# Patient Record
Sex: Female | Born: 1950
Health system: Southern US, Community
[De-identification: ages and names within clinical notes are randomized; demographics above are authoritative.]

## PROBLEM LIST (undated history)

## (undated) DIAGNOSIS — M869 Osteomyelitis, unspecified: Secondary | ICD-10-CM

## (undated) DIAGNOSIS — I509 Heart failure, unspecified: Secondary | ICD-10-CM

## (undated) DIAGNOSIS — M79609 Pain in unspecified limb: Secondary | ICD-10-CM

## (undated) DIAGNOSIS — G2581 Restless legs syndrome: Secondary | ICD-10-CM

## (undated) DIAGNOSIS — D75839 Thrombocytosis, unspecified: Secondary | ICD-10-CM

## (undated) DIAGNOSIS — N189 Chronic kidney disease, unspecified: Secondary | ICD-10-CM

## (undated) DIAGNOSIS — I700 Atherosclerosis of aorta: Secondary | ICD-10-CM

## (undated) DIAGNOSIS — Z1589 Genetic susceptibility to other disease: Secondary | ICD-10-CM

## (undated) DIAGNOSIS — D473 Essential (hemorrhagic) thrombocythemia: Secondary | ICD-10-CM

## (undated) DIAGNOSIS — K5790 Diverticulosis of intestine, part unspecified, without perforation or abscess without bleeding: Secondary | ICD-10-CM

## (undated) DIAGNOSIS — D509 Iron deficiency anemia, unspecified: Secondary | ICD-10-CM

## (undated) DIAGNOSIS — E119 Type 2 diabetes mellitus without complications: Secondary | ICD-10-CM

## (undated) DIAGNOSIS — M1990 Unspecified osteoarthritis, unspecified site: Secondary | ICD-10-CM

## (undated) DIAGNOSIS — L409 Psoriasis, unspecified: Secondary | ICD-10-CM

## (undated) DIAGNOSIS — I8390 Asymptomatic varicose veins of unspecified lower extremity: Secondary | ICD-10-CM

## (undated) DIAGNOSIS — M797 Fibromyalgia: Secondary | ICD-10-CM

## (undated) DIAGNOSIS — I2510 Atherosclerotic heart disease of native coronary artery without angina pectoris: Secondary | ICD-10-CM

## (undated) DIAGNOSIS — I255 Ischemic cardiomyopathy: Secondary | ICD-10-CM

## (undated) DIAGNOSIS — G4700 Insomnia, unspecified: Secondary | ICD-10-CM

## (undated) DIAGNOSIS — Z87442 Personal history of urinary calculi: Secondary | ICD-10-CM

## (undated) DIAGNOSIS — I10 Essential (primary) hypertension: Secondary | ICD-10-CM

## (undated) HISTORY — DX: Insomnia, unspecified: G47.00

## (undated) HISTORY — PX: EYE SURGERY: SHX253

## (undated) HISTORY — PX: BACK SURGERY: SHX140

## (undated) HISTORY — DX: Thrombocytosis, unspecified: D75.839

## (undated) HISTORY — DX: Essential (primary) hypertension: I10

## (undated) HISTORY — DX: Diverticulosis of intestine, part unspecified, without perforation or abscess without bleeding: K57.90

## (undated) HISTORY — DX: Genetic susceptibility to other disease: Z15.89

## (undated) HISTORY — PX: COLONOSCOPY: SHX174

## (undated) HISTORY — DX: Unspecified osteoarthritis, unspecified site: M19.90

## (undated) HISTORY — DX: Atherosclerosis of aorta: I70.0

## (undated) HISTORY — DX: Asymptomatic varicose veins of unspecified lower extremity: I83.90

## (undated) HISTORY — DX: Restless legs syndrome: G25.81

## (undated) HISTORY — DX: Chronic kidney disease, unspecified: N18.9

## (undated) HISTORY — PX: FRACTURE SURGERY: SHX138

## (undated) HISTORY — DX: Iron deficiency anemia, unspecified: D50.9

## (undated) HISTORY — DX: Pain in unspecified limb: M79.609

## (undated) HISTORY — PX: TUBAL LIGATION: SHX77

## (undated) HISTORY — DX: Fibromyalgia: M79.7

## (undated) HISTORY — DX: Psoriasis, unspecified: L40.9

---

## 1984-03-20 HISTORY — PX: BACK SURGERY: SHX140

## 1997-10-15 ENCOUNTER — Emergency Department (HOSPITAL_COMMUNITY): Admission: EM | Admit: 1997-10-15 | Discharge: 1997-10-15 | Payer: Self-pay | Admitting: Emergency Medicine

## 1997-11-13 ENCOUNTER — Other Ambulatory Visit: Admission: RE | Admit: 1997-11-13 | Discharge: 1997-11-13 | Payer: Self-pay | Admitting: Obstetrics and Gynecology

## 1999-02-03 ENCOUNTER — Other Ambulatory Visit: Admission: RE | Admit: 1999-02-03 | Discharge: 1999-02-03 | Payer: Self-pay | Admitting: Obstetrics and Gynecology

## 1999-03-31 ENCOUNTER — Other Ambulatory Visit: Admission: RE | Admit: 1999-03-31 | Discharge: 1999-03-31 | Payer: Self-pay | Admitting: Gastroenterology

## 1999-03-31 ENCOUNTER — Encounter (INDEPENDENT_AMBULATORY_CARE_PROVIDER_SITE_OTHER): Payer: Self-pay | Admitting: Specialist

## 2000-03-01 ENCOUNTER — Other Ambulatory Visit: Admission: RE | Admit: 2000-03-01 | Discharge: 2000-03-01 | Payer: Self-pay | Admitting: Obstetrics and Gynecology

## 2000-09-13 ENCOUNTER — Encounter (INDEPENDENT_AMBULATORY_CARE_PROVIDER_SITE_OTHER): Payer: Self-pay | Admitting: *Deleted

## 2000-09-13 ENCOUNTER — Ambulatory Visit (HOSPITAL_BASED_OUTPATIENT_CLINIC_OR_DEPARTMENT_OTHER): Admission: RE | Admit: 2000-09-13 | Discharge: 2000-09-13 | Payer: Self-pay | Admitting: Orthopedic Surgery

## 2002-05-01 ENCOUNTER — Ambulatory Visit (HOSPITAL_COMMUNITY): Admission: RE | Admit: 2002-05-01 | Discharge: 2002-05-01 | Payer: Self-pay | Admitting: Urology

## 2002-05-01 ENCOUNTER — Encounter: Payer: Self-pay | Admitting: Urology

## 2003-01-26 ENCOUNTER — Other Ambulatory Visit: Admission: RE | Admit: 2003-01-26 | Discharge: 2003-01-26 | Payer: Self-pay | Admitting: Internal Medicine

## 2004-02-26 ENCOUNTER — Encounter: Admission: RE | Admit: 2004-02-26 | Discharge: 2004-02-26 | Payer: Self-pay

## 2004-10-11 ENCOUNTER — Ambulatory Visit: Payer: Self-pay | Admitting: Gastroenterology

## 2004-10-26 ENCOUNTER — Encounter (INDEPENDENT_AMBULATORY_CARE_PROVIDER_SITE_OTHER): Payer: Self-pay | Admitting: Specialist

## 2004-10-26 ENCOUNTER — Ambulatory Visit: Payer: Self-pay | Admitting: Gastroenterology

## 2005-08-08 ENCOUNTER — Emergency Department (HOSPITAL_COMMUNITY): Admission: EM | Admit: 2005-08-08 | Discharge: 2005-08-08 | Payer: Self-pay | Admitting: *Deleted

## 2009-08-10 ENCOUNTER — Encounter: Payer: Self-pay | Admitting: Internal Medicine

## 2009-11-17 ENCOUNTER — Encounter (INDEPENDENT_AMBULATORY_CARE_PROVIDER_SITE_OTHER): Payer: Self-pay | Admitting: *Deleted

## 2010-01-31 ENCOUNTER — Encounter (INDEPENDENT_AMBULATORY_CARE_PROVIDER_SITE_OTHER): Payer: Self-pay | Admitting: *Deleted

## 2010-03-09 ENCOUNTER — Encounter (INDEPENDENT_AMBULATORY_CARE_PROVIDER_SITE_OTHER): Payer: Self-pay | Admitting: *Deleted

## 2010-03-15 ENCOUNTER — Ambulatory Visit: Payer: Self-pay | Admitting: Gastroenterology

## 2010-03-20 DIAGNOSIS — K635 Polyp of colon: Secondary | ICD-10-CM

## 2010-03-20 HISTORY — DX: Polyp of colon: K63.5

## 2010-03-28 ENCOUNTER — Encounter: Payer: Self-pay | Admitting: Gastroenterology

## 2010-03-28 ENCOUNTER — Ambulatory Visit
Admission: RE | Admit: 2010-03-28 | Discharge: 2010-03-28 | Payer: Self-pay | Source: Home / Self Care | Attending: Gastroenterology | Admitting: Gastroenterology

## 2010-04-01 ENCOUNTER — Encounter: Payer: Self-pay | Admitting: Gastroenterology

## 2010-04-19 NOTE — Letter (Signed)
Summary: Pre Visit Letter Revised  [facility]  [address] [place], [place] [address]   Phone: [phone]  Fax: [phone]        01/31/2010 MRN: [id number]    [facility] [address] [place], [place]  [address]             Procedure Date:  March 28, 2010 AT 8:30 A.M. (ARRIVAL TIME: 7:30 A.M.)  Welcome to the Gastroenterology Division at [facility].    You are scheduled to see a nurse for your pre-procedure visit on TUESDAY, 03/15/2010 at 10:00 A.M. on the 3rd floor at [facility], [address]  We ask that you try to arrive at our office 15 minutes prior to your appointment time to allow for check-in.  Please take a minute to review the attached form.  If you answer "Yes" to one or more of the questions on the first page, we ask that you call the person listed at your earliest opportunity.  If you answer "No" to all of the questions, please complete the rest of the form and bring it to your appointment.    Your nurse visit will consist of discussing your medical and surgical history, your immediate family medical history, and your medications.   If you are unable to list all of your medications on the form, please bring the medication bottles to your appointment and we will list them.  We will need to be aware of both prescribed and over the counter drugs.  We will need to know exact dosage information as well.    Please be prepared to read and sign documents such as consent forms, a financial agreement, and acknowledgement forms.  If necessary, and with your consent, a friend or relative is welcome to sit-in on the nurse visit with you.  Please bring your insurance card so that we may make a copy of it.  If your insurance requires a referral to see a specialist, please bring your referral form from your primary care physician.  No co-pay is required for this nurse visit.     If you cannot keep your appointment, please call [phone] to  cancel or reschedule prior to your appointment date.  This allows [place] the opportunity to schedule an appointment for another patient in need of care.    Thank you for choosing [facility] for your medical needs.  We appreciate the opportunity to care for you.  Please visit [place] at our website  to learn more about our practice.  Sincerely, The Gastroenterology Division

## 2010-04-19 NOTE — Letter (Signed)
Summary: Colonoscopy Letter   Gastroenterology  [address] [place], [place] [address]   Phone: [phone]  Fax: [phone]      November 17, 2009 MRN: [id number]   PANDA CROSSIN [address] [place], [place]  [address]   Dear Ms. Orona,   According to your medical record, it is time for you to schedule a Colonoscopy. The American Cancer Society recommends this procedure as a method to detect early colon cancer. Patients with a family history of colon cancer, or a personal history of colon polyps or inflammatory bowel disease are at increased risk.  This letter has beeen generated based on the recommendations made at the time of your procedure. If you feel that in your particular situation this may no longer apply, please contact our office.  Please call our office at [phone] to schedule this appointment or to update your records at your earliest convenience.  Thank you for cooperating with [place] to provide you with the very best care possible.   Sincerely,    Vania Rea. Jarold Motto, M.D.  [facility] Gastroenterology Division [phone]

## 2010-04-21 NOTE — Letter (Signed)
Summary: [facility] Instructions  [facility]  [address] [place], [place] [address]   Phone: [phone]  Fax: [phone]       Anna [name]    62/02/1951    MRN: [id number]        Procedure Day Dorna Bloom:  Duanne Limerick  03/28/10     Arrival Time:  7:30AM     Procedure Time:  8:30AM     Location of Procedure:                    _ X_  [facility] (4th Floor)                      PREPARATION FOR COLONOSCOPY WITH MOVIPREP   Starting 5 days prior to your procedure 03/23/10 do not eat nuts, seeds, popcorn, corn, beans, peas,  salads, or any raw vegetables.  Do not take any fiber supplements (e.g. Metamucil, Citrucel, and Benefiber).  THE DAY BEFORE YOUR PROCEDURE         DATE: 03/27/10   DAY: SUNDAY  1.  Drink clear liquids the entire day-NO SOLID FOOD  2.  Do not drink anything colored red or purple.  Avoid juices with pulp.  No orange juice.  3.  Drink at least 64 oz. (8 glasses) of fluid/clear liquids during the day to prevent dehydration and help the prep work efficiently.  CLEAR LIQUIDS INCLUDE: Water Jello Ice Popsicles Tea (sugar ok, no milk/cream) Powdered fruit flavored drinks Coffee (sugar ok, no milk/cream) Gatorade Juice: apple, white grape, white cranberry  Lemonade Clear bullion, consomm, broth Carbonated beverages (any kind) Strained chicken noodle soup Hard Candy                             4.  In the morning, mix first dose of MoviPrep solution:    Empty 1 Pouch A and 1 Pouch B into the disposable container    Add lukewarm drinking water to the top line of the container. Mix to dissolve    Refrigerate (mixed solution should be used within 24 hrs)  5.  Begin drinking the prep at 5:00 p.m. The MoviPrep container is divided by 4 marks.   Every 15 minutes drink the solution down to the next mark (approximately 8 oz) until the full liter is complete.   6.  Follow completed prep with 16 oz of clear liquid of your choice (Nothing red or  purple).  Continue to drink clear liquids until bedtime.  7.  Before going to bed, mix second dose of MoviPrep solution:    Empty 1 Pouch A and 1 Pouch B into the disposable container    Add lukewarm drinking water to the top line of the container. Mix to dissolve    Refrigerate  THE DAY OF YOUR PROCEDURE      DATE: 03/28/10   DAY: MONDAY  Beginning at 3:30AM (5 hours before procedure):         1. Every 15 minutes, drink the solution down to the next mark (approx 8 oz) until the full liter is complete.  2. Follow completed prep with 16 oz. of clear liquid of your choice.    3. You may drink clear liquids until 6:30AM (2 HOURS BEFORE PROCEDURE).   MEDICATION INSTRUCTIONS  Unless otherwise instructed, you should take regular prescription medications with a small sip of water   as early as possible the morning  of your procedure.         OTHER INSTRUCTIONS  You will need a responsible adult at least 62 years of age to accompany you and drive you home.   This person must remain in the waiting room during your procedure.  Wear loose fitting clothing that is easily removed.  Leave jewelry and other valuables at home.  However, you may wish to bring a book to read or  an iPod/MP3 player to listen to music as you wait for your procedure to start.  Remove all body piercing jewelry and leave at home.  Total time from sign-in until discharge is approximately 2-3 hours.  You should go home directly after your procedure and rest.  You can resume normal activities the  day after your procedure.  The day of your procedure you should not:   Drive   Make legal decisions   Operate machinery   Drink alcohol   Return to work  You will receive specific instructions about eating, activities and medications before you leave.    The above instructions have been reviewed and explained to me by   Wyona Almas RN  March 15, 2010 10:31 AM     I fully understand and can  verbalize these instructions _____________________________ Date _________

## 2010-04-21 NOTE — Letter (Signed)
Summary: Patient Notice- Polyp Results  [facility]  [address] [place], [place] [address]   Phone: [phone]  Fax: [phone]        April 01, 2010 MRN: [id number]    [facility] [address] [place], [place]  [address]    Dear Ms. Kroner,  I am pleased to inform you that the colon polyp(s) removed during your recent colonoscopy was (were) found to be benign (no cancer detected) upon pathologic examination.  I recommend you have a repeat colonoscopy examination in 5_ years to look for recurrent polyps, as having colon polyps increases your risk for having recurrent polyps or even colon cancer in the future.  Should you develop new or worsening symptoms of abdominal pain, bowel habit changes or bleeding from the rectum or bowels, please schedule an evaluation with either your primary care physician or with me.  Additional information/recommendations:  _X_ No further action with gastroenterology is needed at this time. Please      follow-up with your primary care physician for your other healthcare      needs.  __ Please call [phone] to schedule a return visit to review your      situation.  __ Please keep your follow-up visit as already scheduled.  __ Continue treatment plan as outlined the day of your exam.  Please call us if you are having persistent problems or have questions about your condition that have not been fully answered at this time.  Sincerely,  Mardella Layman MD [facility]  This letter has been electronically signed by your physician.  Appended Document: Patient Notice- Polyp Results Letter mailed

## 2010-04-21 NOTE — Procedures (Signed)
Summary: Colonoscopy  Patient: Anna [name] Note: All result statuses are Final unless otherwise noted.  Tests: (1) Colonoscopy (COL)   COL Colonoscopy           DONE     [facility]     [address]     [place], [place]  [address]           COLONOSCOPY PROCEDURE REPORT           PATIENT:  [name], [name]  MR#:  [id number]     BIRTHDATE:  [birth year], 59 yrs. old  GENDER:  female     ENDOSCOPIST:  Vania Rea. Jarold Motto, MD, [facility]     REF. BY:  Oliver Barre, M.D.     PROCEDURE DATE:  03/28/2010     PROCEDURE:  Colonoscopy with biopsy     ASA CLASS:  Class I     INDICATIONS:  Colorectal cancer screening, average risk     MEDICATIONS:   Fentanyl 50 mcg IV, Versed 8 mg IV           DESCRIPTION OF PROCEDURE:   After the risks benefits and     alternatives of the procedure were thoroughly explained, informed     consent was obtained.  Digital rectal exam was performed and     revealed no abnormalities.   The LB 180AL [id number] endoscope was     introduced through the anus and advanced to the cecum, which was     identified by both the appendix and ileocecal valve, without     limitations.  The quality of the prep was excellent, using     MoviPrep.  The instrument was then slowly withdrawn as the colon     was fully examined.     <<PROCEDUREIMAGES>>           FINDINGS:  Mild diverticulosis was found in the sigmoid to     descending colon segments.  A sessile polyp was found. 4mm flat     sigmoid polyp cold BX. removed.see pictures.   Retroflexed views     in the rectum revealed no abnormalities.    The scope was then     withdrawn from the patient and the procedure completed.           COMPLICATIONS:  None     ENDOSCOPIC IMPRESSION:     1) Mild diverticulosis in the sigmoid to descending colon     segments     2) Sessile polyp     R/O ADENOMA.     RECOMMENDATIONS:     1) Repeat Colonoscopy in 5 years.     2) high fiber diet     REPEAT EXAM:  No        ______________________________     Vania Rea. Jarold Motto, MD, [facility]           CC:           n.     eSIGNED:   Vania Rea. Bobbiejo Ishikawa at 03/28/2010 09:07 AM           Ludwig Clarks, [id number]  Note: An exclamation mark (!) indicates a result that was not dispersed into the flowsheet. Document Creation Date: 03/28/2010 9:08 AM _______________________________________________________________________  (1) Order result status: Final Collection or observation date-time: 03/28/2010 08:59 Requested date-time:  Receipt date-time:  Reported date-time:  Referring Physician:   Ordering Physician: Sheryn Bison [phone]) Specimen Source:  Source: Launa Grill Order Number: [id number] Lab site:   Appended Document: Colonoscopy  Procedures Next Due Date:    Colonoscopy: 03/2015

## 2010-04-21 NOTE — Miscellaneous (Signed)
Summary: [facility] Previsit/prep  Clinical Lists Changes  Medications: Added new medication of MOVIPREP 100 GM  SOLR (PEG-KCL-NACL-NASULF-NA ASC-C) As per prep instructions. - Signed Rx of MOVIPREP 100 GM  SOLR (PEG-KCL-NACL-NASULF-NA ASC-C) As per prep instructions.;  #1 x 0;  Signed;  Entered by: Wyona Almas RN;  Authorized by: Mardella Layman MD [facility];  Method used: Electronically to [id number] [address] [place], [place]  [address], Ph: [phone], Fax: [phone] Observations: Added new observation of NKA: T (03/15/2010 9:55)    Prescriptions: MOVIPREP 100 GM  SOLR (PEG-KCL-NACL-NASULF-NA ASC-C) As per prep instructions.  #1 x 0   Entered by:   Wyona Almas RN   Authorized by:   Mardella Layman MD [facility]   Signed by:   Wyona Almas RN on 03/15/2010   Method used:   Electronically to        [id number] (retail)       [address] [place], [place]  [address]       Ph: [phone]       Fax: [phone]   RxID:   [id number]

## 2010-06-20 ENCOUNTER — Encounter (INDEPENDENT_AMBULATORY_CARE_PROVIDER_SITE_OTHER): Payer: BC Managed Care – PPO | Admitting: Vascular Surgery

## 2010-06-20 ENCOUNTER — Encounter (INDEPENDENT_AMBULATORY_CARE_PROVIDER_SITE_OTHER): Payer: BC Managed Care – PPO

## 2010-06-20 DIAGNOSIS — I83893 Varicose veins of bilateral lower extremities with other complications: Secondary | ICD-10-CM

## 2010-06-21 NOTE — Consult Note (Signed)
NEW PATIENT CONSULTATION  Anna [name], Anna [name] DOB:  1950/06/27                                       06/20/2010 JWJXB#:[id number]  The patient is a 60 year old female who is referred for severe venous insufficiency of the right leg.  She states she has been having severe aching, throbbing, burning and cramping discomfort in both legs, right worse than left, for the last 20 years or so but it has been becoming much more significant.  She develops swelling in the ankles as the day progresses with increasing pain.  She has worn some short leg stockings in the past but this has not relieved her symptoms.  She elevates her legs at night but also develops symptoms at night.  She has had no previous evaluation or treatment of her veins.  Right leg is much worse than the left.  She has no history of DVT, thrombophlebitis, stasis ulcers or bleeding but has noted the skin becoming much more scaly and dark in the right ankle area.  CHRONIC MEDICAL PROBLEMS: 1. Fibromyalgia. 2. Negative for diabetes, coronary artery disease, hypertension,     hyperlipidemia, COPD or stroke.  SOCIAL HISTORY:  She is married, has 2 children.  Smokes 3 cigarettes per day.  Does not use alcohol.  FAMILY HISTORY:  Positive for varicose veins in her mother and father, coronary artery disease in her father plus diabetes in an aunt; negative for stroke.  REVIEW OF SYSTEMS:  Positive for weight gain, leg discomfort with walking, arthritis joint pain, muscle pain.  All other systems in complete review of systems are negative.  PHYSICAL EXAM:  Vital signs:  Blood pressure 146/96, heart rate 84, respirations 16.  General:  She is a well-developed, well-nourished female in no apparent distress, alert and oriented x3.  HEENT:  Exam normal for age.  EOMs intact.  Lungs:  Clear to auscultation.  No rhonchi or wheezing.  Cardiovascular:  Reveals a regular rhythm.  No murmurs.  Carotid pulses are 3+.  No  audible bruits.  Abdomen:  Soft, nontender with no masses.  Musculoskeletal:  Free of major deformities. Neurological:  Normal.  Skin:  Free of rashes.  There are bulging varicosities in the right leg along the course of the great saphenous system beginning in the mid to distal thigh extending into the medial calf anteriorly into the pretibial region and laterally as well as multiple reticular and spider veins in the medial malleolar area beginning in the lower third of the leg.  The skin is thickened but no ulcerations are noted.  The left leg has some spider veins in the anterior thigh laterally and medially but no varicosities noted.  There is 1+ edema on the right, no edema on the left.  She has 2+ posterior tibial pulses bilaterally.  Today I ordered a venous duplex exam which I reviewed and interpreted. There is mild reflux in the deep systems bilaterally but no DVT.  Right leg has gross reflux in the right great saphenous systems from the saphenofemoral junction to the calf area.  Left leg has some reflux at the left saphenofemoral junction but none throughout the remaining great saphenous vein which is of normal size.  This patient does have severe venous insufficiency with valvular incompetence in the right great saphenous system with gross reflux causing her painful varicosities and skin changes.  We will  initially treat her with long leg elastic compression stockings (20 mm - 30 mm gradient) as well as elevation of the legs and ibuprofen on a regular basis.  She will return in 3 months and if there has been no dramatic improvement she should have 1) laser ablation of the right great saphenous vein with multiple stab phlebectomies, 2) two courses of sclerotherapy for the multiple reticular veins in the pretibial area and the right ankle area.  She will return in 3 months.    Quita Skye Hart [place], M.D. Electronically Signed  JDL/MEDQ  D:  06/20/2010  T:  06/21/2010  Job:   [id number]

## 2010-06-27 NOTE — Procedures (Unsigned)
LOWER EXTREMITY VENOUS REFLUX EXAM  INDICATION:  Bilateral varicose veins.  EXAM:  Using color-flow imaging and pulse Doppler spectral analysis, the right and left common femoral, femoral, popliteal, posterior tibial, great and small saphenous veins are evaluated.  There is evidence suggesting deep venous insufficiency in the right and left lower extremity at the level of the common femoral vein.  The right and left saphenofemoral junction is not competent with reflux of >524milliseconds. The right greater saphenous vein is not competent with reflux of >522milliseconds with the caliber as described below. The left great saphenous vein is competent.  The right and left proximal small saphenous vein demonstrate competency.  GSV Diameter (used if found to be incompetent only)                                           Right    Left Proximal Greater Saphenous Vein           1.27 cm  0.75 cm Proximal-to-mid-thigh                     0.98 cm  0.31 cm Mid thigh                                 0.71 cm  0.15 cm Mid-distal thigh                          cm       cm Distal thigh                              1.35 cm  0.28 cm Knee                                      1.07 cm  0.20 cm   IMPRESSION: 1. Right great saphenous vein is not competent with reflux of     >515milliseconds. 2. The right great saphenous vein is tortuous below the knee. 3. The deep venous system is not competent with reflux of >500     milliseconds. 4. The right and left small saphenous vein is competent.        ___________________________________________ Quita Skye. Hart Rochester, M.D.  EM/MEDQ  D:  06/22/2010  T:  06/22/2010  Job:  [id number]

## 2010-08-05 NOTE — H&P (Signed)
[place]  Patient:    Anna [name], Anna [name]                       MRN: [id number] Adm. Date:  [id number] Attending:  Ronne Binning CC:         Nicki Reaper, M.D. (2 copies)   History and Physical  PREOPERATIVE DIAGNOSIS:  Mucoid cyst, left index finger and left thumb.  POSTOPERATIVE DIAGNOSIS:  Mucoid cyst, left index finger and left thumb.  OPERATION:  Excision of mucoid cyst and debridement of distal interphalangeal joint, interphalangeal joint left index and left thumb.  SURGEON:  Nicki Reaper, M.D.  ASSISTANT:  Joaquin Courts, R.N.  ANESTHESIA:  Forearm-based IV regional.  ANESTHESIOLOGIST:  Janetta Hora. Gelene Mink, M.D.  HISTORY:  The patient is a 60 year old female with a history of mass on the IP joint of her thumb and distal interphalangeal joint of her index finger, left hand.  DESCRIPTION OF PROCEDURE:  The patient was brought to the operating room where a forearm-based IV regional anesthetic was carried out without difficulty. She was prepped and draped using Betadine scrub and solution with the left arm free.  A curvilinear incision was made over the IP joint of her thumb and carried down through subcutaneous tissue.  Bleeders were electrocauterized. The dissection was carried down to the interphalangeal joint.  Mass was immediately encountered.  With blunt and sharp dissection, this was dissected free.  The incision was extended to reveal the entire cyst which was removed in toto.  The joint was opened, debrided with a rongeur.  The wound was irrigated.  Skin was closed with interrupted 5-0 nylon sutures.  A separate incision was then made on the index finger at the distal interphalangeal joint, curvilinear in nature.  Carried down through subcutaneous tissue, neurovascular structures protected.  Veins were electrocauterized.  The multiple cysts were encountered.  These were removed without difficulty.  The joint was opened.   Osteophytes were removed with small rongeur.  The wound was again irrigated and skin closed with interrupted 5-0 nylon sutures.  Sterile compressive dressing was applied to each digit. The patient tolerated the procedure well and was taken to the recovery room for observation in satisfactory condition.  She is discharged home to return to the [facility] [place] in one week on Vicodin and Keflex. DD:  09/13/00 TD:  09/13/00 Job: [id number] LKG/MW102

## 2010-08-29 ENCOUNTER — Encounter: Payer: Self-pay | Admitting: Internal Medicine

## 2010-09-26 ENCOUNTER — Ambulatory Visit: Payer: BC Managed Care – PPO | Admitting: Vascular Surgery

## 2010-11-10 ENCOUNTER — Encounter: Payer: Self-pay | Admitting: Vascular Surgery

## 2010-11-25 ENCOUNTER — Encounter: Payer: Self-pay | Admitting: Vascular Surgery

## 2010-11-28 ENCOUNTER — Ambulatory Visit: Payer: BC Managed Care – PPO | Admitting: Vascular Surgery

## 2011-01-13 ENCOUNTER — Encounter: Payer: Self-pay | Admitting: Vascular Surgery

## 2011-01-16 ENCOUNTER — Encounter: Payer: Self-pay | Admitting: Vascular Surgery

## 2011-01-16 ENCOUNTER — Ambulatory Visit (INDEPENDENT_AMBULATORY_CARE_PROVIDER_SITE_OTHER): Payer: BC Managed Care – PPO | Admitting: Vascular Surgery

## 2011-01-16 VITALS — BP 180/83 | HR 66 | Resp 16 | Ht 64.0 in | Wt 180.5 lb

## 2011-01-16 DIAGNOSIS — I83893 Varicose veins of bilateral lower extremities with other complications: Secondary | ICD-10-CM

## 2011-01-16 NOTE — Progress Notes (Signed)
Subjective:     Patient ID: Anna [name], female   DOB: 06-03-1950, 60 y.o.   MRN: [id number]  HPI this 60 year old female returns today for further evaluation of her severe venous insufficiency of the right leg. She has had aching throbbing burning and cramping discomfort in both legs for the past 20 years but these have worsened significantly over the last few years. Right leg is worse in the left. She has worn a long-leg elastic compression stockings 20-30 mm gradient is tried elevating her legs and taking ibuprofen on a daily basis with no improvement in her symptomatology. She states the symptoms are worse than there were earlier when I evaluated her in April of 2012. She has no history of DVT thrombophlebitis stasis ulcers or bleeding. She has continued to notice that the skin has become increasingly dark and more scaly she also had some bleeding in a prominent vein near her right ankle area recently.  Past Medical History  Diagnosis Date  . Fibromyalgia   . Pain in limb   . Arthritis   . Varicose veins     History  Substance Use Topics  . Smoking status: Current Everyday Smoker -- 0.2 packs/day    Types: Cigarettes  . Smokeless tobacco: Not on file  . Alcohol Use: No    Family History  Problem Relation Age of Onset  . Heart disease Father   . Diabetes Paternal Aunt     No Known Allergies  Current outpatient prescriptions:Calcium-Vitamin D (CALTRATE 600 PLUS-VIT D PO), Take by mouth. Two tabs qd , Disp: , Rfl: ;  glucosamine-chondroitin 500-400 MG tablet, Take 1 tablet by mouth 2 (two) times daily. 1000 mg BID , Disp: , Rfl: ;  Naproxen Sodium (ALEVE) 220 MG CAPS, Take by mouth. 1-2 bid , Disp: , Rfl:   BP 180/83  Pulse 66  Resp 16  Ht 5\' 4"  (1.626 m)  Wt 180 lb 8 oz (81.874 kg)  BMI 30.98 kg/m2  Body mass index is 30.98 kg/(m^2).         Review of Systems     Objective:   Physical Exam blood pressure 180/83 heart rate 66 respirations 16 General she is  well-developed well-nourished female no apparent stress alert and oriented x3 Lower extremity exam right leg has 3+ femoral popliteal dorsalis pedis pulse palpable. She has large bulging varicosities from distal thigh to the ankle of the great saphenous system extending anteriorly into the pretibial region with multiple reticular and spider veins extending down into the right ankle particularly medially. There is 1+ edema and hyperpigmentation developing. Left leg has some smaller varicosities in the medial thigh and calf and some early reticular and spider veins.     Assessment:    #1 gross reflux right great saphenous vein with secondary bulging varicosities which have become increasingly symptomatic despite optimal medical management with a lasting compression stockings, elevation, and ibuprofen. This is affecting her daily living.    Plan:    we should proceed with #1 laser ablation right great saphenous vein with greater than 20 stab phlebectomy a single procedure #2-2 courses of sclerotherapy right leg-1 concentrating on ankle area where bleeding occurred We'll proceed with pre-certification to perform this in the near future

## 2011-02-08 ENCOUNTER — Encounter: Payer: Self-pay | Admitting: Vascular Surgery

## 2011-02-13 ENCOUNTER — Other Ambulatory Visit: Payer: Self-pay | Admitting: *Deleted

## 2011-02-13 ENCOUNTER — Ambulatory Visit (INDEPENDENT_AMBULATORY_CARE_PROVIDER_SITE_OTHER): Payer: BC Managed Care – PPO | Admitting: Vascular Surgery

## 2011-02-13 ENCOUNTER — Encounter: Payer: Self-pay | Admitting: Vascular Surgery

## 2011-02-13 VITALS — BP 204/83 | HR 69 | Resp 18 | Ht 64.0 in | Wt 180.0 lb

## 2011-02-13 DIAGNOSIS — I83893 Varicose veins of bilateral lower extremities with other complications: Secondary | ICD-10-CM

## 2011-02-13 NOTE — Progress Notes (Signed)
Laser Ablation Procedure      Date: 02/13/2011    Anna [name] DOB:21-Nov-1950  Consent signed: Yes  Surgeon:J.D. Hart Rochester  Procedure: Laser Ablation: right Greater Saphenous Vein  BP 204/83  Pulse 69  Resp 18  Ht 5\' 4"  (1.626 m)  Wt 180 lb (81.647 kg)  BMI 30.90 kg/m2  Start time: 9:15AM   End time: 10:45AM  Tumescent Anesthesia: 475 cc 0.9% NaCl with 50 cc Lidocaine HCL with 1% Epi and 15 cc 8.4% NaHCO3  Local Anesthesia: 16 cc Lidocaine HCL and NaHCO3 (ratio 2:1)  Pulsed Mode 15 watts  1 second   1 pulse  1    Total Pulses  124   Total Energy  1821 Joules   Total Time 2 :01   Stab Phlebectomy: >20 Sites: Thigh. calf  Patient tolerated procedure well: yes  Rankin, Neena Rhymes Description of Procedure:  After marking the course of the saphenous vein and the secondary varicosities in the standing position, the patient was placed on the operating table in the supine position, and the right leg was prepped and draped in sterile fashion. Local anesthetic was administered, and under ultrasound guidance the saphenous vein was accessed with a micro needle and guide wire; then the micro puncture sheath was placed. A guide wire was inserted to the saphenofemoral junction, followed by a 5 french sheath.  The position of the sheath and then the laser fiber below the junction was confirmed using the ultrasound and visualization of the aiming beam.  Tumescent anesthesia was administered along the course of the saphenous vein using ultrasound guidance. Protective laser glasses were placed on the patient, and the laser was fired at 15 watt pulsed mode advancing 1-2 mm per sec.  For a total of 1821 joules.  A steri strip was applied to the puncture site.  The patient was then put into Trendelenburg position.  Local anesthetic was utilized overlying the marked varicosities.  Greater than >20 stab wounds were made using the tip of an 11 blade; and using the vein hook,  The phlebectomies were  performed using a hemostat to avulse these varicosities.  Adequate hemostasis was achieved, and steri strips were applied to the stab wound.    ABD pads and thigh high compression stockings were applied.  Ace wrap bandages were applied over the phlebectomy sites and at the top of the saphenofemoral junction.  Blood loss was less than 15 cc.  The patient ambulated out of the operating room having tolerated the procedure well.

## 2011-02-13 NOTE — Progress Notes (Deleted)
Patient ID: Anna [name], female   DOB: 1950-10-01, 60 y.o.   MRN: [id number]

## 2011-02-13 NOTE — Progress Notes (Signed)
Subjective:     Patient ID: Anna [name], female   DOB: 05-11-1950, 61 y.o.   MRN: [id number]  HPI   Review of Systems     Objective:   Physical Exam     Assessment:     Successful laser ablation right great saphenous vein with greater than 20 stab phlebectomy done under local tumescent anesthesia. Patient tolerated procedure well.    Plan:     To return in one week for venous duplex exam to confirm closure right great saphenous vein. Will be scheduled for 2 sessions of sclerotherapy in the near future.

## 2011-02-14 ENCOUNTER — Telehealth: Payer: Self-pay | Admitting: *Deleted

## 2011-02-14 NOTE — Telephone Encounter (Signed)
Reached patient at home. She had a bit of a rough night. Had trouble getting comfortable. Doing better this am. She had forgotten about ice and will start that now. Taking Ibuprofen as directed. Reminded her of her fu appts.

## 2011-02-17 ENCOUNTER — Encounter: Payer: Self-pay | Admitting: Vascular Surgery

## 2011-02-20 ENCOUNTER — Encounter: Payer: Self-pay | Admitting: Vascular Surgery

## 2011-02-20 ENCOUNTER — Ambulatory Visit (INDEPENDENT_AMBULATORY_CARE_PROVIDER_SITE_OTHER): Payer: BC Managed Care – PPO | Admitting: Vascular Surgery

## 2011-02-20 VITALS — BP 148/83 | HR 72 | Resp 20 | Ht 64.0 in | Wt 175.0 lb

## 2011-02-20 DIAGNOSIS — I83893 Varicose veins of bilateral lower extremities with other complications: Secondary | ICD-10-CM

## 2011-02-20 DIAGNOSIS — M79609 Pain in unspecified limb: Secondary | ICD-10-CM

## 2011-02-20 NOTE — Progress Notes (Signed)
RLE venous duplex perfomred @ [facility] 02/20/2011 follow up [facility]

## 2011-02-20 NOTE — Progress Notes (Signed)
Subjective:     Patient ID: Anna [name], female   DOB: [birth year], 60 y.o.   MRN: [id number]  HPI this 60 year old female returns 1 week post laser ablation right great saphenous vein with greater than 20 stab phlebectomy for painful varicosities she has had some mild to moderate discomfort along the course of the great saphenous vein from the knee to the groin. She has noticed decreased edema in the right ankle and decreased bluish discoloration which he had preoperatively. She has no pain associated with stab phlebectomy sites.  Today I ordered a venous duplex exam which are reviewed and interpreted. There is no DVT in the right leg. The great saphenous vein is totally occluded from the knee to the saphenofemoral junction.   Review of Systems     Objective:   Physical ExamBP 148/83  Pulse 72  Resp 20  Ht 5\' 4"  (1.626 m)  Wt 175 lb (79.379 kg)  BMI 30.04 kg/m2   examination of the right lower extremity will reveals some mild to moderate along the course of the great saphenous vein from the saphenofemoral junction to the knee. There some mild ecchymosis. Multiple stab phlebectomy sites in the calf and medial thigh or healing nicely with Steri-Strips in place. There is no distal edema Assessment:     Doing well post laser ablation right great saphenous vein with multiple stab phlebectomy for painful varicosities    Plan:    return in January for sclerotherapy

## 2011-03-17 ENCOUNTER — Encounter: Payer: Self-pay | Admitting: Vascular Surgery

## 2011-04-04 ENCOUNTER — Encounter: Payer: Self-pay | Admitting: Vascular Surgery

## 2011-04-04 ENCOUNTER — Encounter: Payer: Self-pay | Admitting: *Deleted

## 2011-04-04 ENCOUNTER — Ambulatory Visit (INDEPENDENT_AMBULATORY_CARE_PROVIDER_SITE_OTHER): Payer: BC Managed Care – PPO | Admitting: *Deleted

## 2011-04-04 DIAGNOSIS — I781 Nevus, non-neoplastic: Secondary | ICD-10-CM

## 2011-04-04 NOTE — Progress Notes (Signed)
X=.3% Sotradecol administered with a 27g butterfly.  Patient received a total of 12cc foam.  Photos: archived  Compression stockings applied: yes  Injected all areas of concern. Easy access and patient tolerated procedure well. Will fu with one more ins covered treatment is necessary.

## 2011-04-12 NOTE — Procedures (Unsigned)
DUPLEX DEEP VENOUS EXAM - LOWER EXTREMITY  INDICATION:  Varicose vein, right lower extremity pain  HISTORY:  Edema:  Yes Trauma/Surgery:  Right GSV endovenous laser ablation 02/13/2011 Pain:  Yes PE:  No Previous DVT:  No Anticoagulants:  Unknown Other:  DUPLEX EXAM:               CFV   SFV   PopV  PTV    GSV               R  L  R  L  R  L  R   L  R  L Thrombosis    o  o  o     o     o      + Spontaneous   +  +  +     +     +      o Phasic        +  +  +     +     +      o Augmentation  +  +  +     +     +      o Compressible  +  +  +     +     +      o Competent     +  +  +     +     +  Legend:  + - yes  o - no  p - partial  D - decreased  IMPRESSION: 1. No evidence of deep venous thrombus identified involving the right     lower extremity. 2. Good post ablation result the length of the right great saphenous     vein ablated. 3. Contralateral common femoral vein is patent and compressible.   _____________________________ Quita Skye. Hart Rochester, M.D.  SH/MEDQ  D:  02/20/2011  T:  02/20/2011  Job:  [id number]

## 2011-05-08 ENCOUNTER — Ambulatory Visit: Payer: BC Managed Care – PPO | Admitting: *Deleted

## 2011-05-11 ENCOUNTER — Telehealth: Payer: Self-pay | Admitting: *Deleted

## 2011-05-11 ENCOUNTER — Ambulatory Visit: Payer: BC Managed Care – PPO | Admitting: *Deleted

## 2011-05-11 NOTE — Telephone Encounter (Signed)
Spoke to Mrs. Legault and informed her that Clementeen Hoof RN was ill today and not coming in to work ,so Mrs. Embree's sclerotherapy appointment for 3:30PM today was cancelled.  Mrs. Neth was very upset by cancellation.  Told Mrs. Saine that [employer] RN would be in contact with her to reschedule the appointment.  Melania Kirks, Neena Rhymes

## 2011-05-16 ENCOUNTER — Ambulatory Visit (INDEPENDENT_AMBULATORY_CARE_PROVIDER_SITE_OTHER): Payer: BC Managed Care – PPO | Admitting: *Deleted

## 2011-05-16 DIAGNOSIS — I83893 Varicose veins of bilateral lower extremities with other complications: Secondary | ICD-10-CM | POA: Insufficient documentation

## 2011-05-16 DIAGNOSIS — I781 Nevus, non-neoplastic: Secondary | ICD-10-CM

## 2011-05-16 NOTE — Progress Notes (Signed)
X=.3% Sotradecol administered with a 27g butterfly.  Patient received a total of 12cc.  Second ins covered sclerotherapy . Treated all remaining small spiders. Anticipate good results.  Photos: yes  Compression stockings applied: yes

## 2011-05-17 ENCOUNTER — Encounter: Payer: Self-pay | Admitting: Vascular Surgery

## 2011-05-17 ENCOUNTER — Ambulatory Visit: Payer: BC Managed Care – PPO | Admitting: *Deleted

## 2011-05-18 ENCOUNTER — Ambulatory Visit: Payer: BC Managed Care – PPO | Admitting: *Deleted

## 2011-08-21 DIAGNOSIS — M25571 Pain in right ankle and joints of right foot: Secondary | ICD-10-CM | POA: Insufficient documentation

## 2011-10-03 ENCOUNTER — Ambulatory Visit (INDEPENDENT_AMBULATORY_CARE_PROVIDER_SITE_OTHER): Payer: BC Managed Care – PPO | Admitting: *Deleted

## 2011-10-03 DIAGNOSIS — I781 Nevus, non-neoplastic: Secondary | ICD-10-CM

## 2011-10-03 NOTE — Progress Notes (Signed)
Patient call ed yesterday because she is not happy with how her legs look post her insurance covered sclero treatments. Had her come in today. Showed her the photos of "before" which show that she is healing as expected. She could benefit from one more treatment so we have scheduled that for next week.

## 2011-10-10 ENCOUNTER — Encounter: Payer: Self-pay | Admitting: *Deleted

## 2011-10-11 ENCOUNTER — Encounter: Payer: Self-pay | Admitting: Vascular Surgery

## 2011-10-11 ENCOUNTER — Ambulatory Visit (INDEPENDENT_AMBULATORY_CARE_PROVIDER_SITE_OTHER): Payer: BC Managed Care – PPO | Admitting: *Deleted

## 2011-10-11 DIAGNOSIS — I781 Nevus, non-neoplastic: Secondary | ICD-10-CM

## 2011-10-11 NOTE — Progress Notes (Signed)
X=.3% Sotradecol administered with a 27g butterfly.  Patient received a total of 11cc.  Treated any remaining areas of concern for this pt. She has had staining develop since the previous treatments. Hydroquinone has helped the areas fade. Hopefully she will be pleased with the results of this treatment. Will follow PRN.  Photos: yes  Compression stockings applied: yes size med.

## 2012-03-27 DIAGNOSIS — M6740 Ganglion, unspecified site: Secondary | ICD-10-CM | POA: Insufficient documentation

## 2012-03-27 DIAGNOSIS — E785 Hyperlipidemia, unspecified: Secondary | ICD-10-CM | POA: Insufficient documentation

## 2012-11-21 DIAGNOSIS — R300 Dysuria: Secondary | ICD-10-CM | POA: Insufficient documentation

## 2012-11-22 ENCOUNTER — Encounter (INDEPENDENT_AMBULATORY_CARE_PROVIDER_SITE_OTHER): Payer: 59 | Admitting: Vascular Surgery

## 2012-11-22 DIAGNOSIS — M79609 Pain in unspecified limb: Secondary | ICD-10-CM

## 2012-11-25 ENCOUNTER — Encounter: Payer: Self-pay | Admitting: Internal Medicine

## 2013-05-05 ENCOUNTER — Inpatient Hospital Stay (HOSPITAL_COMMUNITY): Payer: 59 | Admitting: Anesthesiology

## 2013-05-05 ENCOUNTER — Encounter (HOSPITAL_COMMUNITY): Payer: 59 | Admitting: Anesthesiology

## 2013-05-05 ENCOUNTER — Encounter (HOSPITAL_COMMUNITY): Payer: Self-pay | Admitting: *Deleted

## 2013-05-05 ENCOUNTER — Ambulatory Visit (HOSPITAL_COMMUNITY)
Admission: AD | Admit: 2013-05-05 | Discharge: 2013-05-05 | Disposition: A | Discharge status: Home / Self Care | Payer: 59 | Source: Ambulatory Visit | Attending: Orthopedic Surgery | Admitting: Orthopedic Surgery

## 2013-05-05 ENCOUNTER — Other Ambulatory Visit (HOSPITAL_COMMUNITY): Payer: Self-pay | Admitting: Orthopedic Surgery

## 2013-05-05 ENCOUNTER — Encounter (HOSPITAL_COMMUNITY)
Admission: AD | Disposition: A | Discharge status: Home / Self Care | Payer: Self-pay | Source: Ambulatory Visit | Attending: Orthopedic Surgery

## 2013-05-05 DIAGNOSIS — W010XXA Fall on same level from slipping, tripping and stumbling without subsequent striking against object, initial encounter: Secondary | ICD-10-CM | POA: Insufficient documentation

## 2013-05-05 DIAGNOSIS — S82891A Other fracture of right lower leg, initial encounter for closed fracture: Secondary | ICD-10-CM

## 2013-05-05 DIAGNOSIS — E119 Type 2 diabetes mellitus without complications: Secondary | ICD-10-CM | POA: Insufficient documentation

## 2013-05-05 DIAGNOSIS — S8263XA Displaced fracture of lateral malleolus of unspecified fibula, initial encounter for closed fracture: Secondary | ICD-10-CM | POA: Insufficient documentation

## 2013-05-05 DIAGNOSIS — IMO0001 Reserved for inherently not codable concepts without codable children: Secondary | ICD-10-CM | POA: Insufficient documentation

## 2013-05-05 DIAGNOSIS — F17200 Nicotine dependence, unspecified, uncomplicated: Secondary | ICD-10-CM | POA: Insufficient documentation

## 2013-05-05 HISTORY — PX: ORIF ANKLE FRACTURE: SHX5408

## 2013-05-05 LAB — COMPREHENSIVE METABOLIC PANEL
ALBUMIN: 4.1 g/dL (ref 3.5–5.2)
ALK PHOS: 80 U/L (ref 39–117)
ALT: 27 U/L (ref 0–35)
AST: 23 U/L (ref 0–37)
BILIRUBIN TOTAL: 0.4 mg/dL (ref 0.3–1.2)
BUN: 14 mg/dL (ref 6–23)
CO2: 28 mEq/L (ref 19–32)
Calcium: 9.3 mg/dL (ref 8.4–10.5)
Chloride: 101 mEq/L (ref 96–112)
Creatinine, Ser: 0.7 mg/dL (ref 0.50–1.10)
GFR calc Af Amer: >90 mL/min (ref >90)
GFR calc non Af Amer: 89 mL/min — ABNORMAL LOW (ref >90)
GLUCOSE: 123 mg/dL — AB (ref 70–99)
POTASSIUM: 4 meq/L (ref 3.7–5.3)
Sodium: 142 mEq/L (ref 137–147)
Total Protein: 7.8 g/dL (ref 6.0–8.3)

## 2013-05-05 LAB — APTT: aPTT: 28 seconds (ref 24–37)

## 2013-05-05 LAB — CBC
HEMATOCRIT: 43.3 % (ref 36.0–46.0)
Hemoglobin: 15 g/dL (ref 12.0–15.0)
MCH: 29.8 pg (ref 26.0–34.0)
MCHC: 34.6 g/dL (ref 30.0–36.0)
MCV: 86.1 fL (ref 78.0–100.0)
Platelets: 346 10*3/uL (ref 150–400)
RBC: 5.03 MIL/uL (ref 3.87–5.11)
RDW: 13.1 % (ref 11.5–15.5)
WBC: 8.7 10*3/uL (ref 4.0–10.5)

## 2013-05-05 LAB — PROTIME-INR
INR: 0.95 (ref 0.00–1.49)
Prothrombin Time: 12.5 seconds (ref 11.6–15.2)

## 2013-05-05 LAB — GLUCOSE, CAPILLARY: Glucose-Capillary: 104 mg/dL — ABNORMAL HIGH (ref 70–99)

## 2013-05-05 SURGERY — OPEN REDUCTION INTERNAL FIXATION (ORIF) ANKLE FRACTURE
Anesthesia: General | Site: Ankle | Laterality: Right

## 2013-05-05 MED ORDER — 0.9 % SODIUM CHLORIDE (POUR BTL) OPTIME
TOPICAL | Status: DC | PRN
  Administered 2013-05-05: 1000 mL

## 2013-05-05 MED ORDER — DEXAMETHASONE SODIUM PHOSPHATE 10 MG/ML IJ SOLN
INTRAMUSCULAR | Status: DC | PRN
  Administered 2013-05-05: 6 mg

## 2013-05-05 MED ORDER — MIDAZOLAM HCL 2 MG/2ML IJ SOLN
INTRAMUSCULAR | Status: AC
  Filled 2013-05-05: qty 2

## 2013-05-05 MED ORDER — FENTANYL CITRATE 0.05 MG/ML IJ SOLN
INTRAMUSCULAR | Status: AC
  Filled 2013-05-05: qty 5

## 2013-05-05 MED ORDER — CEFAZOLIN SODIUM-DEXTROSE 2-3 GM-% IV SOLR
INTRAVENOUS | Status: AC
  Filled 2013-05-05: qty 50

## 2013-05-05 MED ORDER — EPHEDRINE SULFATE 50 MG/ML IJ SOLN
INTRAMUSCULAR | Status: DC | PRN
  Administered 2013-05-05: 10 mg via INTRAVENOUS

## 2013-05-05 MED ORDER — PROPOFOL 10 MG/ML IV BOLUS
INTRAVENOUS | Status: AC
  Filled 2013-05-05: qty 20

## 2013-05-05 MED ORDER — ONDANSETRON HCL 4 MG/2ML IJ SOLN
INTRAMUSCULAR | Status: AC
  Filled 2013-05-05: qty 2

## 2013-05-05 MED ORDER — OXYCODONE HCL 5 MG/5ML PO SOLN: 5.0000 mg | Freq: Once | ORAL | Status: DC | PRN

## 2013-05-05 MED ORDER — CEFAZOLIN SODIUM-DEXTROSE 2-3 GM-% IV SOLR
2.0000 g | INTRAVENOUS | Status: AC
  Administered 2013-05-05: 2 g via INTRAVENOUS

## 2013-05-05 MED ORDER — FENTANYL CITRATE 0.05 MG/ML IJ SOLN
INTRAMUSCULAR | Status: AC
  Administered 2013-05-05: 100 ug via INTRAVENOUS
  Filled 2013-05-05: qty 2

## 2013-05-05 MED ORDER — PROMETHAZINE HCL 25 MG/ML IJ SOLN: 6.2500 mg | INTRAMUSCULAR | Status: DC | PRN

## 2013-05-05 MED ORDER — HYDROMORPHONE HCL PF 1 MG/ML IJ SOLN: 0.2500 mg | INTRAMUSCULAR | Status: DC | PRN

## 2013-05-05 MED ORDER — FENTANYL CITRATE 0.05 MG/ML IJ SOLN
INTRAMUSCULAR | Status: DC | PRN
Start: 2013-05-05 — End: 2013-05-05
  Administered 2013-05-05: 100 ug via INTRAVENOUS

## 2013-05-05 MED ORDER — PROPOFOL 10 MG/ML IV BOLUS
INTRAVENOUS | Status: DC | PRN
  Administered 2013-05-05: 150 mg via INTRAVENOUS

## 2013-05-05 MED ORDER — MIDAZOLAM HCL 2 MG/2ML IJ SOLN
INTRAMUSCULAR | Status: AC
  Administered 2013-05-05: 2 mg via INTRAVENOUS
  Filled 2013-05-05: qty 2

## 2013-05-05 MED ORDER — BUPIVACAINE-EPINEPHRINE PF 0.5-1:200000 % IJ SOLN
INTRAMUSCULAR | Status: DC | PRN
  Administered 2013-05-05: 150 mg via PERINEURAL

## 2013-05-05 MED ORDER — MIDAZOLAM HCL 2 MG/2ML IJ SOLN
1.0000 mg | INTRAMUSCULAR | Status: DC | PRN
  Administered 2013-05-05: 2 mg via INTRAVENOUS

## 2013-05-05 MED ORDER — ONDANSETRON HCL 4 MG/2ML IJ SOLN
INTRAMUSCULAR | Status: DC | PRN
  Administered 2013-05-05: 4 mg via INTRAVENOUS

## 2013-05-05 MED ORDER — FENTANYL CITRATE 0.05 MG/ML IJ SOLN
50.0000 ug | INTRAMUSCULAR | Status: DC | PRN
  Administered 2013-05-05: 100 ug via INTRAVENOUS

## 2013-05-05 MED ORDER — LACTATED RINGERS IV SOLN
INTRAVENOUS | Status: DC | PRN
  Administered 2013-05-05 (×2): via INTRAVENOUS

## 2013-05-05 MED ORDER — HYDROCODONE-ACETAMINOPHEN 5-325 MG PO TABS: 1.0000 | ORAL_TABLET | Freq: Four times a day (QID) | ORAL | Status: DC | PRN

## 2013-05-05 MED ORDER — OXYCODONE HCL 5 MG PO TABS: 5.0000 mg | ORAL_TABLET | Freq: Once | ORAL | Status: DC | PRN

## 2013-05-05 SURGICAL SUPPLY — 53 items
BANDAGE ESMARK 6X9 LF (GAUZE/BANDAGES/DRESSINGS) IMPLANT
BANDAGE GAUZE ELAST BULKY 4 IN (GAUZE/BANDAGES/DRESSINGS) ×2 IMPLANT
BIT DRILL 2.5X110 QC LCP DISP (BIT) ×2 IMPLANT
BIT DRILL PERC QC 2.8X200 100 (BIT) IMPLANT
BNDG CMPR 9X6 STRL LF SNTH (GAUZE/BANDAGES/DRESSINGS)
BNDG COHESIVE 4X5 TAN STRL (GAUZE/BANDAGES/DRESSINGS) ×3 IMPLANT
BNDG ESMARK 6X9 LF (GAUZE/BANDAGES/DRESSINGS)
BNDG GAUZE STRTCH 6 (GAUZE/BANDAGES/DRESSINGS) ×1 IMPLANT
CLOTH BEACON ORANGE TIMEOUT ST (SAFETY) ×1 IMPLANT
COVER SURGICAL LIGHT HANDLE (MISCELLANEOUS) ×3 IMPLANT
CUFF TOURNIQUET SINGLE 34IN LL (TOURNIQUET CUFF) IMPLANT
CUFF TOURNIQUET SINGLE 44IN (TOURNIQUET CUFF) IMPLANT
DRAPE C-ARM MINI 42X72 WSTRAPS (DRAPES) ×2 IMPLANT
DRAPE INCISE IOBAN 66X45 STRL (DRAPES) ×3 IMPLANT
DRAPE PROXIMA HALF (DRAPES) ×3 IMPLANT
DRAPE U-SHAPE 47X51 STRL (DRAPES) ×3 IMPLANT
DRILL BIT QUICK COUP 2.8MM 100 (BIT) ×2
DRSG ADAPTIC 3X8 NADH LF (GAUZE/BANDAGES/DRESSINGS) ×3 IMPLANT
DRSG PAD ABDOMINAL 8X10 ST (GAUZE/BANDAGES/DRESSINGS) ×2 IMPLANT
DURAPREP 26ML APPLICATOR (WOUND CARE) ×3 IMPLANT
ELECT REM PT RETURN 9FT ADLT (ELECTROSURGICAL) ×3
ELECTRODE REM PT RTRN 9FT ADLT (ELECTROSURGICAL) ×1 IMPLANT
GLOVE BIOGEL PI IND STRL 9 (GLOVE) ×1 IMPLANT
GLOVE BIOGEL PI INDICATOR 9 (GLOVE) ×2
GLOVE SURG ORTHO 9.0 STRL STRW (GLOVE) ×5 IMPLANT
GOWN PREVENTION PLUS XLARGE (GOWN DISPOSABLE) ×5 IMPLANT
GOWN SRG XL XLNG 56XLVL 4 (GOWN DISPOSABLE) ×2 IMPLANT
GOWN STRL NON-REIN XL XLG LVL4 (GOWN DISPOSABLE)
KIT BASIN OR (CUSTOM PROCEDURE TRAY) ×3 IMPLANT
KIT ROOM TURNOVER OR (KITS) ×3 IMPLANT
MANIFOLD NEPTUNE II (INSTRUMENTS) ×1 IMPLANT
NS IRRIG 1000ML POUR BTL (IV SOLUTION) ×3 IMPLANT
PACK ORTHO EXTREMITY (CUSTOM PROCEDURE TRAY) ×3 IMPLANT
PAD ARMBOARD 7.5X6 YLW CONV (MISCELLANEOUS) ×4 IMPLANT
PADDING CAST COTTON 6X4 STRL (CAST SUPPLIES) ×1 IMPLANT
PLATE LCP 3.5 1/3 TUB 7HX81 (Plate) ×2 IMPLANT
SCREW CORTEX 3.5 12MM (Screw) ×4 IMPLANT
SCREW CORTEX 3.5 14MM (Screw) ×2 IMPLANT
SCREW LOCK CORT ST 3.5X12 (Screw) IMPLANT
SCREW LOCK CORT ST 3.5X14 (Screw) IMPLANT
SCREW LOCK T15 FT 14X3.5X2.9X (Screw) IMPLANT
SCREW LOCKING 3.5X14 (Screw) ×6 IMPLANT
SPONGE GAUZE 4X4 12PLY (GAUZE/BANDAGES/DRESSINGS) ×3 IMPLANT
SPONGE LAP 18X18 X RAY DECT (DISPOSABLE) ×3 IMPLANT
STAPLER VISISTAT 35W (STAPLE) IMPLANT
SUCTION FRAZIER TIP 10 FR DISP (SUCTIONS) ×3 IMPLANT
SUT ETHILON 2 0 PSLX (SUTURE) IMPLANT
SUT VIC AB 2-0 CTB1 (SUTURE) ×6 IMPLANT
TOWEL OR 17X24 6PK STRL BLUE (TOWEL DISPOSABLE) ×3 IMPLANT
TOWEL OR 17X26 10 PK STRL BLUE (TOWEL DISPOSABLE) ×3 IMPLANT
TUBE CONNECTING 12'X1/4 (SUCTIONS) ×1
TUBE CONNECTING 12X1/4 (SUCTIONS) ×2 IMPLANT
WATER STERILE IRR 1000ML POUR (IV SOLUTION) ×1 IMPLANT

## 2013-05-05 NOTE — Op Note (Signed)
OPERATIVE REPORT  DATE OF SURGERY: 05/05/2013  PATIENT:  Anna [name],  63 y.o. female  PRE-OPERATIVE DIAGNOSIS:  RIGHT ANKLE FX.  POST-OPERATIVE DIAGNOSIS:  RIGHT ANKLE FX.  PROCEDURE:  Procedure(s): OPEN REDUCTION INTERNAL FIXATION (ORIF) ANKLE FRACTURE- right  SURGEON:  Surgeon(s): Newt Minion, MD  ANESTHESIA:   regional and general  EBL:  min ML  SPECIMEN:  No Specimen  TOURNIQUET:  * No tourniquets in log *  PROCEDURE DETAILS: Patient is a 63 year old woman who fell Sunday sustaining a Weber B. fibular fracture. Patient presents at this time for open reduction internal fixation. Risks and benefits were discussed including infection neurovascular injury need for additional surgery. Patient states she understands and wishes to proceed at this time. Description of procedure patient was brought to the operating room and underwent a general anesthetic after a popliteal block. After adequate levels and anesthesia were obtained patient's right lower extremity was prepped using DuraPrep and draped into a sterile field. A lateral incision was made over the fibula this was carried sharply down to bone. The fracture site was freshened irrigated and reduced. A one third locking tubular plate was applied laterally as a anti-glide plate. This was secured proximally with 3 cortical screws and distally with 2 locking screws. C-arm fluoroscopy verified reduction. The wound was irrigated with normal saline. The subcutaneous is closed using 2-0 Vicryl the skin was closed using staples. The wound was covered with Adaptic orthopedic sponges ABDs dressing Kerlix and Coban. Patient was extubated taken to the PACU in stable condition  PLAN OF CARE: Discharge to home after PACU  PATIENT DISPOSITION:  PACU - hemodynamically stable.   Newt Minion, MD 05/05/2013 4:53 PM

## 2013-05-05 NOTE — Anesthesia Preprocedure Evaluation (Signed)
Anesthesia Evaluation  Patient identified by MRN, date of birth, ID band Patient awake    Reviewed: Allergy & Precautions, H&P , NPO status , Patient's Chart, lab work & pertinent test results  Airway Mallampati: II TM Distance: >3 FB Neck ROM: Full    Dental  (+) Teeth Intact, Dental Advisory Given   Pulmonary Current Smoker,    Pulmonary exam normal       Cardiovascular negative cardio ROS      Neuro/Psych negative neurological ROS  negative psych ROS   GI/Hepatic negative GI ROS, Neg liver ROS,   Endo/Other  diabetes, Type 2  Renal/GU negative Renal ROS     Musculoskeletal  (+) Fibromyalgia -  Abdominal   Peds  Hematology   Anesthesia Other Findings   Reproductive/Obstetrics                           Anesthesia Physical Anesthesia Plan  ASA: II  Anesthesia Plan: General   Post-op Pain Management:    Induction:   Airway Management Planned: LMA  Additional Equipment:   Intra-op Plan:   Post-operative Plan: Extubation in OR  Informed Consent: I have reviewed the patients History and Physical, chart, labs and discussed the procedure including the risks, benefits and alternatives for the proposed anesthesia with the patient or authorized representative who has indicated his/her understanding and acceptance.   Dental advisory given  Plan Discussed with: CRNA, Anesthesiologist and Surgeon  Anesthesia Plan Comments:         Anesthesia Quick Evaluation

## 2013-05-05 NOTE — Anesthesia Procedure Notes (Addendum)
Anesthesia Regional Block:  Popliteal block  Pre-Anesthetic Checklist: ,, timeout performed, Correct Patient, Correct Site, Correct Laterality, Correct Procedure, Correct Position, site marked, Risks and benefits discussed,  Surgical consent,  Pre-op evaluation,  At surgeon's request and post-op pain management  Laterality: Right  Prep: chloraprep       Needles:  Injection technique: Single-shot  Needle Type: Echogenic Stimulator Needle          Additional Needles:  Procedures: ultrasound guided (picture in chart) and nerve stimulator Popliteal block  Nerve Stimulator or Paresthesia:  Response: plantar flexion, 0.45 mA,   Additional Responses:   Narrative:  Start time: 05/05/2013 3:44 PM End time: 05/05/2013 3:54 PM  Performed by: Personally  Anesthesiologist: J. Tamela Gammon, MD  Additional Notes: A functioning IV was confirmed and monitors were applied.  Sterile prep and drape, hand hygiene and sterile gloves were used.  Negative aspiration and test dose prior to incremental administration of local anesthetic. The patient tolerated the procedure well.Ultrasound  guidance: relevant anatomy identified, needle position confirmed, local anesthetic spread visualized around nerve(s), vascular puncture avoided.  Image printed for medical record.    Procedure Name: LMA Insertion Date/Time: 05/05/2013 4:14 PM Performed by: Eligha Bridegroom Pre-anesthesia Checklist: Patient identified, Timeout performed, Emergency Drugs available, Suction available and Patient being monitored Patient Re-evaluated:Patient Re-evaluated prior to inductionOxygen Delivery Method: Circle system utilized Preoxygenation: Pre-oxygenation with 100% oxygen Intubation Type: IV induction LMA: LMA inserted LMA Size: 4.0 Number of attempts: 1 Tube secured with: Tape Dental Injury: Teeth and Oropharynx as per pre-operative assessment

## 2013-05-05 NOTE — Discharge Instructions (Signed)

## 2013-05-05 NOTE — H&P (Signed)
Anna [name] is an 63 y.o. female.   Chief Complaint: Anna B. right fibular fracture HPI: Patient is a 63 year old woman who states he stepped on a gumball sustained a twisting injury to her right ankle had immediate onset of pain laterally. She went to the urgent care was radiographed radiographs showed a displaced Anna B. fracture she was placed in a splint and presents at this time for evaluation and treatment.  Past Medical History  Diagnosis Date  . Fibromyalgia   . Pain in limb   . Arthritis   . Varicose veins     Past Surgical History  Procedure Laterality Date  . Fracture surgery      leg,arm,foot  . Eye surgery      cataracts    Family History  Problem Relation Age of Onset  . Heart disease Father   . Diabetes Paternal Aunt    Social History:  reports that she has been smoking Cigarettes.  She has been smoking about 0.25 packs per day. She does not have any smokeless tobacco history on file. She reports that she does not drink alcohol or use illicit drugs.  Allergies:  Allergies  Allergen Reactions  . Benadryl [Diphenhydramine Hcl] Other (See Comments)    chills    Medications Prior to Admission  Medication Sig Dispense Refill  . Calcium-Vitamin D (CALTRATE 600 PLUS-VIT D PO) Take 2 tablets by mouth daily.       . Glucosamine HCl (GLUCOSAMINE PO) Take 1-2 tablets by mouth daily as needed (for joint pain).      Marland Kitchen HYDROcodone-acetaminophen (NORCO/VICODIN) 5-325 MG per tablet Take 1 tablet by mouth every 4 (four) hours as needed for moderate pain.       Marland Kitchen MAGNESIUM PO Take 2 tablets by mouth daily.      . metFORMIN (GLUCOPHAGE) 500 MG tablet Take 500 mg by mouth daily with breakfast.      . Naproxen Sodium (ALEVE) 220 MG CAPS Take 220-440 mg by mouth 2 (two) times daily as needed (for pain).         Results for orders placed during the hospital encounter of 05/05/13 (from the past 48 hour(s))  APTT     Status: None   Collection Time    05/05/13  2:14 PM   Result Value Ref Range   aPTT 28  24 - 37 seconds  CBC     Status: None   Collection Time    05/05/13  2:14 PM      Result Value Ref Range   WBC 8.7  4.0 - 10.5 K/uL   RBC 5.03  3.87 - 5.11 MIL/uL   Hemoglobin 15.0  12.0 - 15.0 g/dL   HCT 43.3  36.0 - 46.0 %   MCV 86.1  78.0 - 100.0 fL   MCH 29.8  26.0 - 34.0 pg   MCHC 34.6  30.0 - 36.0 g/dL   RDW 13.1  11.5 - 15.5 %   Platelets 346  150 - 400 K/uL  COMPREHENSIVE METABOLIC PANEL     Status: Abnormal   Collection Time    05/05/13  2:14 PM      Result Value Ref Range   Sodium 142  137 - 147 mEq/L   Potassium 4.0  3.7 - 5.3 mEq/L   Chloride 101  96 - 112 mEq/L   CO2 28  19 - 32 mEq/L   Glucose, Bld 123 (*) 70 - 99 mg/dL   BUN 14  6 - 23  mg/dL   Creatinine, Ser 0.70  0.50 - 1.10 mg/dL   Calcium 9.3  8.4 - 10.5 mg/dL   Total Protein 7.8  6.0 - 8.3 g/dL   Albumin 4.1  3.5 - 5.2 g/dL   AST 23  0 - 37 U/L   ALT 27  0 - 35 U/L   Alkaline Phosphatase 80  39 - 117 U/L   Total Bilirubin 0.4  0.3 - 1.2 mg/dL   GFR calc non Af Amer 89 (*) >90 mL/min   GFR calc Af Amer >90  >90 mL/min   Comment: (NOTE)     The eGFR has been calculated using the CKD EPI equation.     This calculation has not been validated in all clinical situations.     eGFR's persistently <90 mL/min signify possible Chronic Kidney     Disease.  PROTIME-INR     Status: None   Collection Time    05/05/13  2:14 PM      Result Value Ref Range   Prothrombin Time 12.5  11.6 - 15.2 seconds   INR 0.95  0.00 - 1.49  GLUCOSE, CAPILLARY     Status: Abnormal   Collection Time    05/05/13  2:36 PM      Result Value Ref Range   Glucose-Capillary 104 (*) 70 - 99 mg/dL   No results found.  Review of Systems  All other systems reviewed and are negative.    Blood pressure 207/72, pulse 68, temperature 98.3 F (36.8 C), temperature source Oral, resp. rate 18, height 5' 4"  (1.626 m), weight 76.204 kg (168 lb), SpO2 100.00%. Physical Exam  On examination patient has good  capillary refill in the right foot. Radiograph shows a displaced Anna B. fibular fracture. Patient does have arthritic changes in the ankle joint as well as throughout the hindfoot and midfoot. Assessment/Plan Assessment: Anna B. right ankle fibular fracture.  Plan: Will plan for open reduction internal fixation. Risks and benefits were discussed including infection neurovascular injury arthritis need for additional surgery. Patient states she understands and wished to proceed at this time.  Anna [name] V 05/05/2013, 3:59 PM

## 2013-05-05 NOTE — Progress Notes (Signed)
Orthopedic Tech Progress Note Patient Details:  Anna [name] [birth year] [id number]  Ortho Devices Type of Ortho Device: CAM walker Ortho Device/Splint Location: RLE Ortho Device/Splint Interventions: Criss Alvine 05/05/2013, 5:18 PM

## 2013-05-05 NOTE — Anesthesia Postprocedure Evaluation (Signed)
Anesthesia Post Note  Patient: Anna [name]  Procedure(s) Performed: Procedure(s) (LRB): OPEN REDUCTION INTERNAL FIXATION (ORIF) ANKLE FRACTURE- right (Right)  Anesthesia type: general  Patient location: PACU  Post pain: Pain level controlled  Post assessment: Patient's Cardiovascular Status Stable  Last Vitals:  Filed Vitals:   05/05/13 1715  BP: 134/55  Pulse: 89  Temp: 36.4 C  Resp: 12    Post vital signs: Reviewed and stable  Level of consciousness: sedated  Complications: No apparent anesthesia complications

## 2013-05-05 NOTE — Transfer of Care (Signed)
2Immediate Anesthesia Transfer of Care Note  Patient: Anna [name]  Procedure(s) Performed: Procedure(s): OPEN REDUCTION INTERNAL FIXATION (ORIF) ANKLE FRACTURE- right (Right)  Patient Location: PACU  Anesthesia Type:General  Level of Consciousness: awake, alert  and oriented  Airway & Oxygen Therapy: Patient Spontanous Breathing and Patient connected to nasal cannula oxygen  Post-op Assessment: Report given to PACU RN and Post -op Vital signs reviewed and stable  Post vital signs: Reviewed and stable  Complications: No apparent anesthesia complications

## 2013-05-05 NOTE — Preoperative (Signed)
Beta Blockers   Reason not to administer Beta Blockers:Not Applicable 

## 2013-05-06 NOTE — Discharge Summary (Signed)
  Discharge to home in stable condition. Status post open reduction internal fixation right ankle Weber B. fracture. Nonweightbearing right lower extremity. Prescription for Vicodin for pain. Followup in the office in 2 weeks.

## 2013-05-09 ENCOUNTER — Encounter (HOSPITAL_COMMUNITY): Payer: Self-pay | Admitting: Orthopedic Surgery

## 2013-06-23 ENCOUNTER — Encounter (HOSPITAL_COMMUNITY): Payer: Self-pay | Admitting: Pharmacy Technician

## 2013-06-23 ENCOUNTER — Encounter (HOSPITAL_COMMUNITY): Payer: Self-pay | Admitting: *Deleted

## 2013-06-23 ENCOUNTER — Other Ambulatory Visit (HOSPITAL_COMMUNITY): Payer: Self-pay | Admitting: *Deleted

## 2013-06-23 ENCOUNTER — Other Ambulatory Visit (HOSPITAL_COMMUNITY): Payer: Self-pay | Admitting: Orthopedic Surgery

## 2013-06-23 NOTE — Progress Notes (Signed)
Pt requested Dr. Tobias Alexander for her anesthesiologist and Eligha Bridegroom as the CRNA. I spoke with Ebony Hail, [place] and she will put in the request for Dr. Tobias Alexander. I also spoke with Jeani Hawking, [place] director and she will see if Delfino Lovett is working on Wednesday and will have him be pt's CRNA if he is working.

## 2013-06-24 ENCOUNTER — Other Ambulatory Visit (HOSPITAL_COMMUNITY): Payer: 59

## 2013-06-24 MED ORDER — CEFAZOLIN SODIUM-DEXTROSE 2-3 GM-% IV SOLR: 2.0000 g | INTRAVENOUS | Status: DC

## 2013-06-25 ENCOUNTER — Ambulatory Visit (HOSPITAL_COMMUNITY): Payer: 59 | Admitting: Anesthesiology

## 2013-06-25 ENCOUNTER — Ambulatory Visit (HOSPITAL_COMMUNITY)
Admission: RE | Admit: 2013-06-25 | Discharge: 2013-06-25 | Disposition: A | Discharge status: Home / Self Care | Payer: 59 | Source: Ambulatory Visit | Attending: Orthopedic Surgery | Admitting: Orthopedic Surgery

## 2013-06-25 ENCOUNTER — Encounter (HOSPITAL_COMMUNITY): Payer: 59 | Admitting: Anesthesiology

## 2013-06-25 ENCOUNTER — Encounter (HOSPITAL_COMMUNITY): Payer: Self-pay | Admitting: *Deleted

## 2013-06-25 ENCOUNTER — Encounter (HOSPITAL_COMMUNITY)
Admission: RE | Disposition: A | Discharge status: Home / Self Care | Payer: Self-pay | Source: Ambulatory Visit | Attending: Orthopedic Surgery

## 2013-06-25 DIAGNOSIS — T847XXA Infection and inflammatory reaction due to other internal orthopedic prosthetic devices, implants and grafts, initial encounter: Secondary | ICD-10-CM | POA: Insufficient documentation

## 2013-06-25 DIAGNOSIS — Y831 Surgical operation with implant of artificial internal device as the cause of abnormal reaction of the patient, or of later complication, without mention of misadventure at the time of the procedure: Secondary | ICD-10-CM | POA: Insufficient documentation

## 2013-06-25 DIAGNOSIS — I8390 Asymptomatic varicose veins of unspecified lower extremity: Secondary | ICD-10-CM | POA: Insufficient documentation

## 2013-06-25 DIAGNOSIS — M129 Arthropathy, unspecified: Secondary | ICD-10-CM | POA: Insufficient documentation

## 2013-06-25 DIAGNOSIS — F1920 Other psychoactive substance dependence, uncomplicated: Secondary | ICD-10-CM | POA: Insufficient documentation

## 2013-06-25 DIAGNOSIS — IMO0001 Reserved for inherently not codable concepts without codable children: Secondary | ICD-10-CM | POA: Insufficient documentation

## 2013-06-25 DIAGNOSIS — E119 Type 2 diabetes mellitus without complications: Secondary | ICD-10-CM | POA: Insufficient documentation

## 2013-06-25 DIAGNOSIS — Z87891 Personal history of nicotine dependence: Secondary | ICD-10-CM | POA: Insufficient documentation

## 2013-06-25 HISTORY — DX: Chronic kidney disease, unspecified: N18.9

## 2013-06-25 HISTORY — DX: Osteomyelitis, unspecified: M86.9

## 2013-06-25 HISTORY — PX: HARDWARE REMOVAL: SHX979

## 2013-06-25 HISTORY — DX: Type 2 diabetes mellitus without complications: E11.9

## 2013-06-25 LAB — COMPREHENSIVE METABOLIC PANEL
ALT: 19 U/L (ref 0–35)
AST: 21 U/L (ref 0–37)
Albumin: 3.7 g/dL (ref 3.5–5.2)
Alkaline Phosphatase: 80 U/L (ref 39–117)
BILIRUBIN TOTAL: 0.3 mg/dL (ref 0.3–1.2)
BUN: 13 mg/dL (ref 6–23)
CHLORIDE: 101 meq/L (ref 96–112)
CO2: 27 meq/L (ref 19–32)
CREATININE: 0.79 mg/dL (ref 0.50–1.10)
Calcium: 9.7 mg/dL (ref 8.4–10.5)
GFR calc Af Amer: >90 mL/min (ref >90)
GFR, EST NON AFRICAN AMERICAN: 86 mL/min — AB (ref >90)
GLUCOSE: 164 mg/dL — AB (ref 70–99)
Potassium: 4.9 mEq/L (ref 3.7–5.3)
Sodium: 142 mEq/L (ref 137–147)
Total Protein: 7 g/dL (ref 6.0–8.3)

## 2013-06-25 LAB — CBC
HEMATOCRIT: 38.8 % (ref 36.0–46.0)
Hemoglobin: 13.3 g/dL (ref 12.0–15.0)
MCH: 29.6 pg (ref 26.0–34.0)
MCHC: 34.3 g/dL (ref 30.0–36.0)
MCV: 86.4 fL (ref 78.0–100.0)
Platelets: 302 10*3/uL (ref 150–400)
RBC: 4.49 MIL/uL (ref 3.87–5.11)
RDW: 13.4 % (ref 11.5–15.5)
WBC: 7 10*3/uL (ref 4.0–10.5)

## 2013-06-25 LAB — GLUCOSE, CAPILLARY
GLUCOSE-CAPILLARY: 155 mg/dL — AB (ref 70–99)
Glucose-Capillary: 168 mg/dL — ABNORMAL HIGH (ref 70–99)

## 2013-06-25 LAB — PROTIME-INR
INR: 0.86 (ref 0.00–1.49)
PROTHROMBIN TIME: 11.6 s (ref 11.6–15.2)

## 2013-06-25 LAB — APTT: aPTT: 31 seconds (ref 24–37)

## 2013-06-25 SURGERY — REMOVAL, HARDWARE
Anesthesia: General | Site: Ankle | Laterality: Right

## 2013-06-25 MED ORDER — ONDANSETRON HCL 4 MG/2ML IJ SOLN
INTRAMUSCULAR | Status: DC | PRN
  Administered 2013-06-25: 4 mg via INTRAVENOUS

## 2013-06-25 MED ORDER — OXYCODONE HCL 5 MG PO TABS: 5.0000 mg | ORAL_TABLET | Freq: Once | ORAL | Status: DC | PRN

## 2013-06-25 MED ORDER — VANCOMYCIN HCL 500 MG IV SOLR
INTRAVENOUS | Status: AC
  Filled 2013-06-25: qty 500

## 2013-06-25 MED ORDER — HYDROCODONE-ACETAMINOPHEN 5-325 MG PO TABS: 1.0000 | ORAL_TABLET | Freq: Four times a day (QID) | ORAL | Status: DC | PRN

## 2013-06-25 MED ORDER — GENTAMICIN SULFATE 40 MG/ML IJ SOLN
INTRAMUSCULAR | Status: AC
  Filled 2013-06-25: qty 2

## 2013-06-25 MED ORDER — LACTATED RINGERS IV SOLN
INTRAVENOUS | Status: DC | PRN
  Administered 2013-06-25: 12:00:00 via INTRAVENOUS

## 2013-06-25 MED ORDER — OXYCODONE HCL 5 MG/5ML PO SOLN: 5.0000 mg | Freq: Once | ORAL | Status: DC | PRN

## 2013-06-25 MED ORDER — LACTATED RINGERS IV SOLN
INTRAVENOUS | Status: DC
  Administered 2013-06-25: 50 mL/h via INTRAVENOUS

## 2013-06-25 MED ORDER — MIDAZOLAM HCL 2 MG/2ML IJ SOLN
INTRAMUSCULAR | Status: AC
  Filled 2013-06-25: qty 2

## 2013-06-25 MED ORDER — PROPOFOL 10 MG/ML IV BOLUS
INTRAVENOUS | Status: DC | PRN
  Administered 2013-06-25: 170 mg via INTRAVENOUS

## 2013-06-25 MED ORDER — MIDAZOLAM HCL 2 MG/2ML IJ SOLN
INTRAMUSCULAR | Status: AC
  Administered 2013-06-25: 2 mg via INTRAVENOUS
  Filled 2013-06-25: qty 2

## 2013-06-25 MED ORDER — HYDROMORPHONE HCL PF 1 MG/ML IJ SOLN: 0.2500 mg | INTRAMUSCULAR | Status: DC | PRN

## 2013-06-25 MED ORDER — LIDOCAINE HCL (CARDIAC) 20 MG/ML IV SOLN
INTRAVENOUS | Status: DC | PRN
  Administered 2013-06-25: 70 mg via INTRAVENOUS

## 2013-06-25 MED ORDER — VANCOMYCIN HCL 500 MG IV SOLR
INTRAVENOUS | Status: DC | PRN
  Administered 2013-06-25: 250 mg

## 2013-06-25 MED ORDER — DOXYCYCLINE HYCLATE 50 MG PO CAPS: 100.0000 mg | ORAL_CAPSULE | Freq: Two times a day (BID) | ORAL | Status: DC

## 2013-06-25 MED ORDER — CEFAZOLIN SODIUM-DEXTROSE 2-3 GM-% IV SOLR
INTRAVENOUS | Status: AC
  Administered 2013-06-25: 2 g via INTRAVENOUS
  Filled 2013-06-25: qty 50

## 2013-06-25 MED ORDER — PROMETHAZINE HCL 25 MG/ML IJ SOLN: 6.2500 mg | INTRAMUSCULAR | Status: DC | PRN

## 2013-06-25 MED ORDER — GENTAMICIN SULFATE 40 MG/ML IJ SOLN
INTRAMUSCULAR | Status: DC | PRN
  Administered 2013-06-25: 80 mg via INTRAMUSCULAR

## 2013-06-25 MED ORDER — FENTANYL CITRATE 0.05 MG/ML IJ SOLN
INTRAMUSCULAR | Status: AC
  Administered 2013-06-25: 100 ug via INTRAVENOUS
  Filled 2013-06-25: qty 2

## 2013-06-25 MED ORDER — 0.9 % SODIUM CHLORIDE (POUR BTL) OPTIME
TOPICAL | Status: DC | PRN
  Administered 2013-06-25: 1000 mL

## 2013-06-25 MED ORDER — FENTANYL CITRATE 0.05 MG/ML IJ SOLN
INTRAMUSCULAR | Status: AC
  Filled 2013-06-25: qty 5

## 2013-06-25 MED ORDER — PROPOFOL 10 MG/ML IV BOLUS
INTRAVENOUS | Status: AC
  Filled 2013-06-25: qty 20

## 2013-06-25 SURGICAL SUPPLY — 50 items
BANDAGE ELASTIC 4 VELCRO ST LF (GAUZE/BANDAGES/DRESSINGS) IMPLANT
BANDAGE ELASTIC 6 VELCRO ST LF (GAUZE/BANDAGES/DRESSINGS) IMPLANT
BANDAGE ESMARK 6X9 LF (GAUZE/BANDAGES/DRESSINGS) IMPLANT
BANDAGE GAUZE ELAST BULKY 4 IN (GAUZE/BANDAGES/DRESSINGS) ×3 IMPLANT
BNDG CMPR 9X6 STRL LF SNTH (GAUZE/BANDAGES/DRESSINGS)
BNDG COHESIVE 4X5 TAN STRL (GAUZE/BANDAGES/DRESSINGS) IMPLANT
BNDG ESMARK 6X9 LF (GAUZE/BANDAGES/DRESSINGS)
BNDG GAUZE ELAST 4 BULKY (GAUZE/BANDAGES/DRESSINGS) ×2 IMPLANT
COVER SURGICAL LIGHT HANDLE (MISCELLANEOUS) ×3 IMPLANT
CUFF TOURNIQUET SINGLE 34IN LL (TOURNIQUET CUFF) IMPLANT
CUFF TOURNIQUET SINGLE 44IN (TOURNIQUET CUFF) IMPLANT
DRAPE C-ARM 42X72 X-RAY (DRAPES) IMPLANT
DRAPE INCISE IOBAN 66X45 STRL (DRAPES) IMPLANT
DRAPE ORTHO SPLIT 77X108 STRL (DRAPES)
DRAPE SURG ORHT 6 SPLT 77X108 (DRAPES) IMPLANT
DRSG ADAPTIC 3X8 NADH LF (GAUZE/BANDAGES/DRESSINGS) ×2 IMPLANT
DRSG EMULSION OIL 3X3 NADH (GAUZE/BANDAGES/DRESSINGS) ×3 IMPLANT
DRSG PAD ABDOMINAL 8X10 ST (GAUZE/BANDAGES/DRESSINGS) ×3 IMPLANT
ELECT REM PT RETURN 9FT ADLT (ELECTROSURGICAL) ×3
ELECTRODE REM PT RTRN 9FT ADLT (ELECTROSURGICAL) ×1 IMPLANT
GLOVE BIOGEL PI IND STRL 9 (GLOVE) ×1 IMPLANT
GLOVE BIOGEL PI INDICATOR 9 (GLOVE) ×2
GLOVE SURG ORTHO 9.0 STRL STRW (GLOVE) ×3 IMPLANT
GOWN STRL REUS W/ TWL XL LVL3 (GOWN DISPOSABLE) ×3 IMPLANT
GOWN STRL REUS W/TWL XL LVL3 (GOWN DISPOSABLE) ×9
KIT BASIN OR (CUSTOM PROCEDURE TRAY) ×3 IMPLANT
KIT ROOM TURNOVER OR (KITS) ×3 IMPLANT
KIT STIMULAN RAPID CURE 5CC (Orthopedic Implant) ×2 IMPLANT
MANIFOLD NEPTUNE II (INSTRUMENTS) ×3 IMPLANT
NS IRRIG 1000ML POUR BTL (IV SOLUTION) ×3 IMPLANT
PACK ORTHO EXTREMITY (CUSTOM PROCEDURE TRAY) ×3 IMPLANT
PAD ARMBOARD 7.5X6 YLW CONV (MISCELLANEOUS) ×6 IMPLANT
PAD CAST 4YDX4 CTTN HI CHSV (CAST SUPPLIES) ×1 IMPLANT
PADDING CAST COTTON 4X4 STRL (CAST SUPPLIES) ×3
SPONGE GAUZE 4X4 12PLY (GAUZE/BANDAGES/DRESSINGS) ×3 IMPLANT
SPONGE GAUZE 4X4 12PLY STER LF (GAUZE/BANDAGES/DRESSINGS) ×2 IMPLANT
SPONGE LAP 18X18 X RAY DECT (DISPOSABLE) ×3 IMPLANT
STAPLER VISISTAT 35W (STAPLE) IMPLANT
STOCKINETTE IMPERVIOUS 9X36 MD (GAUZE/BANDAGES/DRESSINGS) IMPLANT
SUT ETHILON 2 0 PSLX (SUTURE) ×2 IMPLANT
SUT VIC AB 0 CT1 27 (SUTURE)
SUT VIC AB 0 CT1 27XBRD ANBCTR (SUTURE) IMPLANT
SUT VIC AB 2-0 CT1 27 (SUTURE)
SUT VIC AB 2-0 CT1 TAPERPNT 27 (SUTURE) IMPLANT
TOWEL OR 17X24 6PK STRL BLUE (TOWEL DISPOSABLE) ×3 IMPLANT
TOWEL OR 17X26 10 PK STRL BLUE (TOWEL DISPOSABLE) ×3 IMPLANT
TUBE CONNECTING 12'X1/4 (SUCTIONS) ×1
TUBE CONNECTING 12X1/4 (SUCTIONS) ×1 IMPLANT
WATER STERILE IRR 1000ML POUR (IV SOLUTION) ×3 IMPLANT
YANKAUER SUCT BULB TIP NO VENT (SUCTIONS) ×2 IMPLANT

## 2013-06-25 NOTE — Op Note (Signed)
OPERATIVE REPORT  DATE OF SURGERY: 06/25/2013  PATIENT:  Anna [name],  63 y.o. female  PRE-OPERATIVE DIAGNOSIS:  Infected deep hardware right ankle  POST-OPERATIVE DIAGNOSIS:  Infected deep hardware right ankle  PROCEDURE:  Procedure(s): RIGHT ANKLE REMOVAL DEEP HARDWARE Local tissue rearrangement 6 x 3 cm  SURGEON:  Surgeon(s): Newt Minion, MD  ANESTHESIA:   general  EBL:  min ML  SPECIMEN:  No Specimen  TOURNIQUET:  * No tourniquets in log *  PROCEDURE DETAILS: Patient is a 63 year old woman who is status post open reduction internal fixation for her right ankle fracture. Patient has had a very small amount of drainage very small amount of cellulitis and presents at this time for removal of deep retained hardware. Risks and benefits were discussed including persistent infection nonhealing of the wound need for additional surgery. Patient states she understands and wished to proceed at this time. Description of procedure patient brought to the operating room underwent a general anesthetic. After adequate levels and anesthesia obtained patient's right lower extremity was prepped using DuraPrep draped into a sterile field. A timeout was called. A lateral incision was made and the area of cellulitis and 3 ulcers were ellipsed out in one block of tissue. The wound was 6 x 3 cm. The plate and screws were removed without complications. The skin soft tissue and bone was debrided. There is no signs of any abscess no signs of any necrotic tissue. The wound was irrigated with normal saline. The incision was closed after local tissue rearrangement with 2-0 nylon. The wound was covered with Adaptic orthopedic sponges AB dressing Kerlix and Coban. Patient was extubated taken the PACU in stable condition plan for discharge to home.  PLAN OF CARE: Discharge to home after PACU  PATIENT DISPOSITION:  PACU - hemodynamically stable.   Newt Minion, MD 06/25/2013 12:30 PM

## 2013-06-25 NOTE — Anesthesia Postprocedure Evaluation (Signed)
Anesthesia Post Note  Patient: Anna [name]  Procedure(s) Performed: Procedure(s) (LRB): RIGHT ANKLE REMOVAL DEEP HARDWARE (Right)  Anesthesia type: general  Patient location: PACU  Post pain: Pain level controlled  Post assessment: Patient's Cardiovascular Status Stable  Last Vitals:  Filed Vitals:   06/25/13 1330  BP: 164/75  Pulse: 68  Temp:   Resp: 15    Post vital signs: Reviewed and stable  Level of consciousness: sedated  Complications: No apparent anesthesia complications
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
## 2013-06-25 NOTE — Anesthesia Preprocedure Evaluation (Addendum)
Anesthesia Evaluation  Patient identified by MRN, date of birth, ID band Patient awake    Reviewed: Allergy & Precautions, H&P , NPO status , Patient's Chart, lab work & pertinent test results  History of Anesthesia Complications Negative for: history of anesthetic complications  Airway Mallampati: II TM Distance: >3 FB Neck ROM: Full    Dental  (+) Teeth Intact, Dental Advisory Given   Pulmonary former smoker,    Pulmonary exam normal       Cardiovascular negative cardio ROS      Neuro/Psych negative psych ROS   GI/Hepatic negative GI ROS, Neg liver ROS,   Endo/Other  diabetes, Oral Hypoglycemic Agents  Renal/GU negative Renal ROS     Musculoskeletal  (+) Fibromyalgia -, narcotic dependent  Abdominal   Peds  Hematology   Anesthesia Other Findings   Reproductive/Obstetrics                        Anesthesia Physical Anesthesia Plan  ASA: II  Anesthesia Plan: General   Post-op Pain Management:    Induction:   Airway Management Planned: LMA  Additional Equipment:   Intra-op Plan:   Post-operative Plan: Extubation in OR  Informed Consent: I have reviewed the patients History and Physical, chart, labs and discussed the procedure including the risks, benefits and alternatives for the proposed anesthesia with the patient or authorized representative who has indicated his/her understanding and acceptance.   Dental advisory given  Plan Discussed with: CRNA, Anesthesiologist and Surgeon  Anesthesia Plan Comments:         Anesthesia Quick Evaluation

## 2013-06-25 NOTE — Discharge Instructions (Signed)
Weightbearing as tolerated on the right

## 2013-06-25 NOTE — Transfer of Care (Signed)
Immediate Anesthesia Transfer of Care Note  Patient: Anna [name]  Procedure(s) Performed: Procedure(s): RIGHT ANKLE REMOVAL DEEP HARDWARE (Right)  Patient Location: PACU  Anesthesia Type:GA combined with regional for post-op pain  Level of Consciousness: awake, alert  and oriented  Airway & Oxygen Therapy: Patient Spontanous Breathing and Patient connected to nasal cannula oxygen  Post-op Assessment: Report given to PACU RN and Post -op Vital signs reviewed and stable  Post vital signs: Reviewed and stable  Complications: No apparent anesthesia complications

## 2013-06-25 NOTE — Anesthesia Procedure Notes (Addendum)
Anesthesia Regional Block:  Popliteal block  Pre-Anesthetic Checklist: ,, timeout performed, Correct Patient, Correct Site, Correct Laterality, Correct Procedure, Correct Position, site marked, Risks and benefits discussed,  Surgical consent,  Pre-op evaluation,  At surgeon's request and post-op pain management  Laterality: Right  Prep: chloraprep       Needles:  Injection technique: Single-shot  Needle Type: Echogenic Stimulator Needle          Additional Needles:  Procedures: ultrasound guided (picture in chart) and nerve stimulator Popliteal block  Nerve Stimulator or Paresthesia:  Response: plantar flexion, 0.45 mA,   Additional Responses:   Narrative:  Start time: 06/25/2013 10:51 AM End time: 06/25/2013 11:01 AM  Performed by: Personally  Anesthesiologist: J. Tamela Gammon, MD  Additional Notes: A functioning IV was confirmed and monitors were applied.  Sterile prep and drape, hand hygiene and sterile gloves were used.  Negative aspiration and test dose prior to incremental administration of local anesthetic. The patient tolerated the procedure well.Ultrasound  guidance: relevant anatomy identified, needle position confirmed, local anesthetic spread visualized around nerve(s), vascular puncture avoided.  Image printed for medical record.    Procedure Name: LMA Insertion Date/Time: 06/25/2013 11:56 AM Performed by: Eligha Bridegroom Pre-anesthesia Checklist: Emergency Drugs available, Suction available, Patient being monitored, Timeout performed and Patient identified Patient Re-evaluated:Patient Re-evaluated prior to inductionOxygen Delivery Method: Circle system utilized Preoxygenation: Pre-oxygenation with 100% oxygen Intubation Type: IV induction LMA: LMA inserted LMA Size: 4.0 Placement Confirmation: breath sounds checked- equal and bilateral and positive ETCO2 Tube secured with: Tape Dental Injury: Teeth and Oropharynx as per pre-operative assessment

## 2013-06-26 ENCOUNTER — Encounter (HOSPITAL_COMMUNITY): Payer: Self-pay | Admitting: Orthopedic Surgery

## 2013-12-04 ENCOUNTER — Other Ambulatory Visit (HOSPITAL_COMMUNITY): Payer: 59

## 2013-12-11 ENCOUNTER — Ambulatory Visit (HOSPITAL_COMMUNITY)
Admission: RE | Admit: 2013-12-11 | Discharge: 2013-12-11 | Disposition: A | Discharge status: Home / Self Care | Payer: 59 | Source: Ambulatory Visit | Attending: Vascular Surgery | Admitting: Vascular Surgery

## 2013-12-11 ENCOUNTER — Other Ambulatory Visit (HOSPITAL_COMMUNITY): Payer: Self-pay | Admitting: Internal Medicine

## 2013-12-11 ENCOUNTER — Other Ambulatory Visit: Payer: Self-pay | Admitting: Surgery

## 2013-12-11 DIAGNOSIS — I739 Peripheral vascular disease, unspecified: Secondary | ICD-10-CM | POA: Diagnosis present

## 2014-03-06 ENCOUNTER — Encounter (HOSPITAL_COMMUNITY): Payer: Self-pay

## 2014-03-06 ENCOUNTER — Emergency Department (HOSPITAL_COMMUNITY)
Admission: EM | Admit: 2014-03-06 | Discharge: 2014-03-06 | Disposition: A | Discharge status: Home / Self Care | Payer: 59 | Attending: Emergency Medicine | Admitting: Emergency Medicine

## 2014-03-06 DIAGNOSIS — M1990 Unspecified osteoarthritis, unspecified site: Secondary | ICD-10-CM | POA: Diagnosis not present

## 2014-03-06 DIAGNOSIS — Z792 Long term (current) use of antibiotics: Secondary | ICD-10-CM | POA: Diagnosis not present

## 2014-03-06 DIAGNOSIS — Z87442 Personal history of urinary calculi: Secondary | ICD-10-CM | POA: Insufficient documentation

## 2014-03-06 DIAGNOSIS — Z8679 Personal history of other diseases of the circulatory system: Secondary | ICD-10-CM | POA: Diagnosis not present

## 2014-03-06 DIAGNOSIS — Y92410 Unspecified street and highway as the place of occurrence of the external cause: Secondary | ICD-10-CM | POA: Diagnosis not present

## 2014-03-06 DIAGNOSIS — R030 Elevated blood-pressure reading, without diagnosis of hypertension: Secondary | ICD-10-CM | POA: Diagnosis not present

## 2014-03-06 DIAGNOSIS — Z79899 Other long term (current) drug therapy: Secondary | ICD-10-CM | POA: Insufficient documentation

## 2014-03-06 DIAGNOSIS — E119 Type 2 diabetes mellitus without complications: Secondary | ICD-10-CM | POA: Diagnosis not present

## 2014-03-06 DIAGNOSIS — N189 Chronic kidney disease, unspecified: Secondary | ICD-10-CM | POA: Diagnosis not present

## 2014-03-06 DIAGNOSIS — Y998 Other external cause status: Secondary | ICD-10-CM | POA: Diagnosis not present

## 2014-03-06 DIAGNOSIS — S4991XA Unspecified injury of right shoulder and upper arm, initial encounter: Secondary | ICD-10-CM | POA: Insufficient documentation

## 2014-03-06 DIAGNOSIS — Z87891 Personal history of nicotine dependence: Secondary | ICD-10-CM | POA: Insufficient documentation

## 2014-03-06 DIAGNOSIS — S199XXA Unspecified injury of neck, initial encounter: Secondary | ICD-10-CM | POA: Diagnosis not present

## 2014-03-06 DIAGNOSIS — S0990XA Unspecified injury of head, initial encounter: Secondary | ICD-10-CM | POA: Insufficient documentation

## 2014-03-06 DIAGNOSIS — Z7982 Long term (current) use of aspirin: Secondary | ICD-10-CM | POA: Insufficient documentation

## 2014-03-06 DIAGNOSIS — Y9389 Activity, other specified: Secondary | ICD-10-CM | POA: Insufficient documentation

## 2014-03-06 MED ORDER — HYDROCODONE-ACETAMINOPHEN 5-325 MG PO TABS: 1.0000 | ORAL_TABLET | Freq: Four times a day (QID) | ORAL | Status: DC | PRN

## 2014-03-06 MED ORDER — HYDROCODONE-ACETAMINOPHEN 5-325 MG PO TABS
1.0000 | ORAL_TABLET | Freq: Once | ORAL | Status: AC
  Administered 2014-03-06: 1 via ORAL
  Filled 2014-03-06: qty 1

## 2014-03-06 MED ORDER — METHOCARBAMOL 500 MG PO TABS: 500.0000 mg | ORAL_TABLET | Freq: Two times a day (BID) | ORAL | Status: DC
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
## 2014-03-06 NOTE — ED Notes (Signed)
bp checked at d/c per pt request.  BP elevated bilateral. Manual bp 204/104.  Pt asymptomatic.  Notified [place], [place]

## 2014-03-06 NOTE — ED Notes (Signed)
PA notified of BP. Hold d/c and recheck

## 2014-03-06 NOTE — ED Notes (Signed)
PA in to see patient.
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
## 2014-03-06 NOTE — Discharge Instructions (Signed)

## 2014-03-06 NOTE — ED Notes (Signed)
Notified PA of bp.  PA wants pt to hold longer prior to d/c in order to attempt to decrease bp

## 2015-05-04 ENCOUNTER — Encounter: Payer: Self-pay | Admitting: Internal Medicine

## 2015-12-20 ENCOUNTER — Ambulatory Visit (INDEPENDENT_AMBULATORY_CARE_PROVIDER_SITE_OTHER): Payer: Managed Care, Other (non HMO) | Admitting: Orthopedic Surgery

## 2015-12-20 DIAGNOSIS — M542 Cervicalgia: Secondary | ICD-10-CM | POA: Diagnosis not present

## 2016-01-06 ENCOUNTER — Ambulatory Visit (INDEPENDENT_AMBULATORY_CARE_PROVIDER_SITE_OTHER): Payer: Managed Care, Other (non HMO) | Admitting: Orthopedic Surgery

## 2016-01-12 ENCOUNTER — Ambulatory Visit (INDEPENDENT_AMBULATORY_CARE_PROVIDER_SITE_OTHER): Payer: Managed Care, Other (non HMO) | Admitting: Orthopedic Surgery

## 2016-01-24 ENCOUNTER — Ambulatory Visit (INDEPENDENT_AMBULATORY_CARE_PROVIDER_SITE_OTHER): Payer: Managed Care, Other (non HMO) | Admitting: Orthopedic Surgery

## 2016-01-25 ENCOUNTER — Other Ambulatory Visit: Payer: Self-pay | Admitting: Internal Medicine

## 2016-01-25 DIAGNOSIS — R5381 Other malaise: Secondary | ICD-10-CM

## 2016-01-31 ENCOUNTER — Encounter (INDEPENDENT_AMBULATORY_CARE_PROVIDER_SITE_OTHER): Payer: Self-pay | Admitting: Orthopedic Surgery

## 2016-01-31 ENCOUNTER — Ambulatory Visit (INDEPENDENT_AMBULATORY_CARE_PROVIDER_SITE_OTHER): Payer: Managed Care, Other (non HMO) | Admitting: Orthopedic Surgery

## 2016-01-31 DIAGNOSIS — M792 Neuralgia and neuritis, unspecified: Secondary | ICD-10-CM
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
## 2016-02-09 ENCOUNTER — Telehealth (INDEPENDENT_AMBULATORY_CARE_PROVIDER_SITE_OTHER): Payer: Self-pay | Admitting: Orthopedic Surgery

## 2016-02-09 ENCOUNTER — Other Ambulatory Visit: Payer: Self-pay | Admitting: Internal Medicine

## 2016-02-09 DIAGNOSIS — Z780 Asymptomatic menopausal state: Secondary | ICD-10-CM

## 2016-02-09 NOTE — Telephone Encounter (Signed)
I called patient and advised that her MRI was denied due to failure of conservative treatment and that we needed to do therapy first. Her and her husband were very upset. She states she cannot hardly use her arm and doesn't believe therapy would even be possible in her condition currently. I apologized. They feel like physical therapy is more of a social event and believes she will not get relief. Her husband went through something similar. He spent over 800 dollar on therapy and still had to get the MRI. I apologized again. They are really still wanting to proceed. Advised that I would consult with MD, and [facility] again on Monday and reach back out to them since our office will be closed for holidays. But MRI is going to have to be cancelled this weekend.

## 2016-02-09 NOTE — Telephone Encounter (Signed)
Anna [name] from [facility] called saying Anna [name]'s insurance carrier has denied authorization for the MRI C-spine w/o contrast. Anna [name] is scheduled to have the MRI this coming Saturday. Does Dr. Sharol Given want to have a peer to peer and does he want Anna [name] to proceed in having the MRI? Please contact Anna [name] regarding this.  Anna [name]'s ph# [phone] ext [phone] Thank you.

## 2016-02-09 NOTE — Telephone Encounter (Signed)
Hold on the MRI scan. Call patient and set her up for outpatient physical therapy. We will need to see her back in the office after she has completed the therapy to get insurance to approve the MRI scan.

## 2016-02-12 ENCOUNTER — Inpatient Hospital Stay: Admission: RE | Admit: 2016-02-12 | Payer: Self-pay | Source: Ambulatory Visit

## 2016-02-18 ENCOUNTER — Other Ambulatory Visit: Payer: Self-pay

## 2016-03-07 ENCOUNTER — Telehealth (INDEPENDENT_AMBULATORY_CARE_PROVIDER_SITE_OTHER): Payer: Self-pay

## 2016-03-07 NOTE — Telephone Encounter (Signed)
I called the pt per Dr. Jess Barters request to follow up and see if she had been able to do the out patient physical therapy her insurance required before she could be set up for an MRI of her neck. The pt states that she did not have this done and she would not have this done because it " will not work it didn't work for my husband and it will not work for me" pt states that it is a waste of money and time and that therapy was not offered to her it was straight to the MRI is what was ordered and she was not understanding that her insurance denied the MRI and said that we had to do the conservative treatment first and that this was a decision driven by her insurance company and not by our office I advised to please call if there was anything that we could do for her and he said " yeah bye" and hung up the phone.

## 2016-05-05 ENCOUNTER — Ambulatory Visit (INDEPENDENT_AMBULATORY_CARE_PROVIDER_SITE_OTHER): Payer: Self-pay

## 2016-05-05 ENCOUNTER — Encounter (INDEPENDENT_AMBULATORY_CARE_PROVIDER_SITE_OTHER): Payer: Self-pay | Admitting: Family

## 2016-05-05 ENCOUNTER — Telehealth (INDEPENDENT_AMBULATORY_CARE_PROVIDER_SITE_OTHER): Payer: Self-pay | Admitting: Orthopedic Surgery

## 2016-05-05 ENCOUNTER — Ambulatory Visit (INDEPENDENT_AMBULATORY_CARE_PROVIDER_SITE_OTHER): Payer: Managed Care, Other (non HMO) | Admitting: Family

## 2016-05-05 VITALS — Ht 64.0 in | Wt 176.0 lb

## 2016-05-05 DIAGNOSIS — M5442 Lumbago with sciatica, left side: Secondary | ICD-10-CM | POA: Diagnosis not present

## 2016-05-05 MED ORDER — HYDROCODONE-ACETAMINOPHEN 5-325 MG PO TABS: 1.0000 | ORAL_TABLET | Freq: Four times a day (QID) | ORAL | 0 refills | Status: DC | PRN

## 2016-05-05 MED ORDER — PREDNISONE 10 MG PO TABS: ORAL_TABLET | ORAL | 0 refills | Status: DC
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
## 2016-05-05 NOTE — Telephone Encounter (Signed)
Patient called saying she's in a lot of pain and wondered if she could apply heat or ice to help? She has an appointment this afternoon. CB asap # [phone] Thank you

## 2016-05-05 NOTE — Telephone Encounter (Signed)
I called and spoke with patient. She is having severe pain left shin with radiating pain. This has been ongoing for 2 days She had taken advil without any relief. She is icing and using heat. She has minimized weightbearing. She states she has been doing all of these things and nothing helps. Advised she is doing what she can conservatively at home. Recommended compression. She will come in the office this afternoon.

## 2016-05-25 ENCOUNTER — Ambulatory Visit (INDEPENDENT_AMBULATORY_CARE_PROVIDER_SITE_OTHER): Payer: Managed Care, Other (non HMO) | Admitting: Orthopedic Surgery

## 2016-05-25 ENCOUNTER — Encounter (INDEPENDENT_AMBULATORY_CARE_PROVIDER_SITE_OTHER): Payer: Self-pay | Admitting: Orthopedic Surgery

## 2016-05-25 VITALS — Ht 64.0 in | Wt 176.0 lb

## 2016-05-25 DIAGNOSIS — M47816 Spondylosis without myelopathy or radiculopathy, lumbar region: Secondary | ICD-10-CM | POA: Diagnosis not present
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
## 2016-05-31 ENCOUNTER — Telehealth (INDEPENDENT_AMBULATORY_CARE_PROVIDER_SITE_OTHER): Payer: Self-pay | Admitting: *Deleted

## 2016-05-31 MED ORDER — DIAZEPAM 10 MG PO TABS: ORAL_TABLET | ORAL | 0 refills | Status: DC

## 2016-05-31 NOTE — Telephone Encounter (Signed)
Called in prescription to [facility] per patient request, ok per [facility] standard protocol for MRI premed valium 10 mg 1 po 22minutes - 1 hr prior to procedure. Patient is aware this was sent in to pharmacy.

## 2016-05-31 NOTE — Telephone Encounter (Signed)
Pt called asking for something she can take before her MRI on 3/30 CB: [phone]

## 2016-06-16 ENCOUNTER — Ambulatory Visit
Admission: RE | Admit: 2016-06-16 | Discharge: 2016-06-16 | Disposition: A | Discharge status: Home / Self Care | Payer: Managed Care, Other (non HMO) | Source: Ambulatory Visit | Attending: Orthopedic Surgery | Admitting: Orthopedic Surgery

## 2016-06-16 ENCOUNTER — Other Ambulatory Visit: Payer: Self-pay

## 2016-06-16 DIAGNOSIS — M47816 Spondylosis without myelopathy or radiculopathy, lumbar region: Secondary | ICD-10-CM

## 2016-06-20 ENCOUNTER — Ambulatory Visit (INDEPENDENT_AMBULATORY_CARE_PROVIDER_SITE_OTHER): Payer: Managed Care, Other (non HMO) | Admitting: Orthopedic Surgery

## 2016-06-20 ENCOUNTER — Encounter (INDEPENDENT_AMBULATORY_CARE_PROVIDER_SITE_OTHER): Payer: Self-pay | Admitting: Orthopedic Surgery

## 2016-06-20 VITALS — Ht 64.0 in | Wt 176.0 lb

## 2016-06-20 DIAGNOSIS — M47816 Spondylosis without myelopathy or radiculopathy, lumbar region: Secondary | ICD-10-CM
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
## 2017-11-13 DIAGNOSIS — G4700 Insomnia, unspecified: Secondary | ICD-10-CM | POA: Insufficient documentation

## 2018-01-15 ENCOUNTER — Encounter: Payer: Self-pay | Admitting: Neurology

## 2018-01-17 ENCOUNTER — Encounter: Payer: Self-pay | Admitting: Neurology

## 2018-01-17 ENCOUNTER — Ambulatory Visit (INDEPENDENT_AMBULATORY_CARE_PROVIDER_SITE_OTHER): Payer: Managed Care, Other (non HMO) | Admitting: Neurology

## 2018-01-17 VITALS — BP 176/80 | HR 72 | Ht 63.0 in | Wt 169.5 lb

## 2018-01-17 DIAGNOSIS — R635 Abnormal weight gain: Secondary | ICD-10-CM

## 2018-01-17 DIAGNOSIS — M791 Myalgia, unspecified site: Secondary | ICD-10-CM

## 2018-01-17 DIAGNOSIS — E1165 Type 2 diabetes mellitus with hyperglycemia: Secondary | ICD-10-CM

## 2018-01-17 DIAGNOSIS — R0683 Snoring: Secondary | ICD-10-CM | POA: Diagnosis not present

## 2018-01-17 DIAGNOSIS — R351 Nocturia: Secondary | ICD-10-CM

## 2018-01-17 DIAGNOSIS — L409 Psoriasis, unspecified: Secondary | ICD-10-CM

## 2018-01-17 NOTE — Patient Instructions (Addendum)
Low carb diet. Drink water.  No carbonated beverages.
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
## 2018-02-20 ENCOUNTER — Telehealth: Payer: Self-pay

## 2018-02-20 NOTE — Telephone Encounter (Signed)
Finally able to reach patient. Pt is refusing to schedule at this time. Pt told to call back when ready to schedule.

## 2018-04-04 ENCOUNTER — Ambulatory Visit (INDEPENDENT_AMBULATORY_CARE_PROVIDER_SITE_OTHER): Payer: Managed Care, Other (non HMO) | Admitting: Orthopedic Surgery

## 2018-04-04 ENCOUNTER — Encounter (INDEPENDENT_AMBULATORY_CARE_PROVIDER_SITE_OTHER): Payer: Self-pay | Admitting: Orthopedic Surgery

## 2018-04-04 VITALS — Ht 63.0 in | Wt 169.0 lb

## 2018-04-04 DIAGNOSIS — M5410 Radiculopathy, site unspecified: Secondary | ICD-10-CM

## 2018-04-04 MED ORDER — PREDNISONE 10 MG PO TABS: 20.0000 mg | ORAL_TABLET | Freq: Every day | ORAL | 0 refills | Status: DC

## 2018-04-05 ENCOUNTER — Encounter (INDEPENDENT_AMBULATORY_CARE_PROVIDER_SITE_OTHER): Payer: Self-pay | Admitting: Orthopedic Surgery
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
## 2018-04-25 ENCOUNTER — Ambulatory Visit (INDEPENDENT_AMBULATORY_CARE_PROVIDER_SITE_OTHER): Payer: Managed Care, Other (non HMO) | Admitting: Physical Medicine and Rehabilitation

## 2018-04-25 ENCOUNTER — Ambulatory Visit (INDEPENDENT_AMBULATORY_CARE_PROVIDER_SITE_OTHER): Payer: Self-pay

## 2018-04-25 ENCOUNTER — Encounter (INDEPENDENT_AMBULATORY_CARE_PROVIDER_SITE_OTHER): Payer: Self-pay | Admitting: Physical Medicine and Rehabilitation

## 2018-04-25 VITALS — BP 148/73 | HR 65 | Temp 98.4°F

## 2018-04-25 DIAGNOSIS — M5416 Radiculopathy, lumbar region: Secondary | ICD-10-CM | POA: Diagnosis not present

## 2018-04-25 MED ORDER — DEXAMETHASONE SODIUM PHOSPHATE 10 MG/ML IJ SOLN
15.0000 mg | Freq: Once | INTRAMUSCULAR | Status: DC
Start: 2018-04-25 — End: 2018-04-25

## 2018-04-25 MED ORDER — BETAMETHASONE SOD PHOS & ACET 6 (3-3) MG/ML IJ SUSP
12.0000 mg | Freq: Once | INTRAMUSCULAR | Status: AC
  Administered 2018-04-25: 12 mg

## 2018-04-25 NOTE — Progress Notes (Signed)
 .  Numeric Pain Rating Scale and Functional Assessment Average Pain 10   In the last MONTH (on 0-10 scale) has pain interfered with the following?  1. General activity like being  able to carry out your everyday physical activities such as walking, climbing stairs, carrying groceries, or moving a chair?  Rating(6)   +Driver, -BT, -Dye Allergies.  
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
## 2018-04-26 NOTE — Procedures (Signed)
Lumbosacral Transforaminal Epidural Steroid Injection - Sub-Pedicular Approach with Fluoroscopic Guidance  Patient: Anna [name]      Date of Birth: [birth year] MRN: [id number] PCP: Leanna Battles, MD      Visit Date: 04/25/2018   Universal Protocol:    Date/Time: 04/25/2018  Consent Given By: the patient  Position: PRONE  Additional Comments: Vital signs were monitored before and after the procedure. Patient was prepped and draped in the usual sterile fashion. The correct patient, procedure, and site was verified.   Injection Procedure Details:  Procedure Site One Meds Administered:  Meds ordered this encounter  Medications  . DISCONTD: dexamethasone (DECADRON) injection 15 mg  . betamethasone acetate-betamethasone sodium phosphate (CELESTONE) injection 12 mg    Laterality: Right  Location/Site:  L4-L5  Needle size: 22 G  Needle type: Spinal  Needle Placement: Transforaminal  Findings:    -Comments: Excellent flow of contrast along the nerve and into the epidural space.  Procedure Details: After squaring off the end-plates to get a true AP view, the C-arm was positioned so that an oblique view of the foramen as noted above was visualized. The target area is just inferior to the "nose of the scotty dog" or sub pedicular. The soft tissues overlying this structure were infiltrated with 2-3 ml. of 1% Lidocaine without Epinephrine.  The spinal needle was inserted toward the target using a "trajectory" view along the fluoroscope beam.  Under AP and lateral visualization, the needle was advanced so it did not puncture dura and was located close the 6 O'Clock position of the pedical in AP tracterory. Biplanar projections were used to confirm position. Aspiration was confirmed to be negative for CSF and/or blood. A 1-2 ml. volume of Isovue-250 was injected and flow of contrast was noted at each level. Radiographs were obtained for documentation purposes.   After attaining  the desired flow of contrast documented above, a 0.5 to 1.0 ml test dose of 0.25% Marcaine was injected into each respective transforaminal space.  The patient was observed for 90 seconds post injection.  After no sensory deficits were reported, and normal lower extremity motor function was noted,   the above injectate was administered so that equal amounts of the injectate were placed at each foramen (level) into the transforaminal epidural space.   Additional Comments:  The patient tolerated the procedure well Dressing: Band-Aid    Post-procedure details: Patient was observed during the procedure. Post-procedure instructions were reviewed.  Patient left the clinic in stable condition.

## 2018-05-10 ENCOUNTER — Telehealth (INDEPENDENT_AMBULATORY_CARE_PROVIDER_SITE_OTHER): Payer: Self-pay | Admitting: *Deleted

## 2018-05-13 NOTE — Telephone Encounter (Signed)
Service [phone] Authorization [id number] Auth Effective Date:02/24/2020Auth End Date:05/24/2020Initiated Date:02/24/2020Decision Date:02/24/2020Decision Type :InitialCase Status:Approved

## 2018-05-13 NOTE — Telephone Encounter (Signed)
Pt is scheduled for 05/28/2018 at 1500 with driver.

## 2018-05-13 NOTE — Telephone Encounter (Signed)
Called pt and offered 05/27/2018 appt at 0830, pt declined and states she cannot wait that long. Pt states she has had injections done by Dr. Sharol Given in her thigh and knee. Pt states could Dr. Sharol Given repeat the injections or if [facility] can get her in quicker. Please Advise.

## 2018-05-13 NOTE — Telephone Encounter (Signed)
I called pt and she states that she does not want to be referred to [facility] she wants to keep her care in our office. Pt states that she would wait until the 05/27/18 appt and would for you to call her back and get this scheduled. I had a long discussion with the pt that Dr. Sharol Given does not do ESI injections and that he has never injected her thigh.

## 2018-05-13 NOTE — Telephone Encounter (Signed)
Right L5 vs L2 tf esi

## 2018-05-28 ENCOUNTER — Ambulatory Visit (INDEPENDENT_AMBULATORY_CARE_PROVIDER_SITE_OTHER): Payer: Managed Care, Other (non HMO) | Admitting: Physical Medicine and Rehabilitation

## 2018-05-28 ENCOUNTER — Ambulatory Visit (INDEPENDENT_AMBULATORY_CARE_PROVIDER_SITE_OTHER): Payer: Self-pay

## 2018-05-28 ENCOUNTER — Encounter (INDEPENDENT_AMBULATORY_CARE_PROVIDER_SITE_OTHER): Payer: Self-pay | Admitting: Physical Medicine and Rehabilitation

## 2018-05-28 VITALS — BP 135/75 | HR 77 | Temp 98.5°F

## 2018-05-28 DIAGNOSIS — M48061 Spinal stenosis, lumbar region without neurogenic claudication: Secondary | ICD-10-CM

## 2018-05-28 DIAGNOSIS — M48062 Spinal stenosis, lumbar region with neurogenic claudication: Secondary | ICD-10-CM

## 2018-05-28 DIAGNOSIS — M5416 Radiculopathy, lumbar region: Secondary | ICD-10-CM

## 2018-05-28 DIAGNOSIS — M25551 Pain in right hip: Secondary | ICD-10-CM

## 2018-05-28 MED ORDER — DEXAMETHASONE SODIUM PHOSPHATE 10 MG/ML IJ SOLN
15.0000 mg | Freq: Once | INTRAMUSCULAR | Status: AC
  Administered 2018-05-28: 15 mg

## 2018-05-28 NOTE — Progress Notes (Signed)
 .  Numeric Pain Rating Scale and Functional Assessment Average Pain 8   In the last MONTH (on 0-10 scale) has pain interfered with the following?  1. General activity like being  able to carry out your everyday physical activities such as walking, climbing stairs, carrying groceries, or moving a chair?  Rating(8)   +Driver, -BT, -Dye Allergies.  
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
## 2018-05-29 NOTE — Procedures (Signed)
Lumbosacral Transforaminal Epidural Steroid Injection - Sub-Pedicular Approach with Fluoroscopic Guidance  Patient: Anna [name]      Date of Birth: 1950/08/31 MRN: [id number] PCP: Leanna Battles, MD      Visit Date: 05/28/2018   Universal Protocol:    Date/Time: 05/28/2018  Consent Given By: the patient  Position: PRONE  Additional Comments: Vital signs were monitored before and after the procedure. Patient was prepped and draped in the usual sterile fashion. The correct patient, procedure, and site was verified.   Injection Procedure Details:  Procedure Site One Meds Administered:  Meds ordered this encounter  Medications  . dexamethasone (DECADRON) injection 15 mg    Laterality: Right  Location/Site:  L2-L3  Needle size: 63 G  Needle type: Spinal  Needle Placement: Transforaminal  Findings:    -Comments: Excellent flow of contrast along the nerve and into the epidural space.  Procedure Details: After squaring off the end-plates to get a true AP view, the C-arm was positioned so that an oblique view of the foramen as noted above was visualized. The target area is just inferior to the "nose of the scotty dog" or sub pedicular. The soft tissues overlying this structure were infiltrated with 2-3 ml. of 1% Lidocaine without Epinephrine.  The spinal needle was inserted toward the target using a "trajectory" view along the fluoroscope beam.  Under AP and lateral visualization, the needle was advanced so it did not puncture dura and was located close the 6 O'Clock position of the pedical in AP tracterory. Biplanar projections were used to confirm position. Aspiration was confirmed to be negative for CSF and/or blood. A 1-2 ml. volume of Isovue-250 was injected and flow of contrast was noted at each level. Radiographs were obtained for documentation purposes.   After attaining the desired flow of contrast documented above, a 0.5 to 1.0 ml test dose of 0.25% Marcaine was  injected into each respective transforaminal space.  The patient was observed for 90 seconds post injection.  After no sensory deficits were reported, and normal lower extremity motor function was noted,   the above injectate was administered so that equal amounts of the injectate were placed at each foramen (level) into the transforaminal epidural space.   Additional Comments:  The patient tolerated the procedure well Dressing: 2 x 2 sterile gauze and Band-Aid    Post-procedure details: Patient was observed during the procedure. Post-procedure instructions were reviewed.  Patient left the clinic in stable condition.

## 2018-06-06 ENCOUNTER — Encounter (INDEPENDENT_AMBULATORY_CARE_PROVIDER_SITE_OTHER): Payer: Self-pay | Admitting: Orthopedic Surgery

## 2018-06-06 ENCOUNTER — Other Ambulatory Visit: Payer: Self-pay

## 2018-06-06 ENCOUNTER — Ambulatory Visit (INDEPENDENT_AMBULATORY_CARE_PROVIDER_SITE_OTHER): Payer: Managed Care, Other (non HMO) | Admitting: Orthopedic Surgery

## 2018-06-06 VITALS — Ht 63.0 in | Wt 169.0 lb

## 2018-06-06 DIAGNOSIS — M5410 Radiculopathy, site unspecified: Secondary | ICD-10-CM

## 2018-06-06 MED ORDER — GABAPENTIN 100 MG PO CAPS: 100.0000 mg | ORAL_CAPSULE | Freq: Three times a day (TID) | ORAL | 3 refills | Status: DC

## 2018-06-10 ENCOUNTER — Encounter (INDEPENDENT_AMBULATORY_CARE_PROVIDER_SITE_OTHER): Payer: Self-pay | Admitting: Orthopedic Surgery
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
## 2018-06-13 ENCOUNTER — Telehealth (INDEPENDENT_AMBULATORY_CARE_PROVIDER_SITE_OTHER): Payer: Self-pay

## 2018-06-13 ENCOUNTER — Telehealth (INDEPENDENT_AMBULATORY_CARE_PROVIDER_SITE_OTHER): Payer: Self-pay | Admitting: Orthopedic Surgery

## 2018-06-13 NOTE — Telephone Encounter (Signed)
Patient called advised she is still in a lot of pain. Patient asked who does Dr Sharol Given want to send her to. Patient said the last time she was in the office the conversation was maybe she will be sent to a neurologist. Patient asked if Dr Sharol Given would recommend Dr. Delice Lesch on [facility]. The number to contact patient is [phone]

## 2018-06-13 NOTE — Telephone Encounter (Signed)
I called patient per Dr Sharol Given suggested that she could go to Dr Vertell Limber or physician of her preference and also suggested he could give a pain medication to get her by but patient refused  all medications and suggested to come in on 06/25/2018 at 830am.

## 2018-06-13 NOTE — Telephone Encounter (Signed)
Pt was called and discussed that Dr Sharol Given suggested Dr Ernestina Patches but at this time he can not do any ESI injections and he suggested that pt can be seen by any physician of her preference at this time for a second opinion. I gave her Dr Melven Sartorius number of her choice and offered pain medications but patient stated she can not tolerate any of those narcotics. She has appt for 06/25/2018.

## 2018-06-24 ENCOUNTER — Telehealth (INDEPENDENT_AMBULATORY_CARE_PROVIDER_SITE_OTHER): Payer: Self-pay

## 2018-06-24 NOTE — Telephone Encounter (Signed)
Called pt and answered no to all COVID-19 questions. appt tomorrow @8 :30

## 2018-06-25 ENCOUNTER — Ambulatory Visit (INDEPENDENT_AMBULATORY_CARE_PROVIDER_SITE_OTHER): Payer: Managed Care, Other (non HMO) | Admitting: Physician Assistant

## 2018-06-25 ENCOUNTER — Other Ambulatory Visit: Payer: Self-pay

## 2018-06-25 ENCOUNTER — Encounter (INDEPENDENT_AMBULATORY_CARE_PROVIDER_SITE_OTHER): Payer: Self-pay | Admitting: Orthopedic Surgery

## 2018-06-25 VITALS — Ht 63.0 in | Wt 169.0 lb

## 2018-06-25 DIAGNOSIS — M5410 Radiculopathy, site unspecified: Secondary | ICD-10-CM

## 2018-06-25 MED ORDER — PREDNISONE 10 MG PO TABS: 20.0000 mg | ORAL_TABLET | Freq: Every day | ORAL | 1 refills | Status: DC
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
## 2018-06-27 ENCOUNTER — Ambulatory Visit (INDEPENDENT_AMBULATORY_CARE_PROVIDER_SITE_OTHER): Payer: Managed Care, Other (non HMO) | Admitting: Orthopedic Surgery

## 2018-07-05 ENCOUNTER — Ambulatory Visit
Admission: RE | Admit: 2018-07-05 | Discharge: 2018-07-05 | Disposition: A | Discharge status: Home / Self Care | Payer: Managed Care, Other (non HMO) | Source: Ambulatory Visit | Attending: Physician Assistant | Admitting: Physician Assistant

## 2018-07-05 ENCOUNTER — Other Ambulatory Visit: Payer: Self-pay

## 2018-07-05 DIAGNOSIS — M5410 Radiculopathy, site unspecified: Secondary | ICD-10-CM

## 2018-07-09 ENCOUNTER — Telehealth (INDEPENDENT_AMBULATORY_CARE_PROVIDER_SITE_OTHER): Payer: Self-pay

## 2018-07-09 NOTE — Telephone Encounter (Signed)
Patient left voicemail on triage phone wanted to see if she can get MRI Results for MRI L-Spine. Called patient back and offered her appt sooner with Erin but declined, would like to see Dr. Sharol Given. Please call patient with results.   Next sched appt 4/28-DUDA  CB [phone]

## 2018-07-09 NOTE — Telephone Encounter (Signed)
I called patient and reviewed the MRI scan with her.  Patient states she is miserable and could cry all day she states that she is painful all day long.  Patient states that the injections have not helped.  Please make an appointment with Dr. Louanne Skye and call her with the appointment.

## 2018-07-09 NOTE — Telephone Encounter (Signed)
Can you please review MRI and advise.

## 2018-07-10 NOTE — Telephone Encounter (Signed)
Currently sched for 07/15/18

## 2018-07-10 NOTE — Telephone Encounter (Signed)
Please see message below. Can you please call and make appt for this pt to come and see Dr. Louanne Skye s/p MRI, ESI not helpful and wants to discuss possible surgical intervention. Thanks!

## 2018-07-11 ENCOUNTER — Other Ambulatory Visit (INDEPENDENT_AMBULATORY_CARE_PROVIDER_SITE_OTHER): Payer: Self-pay | Admitting: Physician Assistant

## 2018-07-11 ENCOUNTER — Telehealth (INDEPENDENT_AMBULATORY_CARE_PROVIDER_SITE_OTHER): Payer: Self-pay | Admitting: Orthopedic Surgery

## 2018-07-11 DIAGNOSIS — M5410 Radiculopathy, site unspecified: Secondary | ICD-10-CM

## 2018-07-11 NOTE — Telephone Encounter (Signed)
Please send in if you want patient to have refill of this?

## 2018-07-11 NOTE — Telephone Encounter (Signed)
Patient called requesting an RX refill on her Prednisone.  She uses [facility] [address].  CB#[phone].  Thank you.

## 2018-07-12 ENCOUNTER — Other Ambulatory Visit (INDEPENDENT_AMBULATORY_CARE_PROVIDER_SITE_OTHER): Payer: Self-pay

## 2018-07-12 ENCOUNTER — Ambulatory Visit (INDEPENDENT_AMBULATORY_CARE_PROVIDER_SITE_OTHER): Payer: Managed Care, Other (non HMO) | Admitting: Specialist

## 2018-07-12 DIAGNOSIS — M5410 Radiculopathy, site unspecified: Secondary | ICD-10-CM

## 2018-07-12 MED ORDER — PREDNISONE 10 MG PO TABS: ORAL_TABLET | ORAL | 0 refills | Status: DC

## 2018-07-12 NOTE — Telephone Encounter (Signed)
Patient was called and sent in Rx

## 2018-07-15 ENCOUNTER — Encounter (INDEPENDENT_AMBULATORY_CARE_PROVIDER_SITE_OTHER): Payer: Self-pay | Admitting: Specialist

## 2018-07-15 ENCOUNTER — Ambulatory Visit (INDEPENDENT_AMBULATORY_CARE_PROVIDER_SITE_OTHER): Payer: Self-pay

## 2018-07-15 ENCOUNTER — Other Ambulatory Visit: Payer: Self-pay

## 2018-07-15 ENCOUNTER — Ambulatory Visit (INDEPENDENT_AMBULATORY_CARE_PROVIDER_SITE_OTHER): Payer: Managed Care, Other (non HMO) | Admitting: Specialist

## 2018-07-15 VITALS — BP 124/73 | HR 80 | Ht 63.0 in | Wt 169.0 lb

## 2018-07-15 DIAGNOSIS — M5410 Radiculopathy, site unspecified: Secondary | ICD-10-CM

## 2018-07-15 DIAGNOSIS — M7061 Trochanteric bursitis, right hip: Secondary | ICD-10-CM

## 2018-07-15 DIAGNOSIS — M4726 Other spondylosis with radiculopathy, lumbar region: Secondary | ICD-10-CM

## 2018-07-15 DIAGNOSIS — M533 Sacrococcygeal disorders, not elsewhere classified: Secondary | ICD-10-CM

## 2018-07-15 DIAGNOSIS — G8929 Other chronic pain: Secondary | ICD-10-CM
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
## 2018-07-16 ENCOUNTER — Ambulatory Visit (INDEPENDENT_AMBULATORY_CARE_PROVIDER_SITE_OTHER): Payer: Managed Care, Other (non HMO) | Admitting: Orthopedic Surgery

## 2018-07-19 ENCOUNTER — Ambulatory Visit (INDEPENDENT_AMBULATORY_CARE_PROVIDER_SITE_OTHER): Payer: Managed Care, Other (non HMO) | Admitting: Family Medicine

## 2018-07-19 ENCOUNTER — Other Ambulatory Visit: Payer: Self-pay

## 2018-07-19 ENCOUNTER — Encounter (INDEPENDENT_AMBULATORY_CARE_PROVIDER_SITE_OTHER): Payer: Self-pay | Admitting: Family Medicine

## 2018-07-19 DIAGNOSIS — M533 Sacrococcygeal disorders, not elsewhere classified: Secondary | ICD-10-CM | POA: Diagnosis not present

## 2018-07-19 DIAGNOSIS — M7061 Trochanteric bursitis, right hip: Secondary | ICD-10-CM | POA: Diagnosis not present

## 2018-07-19 DIAGNOSIS — G8929 Other chronic pain: Secondary | ICD-10-CM | POA: Diagnosis not present

## 2018-07-19 MED ORDER — METHYLPREDNISOLONE ACETATE 40 MG/ML IJ SUSP: 40.0000 mg | Freq: Once | INTRAMUSCULAR | Status: DC

## 2018-07-19 NOTE — Progress Notes (Signed)
Subjective: She is here for right-sided greater trochanter injection and right-sided SI joint area injection.  Blood sugar today in clinic was 256.  Objective: Point tender over the right greater trochanter, posterior aspect.  Tender near the right side inferior aspect of the sacrum in the piriformis region.  A little bit of tenderness more proximally at the SI joint, but not as much.  Procedure: Ultrasound-guided right piriformis injection and greater trochanter injection: After sterile prep with Betadine, injected 8 cc 1% lidocaine without epinephrine and 40 mg methylprednisolone into each of the above-mentioned areas using ultrasound to guide needle placement to avoid neurovascular structures.  She will follow-up as directed, watch blood sugars closely.

## 2018-07-24 ENCOUNTER — Telehealth: Payer: Self-pay | Admitting: Orthopedic Surgery

## 2018-07-24 ENCOUNTER — Other Ambulatory Visit: Payer: Self-pay

## 2018-07-24 ENCOUNTER — Telehealth: Payer: Self-pay | Admitting: Specialist

## 2018-07-24 DIAGNOSIS — M533 Sacrococcygeal disorders, not elsewhere classified: Principal | ICD-10-CM

## 2018-07-24 DIAGNOSIS — M4726 Other spondylosis with radiculopathy, lumbar region: Secondary | ICD-10-CM

## 2018-07-24 DIAGNOSIS — G8929 Other chronic pain: Secondary | ICD-10-CM

## 2018-07-24 NOTE — Telephone Encounter (Signed)
Ok per Dr. Sharol Given to make referral to neuro surg group as pt has requested see message below. This referral printed out and I wanted to make sure that the order gets to you. Please let me know if there is anything that I can do. Thanks!

## 2018-07-24 NOTE — Telephone Encounter (Signed)
Pt called asking if she can have a referral to Dr Sherley Bounds at [facility]

## 2018-07-30 NOTE — Telephone Encounter (Signed)
The order to neurosurgery never came in my workqueue.

## 2018-07-30 NOTE — Telephone Encounter (Signed)
I am not sure where it went then? Its for [facility] and the referral is in her chart for Dr. Ronnald Ramp. Is there something that I need to do?

## 2018-08-02 ENCOUNTER — Ambulatory Visit: Payer: Self-pay | Admitting: Specialist

## 2018-10-10 DIAGNOSIS — M7060 Trochanteric bursitis, unspecified hip: Secondary | ICD-10-CM | POA: Insufficient documentation

## 2019-01-09 ENCOUNTER — Emergency Department (HOSPITAL_COMMUNITY): Payer: Managed Care, Other (non HMO)

## 2019-01-09 ENCOUNTER — Encounter (HOSPITAL_COMMUNITY): Payer: Self-pay | Admitting: Emergency Medicine

## 2019-01-09 ENCOUNTER — Inpatient Hospital Stay (HOSPITAL_COMMUNITY): Payer: Managed Care, Other (non HMO)

## 2019-01-09 ENCOUNTER — Inpatient Hospital Stay (HOSPITAL_COMMUNITY)
Admission: EM | Admit: 2019-01-09 | Discharge: 2019-01-14 | DRG: 872 | Disposition: A | Discharge status: Home Health | Payer: Managed Care, Other (non HMO) | Attending: Family Medicine | Admitting: Family Medicine

## 2019-01-09 ENCOUNTER — Other Ambulatory Visit: Payer: Self-pay

## 2019-01-09 DIAGNOSIS — E872 Acidosis: Secondary | ICD-10-CM | POA: Diagnosis not present

## 2019-01-09 DIAGNOSIS — M47816 Spondylosis without myelopathy or radiculopathy, lumbar region: Secondary | ICD-10-CM | POA: Diagnosis present

## 2019-01-09 DIAGNOSIS — E876 Hypokalemia: Secondary | ICD-10-CM | POA: Diagnosis not present

## 2019-01-09 DIAGNOSIS — G2581 Restless legs syndrome: Secondary | ICD-10-CM | POA: Diagnosis present

## 2019-01-09 DIAGNOSIS — Z7982 Long term (current) use of aspirin: Secondary | ICD-10-CM

## 2019-01-09 DIAGNOSIS — G8929 Other chronic pain: Secondary | ICD-10-CM | POA: Diagnosis not present

## 2019-01-09 DIAGNOSIS — Z7984 Long term (current) use of oral hypoglycemic drugs: Secondary | ICD-10-CM

## 2019-01-09 DIAGNOSIS — Z833 Family history of diabetes mellitus: Secondary | ICD-10-CM

## 2019-01-09 DIAGNOSIS — Z888 Allergy status to other drugs, medicaments and biological substances status: Secondary | ICD-10-CM

## 2019-01-09 DIAGNOSIS — I10 Essential (primary) hypertension: Secondary | ICD-10-CM | POA: Diagnosis not present

## 2019-01-09 DIAGNOSIS — E861 Hypovolemia: Secondary | ICD-10-CM | POA: Diagnosis present

## 2019-01-09 DIAGNOSIS — M5440 Lumbago with sciatica, unspecified side: Secondary | ICD-10-CM | POA: Diagnosis not present

## 2019-01-09 DIAGNOSIS — W19XXXA Unspecified fall, initial encounter: Secondary | ICD-10-CM

## 2019-01-09 DIAGNOSIS — N179 Acute kidney failure, unspecified: Secondary | ICD-10-CM | POA: Diagnosis not present

## 2019-01-09 DIAGNOSIS — N390 Urinary tract infection, site not specified: Secondary | ICD-10-CM | POA: Diagnosis not present

## 2019-01-09 DIAGNOSIS — Y92009 Unspecified place in unspecified non-institutional (private) residence as the place of occurrence of the external cause: Secondary | ICD-10-CM | POA: Diagnosis not present

## 2019-01-09 DIAGNOSIS — E119 Type 2 diabetes mellitus without complications: Secondary | ICD-10-CM | POA: Diagnosis present

## 2019-01-09 DIAGNOSIS — A419 Sepsis, unspecified organism: Secondary | ICD-10-CM | POA: Diagnosis present

## 2019-01-09 DIAGNOSIS — Z20828 Contact with and (suspected) exposure to other viral communicable diseases: Secondary | ICD-10-CM | POA: Diagnosis present

## 2019-01-09 DIAGNOSIS — E785 Hyperlipidemia, unspecified: Secondary | ICD-10-CM | POA: Diagnosis present

## 2019-01-09 DIAGNOSIS — A4181 Sepsis due to Enterococcus: Secondary | ICD-10-CM | POA: Diagnosis present

## 2019-01-09 DIAGNOSIS — M545 Low back pain, unspecified: Secondary | ICD-10-CM

## 2019-01-09 DIAGNOSIS — E871 Hypo-osmolality and hyponatremia: Secondary | ICD-10-CM | POA: Diagnosis present

## 2019-01-09 DIAGNOSIS — L409 Psoriasis, unspecified: Secondary | ICD-10-CM | POA: Diagnosis present

## 2019-01-09 DIAGNOSIS — Z8249 Family history of ischemic heart disease and other diseases of the circulatory system: Secondary | ICD-10-CM

## 2019-01-09 DIAGNOSIS — M797 Fibromyalgia: Secondary | ICD-10-CM | POA: Diagnosis present

## 2019-01-09 DIAGNOSIS — E1165 Type 2 diabetes mellitus with hyperglycemia: Secondary | ICD-10-CM | POA: Diagnosis present

## 2019-01-09 DIAGNOSIS — M549 Dorsalgia, unspecified: Secondary | ICD-10-CM | POA: Diagnosis present

## 2019-01-09 DIAGNOSIS — F17210 Nicotine dependence, cigarettes, uncomplicated: Secondary | ICD-10-CM | POA: Diagnosis present

## 2019-01-09 LAB — CBC WITH DIFFERENTIAL/PLATELET
Abs Immature Granulocytes: 0.34 10*3/uL — ABNORMAL HIGH (ref 0.00–0.07)
Basophils Absolute: 0.1 10*3/uL (ref 0.0–0.1)
Basophils Relative: 0 %
Eosinophils Absolute: 0 10*3/uL (ref 0.0–0.5)
Eosinophils Relative: 0 %
HCT: 40.2 % (ref 36.0–46.0)
Hemoglobin: 13.4 g/dL (ref 12.0–15.0)
Immature Granulocytes: 2 %
Lymphocytes Relative: 6 %
Lymphs Abs: 1.3 10*3/uL (ref 0.7–4.0)
MCH: 29.1 pg (ref 26.0–34.0)
MCHC: 33.3 g/dL (ref 30.0–36.0)
MCV: 87.4 fL (ref 80.0–100.0)
Monocytes Absolute: 2.2 10*3/uL — ABNORMAL HIGH (ref 0.1–1.0)
Monocytes Relative: 9 %
Neutro Abs: 19.4 10*3/uL — ABNORMAL HIGH (ref 1.7–7.7)
Neutrophils Relative %: 83 %
Platelets: 340 10*3/uL (ref 150–400)
RBC: 4.6 MIL/uL (ref 3.87–5.11)
RDW: 13.9 % (ref 11.5–15.5)
WBC: 23.2 10*3/uL — ABNORMAL HIGH (ref 4.0–10.5)
nRBC: 0 % (ref 0.0–0.2)

## 2019-01-09 LAB — URINALYSIS, ROUTINE W REFLEX MICROSCOPIC
Bilirubin Urine: NEGATIVE
Glucose, UA: 50 mg/dL — AB
Ketones, ur: NEGATIVE mg/dL
Nitrite: NEGATIVE
Protein, ur: 100 mg/dL — AB
Specific Gravity, Urine: 1.02 (ref 1.005–1.030)
WBC, UA: >50 WBC/hpf — ABNORMAL HIGH (ref 0–5)
pH: 5 (ref 5.0–8.0)

## 2019-01-09 LAB — COMPREHENSIVE METABOLIC PANEL
ALT: 26 U/L (ref 0–44)
AST: 25 U/L (ref 15–41)
Albumin: 3 g/dL — ABNORMAL LOW (ref 3.5–5.0)
Alkaline Phosphatase: 107 U/L (ref 38–126)
Anion gap: 12 (ref 5–15)
BUN: 23 mg/dL (ref 8–23)
CO2: 24 mmol/L (ref 22–32)
Calcium: 8.8 mg/dL — ABNORMAL LOW (ref 8.9–10.3)
Chloride: 96 mmol/L — ABNORMAL LOW (ref 98–111)
Creatinine, Ser: 1.87 mg/dL — ABNORMAL HIGH (ref 0.44–1.00)
GFR calc Af Amer: 31 mL/min — ABNORMAL LOW (ref >60)
GFR calc non Af Amer: 27 mL/min — ABNORMAL LOW (ref >60)
Glucose, Bld: 214 mg/dL — ABNORMAL HIGH (ref 70–99)
Potassium: 4 mmol/L (ref 3.5–5.1)
Sodium: 132 mmol/L — ABNORMAL LOW (ref 135–145)
Total Bilirubin: 1 mg/dL (ref 0.3–1.2)
Total Protein: 6.8 g/dL (ref 6.5–8.1)

## 2019-01-09 LAB — LACTIC ACID, PLASMA
Lactic Acid, Venous: 1.8 mmol/L (ref 0.5–1.9)
Lactic Acid, Venous: 1.9 mmol/L (ref 0.5–1.9)

## 2019-01-09 LAB — HIV ANTIBODY (ROUTINE TESTING W REFLEX): HIV Screen 4th Generation wRfx: NONREACTIVE

## 2019-01-09 LAB — HEMOGLOBIN A1C
Hgb A1c MFr Bld: 8.9 % — ABNORMAL HIGH (ref 4.8–5.6)
Mean Plasma Glucose: 208.73 mg/dL

## 2019-01-09 LAB — SARS CORONAVIRUS 2 BY RT PCR (HOSPITAL ORDER, PERFORMED IN ~~LOC~~ HOSPITAL LAB): SARS Coronavirus 2: NEGATIVE

## 2019-01-09 LAB — GLUCOSE, CAPILLARY: Glucose-Capillary: 104 mg/dL — ABNORMAL HIGH (ref 70–99)

## 2019-01-09 MED ORDER — SODIUM CHLORIDE 0.9 % IV SOLN
2.0000 g | Freq: Once | INTRAVENOUS | Status: AC
  Administered 2019-01-09: 2 g via INTRAVENOUS
  Filled 2019-01-09: qty 2

## 2019-01-09 MED ORDER — ACETAMINOPHEN 325 MG PO TABS
650.0000 mg | ORAL_TABLET | Freq: Once | ORAL | Status: AC
  Administered 2019-01-09: 650 mg via ORAL
  Filled 2019-01-09: qty 2

## 2019-01-09 MED ORDER — SODIUM CHLORIDE 0.9 % IV SOLN
2.0000 g | INTRAVENOUS | Status: DC
  Administered 2019-01-10 – 2019-01-12 (×3): 2 g via INTRAVENOUS
  Filled 2019-01-09 (×3): qty 2

## 2019-01-09 MED ORDER — ASPIRIN EC 81 MG PO TBEC
81.0000 mg | DELAYED_RELEASE_TABLET | Freq: Every day | ORAL | Status: DC
  Administered 2019-01-09 – 2019-01-10 (×2): 81 mg via ORAL
  Filled 2019-01-09 (×2): qty 1

## 2019-01-09 MED ORDER — GABAPENTIN 100 MG PO CAPS
100.0000 mg | ORAL_CAPSULE | Freq: Three times a day (TID) | ORAL | Status: DC | PRN
  Administered 2019-01-11 – 2019-01-12 (×3): 100 mg via ORAL
  Filled 2019-01-09 (×3): qty 1

## 2019-01-09 MED ORDER — MORPHINE SULFATE (PF) 4 MG/ML IV SOLN
4.0000 mg | Freq: Once | INTRAVENOUS | Status: AC
  Administered 2019-01-09: 4 mg via INTRAVENOUS
  Filled 2019-01-09: qty 1

## 2019-01-09 MED ORDER — ENOXAPARIN SODIUM 30 MG/0.3ML ~~LOC~~ SOLN
30.0000 mg | SUBCUTANEOUS | Status: DC
  Administered 2019-01-09 – 2019-01-10 (×2): 30 mg via SUBCUTANEOUS
  Filled 2019-01-09 (×2): qty 0.3

## 2019-01-09 MED ORDER — TRAMADOL HCL 50 MG PO TABS
50.0000 mg | ORAL_TABLET | Freq: Once | ORAL | Status: AC
  Administered 2019-01-09: 50 mg via ORAL
  Filled 2019-01-09: qty 1

## 2019-01-09 MED ORDER — AMLODIPINE BESYLATE 5 MG PO TABS
5.0000 mg | ORAL_TABLET | Freq: Every day | ORAL | Status: DC
  Administered 2019-01-09: 5 mg via ORAL
  Filled 2019-01-09: qty 1

## 2019-01-09 MED ORDER — VANCOMYCIN HCL IN DEXTROSE 1-5 GM/200ML-% IV SOLN: 1000.0000 mg | Freq: Once | INTRAVENOUS | Status: DC

## 2019-01-09 MED ORDER — METRONIDAZOLE IN NACL 5-0.79 MG/ML-% IV SOLN: 500.0000 mg | Freq: Three times a day (TID) | INTRAVENOUS | Status: DC

## 2019-01-09 MED ORDER — INSULIN ASPART 100 UNIT/ML ~~LOC~~ SOLN: 0.0000 [IU] | Freq: Every day | SUBCUTANEOUS | Status: DC

## 2019-01-09 MED ORDER — ALBUTEROL SULFATE (2.5 MG/3ML) 0.083% IN NEBU: 2.5000 mg | INHALATION_SOLUTION | Freq: Four times a day (QID) | RESPIRATORY_TRACT | Status: DC | PRN

## 2019-01-09 MED ORDER — ONDANSETRON HCL 4 MG/2ML IJ SOLN: 4.0000 mg | Freq: Four times a day (QID) | INTRAMUSCULAR | Status: DC | PRN

## 2019-01-09 MED ORDER — VANCOMYCIN HCL 10 G IV SOLR: 1250.0000 mg | INTRAVENOUS | Status: DC

## 2019-01-09 MED ORDER — ATORVASTATIN CALCIUM 40 MG PO TABS
40.0000 mg | ORAL_TABLET | Freq: Every day | ORAL | Status: DC
  Administered 2019-01-10 – 2019-01-13 (×4): 40 mg via ORAL
  Filled 2019-01-09 (×4): qty 1

## 2019-01-09 MED ORDER — LACTATED RINGERS IV BOLUS
1000.0000 mL | Freq: Once | INTRAVENOUS | Status: AC
  Administered 2019-01-09: 1000 mL via INTRAVENOUS

## 2019-01-09 MED ORDER — METRONIDAZOLE IN NACL 5-0.79 MG/ML-% IV SOLN
500.0000 mg | Freq: Once | INTRAVENOUS | Status: AC
  Administered 2019-01-09: 500 mg via INTRAVENOUS
  Filled 2019-01-09: qty 100

## 2019-01-09 MED ORDER — SODIUM CHLORIDE 0.9 % IV SOLN: 2.0000 g | INTRAVENOUS | Status: DC

## 2019-01-09 MED ORDER — INSULIN ASPART 100 UNIT/ML ~~LOC~~ SOLN
0.0000 [IU] | Freq: Three times a day (TID) | SUBCUTANEOUS | Status: DC
  Administered 2019-01-10: 3 [IU] via SUBCUTANEOUS
  Administered 2019-01-10: 1 [IU] via SUBCUTANEOUS
  Administered 2019-01-11: 2 [IU] via SUBCUTANEOUS
  Administered 2019-01-12: 1 [IU] via SUBCUTANEOUS
  Administered 2019-01-12: 5 [IU] via SUBCUTANEOUS
  Administered 2019-01-12 – 2019-01-13 (×2): 2 [IU] via SUBCUTANEOUS
  Administered 2019-01-13: 9 [IU] via SUBCUTANEOUS
  Administered 2019-01-13: 2 [IU] via SUBCUTANEOUS
  Administered 2019-01-14: 1 [IU] via SUBCUTANEOUS

## 2019-01-09 MED ORDER — VANCOMYCIN HCL 10 G IV SOLR
1500.0000 mg | Freq: Once | INTRAVENOUS | Status: AC
  Administered 2019-01-09: 1500 mg via INTRAVENOUS
  Filled 2019-01-09: qty 1500

## 2019-01-09 MED ORDER — ACETAMINOPHEN 325 MG PO TABS
650.0000 mg | ORAL_TABLET | Freq: Four times a day (QID) | ORAL | Status: DC | PRN
  Administered 2019-01-12: 650 mg via ORAL
  Filled 2019-01-09: qty 2

## 2019-01-09 MED ORDER — SODIUM CHLORIDE 0.9 % IV SOLN
INTRAVENOUS | Status: DC
  Administered 2019-01-09: via INTRAVENOUS

## 2019-01-09 MED ORDER — ACETAMINOPHEN 650 MG RE SUPP: 650.0000 mg | Freq: Four times a day (QID) | RECTAL | Status: DC | PRN

## 2019-01-09 MED ORDER — ONDANSETRON HCL 4 MG PO TABS: 4.0000 mg | ORAL_TABLET | Freq: Four times a day (QID) | ORAL | Status: DC | PRN

## 2019-01-09 MED ORDER — GABAPENTIN 100 MG PO CAPS: 100.0000 mg | ORAL_CAPSULE | Freq: Three times a day (TID) | ORAL | Status: DC

## 2019-01-09 MED ORDER — HYDROCODONE-ACETAMINOPHEN 5-325 MG PO TABS
1.0000 | ORAL_TABLET | Freq: Four times a day (QID) | ORAL | Status: DC | PRN
  Administered 2019-01-12 – 2019-01-13 (×3): 1 via ORAL
  Filled 2019-01-09 (×3): qty 1
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
## 2019-01-09 NOTE — ED Notes (Signed)
Patient transported to CT 
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
## 2019-01-09 NOTE — ED Triage Notes (Signed)
Patient arrived with EMS from home reports low back pain for 2 days with bilateral legs weakness/fatigue , she fell twice today , fever at arrival with chills .

## 2019-01-09 NOTE — ED Notes (Signed)
Patient transported to MRI 
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
## 2019-01-09 NOTE — ED Provider Notes (Addendum)
I saw and evaluated the patient, reviewed the resident's note and I agree with the findings and plan.  EKG: EKG Interpretation  Date/Time:  Thursday January 09 2019 12:20:46 EDT Ventricular Rate:  73 PR Interval:    QRS Duration: 93 QT Interval:  391 QTC Calculation: 431 R Axis:   90 Text Interpretation:  Sinus rhythm Consider left atrial enlargement Borderline right axis deviation Since last tracing rate slower Confirmed by Dorie Rank [id number]) on 01/09/2019 1:23:58 PM  CRITICAL CARE Performed by: Dorie Rank Total critical care time: 35 minutes Critical care time was exclusive of separately billable procedures and treating other patients. Critical care was necessary to treat or prevent imminent or life-threatening deterioration. Critical care was time spent personally by me on the following activities: development of treatment plan with patient and/or surrogate as well as nursing, discussions with consultants, evaluation of patient's response to treatment, examination of patient, obtaining history from patient or surrogate, ordering and performing treatments and interventions, ordering and review of laboratory studies, ordering and review of radiographic studies, pulse oximetry and re-evaluation of patient's condition. Final diagnoses:  Fall, initial encounter  Bilateral low back pain, unspecified chronicity, unspecified whether sciatica present  Sepsis, due to unspecified organism, unspecified whether acute organ dysfunction present [facility])        Dorie Rank, MD 01/10/19 [id number]    Dorie Rank, MD 01/23/19 1004

## 2019-01-09 NOTE — Progress Notes (Signed)
Pharmacy Antibiotic Note  Anna [name] is a 68 y.o. female admitted on 01/09/2019 with sepsis. Pharmacy has been consulted for vancomycin and cefepime dosing. Pt is febrile with Tmax 102.2 and WBC is elevated at 23.2. Scr is elevated at 1.87.   Plan: Vancomycin 1500mg  IV x 1 then 1250mg  IV Q48H Cefepime 2gm IV Q24H F/u renal fxn, C&S, clinical status and peak/trough at SS  Height: 5\' 3"  (160 cm) Weight: 165 lb (74.8 kg) IBW/kg (Calculated) : 52.4  Temp (24hrs), Avg:100.3 F (37.9 C), Min:98.4 F (36.9 C), Max:102.2 F (39 C)  Recent Labs  Lab 01/09/19 0515  WBC 23.2*  CREATININE 1.87*    Estimated Creatinine Clearance: 27.1 mL/min (A) (by C-G formula based on SCr of 1.87 mg/dL (H)).    Allergies  Allergen Reactions  . Benadryl [Diphenhydramine Hcl] Other (See Comments)    chills    Antimicrobials this admission: Vanc 10/22>> Cefepime 10/22>> Flagyl x 1 10/22  Dose adjustments this admission: N/A  Microbiology results: Pending  Thank you for allowing pharmacy to be a part of this patient's care.  Gayle Collard, Rande Lawman 01/09/2019 11:00 AM

## 2019-01-10 DIAGNOSIS — I10 Essential (primary) hypertension: Secondary | ICD-10-CM | POA: Diagnosis not present

## 2019-01-10 DIAGNOSIS — A419 Sepsis, unspecified organism: Secondary | ICD-10-CM | POA: Diagnosis not present

## 2019-01-10 DIAGNOSIS — E871 Hypo-osmolality and hyponatremia: Secondary | ICD-10-CM | POA: Diagnosis not present

## 2019-01-10 DIAGNOSIS — N179 Acute kidney failure, unspecified: Secondary | ICD-10-CM | POA: Diagnosis not present

## 2019-01-10 LAB — CBC
HCT: 33.3 % — ABNORMAL LOW (ref 36.0–46.0)
Hemoglobin: 11.2 g/dL — ABNORMAL LOW (ref 12.0–15.0)
MCH: 28.7 pg (ref 26.0–34.0)
MCHC: 33.6 g/dL (ref 30.0–36.0)
MCV: 85.4 fL (ref 80.0–100.0)
Platelets: 303 10*3/uL (ref 150–400)
RBC: 3.9 MIL/uL (ref 3.87–5.11)
RDW: 14.1 % (ref 11.5–15.5)
WBC: 15.9 10*3/uL — ABNORMAL HIGH (ref 4.0–10.5)
nRBC: 0 % (ref 0.0–0.2)

## 2019-01-10 LAB — GLUCOSE, CAPILLARY
Glucose-Capillary: 150 mg/dL — ABNORMAL HIGH (ref 70–99)
Glucose-Capillary: 115 mg/dL — ABNORMAL HIGH (ref 70–99)
Glucose-Capillary: 201 mg/dL — ABNORMAL HIGH (ref 70–99)
Glucose-Capillary: 124 mg/dL — ABNORMAL HIGH (ref 70–99)

## 2019-01-10 LAB — BASIC METABOLIC PANEL
Anion gap: 11 (ref 5–15)
BUN: 25 mg/dL — ABNORMAL HIGH (ref 8–23)
CO2: 22 mmol/L (ref 22–32)
Calcium: 8.2 mg/dL — ABNORMAL LOW (ref 8.9–10.3)
Chloride: 101 mmol/L (ref 98–111)
Creatinine, Ser: 1.75 mg/dL — ABNORMAL HIGH (ref 0.44–1.00)
GFR calc Af Amer: 34 mL/min — ABNORMAL LOW (ref >60)
GFR calc non Af Amer: 29 mL/min — ABNORMAL LOW (ref >60)
Glucose, Bld: 101 mg/dL — ABNORMAL HIGH (ref 70–99)
Potassium: 3.4 mmol/L — ABNORMAL LOW (ref 3.5–5.1)
Sodium: 134 mmol/L — ABNORMAL LOW (ref 135–145)

## 2019-01-10 LAB — SODIUM, URINE, RANDOM: Sodium, Ur: 43 mmol/L

## 2019-01-10 LAB — PROTEIN / CREATININE RATIO, URINE
Creatinine, Urine: 100.74 mg/dL
Protein Creatinine Ratio: 1.85 mg/mg{Cre} — ABNORMAL HIGH (ref 0.00–0.15)
Total Protein, Urine: 186 mg/dL

## 2019-01-10 LAB — LACTIC ACID, PLASMA: Lactic Acid, Venous: 0.8 mmol/L (ref 0.5–1.9)

## 2019-01-10 MED ORDER — SODIUM CHLORIDE 0.9 % IV BOLUS
500.0000 mL | Freq: Once | INTRAVENOUS | Status: AC
  Administered 2019-01-10: 500 mL via INTRAVENOUS

## 2019-01-10 MED ORDER — POTASSIUM CHLORIDE IN NACL 20-0.9 MEQ/L-% IV SOLN
INTRAVENOUS | Status: DC
  Administered 2019-01-10 – 2019-01-11 (×2): via INTRAVENOUS
  Filled 2019-01-10 (×3): qty 1000

## 2019-01-10 NOTE — Progress Notes (Signed)
PROGRESS NOTE    Anna [name]  [id number] DOB: 01-26-1951 DOA: 01/09/2019 PCP: Leanna Battles, MD      Brief Narrative:  Anna [name] is a 68 y.o. F with fibromyalgia, HTN DM, chronic low back pain, restless leg syndrome and previous back surgery who presented with fall.    2 days PTA, patient fell, landing on buttocks.  Since then, has had numbness tingling and inability to walk.    In ER, patient febrile 102.61F, tachycardic, WBC 23K, Cr 1.87.  Started on broad spectrum antibiotics.    Assessment & Plan:  Sepsis from UTI  Presented with fever, tachycardia, and renal failure.  CXR nonspecific.  UA shows pyuria, bacteriuria.  MR lumbar spine showed renal stranding.  US shows no evidence of stone or hydro.  This morning, still febrile overnight, and BP 80/63 this AM. -Repeat lactic acid -Bolus fluids -Narrow to ceftraixone given source UTI based on UA, imaging of right kidney   Back pain Fall Numbness Lumbar x-ray showed only old compression fx.  CT C-spine showed degenerative changes only.  MRI lumbar spine showed no infection, only some L3-4 foraminal RIGHT stenosis and LEFT facet arthritis at L4-S1 -PT eval  AKI Patient notes no previous history of chronic kidney disease.  Baseline creatinine in our system several years ago, less than 1. Renal US shows cortical thinning but no hydro or overt medicorenal disease. -Continue IV fluids -Check urine electrolytes -Avoid hypotension, nephrotoxins  Hyponatremia Na no change overnight, mild asymptomatic  Diabetes A1c 8.9% -Continue SSI corrections -Continue aspirin, atorvastatin -Hold metformin   Hypokalemia Mild, will defer supplement for now given AKI  Hypertension BP low this mroning -IV fluids -Hold amlodipine            MDM and disposition: The below labs and imaging reports were reviewed and summarized above.  Medication management as above.  This is a severe acute illness with threat to life  or bodily function.  The patient was admitted with AKI, sepsis from UTI.  She is still orthostatic requiring ongoing IV fluids, blood pressure this morning was 80/60.        DVT prophylaxis: Lovenox Code Status: Full code Family Communication: Husband by phone    Consultants:     Procedures:   US renal  Antimicrobials:   Vanc, Cefepime Flagyl x1  Ceftriaxone 10/22 >>    Subjective: Still dizzy.  Thighs are crampy, and weak, but overall leg weakness is strong.  She was dizzy with standing with PT.  No fever, confusion.  No chest pain.  No cough or sputum.  Objective: Vitals:   01/09/19 2340 01/10/19 0537 01/10/19 0702 01/10/19 0826  BP:  110/60 (!) 111/49 (!) 80/63  Pulse:  89 90 87  Resp:  18  18  Temp: 98.8 F (37.1 C) 99.2 F (37.3 C)  99 F (37.2 C)  TempSrc: Oral Oral  Oral  SpO2:  93%  95%  Weight:      Height:        Intake/Output Summary (Last 24 hours) at 01/10/2019 1648 Last data filed at 01/10/2019 1432 Gross per 24 hour  Intake 1453.9 ml  Output 350 ml  Net 1103.9 ml   Filed Weights   01/09/19 0821 01/09/19 1253  Weight: 74.8 kg 74.8 kg    Examination: General appearance:  adult female, alert and in no acute distress.  Appears tired HEENT: Anicteric, conjunctiva pink, lids and lashes normal. No nasal deformity, discharge, epistaxis.  Lips moist, dentition normal,  oropharynx moist, no oral lesions, hearing normal.   Skin: Warm and dry.  No jaundice.  No suspicious rashes or lesions. Cardiac: RRR, nl S1-S2, no murmurs appreciated.  Capillary refill is brisk.  JVP normal.  No LE edema.  Radia  pulses 2+ and symmetric. Respiratory: Normal respiratory rate and rhythm.  CTAB without rales or wheezes. Abdomen: Abdomen soft.  Mild right upper quadrant TTP without guarding.  No CVA angle tenderness. No ascites, distension, hepatosplenomegaly.   MSK: No deformities or effusions. Neuro: Awake and alert.  EOMI, sensation is intact on both sides of  the abdomen, and both legs, to light touch, cold, pinprick.  No paresthesias in the legs.  Her strength is 5/5 in bilateral lower extremities, with the only exception of hip flexion is symmetrically weak.  Speech fluent.    Psych: Sensorium intact and responding to questions, attention normal. Affect normal.  Judgment and insight appear normal.    Data Reviewed: I have personally reviewed following labs and imaging studies:  CBC: Recent Labs  Lab 01/09/19 0515 01/10/19 0548  WBC 23.2* 15.9*  NEUTROABS 19.4*  --   HGB 13.4 11.2*  HCT 40.2 33.3*  MCV 87.4 85.4  PLT 340 [id number]   Basic Metabolic Panel: Recent Labs  Lab 01/09/19 0515 01/10/19 0548  NA 132* 134*  K 4.0 3.4*  CL 96* 101  CO2 24 22  GLUCOSE 214* 101*  BUN 23 25*  CREATININE 1.87* 1.75*  CALCIUM 8.8* 8.2*   GFR: Estimated Creatinine Clearance: 29 mL/min (A) (by C-G formula based on SCr of 1.75 mg/dL (H)). Liver Function Tests: Recent Labs  Lab 01/09/19 0515  AST 25  ALT 26  ALKPHOS 107  BILITOT 1.0  PROT 6.8  ALBUMIN 3.0*   No results for input(s): LIPASE, AMYLASE in the last 168 hours. No results for input(s): AMMONIA in the last 168 hours. Coagulation Profile: No results for input(s): INR, PROTIME in the last 168 hours. Cardiac Enzymes: No results for input(s): CKTOTAL, CKMB, CKMBINDEX, TROPONINI in the last 168 hours. BNP (last 3 results) No results for input(s): PROBNP in the last 8760 hours. HbA1C: Recent Labs    01/09/19 2254  HGBA1C 8.9*   CBG: Recent Labs  Lab 01/09/19 2312 01/10/19 0834 01/10/19 1123 01/10/19 1556  GLUCAP 104* 150* 115* 201*   Lipid Profile: No results for input(s): CHOL, HDL, LDLCALC, TRIG, CHOLHDL, LDLDIRECT in the last 72 hours. Thyroid Function Tests: No results for input(s): TSH, T4TOTAL, FREET4, T3FREE, THYROIDAB in the last 72 hours. Anemia Panel: No results for input(s): VITAMINB12, FOLATE, FERRITIN, TIBC, IRON, RETICCTPCT in the last 72 hours. Urine  analysis:    Component Value Date/Time   COLORURINE AMBER (A) 01/09/2019 1828   APPEARANCEUR CLOUDY (A) 01/09/2019 1828   LABSPEC 1.020 01/09/2019 1828   PHURINE 5.0 01/09/2019 1828   GLUCOSEU 50 (A) 01/09/2019 1828   HGBUR SMALL (A) 01/09/2019 1828   BILIRUBINUR NEGATIVE 01/09/2019 1828   KETONESUR NEGATIVE 01/09/2019 1828   PROTEINUR 100 (A) 01/09/2019 1828   NITRITE NEGATIVE 01/09/2019 1828   LEUKOCYTESUR MODERATE (A) 01/09/2019 1828   Sepsis Labs: @LABRCNTIP (procalcitonin:4,lacticacidven:4)  ) Recent Results (from the past 240 hour(s))  Blood culture (routine x 2)     Status: None (Preliminary result)   Collection Time: 01/09/19  8:17 AM   Specimen: BLOOD RIGHT HAND  Result Value Ref Range Status   Specimen Description BLOOD RIGHT HAND  Final   Special Requests   Final    BOTTLES DRAWN  AEROBIC AND ANAEROBIC Blood Culture results may not be optimal due to an inadequate volume of blood received in culture bottles   Culture   Final    NO GROWTH < 24 HOURS Performed at [facility] [address], [place], [place] [address]    Report Status PENDING  Incomplete  Blood culture (routine x 2)     Status: None (Preliminary result)   Collection Time: 01/09/19 10:15 AM   Specimen: BLOOD RIGHT HAND  Result Value Ref Range Status   Specimen Description BLOOD RIGHT HAND  Final   Special Requests   Final    BOTTLES DRAWN AEROBIC ONLY Blood Culture adequate volume   Culture   Final    NO GROWTH < 24 HOURS Performed at [facility], [place] [address] [place], [place] [address]    Report Status PENDING  Incomplete  SARS Coronavirus 2 by RT PCR (hospital order, performed in [facility] hospital lab) Nasopharyngeal Nasopharyngeal Swab     Status: None   Collection Time: 01/09/19 12:58 PM   Specimen: Nasopharyngeal Swab  Result Value Ref Range Status   SARS Coronavirus 2 NEGATIVE NEGATIVE Final    Comment: (NOTE) If result is NEGATIVE SARS-CoV-2 target nucleic acids are  NOT DETECTED. The SARS-CoV-2 RNA is generally detectable in upper and lower  respiratory specimens during the acute phase of infection. The lowest  concentration of SARS-CoV-2 viral copies this assay can detect is 250  copies / mL. A negative result does not preclude SARS-CoV-2 infection  and should not be used as the sole basis for treatment or other  patient management decisions.  A negative result may occur with  improper specimen collection / handling, submission of specimen other  than nasopharyngeal swab, presence of viral mutation(s) within the  areas targeted by this assay, and inadequate number of viral copies  (<250 copies / mL). A negative result must be combined with clinical  observations, patient history, and epidemiological information. If result is POSITIVE SARS-CoV-2 target nucleic acids are DETECTED. The SARS-CoV-2 RNA is generally detectable in upper and lower  respiratory specimens dur ing the acute phase of infection.  Positive  results are indicative of active infection with SARS-CoV-2.  Clinical  correlation with patient history and other diagnostic information is  necessary to determine patient infection status.  Positive results do  not rule out bacterial infection or co-infection with other viruses. If result is PRESUMPTIVE POSTIVE SARS-CoV-2 nucleic acids MAY BE PRESENT.   A presumptive positive result was obtained on the submitted specimen  and confirmed on repeat testing.  While 2019 novel coronavirus  (SARS-CoV-2) nucleic acids may be present in the submitted sample  additional confirmatory testing may be necessary for epidemiological  and / or clinical management purposes  to differentiate between  SARS-CoV-2 and other Sarbecovirus currently known to infect humans.  If clinically indicated additional testing with an alternate test  methodology [phone]) is advised. The SARS-CoV-2 RNA is generally  detectable in upper and lower respiratory sp ecimens  during the acute  phase of infection. The expected result is Negative. Fact Sheet for Patients:  StrictlyIdeas.no Fact Sheet for Healthcare Providers: BankingDealers.co.za This test is not yet approved or cleared by the [place] FDA and has been authorized for detection and/or diagnosis of SARS-CoV-2 by FDA under an Emergency Use Authorization (EUA).  This EUA will remain in effect (meaning this test can be used) for the duration of the COVID-19 declaration under Section 564(b)(1) of the  Act, 21 U.S.C. section 360bbb-3(b)(1), unless the authorization is terminated or revoked sooner. Performed at [facility], [place] [address], [place], [place] [address]          Radiology Studies: Dg Lumbar Spine Complete  Result Date: 01/09/2019 CLINICAL DATA:  Low back pain for 2 days EXAM: LUMBAR SPINE - COMPLETE 4+ VIEW COMPARISON:  07/15/2018 FINDINGS: There is no evidence of acute lumbar spine fracture. Remote L2 compression fracture with superior endplate depression. Diffuse degenerative disc narrowing with endplate spurring. Asymmetric height loss and leftward translation at L3-4 causes levoscoliosis. Lumbar degenerative facet spurring greatest inferiorly and on the left. Atherosclerotic calcification. IMPRESSION: 1. No acute finding or change from April 2020. 2. Remote L2 compression fracture. 3. Advanced degenerative disease with levoscoliosis. Electronically Signed   By: Monte Fantasia M.D.   On: 01/09/2019 05:33   Ct Cervical Spine Wo Contrast  Result Date: 01/09/2019 CLINICAL DATA:  C-spine trauma, posterior neck pain that radiates to both shoulders. EXAM: CT CERVICAL SPINE WITHOUT CONTRAST TECHNIQUE: Multidetector CT imaging of the cervical spine was performed without intravenous contrast. Multiplanar CT image reconstructions were also generated. COMPARISON:  Cervical spine evaluation from 12/20/2015. FINDINGS: Alignment: Normal.  Skull base and vertebrae: No acute fracture. No primary bone lesion or focal pathologic process. Soft tissues and spinal canal: No prevertebral fluid or swelling. No visible canal hematoma. Disc levels: Degenerative changes present in the spine worse at C1-2 and with facet disease on the left at C4-5. Milder facet degenerative changes are noted in the lower cervical spine. Disc space narrowing is greatest at C6-C7. Upper chest: Choose 1 Other: None IMPRESSION: Degenerative changes in the cervical spine. No fracture or traumatic malalignment. Electronically Signed   By: Zetta Bills M.D.   On: 01/09/2019 10:22   Mr Lumbar Spine Wo Contrast  Result Date: 01/09/2019 CLINICAL DATA:  Radiculopathy. Low back pain. The patient fell yesterday. Fever. Elevated white blood count. Prior lumbar surgery. And lumbar MRI dated 07/05/2018 EXAM: MRI LUMBAR SPINE WITHOUT CONTRAST TECHNIQUE: Multiplanar, multisequence MR imaging of the lumbar spine was performed. No intravenous contrast was administered. COMPARISON:  Lumbar radiographs dated 01/09/2019 FINDINGS: Segmentation:  Standard. Alignment: Slight chronic retrolisthesis of L2 on L3 and of L3 on L4, stable. Vertebrae: No acute abnormalities. Old healed compression deformity of L2 with Schmorl's nodes in the inferior and superior endplates. Conus medullaris and cauda equina: Conus extends to the L1 level. Conus and cauda equina appear normal. Paraspinal and other soft tissues: There is new slight soft tissue stranding around the right kidney with slight prominence of the right renal pelvis. The ureter does not appear dilated. There is also a new small reactive node under right renal artery. The possibility of right urinary tract infection should be considered. Disc levels: L1-2: Disc desiccation. No disc bulging or protrusion. No change since the prior study. L2-3: Small disc bulge with accompanying osteophytes extending into the right neural foramen with moderate right  foraminal stenosis. Hypertrophy of the right facet joint and ligamentum flavum impinges upon the right lateral recess, essentially unchanged. L3-4: Chronic retrolisthesis and disc space narrowing with a small broad-based protrusion of the disc asymmetric into the right neural foramen and right lateral recess. Interval right laminotomy with decompression of the thecal sac. Chronic facet arthritis. Moderately severe right foraminal stenosis. The right L3 nerve appears to exit without impingement. L4-5: No significant disc bulging or protrusion. Severe left and moderate right facet arthritis. Previous right laminectomy with decompression of the thecal  sac. No focal neural impingement. L5-S1: Severe bilateral facet arthritis, unchanged. No disc bulging or protrusion. IMPRESSION: 1. New soft tissue stranding around the right kidney with slight prominence of the right renal pelvis. The possibility of right urinary tract infection should be considered. 2. Interval right laminotomy at L3-4 with decompression of the thecal sac. 3. Moderately severe right foraminal stenosis at L3-4. 4. Severe left facet arthritis at L4-5 and L5-S1. 5. No evidence of discitis, osteomyelitis, or other infection involving the lumbar spine. 6. No acute fractures. Electronically Signed   By: Lorriane Shire M.D.   On: 01/09/2019 17:58   US Renal  Result Date: 01/09/2019 CLINICAL DATA:  History of sepsis, elevated creatinine/acute renal failure. EXAM: RENAL / URINARY TRACT ULTRASOUND COMPLETE COMPARISON:  None FINDINGS: Right Kidney: Renal measurements: 12.5 x 5.5 x 5.0 (volume = 180) cm = volume: 180 mL. Mild cortical scarring, no hydronephrosis. Mild parenchymal thinning. Left Kidney: Renal measurements: 10.3 x 4.8 x 4.5 (volume = 120) cm = volume: 120 mL. Decreased corticomedullary differentiation, cortical thinning and cortical scarring similar to the contralateral kidney, no signs of hydronephrosis. No visible lesion or calculus. Bladder:  Appears normal for degree of bladder distention. Other: Echogenic hepatic parenchyma suggests steatosis. IMPRESSION: 1. Renal cortical thinning and parenchymal scarring. 2. Mild hepatic steatosis. Electronically Signed   By: Zetta Bills M.D.   On: 01/09/2019 15:51   Dg Chest Portable 1 View  Result Date: 01/09/2019 CLINICAL DATA:  [place] yesterday.  Shortness of breath and fever. EXAM: PORTABLE CHEST 1 VIEW COMPARISON:  08/08/2005 FINDINGS: The cardiac silhouette, mediastinal and hilar contours are normal. There is mild eventration right hemidiaphragm with overlying vascular crowding and atelectasis. Could not exclude a small right effusion. The left lung is clear. The bony thorax appears intact.  No definite rib fractures. IMPRESSION: Mild eventration of the right hemidiaphragm with overlying vascular crowding and streaky atelectasis. Possible small right pleural effusion. Grossly intact bony thorax. Electronically Signed   By: Marijo Sanes M.D.   On: 01/09/2019 08:27        Scheduled Meds:  atorvastatin  40 mg Oral q1800   enoxaparin (LOVENOX) injection  30 mg Subcutaneous Q24H   insulin aspart  0-5 Units Subcutaneous QHS   insulin aspart  0-9 Units Subcutaneous TID WC   Continuous Infusions:  0.9 % NaCl with KCl 20 mEq / L 100 mL/hr at 01/10/19 1334   cefTRIAXone (ROCEPHIN)  IV 2 g (01/10/19 0303)     LOS: 1 day    Time spent: 35 minutes    Edwin Dada, MD [facility] 01/10/2019, 4:48 PM     Please page through [place]:  [email or web address] Password [email or web address] If 7PM-7AM, please contact night-coverage

## 2019-01-10 NOTE — Plan of Care (Signed)
  Problem: Education: Goal: Knowledge of General Education information will improve Description Including pain rating scale, medication(s)/side effects and non-pharmacologic comfort measures Outcome: Progressing   
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
## 2019-01-10 NOTE — Progress Notes (Signed)
New Admission Note: ? Arrival Method: Stretcher Mental Orientation: Alert and Oriented x 4 Telemetry: Box 5M10 Assessment: Completed Skin: Refer to flowsheet IV: Right hand Pain: 4/10 Tubes: None Safety Measures: Safety Fall Prevention Plan discussed with patient. Admission: Completed 5 [place] Orientation: Patient has been orientated to the room, unit and the staff. Family: None at the bedside Orders have been reviewed and are being implemented. Will continue to monitor the patient. Call light has been placed within reach and bed alarm has been activated.  ? Milagros Loll, RN  Phone Number: [phone]

## 2019-01-11 DIAGNOSIS — I10 Essential (primary) hypertension: Secondary | ICD-10-CM | POA: Diagnosis not present

## 2019-01-11 DIAGNOSIS — E871 Hypo-osmolality and hyponatremia: Secondary | ICD-10-CM | POA: Diagnosis not present

## 2019-01-11 DIAGNOSIS — A419 Sepsis, unspecified organism: Secondary | ICD-10-CM | POA: Diagnosis not present

## 2019-01-11 DIAGNOSIS — N179 Acute kidney failure, unspecified: Secondary | ICD-10-CM | POA: Diagnosis not present

## 2019-01-11 LAB — COMPREHENSIVE METABOLIC PANEL
ALT: 45 U/L — ABNORMAL HIGH (ref 0–44)
AST: 59 U/L — ABNORMAL HIGH (ref 15–41)
Albumin: 2 g/dL — ABNORMAL LOW (ref 3.5–5.0)
Alkaline Phosphatase: 111 U/L (ref 38–126)
Anion gap: 10 (ref 5–15)
BUN: 19 mg/dL (ref 8–23)
CO2: 21 mmol/L — ABNORMAL LOW (ref 22–32)
Calcium: 7.8 mg/dL — ABNORMAL LOW (ref 8.9–10.3)
Chloride: 106 mmol/L (ref 98–111)
Creatinine, Ser: 1.3 mg/dL — ABNORMAL HIGH (ref 0.44–1.00)
GFR calc Af Amer: 48 mL/min — ABNORMAL LOW (ref >60)
GFR calc non Af Amer: 42 mL/min — ABNORMAL LOW (ref >60)
Glucose, Bld: 123 mg/dL — ABNORMAL HIGH (ref 70–99)
Potassium: 3.5 mmol/L (ref 3.5–5.1)
Sodium: 137 mmol/L (ref 135–145)
Total Bilirubin: 0.3 mg/dL (ref 0.3–1.2)
Total Protein: 5.3 g/dL — ABNORMAL LOW (ref 6.5–8.1)

## 2019-01-11 LAB — CBC
HCT: 32.6 % — ABNORMAL LOW (ref 36.0–46.0)
Hemoglobin: 11.2 g/dL — ABNORMAL LOW (ref 12.0–15.0)
MCH: 29.4 pg (ref 26.0–34.0)
MCHC: 34.4 g/dL (ref 30.0–36.0)
MCV: 85.6 fL (ref 80.0–100.0)
Platelets: 294 10*3/uL (ref 150–400)
RBC: 3.81 MIL/uL — ABNORMAL LOW (ref 3.87–5.11)
RDW: 14.1 % (ref 11.5–15.5)
WBC: 11.9 10*3/uL — ABNORMAL HIGH (ref 4.0–10.5)
nRBC: 0 % (ref 0.0–0.2)

## 2019-01-11 LAB — GLUCOSE, CAPILLARY
Glucose-Capillary: 112 mg/dL — ABNORMAL HIGH (ref 70–99)
Glucose-Capillary: 185 mg/dL — ABNORMAL HIGH (ref 70–99)
Glucose-Capillary: 116 mg/dL — ABNORMAL HIGH (ref 70–99)
Glucose-Capillary: 162 mg/dL — ABNORMAL HIGH (ref 70–99)

## 2019-01-11 MED ORDER — MUSCLE RUB 10-15 % EX CREA
TOPICAL_CREAM | CUTANEOUS | Status: DC | PRN
  Filled 2019-01-11: qty 85

## 2019-01-11 MED ORDER — ENOXAPARIN SODIUM 40 MG/0.4ML ~~LOC~~ SOLN
40.0000 mg | SUBCUTANEOUS | Status: DC
  Administered 2019-01-11 – 2019-01-13 (×3): 40 mg via SUBCUTANEOUS
  Filled 2019-01-11 (×3): qty 0.4

## 2019-01-11 MED ORDER — LOPERAMIDE HCL 2 MG PO CAPS: 2.0000 mg | ORAL_CAPSULE | ORAL | Status: DC | PRN

## 2019-01-11 MED ORDER — SACCHAROMYCES BOULARDII 250 MG PO CAPS
250.0000 mg | ORAL_CAPSULE | Freq: Two times a day (BID) | ORAL | Status: DC
  Administered 2019-01-11 – 2019-01-14 (×7): 250 mg via ORAL
  Filled 2019-01-11 (×7): qty 1

## 2019-01-11 NOTE — Progress Notes (Signed)
Patient ambulated in the room and little bit in the hallway approximately 100 feet.

## 2019-01-11 NOTE — Progress Notes (Signed)
Physical Therapy Treatment Patient Details Name: Anna [name] MRN: [id number] DOB: 1950/07/02 Today's Date: 01/11/2019    History of Present Illness Pt is a 68 y.o. female admitted 01/09/19 after fall at home; worked up for hypotension, sepsis from UTI. PMH includes lumbar stenosis s/p sx, C-spine DJD fibromyalgia, HTN, DM, CKD, psoriasis, RLS.   PT Comments    Pt progressing well with mobility. Ambulatory with RW with min guard to supervision-level. Intermittent c/o dizziness upon sitting and standing which resided with rest (see BP values below). Pt would benefit from use of RW for added stability. Husband present at end of session and supportive. Increased time discussing fall risk reduction strategies. Pt hopeful for d/c home soon. Do not feel she will require follow-up with HHPT services, pt in agreement.  Orthostatic BPs  Supine 155/72  Sitting 167/68  Standing 167/86  Walking ~65ft 155/94  Post-mobility, return to sit 137/118     Follow Up Recommendations  No PT follow up;Supervision for mobility/OOB     Equipment Recommendations  Rolling walker with 5" wheels    Recommendations for Other Services       Precautions / Restrictions Precautions Precautions: Fall Restrictions Weight Bearing Restrictions: No    Mobility  Bed Mobility Overal bed mobility: Independent             General bed mobility comments: Initial c/o dizziness upon sitting which subsided with prolonged sitting, BP stable  Transfers Overall transfer level: Needs assistance Equipment used: Rolling walker (2 wheeled) Transfers: Sit to/from Stand Sit to Stand: Supervision         General transfer comment: C/o initial dizziness upon standing which subsided with standing rest and pumping arms, BP stable. Able to stand from EOB and chair with no armrests to RW, supervision for safety  Ambulation/Gait Ambulation/Gait assistance: Min guard Gait Distance (Feet): 40 Feet Assistive device:  Rolling walker (2 wheeled) Gait Pattern/deviations: Step-through pattern;Decreased stride length Gait velocity: Decreased Gait velocity interpretation: 1.31 - 2.62 ft/sec, indicative of limited community ambulator General Gait Details: Amb in room with RW and min guard due to intermittent dizziness, 1x seated rest break; BP stable. Cues for mechanics with RW   Stairs             Wheelchair Mobility    Modified Rankin (Stroke Patients Only)       Balance Overall balance assessment: Needs assistance Sitting-balance support: Feet supported Sitting balance-Leahy Scale: Good Sitting balance - Comments: Indep to cross legs sitting in chair     Standing balance-Leahy Scale: Fair Standing balance comment: Can static stand to pump arms without UE support                            Cognition Arousal/Alertness: Awake/alert Behavior During Therapy: WFL for tasks assessed/performed Overall Cognitive Status: Within Functional Limits for tasks assessed                                        Exercises      General Comments General comments (skin integrity, edema, etc.): Husband present at end of session, supportive. Discussed strategies for orthostatic hypotension/feeling dizzy (i.e. pumping arms, fall risk reduction)      Pertinent Vitals/Pain Pain Assessment: Faces Faces Pain Scale: Hurts a little bit Pain Location: R hip Pain Descriptors / Indicators: Constant;Sore Pain Intervention(s): Monitored during session  Home Living                      Prior Function            PT Goals (current goals can now be found in the care plan section) Progress towards PT goals: Progressing toward goals    Frequency    Min 3X/week      PT Plan Frequency needs to be updated;Discharge plan needs to be updated    Co-evaluation              AM-PAC PT "6 Clicks" Mobility   Outcome Measure  Help needed turning from your back to  your side while in a flat bed without using bedrails?: None Help needed moving from lying on your back to sitting on the side of a flat bed without using bedrails?: None Help needed moving to and from a bed to a chair (including a wheelchair)?: A Little Help needed standing up from a chair using your arms (e.g., wheelchair or bedside chair)?: A Little Help needed to walk in hospital room?: A Little Help needed climbing 3-5 steps with a railing? : A Little 6 Click Score: 20    End of Session Equipment Utilized During Treatment: Gait belt Activity Tolerance: Patient tolerated treatment well Patient left: in chair;with call bell/phone within reach;with family/visitor present Nurse Communication: Mobility status PT Visit Diagnosis: Other abnormalities of gait and mobility (R26.89)     Time: [id number] PT Time Calculation (min) (ACUTE ONLY): 32 min  Charges:  $Therapeutic Activity: 23-37 mins                    Mabeline Caras, PT, DPT Acute Rehabilitation Services  Pager [phone] Office [place] 01/11/2019, 3:29 PM

## 2019-01-11 NOTE — Progress Notes (Addendum)
PROGRESS NOTE    Anna [name]  [id number] DOB: 01-02-1951 DOA: 01/09/2019 PCP: Leanna Battles, MD      Brief Narrative:  Anna [name] is a 68 y.o. F with fibromyalgia, HTN DM, chronic low back pain, restless leg syndrome and previous back surgery who presented with fall.    2 days PTA, patient fell, landing on buttocks.  Since then, has had numbness tingling and inability to walk.    In ER, patient febrile 102.27F, tachycardic, WBC 23K, Cr 1.87.  Started on broad spectrum antibiotics.    Assessment & Plan:  Sepsis from UTI  Presented with fever, tachycardia, and renal failure.  CXR nonspecific.  UA showed pyuria, bacteriuria.  MR lumbar spine showed renal stranding.  US shows no evidence of stone or hydro.  No further fever, BP normal.  Lactate repeated yesterday normal.   Culture still pending. -Continue ceftriaxone -Follow urine culture    AKI Non-anion gap metabolic acidosis Mild acidosis, likely from NaCL.  No previous history of chronic kidney disease.  Baseline creatinine in our system several years ago, less than 1.  Renal US shows cortical thinning but no hydro or overt medicorenal disease.  Today Cr down to 1.3.  FenA consistent with pre-renal injury.  0.6%.  Albuminuria noted. -Repeat creatinine tomorrow -Repeat urine protein and Cr with PCP in 1 month -Avoid hypotension, nephrotoxins  Back pain Fall Numbness Lumbar x-ray showed only old compression fx.  CT C-spine showed degenerative changes only.  MRI lumbar spine showed no infection, only some L3-4 foraminal RIGHT stenosis and LEFT facet arthritis at L4-S1 PT eval felt no leg weakness, nor do I on exam, despite patient emphasis that she is weak -Check CK -Repeat orthostatics today, continue IV fluids if still orthostatic, otherwise push oral fluids  Hyponatremia Hypovolemic, now resolved with fluids  Diabetes A1c 8.9% Glucoses well controlled -Continue SSI corrections -Continue aspirin,  atorvastatin -Hold metformin   Hypokalemia Mild, will defer supplement for now given AKI  Hypertension BP normal -Hold amlodipine, until hemodyanmica clearer    Transaminitis Probably ischemic, mild. -Repeat LFTs tomorrow        MDM and disposition: The below labs and imaging reports reviewed and summarized above.  Medication management as above.   The patient was admitted with AKI, sepsis from UTI.    Ongoing inpatient services are reasonable and necessary given the severity of presentation (renal failure, sepsis, dehydration), and the high risk of debility, death, readmission if she were to be discharged too early.      DVT prophylaxis: Lovenox Code Status: Full code Family Communication: Husband at the bedside    Consultants:     Procedures:   US renal  Antimicrobials:   Vanc, Cefepime Flagyl x1  Ceftriaxone 10/22 >>    Subjective: Her thighs are still in pain, but her dizziness is resolved.  No fever overnight.  No new confusion, cough, sputum, vomiting, back pain, leg weakness, paresthesias.       Objective: Vitals:   01/10/19 1757 01/10/19 2032 01/11/19 0519 01/11/19 0824  BP: 137/84 (!) 153/73 (!) 145/60 (!) 141/64  Pulse: 90 86 75 73  Resp: 18 18 18 18   Temp: 99.4 F (37.4 C) 99.1 F (37.3 C) 97.8 F (36.6 C) 98.4 F (36.9 C)  TempSrc: Oral Oral Oral Oral  SpO2: 93% 94% 93% 96%  Weight:      Height:        Intake/Output Summary (Last 24 hours) at 01/11/2019 1354 Last data filed at  01/11/2019 1245 Gross per 24 hour  Intake 2737.75 ml  Output 1150 ml  Net 1587.75 ml   Filed Weights   01/09/19 0821 01/09/19 1253  Weight: 74.8 kg 74.8 kg    Examination: General appearance: Adult female, lying in bed, no acute distress, interactive. HEENT: Anicteric, conjunctival pink, lids and lashes normal.  No nasal deformity, discharge, or epistaxis.  Lips moist, dentition normal, oropharynx moist, no oral lesions, hearing normal. Skin:  Warm and dry, no jaundice, no suspicious rashes or lesions. Cardiac: Regular rate and rhythm, no murmurs appreciated, JVP normal, no lower extremity edema Respiratory: Normal respiratory rate and rhythm, lungs clear without rales or wheezes.  Good air entry. Abdomen: Abdomen soft, no tenderness palpation or guarding, no distention. MSK: No muscle bulk and tone. Neuro: Awake alert, extraocular movements intact, moves all extremities with normal strength and coordination, speech fluent, strength 5/5 bilateral lower extremities.    Psych: Sodium intact responding to questions, attention normal, affect normal, judgment insight appear normal.    Data Reviewed: I have personally reviewed following labs and imaging studies:  CBC: Recent Labs  Lab 01/09/19 0515 01/10/19 0548 01/11/19 0352  WBC 23.2* 15.9* 11.9*  NEUTROABS 19.4*  --   --   HGB 13.4 11.2* 11.2*  HCT 40.2 33.3* 32.6*  MCV 87.4 85.4 85.6  PLT 340 303 [id number]   Basic Metabolic Panel: Recent Labs  Lab 01/09/19 0515 01/10/19 0548 01/11/19 0352  NA 132* 134* 137  K 4.0 3.4* 3.5  CL 96* 101 106  CO2 24 22 21*  GLUCOSE 214* 101* 123*  BUN 23 25* 19  CREATININE 1.87* 1.75* 1.30*  CALCIUM 8.8* 8.2* 7.8*   GFR: Estimated Creatinine Clearance: 39 mL/min (A) (by C-G formula based on SCr of 1.3 mg/dL (H)). Liver Function Tests: Recent Labs  Lab 01/09/19 0515 01/11/19 0352  AST 25 59*  ALT 26 45*  ALKPHOS 107 111  BILITOT 1.0 0.3  PROT 6.8 5.3*  ALBUMIN 3.0* 2.0*   No results for input(s): LIPASE, AMYLASE in the last 168 hours. No results for input(s): AMMONIA in the last 168 hours. Coagulation Profile: No results for input(s): INR, PROTIME in the last 168 hours. Cardiac Enzymes: No results for input(s): CKTOTAL, CKMB, CKMBINDEX, TROPONINI in the last 168 hours. BNP (last 3 results) No results for input(s): PROBNP in the last 8760 hours. HbA1C: Recent Labs    01/09/19 2254  HGBA1C 8.9*   CBG: Recent Labs  Lab  01/10/19 1123 01/10/19 1556 01/10/19 2033 01/11/19 0702 01/11/19 1124  GLUCAP 115* 201* 124* 112* 185*   Lipid Profile: No results for input(s): CHOL, HDL, LDLCALC, TRIG, CHOLHDL, LDLDIRECT in the last 72 hours. Thyroid Function Tests: No results for input(s): TSH, T4TOTAL, FREET4, T3FREE, THYROIDAB in the last 72 hours. Anemia Panel: No results for input(s): VITAMINB12, FOLATE, FERRITIN, TIBC, IRON, RETICCTPCT in the last 72 hours. Urine analysis:    Component Value Date/Time   COLORURINE AMBER (A) 01/09/2019 1828   APPEARANCEUR CLOUDY (A) 01/09/2019 1828   LABSPEC 1.020 01/09/2019 1828   PHURINE 5.0 01/09/2019 1828   GLUCOSEU 50 (A) 01/09/2019 1828   HGBUR SMALL (A) 01/09/2019 1828   BILIRUBINUR NEGATIVE 01/09/2019 1828   KETONESUR NEGATIVE 01/09/2019 1828   PROTEINUR 100 (A) 01/09/2019 1828   NITRITE NEGATIVE 01/09/2019 1828   LEUKOCYTESUR MODERATE (A) 01/09/2019 1828   Sepsis Labs: @LABRCNTIP (procalcitonin:4,lacticacidven:4)  ) Recent Results (from the past 240 hour(s))  Blood culture (routine x 2)     Status:  None (Preliminary result)   Collection Time: 01/09/19  8:17 AM   Specimen: BLOOD RIGHT HAND  Result Value Ref Range Status   Specimen Description BLOOD RIGHT HAND  Final   Special Requests   Final    BOTTLES DRAWN AEROBIC AND ANAEROBIC Blood Culture results may not be optimal due to an inadequate volume of blood received in culture bottles   Culture   Final    NO GROWTH 2 DAYS Performed at [facility], [place] [address] [place], [place] [address]    Report Status PENDING  Incomplete  Blood culture (routine x 2)     Status: None (Preliminary result)   Collection Time: 01/09/19 10:15 AM   Specimen: BLOOD RIGHT HAND  Result Value Ref Range Status   Specimen Description BLOOD RIGHT HAND  Final   Special Requests   Final    BOTTLES DRAWN AEROBIC ONLY Blood Culture adequate volume   Culture   Final    NO GROWTH 2 DAYS Performed at [facility], [place] [address], [place], [place] [address]    Report Status PENDING  Incomplete  SARS Coronavirus 2 by RT PCR (hospital order, performed in [facility] hospital lab) Nasopharyngeal Nasopharyngeal Swab     Status: None   Collection Time: 01/09/19 12:58 PM   Specimen: Nasopharyngeal Swab  Result Value Ref Range Status   SARS Coronavirus 2 NEGATIVE NEGATIVE Final    Comment: (NOTE) If result is NEGATIVE SARS-CoV-2 target nucleic acids are NOT DETECTED. The SARS-CoV-2 RNA is generally detectable in upper and lower  respiratory specimens during the acute phase of infection. The lowest  concentration of SARS-CoV-2 viral copies this assay can detect is 250  copies / mL. A negative result does not preclude SARS-CoV-2 infection  and should not be used as the sole basis for treatment or other  patient management decisions.  A negative result may occur with  improper specimen collection / handling, submission of specimen other  than nasopharyngeal swab, presence of viral mutation(s) within the  areas targeted by this assay, and inadequate number of viral copies  (<250 copies / mL). A negative result must be combined with clinical  observations, patient history, and epidemiological information. If result is POSITIVE SARS-CoV-2 target nucleic acids are DETECTED. The SARS-CoV-2 RNA is generally detectable in upper and lower  respiratory specimens dur ing the acute phase of infection.  Positive  results are indicative of active infection with SARS-CoV-2.  Clinical  correlation with patient history and other diagnostic information is  necessary to determine patient infection status.  Positive results do  not rule out bacterial infection or co-infection with other viruses. If result is PRESUMPTIVE POSTIVE SARS-CoV-2 nucleic acids MAY BE PRESENT.   A presumptive positive result was obtained on the submitted specimen  and confirmed on repeat testing.  While 2019 novel coronavirus  (SARS-CoV-2)  nucleic acids may be present in the submitted sample  additional confirmatory testing may be necessary for epidemiological  and / or clinical management purposes  to differentiate between  SARS-CoV-2 and other Sarbecovirus currently known to infect humans.  If clinically indicated additional testing with an alternate test  methodology [phone]) is advised. The SARS-CoV-2 RNA is generally  detectable in upper and lower respiratory sp ecimens during the acute  phase of infection. The expected result is Negative. Fact Sheet for Patients:  StrictlyIdeas.no Fact Sheet for Healthcare Providers: BankingDealers.co.za This test is not yet approved or cleared by the [place] FDA and  has been authorized for detection and/or diagnosis of SARS-CoV-2 by FDA under an Emergency Use Authorization (EUA).  This EUA will remain in effect (meaning this test can be used) for the duration of the COVID-19 declaration under Section 564(b)(1) of the Act, 21 U.S.C. section 360bbb-3(b)(1), unless the authorization is terminated or revoked sooner. Performed at [facility], [place] [address], [place], [place] [address]   Urine Culture     Status: Abnormal (Preliminary result)   Collection Time: 01/10/19  1:42 PM   Specimen: Urine, Clean Catch  Result Value Ref Range Status   Specimen Description URINE, CLEAN CATCH  Final   Special Requests NONE  Final   Culture (A)  Final    >=100,000 COLONIES/mL ENTEROCOCCUS FAECALIS SUSCEPTIBILITIES TO FOLLOW Performed at [facility], [place] [address], [place], [place] [address]    Report Status PENDING  Incomplete         Radiology Studies: Mr Lumbar Spine Wo Contrast  Result Date: 01/09/2019 CLINICAL DATA:  Radiculopathy. Low back pain. The patient fell yesterday. Fever. Elevated white blood count. Prior lumbar surgery. And lumbar MRI dated 07/05/2018 EXAM: MRI LUMBAR SPINE WITHOUT CONTRAST TECHNIQUE:  Multiplanar, multisequence MR imaging of the lumbar spine was performed. No intravenous contrast was administered. COMPARISON:  Lumbar radiographs dated 01/09/2019 FINDINGS: Segmentation:  Standard. Alignment: Slight chronic retrolisthesis of L2 on L3 and of L3 on L4, stable. Vertebrae: No acute abnormalities. Old healed compression deformity of L2 with Schmorl's nodes in the inferior and superior endplates. Conus medullaris and cauda equina: Conus extends to the L1 level. Conus and cauda equina appear normal. Paraspinal and other soft tissues: There is new slight soft tissue stranding around the right kidney with slight prominence of the right renal pelvis. The ureter does not appear dilated. There is also a new small reactive node under right renal artery. The possibility of right urinary tract infection should be considered. Disc levels: L1-2: Disc desiccation. No disc bulging or protrusion. No change since the prior study. L2-3: Small disc bulge with accompanying osteophytes extending into the right neural foramen with moderate right foraminal stenosis. Hypertrophy of the right facet joint and ligamentum flavum impinges upon the right lateral recess, essentially unchanged. L3-4: Chronic retrolisthesis and disc space narrowing with a small broad-based protrusion of the disc asymmetric into the right neural foramen and right lateral recess. Interval right laminotomy with decompression of the thecal sac. Chronic facet arthritis. Moderately severe right foraminal stenosis. The right L3 nerve appears to exit without impingement. L4-5: No significant disc bulging or protrusion. Severe left and moderate right facet arthritis. Previous right laminectomy with decompression of the thecal sac. No focal neural impingement. L5-S1: Severe bilateral facet arthritis, unchanged. No disc bulging or protrusion. IMPRESSION: 1. New soft tissue stranding around the right kidney with slight prominence of the right renal pelvis. The  possibility of right urinary tract infection should be considered. 2. Interval right laminotomy at L3-4 with decompression of the thecal sac. 3. Moderately severe right foraminal stenosis at L3-4. 4. Severe left facet arthritis at L4-5 and L5-S1. 5. No evidence of discitis, osteomyelitis, or other infection involving the lumbar spine. 6. No acute fractures. Electronically Signed   By: Lorriane Shire M.D.   On: 01/09/2019 17:58   US Renal  Result Date: 01/09/2019 CLINICAL DATA:  History of sepsis, elevated creatinine/acute renal failure. EXAM: RENAL / URINARY TRACT ULTRASOUND COMPLETE COMPARISON:  None FINDINGS: Right Kidney: Renal measurements: 12.5 x 5.5 x 5.0 (volume =  180) cm = volume: 180 mL. Mild cortical scarring, no hydronephrosis. Mild parenchymal thinning. Left Kidney: Renal measurements: 10.3 x 4.8 x 4.5 (volume = 120) cm = volume: 120 mL. Decreased corticomedullary differentiation, cortical thinning and cortical scarring similar to the contralateral kidney, no signs of hydronephrosis. No visible lesion or calculus. Bladder: Appears normal for degree of bladder distention. Other: Echogenic hepatic parenchyma suggests steatosis. IMPRESSION: 1. Renal cortical thinning and parenchymal scarring. 2. Mild hepatic steatosis. Electronically Signed   By: Zetta Bills M.D.   On: 01/09/2019 15:51        Scheduled Meds:  atorvastatin  40 mg Oral q1800   enoxaparin (LOVENOX) injection  40 mg Subcutaneous Q24H   insulin aspart  0-5 Units Subcutaneous QHS   insulin aspart  0-9 Units Subcutaneous TID WC   saccharomyces boulardii  250 mg Oral BID   Continuous Infusions:  cefTRIAXone (ROCEPHIN)  IV 2 g (01/11/19 0106)     LOS: 2 days    Time spent: 25 minutes    Edwin Dada, MD [facility] 01/11/2019, 1:54 PM     Please page through [place]:  [email or web address] Password [email or web address] If 7PM-7AM, please contact night-coverage

## 2019-01-12 DIAGNOSIS — A419 Sepsis, unspecified organism: Secondary | ICD-10-CM | POA: Diagnosis not present

## 2019-01-12 DIAGNOSIS — N390 Urinary tract infection, site not specified: Secondary | ICD-10-CM | POA: Diagnosis not present

## 2019-01-12 LAB — CBC
HCT: 31.5 % — ABNORMAL LOW (ref 36.0–46.0)
Hemoglobin: 10.8 g/dL — ABNORMAL LOW (ref 12.0–15.0)
MCH: 29.1 pg (ref 26.0–34.0)
MCHC: 34.3 g/dL (ref 30.0–36.0)
MCV: 84.9 fL (ref 80.0–100.0)
Platelets: 363 10*3/uL (ref 150–400)
RBC: 3.71 MIL/uL — ABNORMAL LOW (ref 3.87–5.11)
RDW: 14.2 % (ref 11.5–15.5)
WBC: 11.4 10*3/uL — ABNORMAL HIGH (ref 4.0–10.5)
nRBC: 0 % (ref 0.0–0.2)

## 2019-01-12 LAB — URINE CULTURE: Culture: >=100000 — AB

## 2019-01-12 LAB — COMPREHENSIVE METABOLIC PANEL
ALT: 54 U/L — ABNORMAL HIGH (ref 0–44)
AST: 54 U/L — ABNORMAL HIGH (ref 15–41)
Albumin: 2 g/dL — ABNORMAL LOW (ref 3.5–5.0)
Alkaline Phosphatase: 124 U/L (ref 38–126)
Anion gap: 11 (ref 5–15)
BUN: 13 mg/dL (ref 8–23)
CO2: 23 mmol/L (ref 22–32)
Calcium: 7.8 mg/dL — ABNORMAL LOW (ref 8.9–10.3)
Chloride: 103 mmol/L (ref 98–111)
Creatinine, Ser: 1.18 mg/dL — ABNORMAL HIGH (ref 0.44–1.00)
GFR calc Af Amer: 54 mL/min — ABNORMAL LOW (ref >60)
GFR calc non Af Amer: 47 mL/min — ABNORMAL LOW (ref >60)
Glucose, Bld: 131 mg/dL — ABNORMAL HIGH (ref 70–99)
Potassium: 3.2 mmol/L — ABNORMAL LOW (ref 3.5–5.1)
Sodium: 137 mmol/L (ref 135–145)
Total Bilirubin: 0.5 mg/dL (ref 0.3–1.2)
Total Protein: 5.5 g/dL — ABNORMAL LOW (ref 6.5–8.1)

## 2019-01-12 LAB — GLUCOSE, CAPILLARY
Glucose-Capillary: 125 mg/dL — ABNORMAL HIGH (ref 70–99)
Glucose-Capillary: 271 mg/dL — ABNORMAL HIGH (ref 70–99)
Glucose-Capillary: 159 mg/dL — ABNORMAL HIGH (ref 70–99)
Glucose-Capillary: 163 mg/dL — ABNORMAL HIGH (ref 70–99)

## 2019-01-12 LAB — CK: Total CK: 18 U/L — ABNORMAL LOW (ref 38–234)

## 2019-01-12 MED ORDER — GABAPENTIN 100 MG PO CAPS: 100.0000 mg | ORAL_CAPSULE | Freq: Once | ORAL | Status: DC

## 2019-01-12 MED ORDER — SODIUM CHLORIDE 0.9 % IV SOLN
1.0000 g | Freq: Four times a day (QID) | INTRAVENOUS | Status: DC
  Administered 2019-01-12 – 2019-01-14 (×9): 1 g via INTRAVENOUS
  Filled 2019-01-12 (×15): qty 1000

## 2019-01-12 MED ORDER — AMLODIPINE BESYLATE 5 MG PO TABS
5.0000 mg | ORAL_TABLET | Freq: Every day | ORAL | Status: DC
  Administered 2019-01-12 – 2019-01-14 (×3): 5 mg via ORAL
  Filled 2019-01-12 (×3): qty 1

## 2019-01-12 MED ORDER — POTASSIUM CHLORIDE CRYS ER 20 MEQ PO TBCR
40.0000 meq | EXTENDED_RELEASE_TABLET | Freq: Once | ORAL | Status: AC
  Administered 2019-01-12: 40 meq via ORAL
  Filled 2019-01-12: qty 2

## 2019-01-12 NOTE — Plan of Care (Signed)
  Problem: Clinical Measurements: Goal: Will remain free from infection Outcome: Progressing   Problem: Nutrition: Goal: Adequate nutrition will be maintained Outcome: Progressing   Problem: Activity: Goal: Risk for activity intolerance will decrease Outcome: Progressing   Problem: Coping: Goal: Level of anxiety will decrease Outcome: Completed/Met   Problem: Elimination: Goal: Will not experience complications related to bowel motility Outcome: Progressing Goal: Will not experience complications related to urinary retention Outcome: Progressing   Problem: Pain Managment: Goal: General experience of comfort will improve Outcome: Progressing   Problem: Safety: Goal: Ability to remain free from injury will improve Outcome: Progressing   Problem: Skin Integrity: Goal: Risk for impaired skin integrity will decrease Outcome: Progressing

## 2019-01-12 NOTE — Discharge Summary (Signed)
PROGRESS NOTE    Anna [name]  [id number] DOB: December 25, 1950 DOA: 01/09/2019 PCP: Leanna Battles, MD      Brief Narrative:  Anna [name] is a 68 y.o. F with fibromyalgia, HTN DM, chronic low back pain, restless leg syndrome and previous back surgery who presented with fall.    2 days PTA, patient fell, landing on buttocks.  Since then, has had numbness tingling and inability to walk.    In ER, patient febrile 102.49F, tachycardic, WBC 23K, Cr 1.87.  Started on broad spectrum antibiotics.    Assessment & Plan:  Sepsis from UTI  Presented with fever, tachycardia, and renal failure.  CXR nonspecific.  UA showed pyuria, bacteriuria.  MR lumbar spine showed renal stranding.  US shows no evidence of stone or hydro.  Patient still febrile overnight, cultures show Enterococcus, not susceptible to ceftriaxone.    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  No previous history of chronic kidney disease.  Baseline creatinine in our system several years ago, less than 1.  Renal US shows cortical thinning but no hydro or overt medicorenal disease.  Today Cr down to normal.  FenA consistent with pre-renal injury.  0.6%.  Albuminuria noted. -Repeat urine protein and Cr with PCP in 1 month -Avoid hypotension, nephrotoxins  Back pain Fall Numbness Lumbar x-ray showed only old compression fx.  CT C-spine showed degenerative changes only.  MRI lumbar spine showed no infection, only some L3-4 foraminal RIGHT stenosis and LEFT facet arthritis at L4-S1 Leg weakness is stable.  CK normal, doubt statin myopathy.  Still dizzy with standing.  Hyponatremia Hypovolemic, improved with fluids  Diabetes A1c 8.9% Glucoses well controlled -Continue SSI corrections -Continue aspirin, atorvastatin -Hold metformin   Hypokalemia -Supplement K  Hypertension BP elevated -Resume amlodipine    Transaminitis Probably  ischemic, mild.  Improved -Repeat LFTs with PCP        MDM and disposition: The below labs and imaging reports reviewed and summarized above.  Medication management as above.   The patient was admitted with AKI, sepsis from UTI.    Ongoing inpatient services are reasonable and necessary given the severity of presentation (renal failure, sepsis, dehydration), the fact that she her infection is now found to require different antibiotics than given the last 48 hours, her Temp 100.20F yesterday and the high risk of debility, death, readmission if she were to be discharged too early.      DVT prophylaxis: Lovenox Code Status: Full code Family Communication: Husband at the bedside    Consultants:     Procedures:   US renal  Antimicrobials:   Vanc, Cefepime Flagyl x1  Ceftriaxone 10/22 >>    Subjective: Tingling in the right foot, cramping in both legs, dizziness persists.  No confusion, chest pain, dyspnea, orthopnea.  Right-sided abdominal pain is improved.     Objective: Vitals:   01/12/19 0506 01/12/19 0851 01/12/19 1033 01/12/19 1645  BP: (!) 135/96 (!) 155/79 140/79 (!) 166/84  Pulse: 74 76 72 88  Resp: 16 18    Temp: 98.3 F (36.8 C) 98.1 F (36.7 C)  99.4 F (37.4 C)  TempSrc: Oral Oral  Oral  SpO2: 94% 96%  97%  Weight:      Height:        Intake/Output Summary (Last 24 hours) at 01/12/2019 1921 Last data filed at 01/12/2019 1441 Gross per 24 hour  Intake 1200 ml  Output 3300  ml  Net -2100 ml   Filed Weights   01/09/19 0821 01/09/19 1253  Weight: 74.8 kg 74.8 kg    Examination: General appearance: Adult female, lying in bed, no acute distress. HEENT: Anicteric, conjunctival pink, lids and lashes normal.  No nasal deformity, discharge, or epistaxis.  Lips moist, dentition normal, oropharynx moist, no oral lesions, hearing normal. Skin: Warm and dry, no jaundice, no suspicious rashes or lesions. Cardiac: Regular rate and rhythm, no murmurs  appreciated, JVP normal, no lower extremity edema Respiratory: Normal respiratory rate and rhythm, lungs clear without rales or wheezes, good air entry Abdomen: Abdomen soft, no tenderness palpation or guarding, no ascites or distention. MSK: No muscle bulk and tone. Neuro: Awake and alert, extraocular movements intact, moves all extremities with very global weakness, but normal coordination, speech fluent.    Psych: Intact responding questions, attention normal, affect normal, judgment insight appear normal    Data Reviewed: I have personally reviewed following labs and imaging studies:  CBC: Recent Labs  Lab 01/09/19 0515 01/10/19 0548 01/11/19 0352 01/12/19 0527  WBC 23.2* 15.9* 11.9* 11.4*  NEUTROABS 19.4*  --   --   --   HGB 13.4 11.2* 11.2* 10.8*  HCT 40.2 33.3* 32.6* 31.5*  MCV 87.4 85.4 85.6 84.9  PLT 340 303 294 [id number]   Basic Metabolic Panel: Recent Labs  Lab 01/09/19 0515 01/10/19 0548 01/11/19 0352 01/12/19 0527  NA 132* 134* 137 137  K 4.0 3.4* 3.5 3.2*  CL 96* 101 106 103  CO2 24 22 21* 23  GLUCOSE 214* 101* 123* 131*  BUN 23 25* 19 13  CREATININE 1.87* 1.75* 1.30* 1.18*  CALCIUM 8.8* 8.2* 7.8* 7.8*   GFR: Estimated Creatinine Clearance: 43 mL/min (A) (by C-G formula based on SCr of 1.18 mg/dL (H)). Liver Function Tests: Recent Labs  Lab 01/09/19 0515 01/11/19 0352 01/12/19 0527  AST 25 59* 54*  ALT 26 45* 54*  ALKPHOS 107 111 124  BILITOT 1.0 0.3 0.5  PROT 6.8 5.3* 5.5*  ALBUMIN 3.0* 2.0* 2.0*   No results for input(s): LIPASE, AMYLASE in the last 168 hours. No results for input(s): AMMONIA in the last 168 hours. Coagulation Profile: No results for input(s): INR, PROTIME in the last 168 hours. Cardiac Enzymes: Recent Labs  Lab 01/12/19 0527  CKTOTAL 18*   BNP (last 3 results) No results for input(s): PROBNP in the last 8760 hours. HbA1C: Recent Labs    01/09/19 2254  HGBA1C 8.9*   CBG: Recent Labs  Lab 01/11/19 1628 01/11/19  2051 01/12/19 0652 01/12/19 1145 01/12/19 1645  GLUCAP 116* 162* 125* 271* 159*   Lipid Profile: No results for input(s): CHOL, HDL, LDLCALC, TRIG, CHOLHDL, LDLDIRECT in the last 72 hours. Thyroid Function Tests: No results for input(s): TSH, T4TOTAL, FREET4, T3FREE, THYROIDAB in the last 72 hours. Anemia Panel: No results for input(s): VITAMINB12, FOLATE, FERRITIN, TIBC, IRON, RETICCTPCT in the last 72 hours. Urine analysis:    Component Value Date/Time   COLORURINE AMBER (A) 01/09/2019 1828   APPEARANCEUR CLOUDY (A) 01/09/2019 1828   LABSPEC 1.020 01/09/2019 1828   PHURINE 5.0 01/09/2019 1828   GLUCOSEU 50 (A) 01/09/2019 1828   HGBUR SMALL (A) 01/09/2019 1828   BILIRUBINUR NEGATIVE 01/09/2019 1828   KETONESUR NEGATIVE 01/09/2019 1828   PROTEINUR 100 (A) 01/09/2019 1828   NITRITE NEGATIVE 01/09/2019 1828   LEUKOCYTESUR MODERATE (A) 01/09/2019 1828   Sepsis Labs: @LABRCNTIP (procalcitonin:4,lacticacidven:4)  ) Recent Results (from the past 240 hour(s))  Blood  culture (routine x 2)     Status: None (Preliminary result)   Collection Time: 01/09/19  8:17 AM   Specimen: BLOOD RIGHT HAND  Result Value Ref Range Status   Specimen Description BLOOD RIGHT HAND  Final   Special Requests   Final    BOTTLES DRAWN AEROBIC AND ANAEROBIC Blood Culture results may not be optimal due to an inadequate volume of blood received in culture bottles   Culture   Final    NO GROWTH 3 DAYS Performed at [facility], [place] [address] [place], [place] [address]    Report Status PENDING  Incomplete  Blood culture (routine x 2)     Status: None (Preliminary result)   Collection Time: 01/09/19 10:15 AM   Specimen: BLOOD RIGHT HAND  Result Value Ref Range Status   Specimen Description BLOOD RIGHT HAND  Final   Special Requests   Final    BOTTLES DRAWN AEROBIC ONLY Blood Culture adequate volume   Culture   Final    NO GROWTH 3 DAYS Performed at [facility], [place] [address]  [place], [place] [address]    Report Status PENDING  Incomplete  SARS Coronavirus 2 by RT PCR (hospital order, performed in [facility] hospital lab) Nasopharyngeal Nasopharyngeal Swab     Status: None   Collection Time: 01/09/19 12:58 PM   Specimen: Nasopharyngeal Swab  Result Value Ref Range Status   SARS Coronavirus 2 NEGATIVE NEGATIVE Final    Comment: (NOTE) If result is NEGATIVE SARS-CoV-2 target nucleic acids are NOT DETECTED. The SARS-CoV-2 RNA is generally detectable in upper and lower  respiratory specimens during the acute phase of infection. The lowest  concentration of SARS-CoV-2 viral copies this assay can detect is 250  copies / mL. A negative result does not preclude SARS-CoV-2 infection  and should not be used as the sole basis for treatment or other  patient management decisions.  A negative result may occur with  improper specimen collection / handling, submission of specimen other  than nasopharyngeal swab, presence of viral mutation(s) within the  areas targeted by this assay, and inadequate number of viral copies  (<250 copies / mL). A negative result must be combined with clinical  observations, patient history, and epidemiological information. If result is POSITIVE SARS-CoV-2 target nucleic acids are DETECTED. The SARS-CoV-2 RNA is generally detectable in upper and lower  respiratory specimens dur ing the acute phase of infection.  Positive  results are indicative of active infection with SARS-CoV-2.  Clinical  correlation with patient history and other diagnostic information is  necessary to determine patient infection status.  Positive results do  not rule out bacterial infection or co-infection with other viruses. If result is PRESUMPTIVE POSTIVE SARS-CoV-2 nucleic acids MAY BE PRESENT.   A presumptive positive result was obtained on the submitted specimen  and confirmed on repeat testing.  While 2019 novel coronavirus  (SARS-CoV-2) nucleic acids may be present  in the submitted sample  additional confirmatory testing may be necessary for epidemiological  and / or clinical management purposes  to differentiate between  SARS-CoV-2 and other Sarbecovirus currently known to infect humans.  If clinically indicated additional testing with an alternate test  methodology [phone]) is advised. The SARS-CoV-2 RNA is generally  detectable in upper and lower respiratory sp ecimens during the acute  phase of infection. The expected result is Negative. Fact Sheet for Patients:  StrictlyIdeas.no Fact Sheet for Healthcare Providers: BankingDealers.co.za This test is not yet  approved or cleared by the [place] and has been authorized for detection and/or diagnosis of SARS-CoV-2 by FDA under an Emergency Use Authorization (EUA).  This EUA will remain in effect (meaning this test can be used) for the duration of the COVID-19 declaration under Section 564(b)(1) of the Act, 21 U.S.C. section 360bbb-3(b)(1), unless the authorization is terminated or revoked sooner. Performed at [facility], [place] [address] [place], [place] [address]   Urine Culture     Status: Abnormal   Collection Time: 01/10/19  1:42 PM   Specimen: Urine, Clean Catch  Result Value Ref Range Status   Specimen Description URINE, CLEAN CATCH  Final   Special Requests   Final    NONE Performed at [facility], [place] [address] [place], [place] [address]    Culture >=100,000 COLONIES/mL ENTEROCOCCUS FAECALIS (A)  Final   Report Status 01/12/2019 FINAL  Final   Organism ID, Bacteria ENTEROCOCCUS FAECALIS (A)  Final      Susceptibility   Enterococcus faecalis - MIC*    AMPICILLIN <=2 SENSITIVE Sensitive     LEVOFLOXACIN 1 SENSITIVE Sensitive     NITROFURANTOIN <=16 SENSITIVE Sensitive     VANCOMYCIN 1 SENSITIVE Sensitive     * >=100,000 COLONIES/mL ENTEROCOCCUS FAECALIS         Radiology Studies: No results found.       Scheduled Meds: . atorvastatin  40 mg Oral q1800  . enoxaparin (LOVENOX) injection  40 mg Subcutaneous Q24H  . insulin aspart  0-5 Units Subcutaneous QHS  . insulin aspart  0-9 Units Subcutaneous TID WC  . saccharomyces boulardii  250 mg Oral BID   Continuous Infusions: . ampicillin (OMNIPEN) IV 1 g (01/12/19 1708)     LOS: 3 days    Time spent: 25 minutes    Edwin Dada, MD [facility] 01/12/2019, 7:21 PM     Please page through [place]:  [email or web address] Password [email or web address] If 7PM-7AM, please contact night-coverage

## 2019-01-13 DIAGNOSIS — I10 Essential (primary) hypertension: Secondary | ICD-10-CM | POA: Diagnosis not present

## 2019-01-13 DIAGNOSIS — N179 Acute kidney failure, unspecified: Secondary | ICD-10-CM | POA: Diagnosis not present

## 2019-01-13 DIAGNOSIS — A419 Sepsis, unspecified organism: Secondary | ICD-10-CM | POA: Diagnosis not present

## 2019-01-13 DIAGNOSIS — E871 Hypo-osmolality and hyponatremia: Secondary | ICD-10-CM | POA: Diagnosis not present

## 2019-01-13 LAB — COMPREHENSIVE METABOLIC PANEL
ALT: 49 U/L — ABNORMAL HIGH (ref 0–44)
AST: 35 U/L (ref 15–41)
Albumin: 2.1 g/dL — ABNORMAL LOW (ref 3.5–5.0)
Alkaline Phosphatase: 128 U/L — ABNORMAL HIGH (ref 38–126)
Anion gap: 12 (ref 5–15)
BUN: 11 mg/dL (ref 8–23)
CO2: 24 mmol/L (ref 22–32)
Calcium: 8.2 mg/dL — ABNORMAL LOW (ref 8.9–10.3)
Chloride: 105 mmol/L (ref 98–111)
Creatinine, Ser: 1.17 mg/dL — ABNORMAL HIGH (ref 0.44–1.00)
GFR calc Af Amer: 55 mL/min — ABNORMAL LOW (ref >60)
GFR calc non Af Amer: 47 mL/min — ABNORMAL LOW (ref >60)
Glucose, Bld: 147 mg/dL — ABNORMAL HIGH (ref 70–99)
Potassium: 3.4 mmol/L — ABNORMAL LOW (ref 3.5–5.1)
Sodium: 141 mmol/L (ref 135–145)
Total Bilirubin: 0.3 mg/dL (ref 0.3–1.2)
Total Protein: 5.8 g/dL — ABNORMAL LOW (ref 6.5–8.1)

## 2019-01-13 LAB — CBC
HCT: 34.3 % — ABNORMAL LOW (ref 36.0–46.0)
Hemoglobin: 11.8 g/dL — ABNORMAL LOW (ref 12.0–15.0)
MCH: 29.1 pg (ref 26.0–34.0)
MCHC: 34.4 g/dL (ref 30.0–36.0)
MCV: 84.7 fL (ref 80.0–100.0)
Platelets: 440 10*3/uL — ABNORMAL HIGH (ref 150–400)
RBC: 4.05 MIL/uL (ref 3.87–5.11)
RDW: 14.1 % (ref 11.5–15.5)
WBC: 13.1 10*3/uL — ABNORMAL HIGH (ref 4.0–10.5)
nRBC: 0 % (ref 0.0–0.2)

## 2019-01-13 LAB — GLUCOSE, CAPILLARY
Glucose-Capillary: 156 mg/dL — ABNORMAL HIGH (ref 70–99)
Glucose-Capillary: 352 mg/dL — ABNORMAL HIGH (ref 70–99)
Glucose-Capillary: 154 mg/dL — ABNORMAL HIGH (ref 70–99)
Glucose-Capillary: 135 mg/dL — ABNORMAL HIGH (ref 70–99)

## 2019-01-13 MED ORDER — POTASSIUM CHLORIDE CRYS ER 20 MEQ PO TBCR
40.0000 meq | EXTENDED_RELEASE_TABLET | Freq: Two times a day (BID) | ORAL | Status: AC
  Administered 2019-01-13 (×2): 40 meq via ORAL
  Filled 2019-01-13 (×2): qty 2

## 2019-01-13 NOTE — Progress Notes (Signed)
Inpatient Diabetes Program Recommendations  AACE/ADA: New Consensus Statement on Inpatient Glycemic Control (2015)  Target Ranges:  Prepandial:   less than 140 mg/dL      Peak postprandial:   less than 180 mg/dL (1-2 hours)      Critically ill patients:  140 - 180 mg/dL   Results for Anna [name], Anna [name] (MRN [id number]) as of 01/13/2019 15:08  Ref. Range 01/12/2019 06:52 01/12/2019 11:45 01/12/2019 16:45 01/12/2019 21:02  Glucose-Capillary Latest Ref Range: 70 - 99 mg/dL 125 (H)  1 unit NOVOLOG  271 (H)  5 units NOVOLOG  159 (H)  2 units NOVOLOG  163 (H)   Results for Anna [name], Anna [name] (MRN [id number]) as of 01/13/2019 15:08  Ref. Range 01/13/2019 07:01 01/13/2019 11:36  Glucose-Capillary Latest Ref Range: 70 - 99 mg/dL 156 (H)  2 units NOVOLOG  352 (H)  9 units NOVOLOG      Admit with: Sepsis/ UTI/ Pyelonephritis/ Fall with Back Pain  History: DM, CKD  Home DM Meds: Metformin (Glumetza) 500 mg Daily  Current Orders: Novolog Sensitive Correction Scale/ SSI (0-9 units) TID AC + HS     MD- Note that 12pm CBG quite elevated the last 2 days (271 mg/dl yesterday 10/25 and 352 mg/dl today)  May consider adding low dose Novolog Meal Coverage with breakfast only:  Novolog 4 units with Breakfast  Continue Novolog SSI TID       --Will follow patient during hospitalization--  Wyn Quaker RN, MSN, CDE Diabetes Coordinator Inpatient Glycemic Control Team Team Pager: [phone] (8a-5p)

## 2019-01-13 NOTE — Plan of Care (Signed)
  Problem: Activity: Goal: Risk for activity intolerance will decrease Outcome: Progressing   

## 2019-01-13 NOTE — Progress Notes (Signed)
PROGRESS NOTE    ABBRA BERTHELOT  [id number] DOB: [birth year] DOA: 01/09/2019 PCP: Leanna Battles, MD      Brief Narrative:  Mrs. Masley is a 68 y.o. F with fibromyalgia, HTN DM, chronic low back pain, restless leg syndrome and previous back surgery who presented with fall.    2 days PTA, patient fell, landing on buttocks.  Since then, has had numbness tingling and inability to walk.    In ER, patient febrile 102.90F, tachycardic, WBC 23K, Cr 1.87.  Started on broad spectrum antibiotics.    Assessment & Plan:  Sepsis from UTI  Presented with fever, tachycardia, and renal failure.  CXR nonspecific.  UA showed pyuria, bacteriuria.  MR lumbar spine showed renal stranding.  US shows no evidence of stone or hydro.  Febrile again last night, still malaise, lassitude.  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  No previous history of chronic kidney disease.  Baseline creatinine in our system several years ago, less than 1.  Renal US shows cortical thinning but no hydro or overt medicorenal disease.  Cr now normalized, stable.  FenA consistent with pre-renal injury.  0.6%.  Albuminuria noted. -Repeat urine protein and Cr with PCP in 1 month -Avoid hypotension, nephrotoxins  Hypokalemia -Replete K  Back pain Fall Numbness Lumbar x-ray showed only old compression fx.  CT C-spine showed degenerative changes only.  MRI lumbar spine showed no infection, only some L3-4 foraminal RIGHT stenosis and LEFT facet arthritis at L4-S1 Leg weakness is stable.  CK normal, doubt statin myopathy.  Still dizzy with standing.  Hyponatremia Hypovolemic, improved with fluids  Diabetes A1c 8.9% GLucoses good. -Continue SSI corrections -Continue aspirin, atorvastatin -Hold metformin   Hypertension BP high -Continue amlodipine    Transaminitis Probably ischemic, mild.  Improved again today.  LIkely NASH.   Steatosis on renal US> -Repeat LFTs with PCP        MDM and disposition: The below labs and imaging reports reviewed and summarized above.  Medication management as above.   The patient was admitted with AKI, sepsis from UTI.    Ongoing inpatient services are reasonable and necessary given the severity of presentation (renal failure, sepsis, dehydration), and continued fever and malaise.          DVT prophylaxis: Lovenox Code Status: Full code Family Communication:      Consultants:     Procedures:   US renal  Antimicrobials:   Vanc, Cefepime Flagyl x1  Ceftriaxone 10/22 >>    Subjective: Fever and sweats overnight.  No vomiting confusion.  Weakness is generalized.  No abdominal pain     Objective: Vitals:   01/12/19 1645 01/12/19 2103 01/13/19 0202 01/13/19 0545  BP: (!) 166/84 (!) 161/71  (!) 153/68  Pulse: 88 80  72  Resp:  18  18  Temp: 99.4 F (37.4 C) (!) 100.6 F (38.1 C) 99 F (37.2 C) 97.9 F (36.6 C)  TempSrc: Oral Oral Oral Oral  SpO2: 97% 95%  97%  Weight:      Height:        Intake/Output Summary (Last 24 hours) at 01/13/2019 [id number] Last data filed at 01/13/2019 0600 Gross per 24 hour  Intake 1380 ml  Output 2100 ml  Net -720 ml   Filed Weights   01/09/19 0821 01/09/19 1253  Weight: 74.8 kg 74.8 kg    Examination: General appearance: Adult female, lying in bed, interactive  no obvious distress HEENT: Anicteric, conjunctival pink, lids and lashes normal.  No nasal deformity, discharge, or epistaxis.  Lips moist, dentition normal, oropharynx moist, no oral lesions, hearing normal. Skin: Warm and dry, no jaundice, no suspicious rashes or lesions. Cardiac: RRR, no murmurs, normal JVP, no LE edema Respiratory: RR normal, no rales or wheezing. Abdomen: Abd soft, no TTP, no guarding MSK:   Neuro: Awake and alert, EOMI, moves all extgremities with normal strength and coordination, speech fluent  Psych: Intact and responding to  questions, oriented x3, affet normal    Data Reviewed: I have personally reviewed following labs and imaging studies:  CBC: Recent Labs  Lab 01/09/19 0515 01/10/19 0548 01/11/19 0352 01/12/19 0527 01/13/19 0522  WBC 23.2* 15.9* 11.9* 11.4* 13.1*  NEUTROABS 19.4*  --   --   --   --   HGB 13.4 11.2* 11.2* 10.8* 11.8*  HCT 40.2 33.3* 32.6* 31.5* 34.3*  MCV 87.4 85.4 85.6 84.9 84.7  PLT 340 303 294 363 [id number]*   Basic Metabolic Panel: Recent Labs  Lab 01/09/19 0515 01/10/19 0548 01/11/19 0352 01/12/19 0527 01/13/19 0522  NA 132* 134* 137 137 141  K 4.0 3.4* 3.5 3.2* 3.4*  CL 96* 101 106 103 105  CO2 24 22 21* 23 24  GLUCOSE 214* 101* 123* 131* 147*  BUN 23 25* 19 13 11   CREATININE 1.87* 1.75* 1.30* 1.18* 1.17*  CALCIUM 8.8* 8.2* 7.8* 7.8* 8.2*   GFR: Estimated Creatinine Clearance: 43.4 mL/min (A) (by C-G formula based on SCr of 1.17 mg/dL (H)). Liver Function Tests: Recent Labs  Lab 01/09/19 0515 01/11/19 0352 01/12/19 0527 01/13/19 0522  AST 25 59* 54* 35  ALT 26 45* 54* 49*  ALKPHOS 107 111 124 128*  BILITOT 1.0 0.3 0.5 0.3  PROT 6.8 5.3* 5.5* 5.8*  ALBUMIN 3.0* 2.0* 2.0* 2.1*   No results for input(s): LIPASE, AMYLASE in the last 168 hours. No results for input(s): AMMONIA in the last 168 hours. Coagulation Profile: No results for input(s): INR, PROTIME in the last 168 hours. Cardiac Enzymes: Recent Labs  Lab 01/12/19 0527  CKTOTAL 18*   BNP (last 3 results) No results for input(s): PROBNP in the last 8760 hours. HbA1C: No results for input(s): HGBA1C in the last 72 hours. CBG: Recent Labs  Lab 01/12/19 0652 01/12/19 1145 01/12/19 1645 01/12/19 2102 01/13/19 0701  GLUCAP 125* 271* 159* 163* 156*   Lipid Profile: No results for input(s): CHOL, HDL, LDLCALC, TRIG, CHOLHDL, LDLDIRECT in the last 72 hours. Thyroid Function Tests: No results for input(s): TSH, T4TOTAL, FREET4, T3FREE, THYROIDAB in the last 72 hours. Anemia Panel: No results  for input(s): VITAMINB12, FOLATE, FERRITIN, TIBC, IRON, RETICCTPCT in the last 72 hours. Urine analysis:    Component Value Date/Time   COLORURINE AMBER (A) 01/09/2019 1828   APPEARANCEUR CLOUDY (A) 01/09/2019 1828   LABSPEC 1.020 01/09/2019 1828   PHURINE 5.0 01/09/2019 1828   GLUCOSEU 50 (A) 01/09/2019 1828   HGBUR SMALL (A) 01/09/2019 1828   BILIRUBINUR NEGATIVE 01/09/2019 1828   KETONESUR NEGATIVE 01/09/2019 1828   PROTEINUR 100 (A) 01/09/2019 1828   NITRITE NEGATIVE 01/09/2019 1828   LEUKOCYTESUR MODERATE (A) 01/09/2019 1828   Sepsis Labs: @LABRCNTIP (procalcitonin:4,lacticacidven:4)  ) Recent Results (from the past 240 hour(s))  Blood culture (routine x 2)     Status: None (Preliminary result)   Collection Time: 01/09/19  8:17 AM   Specimen: BLOOD RIGHT HAND  Result Value Ref Range Status   Specimen  Description BLOOD RIGHT HAND  Final   Special Requests   Final    BOTTLES DRAWN AEROBIC AND ANAEROBIC Blood Culture results may not be optimal due to an inadequate volume of blood received in culture bottles   Culture   Final    NO GROWTH 4 DAYS Performed at [facility], [place] [address], [place], [place] [address]    Report Status PENDING  Incomplete  Blood culture (routine x 2)     Status: None (Preliminary result)   Collection Time: 01/09/19 10:15 AM   Specimen: BLOOD RIGHT HAND  Result Value Ref Range Status   Specimen Description BLOOD RIGHT HAND  Final   Special Requests   Final    BOTTLES DRAWN AEROBIC ONLY Blood Culture adequate volume   Culture   Final    NO GROWTH 4 DAYS Performed at [facility], [place] [address] [place], [place] [address]    Report Status PENDING  Incomplete  SARS Coronavirus 2 by RT PCR (hospital order, performed in [facility] hospital lab) Nasopharyngeal Nasopharyngeal Swab     Status: None   Collection Time: 01/09/19 12:58 PM   Specimen: Nasopharyngeal Swab  Result Value Ref Range Status   SARS Coronavirus 2 NEGATIVE NEGATIVE  Final    Comment: (NOTE) If result is NEGATIVE SARS-CoV-2 target nucleic acids are NOT DETECTED. The SARS-CoV-2 RNA is generally detectable in upper and lower  respiratory specimens during the acute phase of infection. The lowest  concentration of SARS-CoV-2 viral copies this assay can detect is 250  copies / mL. A negative result does not preclude SARS-CoV-2 infection  and should not be used as the sole basis for treatment or other  patient management decisions.  A negative result may occur with  improper specimen collection / handling, submission of specimen other  than nasopharyngeal swab, presence of viral mutation(s) within the  areas targeted by this assay, and inadequate number of viral copies  (<250 copies / mL). A negative result must be combined with clinical  observations, patient history, and epidemiological information. If result is POSITIVE SARS-CoV-2 target nucleic acids are DETECTED. The SARS-CoV-2 RNA is generally detectable in upper and lower  respiratory specimens dur ing the acute phase of infection.  Positive  results are indicative of active infection with SARS-CoV-2.  Clinical  correlation with patient history and other diagnostic information is  necessary to determine patient infection status.  Positive results do  not rule out bacterial infection or co-infection with other viruses. If result is PRESUMPTIVE POSTIVE SARS-CoV-2 nucleic acids MAY BE PRESENT.   A presumptive positive result was obtained on the submitted specimen  and confirmed on repeat testing.  While 2019 novel coronavirus  (SARS-CoV-2) nucleic acids may be present in the submitted sample  additional confirmatory testing may be necessary for epidemiological  and / or clinical management purposes  to differentiate between  SARS-CoV-2 and other Sarbecovirus currently known to infect humans.  If clinically indicated additional testing with an alternate test  methodology [phone]) is advised. The  SARS-CoV-2 RNA is generally  detectable in upper and lower respiratory sp ecimens during the acute  phase of infection. The expected result is Negative. Fact Sheet for Patients:  StrictlyIdeas.no Fact Sheet for Healthcare Providers: BankingDealers.co.za This test is not yet approved or cleared by the [place] FDA and has been authorized for detection and/or diagnosis of SARS-CoV-2 by FDA under an Emergency Use Authorization (EUA).  This EUA will remain in effect (meaning this  test can be used) for the duration of the COVID-19 declaration under Section 564(b)(1) of the Act, 21 U.S.C. section 360bbb-3(b)(1), unless the authorization is terminated or revoked sooner. Performed at [facility], [place] [address], [place], [place] [address]   Urine Culture     Status: Abnormal   Collection Time: 01/10/19  1:42 PM   Specimen: Urine, Clean Catch  Result Value Ref Range Status   Specimen Description URINE, CLEAN CATCH  Final   Special Requests   Final    NONE Performed at [facility], [place] [address] [place], [place] [address]    Culture >=100,000 COLONIES/mL ENTEROCOCCUS FAECALIS (A)  Final   Report Status 01/12/2019 FINAL  Final   Organism ID, Bacteria ENTEROCOCCUS FAECALIS (A)  Final      Susceptibility   Enterococcus faecalis - MIC*    AMPICILLIN <=2 SENSITIVE Sensitive     LEVOFLOXACIN 1 SENSITIVE Sensitive     NITROFURANTOIN <=16 SENSITIVE Sensitive     VANCOMYCIN 1 SENSITIVE Sensitive     * >=100,000 COLONIES/mL ENTEROCOCCUS FAECALIS         Radiology Studies: No results found.      Scheduled Meds: . amLODipine  5 mg Oral Daily  . atorvastatin  40 mg Oral q1800  . enoxaparin (LOVENOX) injection  40 mg Subcutaneous Q24H  . insulin aspart  0-5 Units Subcutaneous QHS  . insulin aspart  0-9 Units Subcutaneous TID WC  . potassium chloride  40 mEq Oral BID  . saccharomyces boulardii  250 mg Oral BID    Continuous Infusions: . ampicillin (OMNIPEN) IV 1 g (01/13/19 0600)     LOS: 4 days    Time spent: 25 minutes    Edwin Dada, MD [facility] 01/13/2019, 8:21 AM     Please page through [place]:  [email or web address] Password [email or web address] If 7PM-7AM, please contact night-coverage

## 2019-01-14 DIAGNOSIS — A419 Sepsis, unspecified organism: Secondary | ICD-10-CM | POA: Diagnosis not present

## 2019-01-14 DIAGNOSIS — N179 Acute kidney failure, unspecified: Secondary | ICD-10-CM | POA: Diagnosis not present

## 2019-01-14 DIAGNOSIS — E871 Hypo-osmolality and hyponatremia: Secondary | ICD-10-CM | POA: Diagnosis not present

## 2019-01-14 DIAGNOSIS — I10 Essential (primary) hypertension: Secondary | ICD-10-CM | POA: Diagnosis not present

## 2019-01-14 LAB — CBC
HCT: 35.8 % — ABNORMAL LOW (ref 36.0–46.0)
Hemoglobin: 11.9 g/dL — ABNORMAL LOW (ref 12.0–15.0)
MCH: 28.7 pg (ref 26.0–34.0)
MCHC: 33.2 g/dL (ref 30.0–36.0)
MCV: 86.5 fL (ref 80.0–100.0)
Platelets: 530 10*3/uL — ABNORMAL HIGH (ref 150–400)
RBC: 4.14 MIL/uL (ref 3.87–5.11)
RDW: 14.2 % (ref 11.5–15.5)
WBC: 14.5 10*3/uL — ABNORMAL HIGH (ref 4.0–10.5)
nRBC: 0 % (ref 0.0–0.2)

## 2019-01-14 LAB — COMPREHENSIVE METABOLIC PANEL
ALT: 39 U/L (ref 0–44)
AST: 22 U/L (ref 15–41)
Albumin: 2.2 g/dL — ABNORMAL LOW (ref 3.5–5.0)
Alkaline Phosphatase: 119 U/L (ref 38–126)
Anion gap: 10 (ref 5–15)
BUN: 10 mg/dL (ref 8–23)
CO2: 23 mmol/L (ref 22–32)
Calcium: 8.5 mg/dL — ABNORMAL LOW (ref 8.9–10.3)
Chloride: 105 mmol/L (ref 98–111)
Creatinine, Ser: 1.14 mg/dL — ABNORMAL HIGH (ref 0.44–1.00)
GFR calc Af Amer: 56 mL/min — ABNORMAL LOW (ref >60)
GFR calc non Af Amer: 49 mL/min — ABNORMAL LOW (ref >60)
Glucose, Bld: 191 mg/dL — ABNORMAL HIGH (ref 70–99)
Potassium: 3.7 mmol/L (ref 3.5–5.1)
Sodium: 138 mmol/L (ref 135–145)
Total Bilirubin: 0.6 mg/dL (ref 0.3–1.2)
Total Protein: 6.4 g/dL — ABNORMAL LOW (ref 6.5–8.1)

## 2019-01-14 LAB — CULTURE, BLOOD (ROUTINE X 2)
Culture: NO GROWTH
Culture: NO GROWTH
Special Requests: ADEQUATE

## 2019-01-14 LAB — GLUCOSE, CAPILLARY: Glucose-Capillary: 124 mg/dL — ABNORMAL HIGH (ref 70–99)

## 2019-01-14 MED ORDER — AMOXICILLIN 500 MG PO CAPS: 500.0000 mg | ORAL_CAPSULE | Freq: Three times a day (TID) | ORAL | 0 refills | Status: AC

## 2019-01-14 NOTE — Discharge Summary (Signed)
Physician Discharge Summary  Anna [name] [id number] DOB: Sep 13, 1950 DOA: 01/09/2019  PCP: Leanna Battles, MD  Admit date: 01/09/2019 Discharge date: 01/14/2019  Admitted From: Home  Disposition:  Home   Recommendations for Outpatient Follow-up:  1. Follow up with PCP in 1-2 weeks 2. Dr. Philip Aspen: Please obtain LFTs and BMP and urine protein in 1-2 weeks to follow up AKI and transaminitis     Home Health: None  Equipment/Devices: Rolling walker  Discharge Condition: Good  CODE STATUS: FULL Diet recommendation: Diabetic  Brief/Interim Summary: Anna [name] is a 68 y.o. F with HTN DM, chronic low back pain, restless leg syndrome and previous back surgery who presented with fall.    Over ~24 hours prior to admission, patient had new onset dark urine, back pain and malaise.  Then on night of admission, she was too weak to stand and so came to the ER.    In ER, patient febrile 102.43F, tachycardic, WBC 23K, Cr 1.87.  Started on broad spectrum antibiotics.        PRINCIPAL HOSPITAL DIAGNOSIS: Sepsis from enterococcus UTI    Discharge Diagnoses:   Sepsis from UTI  Presented with fever, tachycardia, and renal failure.  CXR nonspecific.  UA showed pyuria, bacteriuria.  MR lumbar spine showed right renal stranding corresponding to her right upper quadrant tenderness.  US showed no evidence of abscess, stone or hydro.  Given Vanc in ER then started on ceftriaxone.  Blood cultures no growth.  Had persistent fever and urine culture growing ceftriaxone.  Switched to ampicillin, and fever resolved.  Discharged to complete 7 days antibiotics.    AKI Non-anion gap metabolic acidosis Cr [id number] mg/dL at admission, with mild non gap acidosis.  FenA consistent with pre-renal injury.  0.6%.  Albuminuria noted. Given fluids and Cr normalized to 1.17 mg/dL.    Hypokalemia  Back pain Fall Numbness Patient complained of back pain, leg numbness and bilateral leg  weakness on presentation.  Lumbar x-ray showed only old compression fx.  CT C-spine showed degenerative changes only.  MRI lumbar spine showed no infection, only some L3-4 foraminal RIGHT stenosis and LEFT facet arthritis at L4-S1.  CK normal, doubt statin myopathy.   On examination by me and PT, she had no gait abnormality or decreased leg strength.   Hyponatremia Resolved with fluids  Diabetes A1c 8.9%   Hypertension    Transaminitis Steatosis on US renal.  Mild transaminitis here, likely sepsis related in setting of probably some underlying NASH. -Follow up in 1 month           Discharge Instructions  Discharge Instructions    Ambulatory referral to Physical Therapy   Complete by: As directed    Discharge instructions   Complete by: As directed    From Dr. Loleta Books: You were admitted for a urinary tract/kidney infection with early sepsis. When you got here, you also had a brief kidney failure and a small bump in your liver numbers.  Both are back to normal with fluids and treating your infection.    Thankfully, you were treated promptly with antibiotics, and we determined the exact bacteria causing your infection.  Finish therapy with 5 more days antibiotics: Take amoxicillin 500 mg three times daily starting this afternoon  Sometimes antibiotics for infections kill off the good bacteria in your gut and cause diarrhea. To prevent this, take a probiotic for the next week (you can stop after you finish the amoxicillin) Probiotics are found over the counter at any pharmacy  or health store.   No one probiotic formulation is better than another, but ask the pharmacist to direct you to the probiotics that they have in stock.   Follow up with Dr. Philip Aspen in 1 week Ask him to check your kidney levels (creatinine) and liver function tests to make sure they are totally back to normal  I have made a referral to physical therapy, you should continue to work on  strengthening your legs until you are back to normal   Given that you have had this bump in your kidney numbers, even though your kidney are back to normal, I would be cautious in the future about using NSAID pain relievers (this includes anything with ibuprofen, naproxen in them, including Motrin, Advil and Aleve) A little may be okay, but be cautious when using these  Drink  2 liters water per day.   Increase activity slowly   Complete by: As directed      Allergies as of 01/14/2019      Reactions   Benadryl [diphenhydramine Hcl] Other (See Comments)   chills      Medication List    STOP taking these medications   naproxen sodium 220 MG tablet Commonly known as: ALEVE     TAKE these medications   amLODipine 5 MG tablet Commonly known as: NORVASC Take 5 mg by mouth daily.   amoxicillin 500 MG capsule Commonly known as: AMOXIL Take 1 capsule (500 mg total) by mouth 3 (three) times daily for 6 days.   aspirin EC 81 MG tablet Take 81 mg by mouth daily.   atorvastatin 40 MG tablet Commonly known as: LIPITOR   gabapentin 100 MG capsule Commonly known as: NEURONTIN Take 1 capsule (100 mg total) by mouth 3 (three) times daily. When necessary for neuropathy pain What changed: additional instructions   GLUCOSAMINE PO Take 1 tablet by mouth 2 (two) times daily.   metFORMIN 500 MG (MOD) 24 hr tablet Commonly known as: GLUMETZA Take 500 mg by mouth daily with breakfast.            Durable Medical Equipment  (From admission, onward)         Start     Ordered   01/14/19 0858  For home use only DME Walker rolling  [facility])  Once    Question:  Patient needs a walker to treat with the following condition  Answer:  Sepsis ([place])   01/14/19 0903          Allergies  Allergen Reactions  . Benadryl [Diphenhydramine Hcl] Other (See Comments)    chills    Consultations:  None   Procedures/Studies: Dg Lumbar Spine Complete  Result Date: 01/09/2019 CLINICAL  DATA:  Low back pain for 2 days EXAM: LUMBAR SPINE - COMPLETE 4+ VIEW COMPARISON:  07/15/2018 FINDINGS: There is no evidence of acute lumbar spine fracture. Remote L2 compression fracture with superior endplate depression. Diffuse degenerative disc narrowing with endplate spurring. Asymmetric height loss and leftward translation at L3-4 causes levoscoliosis. Lumbar degenerative facet spurring greatest inferiorly and on the left. Atherosclerotic calcification. IMPRESSION: 1. No acute finding or change from April 2020. 2. Remote L2 compression fracture. 3. Advanced degenerative disease with levoscoliosis. Electronically Signed   By: Monte Fantasia M.D.   On: 01/09/2019 05:33   Ct Cervical Spine Wo Contrast  Result Date: 01/09/2019 CLINICAL DATA:  C-spine trauma, posterior neck pain that radiates to both shoulders. EXAM: CT CERVICAL SPINE WITHOUT CONTRAST TECHNIQUE: Multidetector CT imaging of the cervical  spine was performed without intravenous contrast. Multiplanar CT image reconstructions were also generated. COMPARISON:  Cervical spine evaluation from 12/20/2015. FINDINGS: Alignment: Normal. Skull base and vertebrae: No acute fracture. No primary bone lesion or focal pathologic process. Soft tissues and spinal canal: No prevertebral fluid or swelling. No visible canal hematoma. Disc levels: Degenerative changes present in the spine worse at C1-2 and with facet disease on the left at C4-5. Milder facet degenerative changes are noted in the lower cervical spine. Disc space narrowing is greatest at C6-C7. Upper chest: Choose 1 Other: None IMPRESSION: Degenerative changes in the cervical spine. No fracture or traumatic malalignment. Electronically Signed   By: Zetta Bills M.D.   On: 01/09/2019 10:22   Mr Lumbar Spine Wo Contrast  Result Date: 01/09/2019 CLINICAL DATA:  Radiculopathy. Low back pain. The patient fell yesterday. Fever. Elevated white blood count. Prior lumbar surgery. And lumbar MRI dated  07/05/2018 EXAM: MRI LUMBAR SPINE WITHOUT CONTRAST TECHNIQUE: Multiplanar, multisequence MR imaging of the lumbar spine was performed. No intravenous contrast was administered. COMPARISON:  Lumbar radiographs dated 01/09/2019 FINDINGS: Segmentation:  Standard. Alignment: Slight chronic retrolisthesis of L2 on L3 and of L3 on L4, stable. Vertebrae: No acute abnormalities. Old healed compression deformity of L2 with Schmorl's nodes in the inferior and superior endplates. Conus medullaris and cauda equina: Conus extends to the L1 level. Conus and cauda equina appear normal. Paraspinal and other soft tissues: There is new slight soft tissue stranding around the right kidney with slight prominence of the right renal pelvis. The ureter does not appear dilated. There is also a new small reactive node under right renal artery. The possibility of right urinary tract infection should be considered. Disc levels: L1-2: Disc desiccation. No disc bulging or protrusion. No change since the prior study. L2-3: Small disc bulge with accompanying osteophytes extending into the right neural foramen with moderate right foraminal stenosis. Hypertrophy of the right facet joint and ligamentum flavum impinges upon the right lateral recess, essentially unchanged. L3-4: Chronic retrolisthesis and disc space narrowing with a small broad-based protrusion of the disc asymmetric into the right neural foramen and right lateral recess. Interval right laminotomy with decompression of the thecal sac. Chronic facet arthritis. Moderately severe right foraminal stenosis. The right L3 nerve appears to exit without impingement. L4-5: No significant disc bulging or protrusion. Severe left and moderate right facet arthritis. Previous right laminectomy with decompression of the thecal sac. No focal neural impingement. L5-S1: Severe bilateral facet arthritis, unchanged. No disc bulging or protrusion. IMPRESSION: 1. New soft tissue stranding around the right  kidney with slight prominence of the right renal pelvis. The possibility of right urinary tract infection should be considered. 2. Interval right laminotomy at L3-4 with decompression of the thecal sac. 3. Moderately severe right foraminal stenosis at L3-4. 4. Severe left facet arthritis at L4-5 and L5-S1. 5. No evidence of discitis, osteomyelitis, or other infection involving the lumbar spine. 6. No acute fractures. Electronically Signed   By: Lorriane Shire M.D.   On: 01/09/2019 17:58   US Renal  Result Date: 01/09/2019 CLINICAL DATA:  History of sepsis, elevated creatinine/acute renal failure. EXAM: RENAL / URINARY TRACT ULTRASOUND COMPLETE COMPARISON:  None FINDINGS: Right Kidney: Renal measurements: 12.5 x 5.5 x 5.0 (volume = 180) cm = volume: 180 mL. Mild cortical scarring, no hydronephrosis. Mild parenchymal thinning. Left Kidney: Renal measurements: 10.3 x 4.8 x 4.5 (volume = 120) cm = volume: 120 mL. Decreased corticomedullary differentiation, cortical thinning and cortical scarring  similar to the contralateral kidney, no signs of hydronephrosis. No visible lesion or calculus. Bladder: Appears normal for degree of bladder distention. Other: Echogenic hepatic parenchyma suggests steatosis. IMPRESSION: 1. Renal cortical thinning and parenchymal scarring. 2. Mild hepatic steatosis. Electronically Signed   By: Zetta Bills M.D.   On: 01/09/2019 15:51   Dg Chest Portable 1 View  Result Date: 01/09/2019 CLINICAL DATA:  [place] yesterday.  Shortness of breath and fever. EXAM: PORTABLE CHEST 1 VIEW COMPARISON:  08/08/2005 FINDINGS: The cardiac silhouette, mediastinal and hilar contours are normal. There is mild eventration right hemidiaphragm with overlying vascular crowding and atelectasis. Could not exclude a small right effusion. The left lung is clear. The bony thorax appears intact.  No definite rib fractures. IMPRESSION: Mild eventration of the right hemidiaphragm with overlying vascular crowding and  streaky atelectasis. Possible small right pleural effusion. Grossly intact bony thorax. Electronically Signed   By: Marijo Sanes M.D.   On: 01/09/2019 08:27       Subjective: Feeling well.   No fever, back pain gone. Leg weakness resolved.  No confusion.  Discharge Exam: Vitals:   01/13/19 2040 01/14/19 0426  BP: (!) 153/74 (!) 153/66  Pulse: 83 81  Resp: 18 18  Temp: 99.5 F (37.5 C) 98.5 F (36.9 C)  SpO2: 95% 97%   Vitals:   01/13/19 0931 01/13/19 1614 01/13/19 2040 01/14/19 0426  BP: 137/69 (!) 158/80 (!) 153/74 (!) 153/66  Pulse: 78 77 83 81  Resp: 18 18 18 18   Temp: 98.4 F (36.9 C) 99.2 F (37.3 C) 99.5 F (37.5 C) 98.5 F (36.9 C)  TempSrc: Oral Oral Oral Oral  SpO2: 93% 97% 95% 97%  Weight:      Height:        General: Pt is alert, awake, not in acute distress Cardiovascular: RRR, nl S1-S2, no murmurs appreciated.   No LE edema.   Respiratory: Normal respiratory rate and rhythm.  CTAB without rales or wheezes. Abdominal: Abdomen soft and non-tender.  No distension or HSM.   Neuro/Psych: Strength symmetric in upper and lower extremities.  Judgment and insight appear normal.   The results of significant diagnostics from this hospitalization (including imaging, microbiology, ancillary and laboratory) are listed below for reference.     Microbiology: Recent Results (from the past 240 hour(s))  Blood culture (routine x 2)     Status: None   Collection Time: 01/09/19  8:17 AM   Specimen: BLOOD RIGHT HAND  Result Value Ref Range Status   Specimen Description BLOOD RIGHT HAND  Final   Special Requests   Final    BOTTLES DRAWN AEROBIC AND ANAEROBIC Blood Culture results may not be optimal due to an inadequate volume of blood received in culture bottles   Culture   Final    NO GROWTH 5 DAYS Performed at [facility], [place] [address], [place], [place] [address]    Report Status 01/14/2019 FINAL  Final  Blood culture (routine x 2)     Status: None    Collection Time: 01/09/19 10:15 AM   Specimen: BLOOD RIGHT HAND  Result Value Ref Range Status   Specimen Description BLOOD RIGHT HAND  Final   Special Requests   Final    BOTTLES DRAWN AEROBIC ONLY Blood Culture adequate volume   Culture   Final    NO GROWTH 5 DAYS Performed at [facility], [place] [address], [place], [place] [address]    Report Status 01/14/2019 FINAL  Final  SARS Coronavirus 2 by RT PCR (hospital order, performed in [facility] hospital lab) Nasopharyngeal Nasopharyngeal Swab     Status: None   Collection Time: 01/09/19 12:58 PM   Specimen: Nasopharyngeal Swab  Result Value Ref Range Status   SARS Coronavirus 2 NEGATIVE NEGATIVE Final    Comment: (NOTE) If result is NEGATIVE SARS-CoV-2 target nucleic acids are NOT DETECTED. The SARS-CoV-2 RNA is generally detectable in upper and lower  respiratory specimens during the acute phase of infection. The lowest  concentration of SARS-CoV-2 viral copies this assay can detect is 250  copies / mL. A negative result does not preclude SARS-CoV-2 infection  and should not be used as the sole basis for treatment or other  patient management decisions.  A negative result may occur with  improper specimen collection / handling, submission of specimen other  than nasopharyngeal swab, presence of viral mutation(s) within the  areas targeted by this assay, and inadequate number of viral copies  (<250 copies / mL). A negative result must be combined with clinical  observations, patient history, and epidemiological information. If result is POSITIVE SARS-CoV-2 target nucleic acids are DETECTED. The SARS-CoV-2 RNA is generally detectable in upper and lower  respiratory specimens dur ing the acute phase of infection.  Positive  results are indicative of active infection with SARS-CoV-2.  Clinical  correlation with patient history and other diagnostic information is  necessary to determine patient infection status.  Positive  results do  not rule out bacterial infection or co-infection with other viruses. If result is PRESUMPTIVE POSTIVE SARS-CoV-2 nucleic acids MAY BE PRESENT.   A presumptive positive result was obtained on the submitted specimen  and confirmed on repeat testing.  While 2019 novel coronavirus  (SARS-CoV-2) nucleic acids may be present in the submitted sample  additional confirmatory testing may be necessary for epidemiological  and / or clinical management purposes  to differentiate between  SARS-CoV-2 and other Sarbecovirus currently known to infect humans.  If clinically indicated additional testing with an alternate test  methodology [phone]) is advised. The SARS-CoV-2 RNA is generally  detectable in upper and lower respiratory sp ecimens during the acute  phase of infection. The expected result is Negative. Fact Sheet for Patients:  StrictlyIdeas.no Fact Sheet for Healthcare Providers: BankingDealers.co.za This test is not yet approved or cleared by the [place] FDA and has been authorized for detection and/or diagnosis of SARS-CoV-2 by FDA under an Emergency Use Authorization (EUA).  This EUA will remain in effect (meaning this test can be used) for the duration of the COVID-19 declaration under Section 564(b)(1) of the Act, 21 U.S.C. section 360bbb-3(b)(1), unless the authorization is terminated or revoked sooner. Performed at [facility], [place] [address], [place], [place] [address]   Urine Culture     Status: Abnormal   Collection Time: 01/10/19  1:42 PM   Specimen: Urine, Clean Catch  Result Value Ref Range Status   Specimen Description URINE, CLEAN CATCH  Final   Special Requests   Final    NONE Performed at [facility], [place] [address], [place], [place] [address]    Culture >=100,000 COLONIES/mL ENTEROCOCCUS FAECALIS (A)  Final   Report Status 01/12/2019 FINAL  Final   Organism ID, Bacteria ENTEROCOCCUS  FAECALIS (A)  Final      Susceptibility   Enterococcus faecalis - MIC*    AMPICILLIN <=2 SENSITIVE Sensitive     LEVOFLOXACIN 1 SENSITIVE Sensitive     NITROFURANTOIN <=  16 SENSITIVE Sensitive     VANCOMYCIN 1 SENSITIVE Sensitive     * >=100,000 COLONIES/mL ENTEROCOCCUS FAECALIS  Culture, blood (routine x 2)     Status: None (Preliminary result)   Collection Time: 01/13/19  9:03 AM   Specimen: BLOOD  Result Value Ref Range Status   Specimen Description BLOOD LEFT ANTECUBITAL  Final   Special Requests   Final    BOTTLES DRAWN AEROBIC AND ANAEROBIC Blood Culture adequate volume   Culture   Final    NO GROWTH < 24 HOURS Performed at [facility], [place] [address], [place], [place] [address]    Report Status PENDING  Incomplete  Culture, blood (routine x 2)     Status: None (Preliminary result)   Collection Time: 01/13/19  9:13 AM   Specimen: BLOOD  Result Value Ref Range Status   Specimen Description BLOOD RIGHT ANTECUBITAL  Final   Special Requests   Final    BOTTLES DRAWN AEROBIC AND ANAEROBIC Blood Culture adequate volume   Culture   Final    NO GROWTH < 24 HOURS Performed at [facility], [place] [address], [place], [place] [address]    Report Status PENDING  Incomplete     Labs: BNP (last 3 results) No results for input(s): BNP in the last 8760 hours. Basic Metabolic Panel: Recent Labs  Lab 01/09/19 0515 01/10/19 0548 01/11/19 0352 01/12/19 0527 01/13/19 0522  NA 132* 134* 137 137 141  K 4.0 3.4* 3.5 3.2* 3.4*  CL 96* 101 106 103 105  CO2 24 22 21* 23 24  GLUCOSE 214* 101* 123* 131* 147*  BUN 23 25* 19 13 11   CREATININE 1.87* 1.75* 1.30* 1.18* 1.17*  CALCIUM 8.8* 8.2* 7.8* 7.8* 8.2*   Liver Function Tests: Recent Labs  Lab 01/09/19 0515 01/11/19 0352 01/12/19 0527 01/13/19 0522  AST 25 59* 54* 35  ALT 26 45* 54* 49*  ALKPHOS 107 111 124 128*  BILITOT 1.0 0.3 0.5 0.3  PROT 6.8 5.3* 5.5* 5.8*  ALBUMIN 3.0* 2.0* 2.0* 2.1*   No results for  input(s): LIPASE, AMYLASE in the last 168 hours. No results for input(s): AMMONIA in the last 168 hours. CBC: Recent Labs  Lab 01/09/19 0515 01/10/19 0548 01/11/19 0352 01/12/19 0527 01/13/19 0522 01/14/19 0804  WBC 23.2* 15.9* 11.9* 11.4* 13.1* 14.5*  NEUTROABS 19.4*  --   --   --   --   --   HGB 13.4 11.2* 11.2* 10.8* 11.8* 11.9*  HCT 40.2 33.3* 32.6* 31.5* 34.3* 35.8*  MCV 87.4 85.4 85.6 84.9 84.7 86.5  PLT 340 303 294 363 440* 530*   Cardiac Enzymes: Recent Labs  Lab 01/12/19 0527  CKTOTAL 18*   BNP: Invalid input(s): POCBNP CBG: Recent Labs  Lab 01/13/19 0701 01/13/19 1136 01/13/19 1607 01/13/19 2039 01/14/19 0709  GLUCAP 156* 352* 154* 135* 124*   D-Dimer No results for input(s): DDIMER in the last 72 hours. Hgb A1c No results for input(s): HGBA1C in the last 72 hours. Lipid Profile No results for input(s): CHOL, HDL, LDLCALC, TRIG, CHOLHDL, LDLDIRECT in the last 72 hours. Thyroid function studies No results for input(s): TSH, T4TOTAL, T3FREE, THYROIDAB in the last 72 hours.  Invalid input(s): FREET3 Anemia work up No results for input(s): VITAMINB12, FOLATE, FERRITIN, TIBC, IRON, RETICCTPCT in the last 72 hours. Urinalysis    Component Value Date/Time   COLORURINE AMBER (A) 01/09/2019 1828   APPEARANCEUR CLOUDY (A) 01/09/2019 1828   LABSPEC 1.020  01/09/2019 1828   PHURINE 5.0 01/09/2019 1828   GLUCOSEU 50 (A) 01/09/2019 1828   HGBUR SMALL (A) 01/09/2019 1828   BILIRUBINUR NEGATIVE 01/09/2019 1828   KETONESUR NEGATIVE 01/09/2019 1828   PROTEINUR 100 (A) 01/09/2019 1828   NITRITE NEGATIVE 01/09/2019 1828   LEUKOCYTESUR MODERATE (A) 01/09/2019 1828   Sepsis Labs Invalid input(s): PROCALCITONIN,  WBC,  LACTICIDVEN Microbiology Recent Results (from the past 240 hour(s))  Blood culture (routine x 2)     Status: None   Collection Time: 01/09/19  8:17 AM   Specimen: BLOOD RIGHT HAND  Result Value Ref Range Status   Specimen Description BLOOD RIGHT  HAND  Final   Special Requests   Final    BOTTLES DRAWN AEROBIC AND ANAEROBIC Blood Culture results may not be optimal due to an inadequate volume of blood received in culture bottles   Culture   Final    NO GROWTH 5 DAYS Performed at [facility], [place] [address] [place], [place] [address]    Report Status 01/14/2019 FINAL  Final  Blood culture (routine x 2)     Status: None   Collection Time: 01/09/19 10:15 AM   Specimen: BLOOD RIGHT HAND  Result Value Ref Range Status   Specimen Description BLOOD RIGHT HAND  Final   Special Requests   Final    BOTTLES DRAWN AEROBIC ONLY Blood Culture adequate volume   Culture   Final    NO GROWTH 5 DAYS Performed at [facility], [place] [address] [place], [place] [address]    Report Status 01/14/2019 FINAL  Final  SARS Coronavirus 2 by RT PCR (hospital order, performed in [facility] hospital lab) Nasopharyngeal Nasopharyngeal Swab     Status: None   Collection Time: 01/09/19 12:58 PM   Specimen: Nasopharyngeal Swab  Result Value Ref Range Status   SARS Coronavirus 2 NEGATIVE NEGATIVE Final    Comment: (NOTE) If result is NEGATIVE SARS-CoV-2 target nucleic acids are NOT DETECTED. The SARS-CoV-2 RNA is generally detectable in upper and lower  respiratory specimens during the acute phase of infection. The lowest  concentration of SARS-CoV-2 viral copies this assay can detect is 250  copies / mL. A negative result does not preclude SARS-CoV-2 infection  and should not be used as the sole basis for treatment or other  patient management decisions.  A negative result may occur with  improper specimen collection / handling, submission of specimen other  than nasopharyngeal swab, presence of viral mutation(s) within the  areas targeted by this assay, and inadequate number of viral copies  (<250 copies / mL). A negative result must be combined with clinical  observations, patient history, and epidemiological information. If result is  POSITIVE SARS-CoV-2 target nucleic acids are DETECTED. The SARS-CoV-2 RNA is generally detectable in upper and lower  respiratory specimens dur ing the acute phase of infection.  Positive  results are indicative of active infection with SARS-CoV-2.  Clinical  correlation with patient history and other diagnostic information is  necessary to determine patient infection status.  Positive results do  not rule out bacterial infection or co-infection with other viruses. If result is PRESUMPTIVE POSTIVE SARS-CoV-2 nucleic acids MAY BE PRESENT.   A presumptive positive result was obtained on the submitted specimen  and confirmed on repeat testing.  While 2019 novel coronavirus  (SARS-CoV-2) nucleic acids may be present in the submitted sample  additional confirmatory testing may be necessary for epidemiological  and / or clinical management  purposes  to differentiate between  SARS-CoV-2 and other Sarbecovirus currently known to infect humans.  If clinically indicated additional testing with an alternate test  methodology [phone]) is advised. The SARS-CoV-2 RNA is generally  detectable in upper and lower respiratory sp ecimens during the acute  phase of infection. The expected result is Negative. Fact Sheet for Patients:  StrictlyIdeas.no Fact Sheet for Healthcare Providers: BankingDealers.co.za This test is not yet approved or cleared by the [place] FDA and has been authorized for detection and/or diagnosis of SARS-CoV-2 by FDA under an Emergency Use Authorization (EUA).  This EUA will remain in effect (meaning this test can be used) for the duration of the COVID-19 declaration under Section 564(b)(1) of the Act, 21 U.S.C. section 360bbb-3(b)(1), unless the authorization is terminated or revoked sooner. Performed at [facility], [place] [address], [place], [place] [address]   Urine Culture     Status: Abnormal   Collection Time:  01/10/19  1:42 PM   Specimen: Urine, Clean Catch  Result Value Ref Range Status   Specimen Description URINE, CLEAN CATCH  Final   Special Requests   Final    NONE Performed at [facility], [place] [address], [place], [place] [address]    Culture >=100,000 COLONIES/mL ENTEROCOCCUS FAECALIS (A)  Final   Report Status 01/12/2019 FINAL  Final   Organism ID, Bacteria ENTEROCOCCUS FAECALIS (A)  Final      Susceptibility   Enterococcus faecalis - MIC*    AMPICILLIN <=2 SENSITIVE Sensitive     LEVOFLOXACIN 1 SENSITIVE Sensitive     NITROFURANTOIN <=16 SENSITIVE Sensitive     VANCOMYCIN 1 SENSITIVE Sensitive     * >=100,000 COLONIES/mL ENTEROCOCCUS FAECALIS  Culture, blood (routine x 2)     Status: None (Preliminary result)   Collection Time: 01/13/19  9:03 AM   Specimen: BLOOD  Result Value Ref Range Status   Specimen Description BLOOD LEFT ANTECUBITAL  Final   Special Requests   Final    BOTTLES DRAWN AEROBIC AND ANAEROBIC Blood Culture adequate volume   Culture   Final    NO GROWTH < 24 HOURS Performed at [facility], [place] [address], [place], [place] [address]    Report Status PENDING  Incomplete  Culture, blood (routine x 2)     Status: None (Preliminary result)   Collection Time: 01/13/19  9:13 AM   Specimen: BLOOD  Result Value Ref Range Status   Specimen Description BLOOD RIGHT ANTECUBITAL  Final   Special Requests   Final    BOTTLES DRAWN AEROBIC AND ANAEROBIC Blood Culture adequate volume   Culture   Final    NO GROWTH < 24 HOURS Performed at [facility], [place] [address], [place], [place] [address]    Report Status PENDING  Incomplete     Time coordinating discharge: 35 minutes     SIGNED:   Edwin Dada, MD  [facility] 01/14/2019, 9:04 AM

## 2019-01-14 NOTE — Progress Notes (Signed)
DISCHARGE NOTE HOME KAHNIYA OVERBAY to be discharged Home per MD order. Discussed prescriptions and follow up appointments with the patient. Prescriptions given to patient; medication list explained in detail. Patient verbalized understanding.  Skin clean, dry and intact without evidence of skin break down, no evidence of skin tears noted. IV catheter discontinued intact. Site without signs and symptoms of complications. Dressing and pressure applied. Pt denies pain at the site currently. No complaints noted.  Patient free of lines, drains, and wounds.   An After Visit Summary (AVS) was printed and given to the patient. Patient escorted via wheelchair, and discharged home via private auto.  Arlyss Repress, RN

## 2019-01-14 NOTE — Plan of Care (Signed)
  Problem: Education: Goal: Knowledge of General Education information will improve Description: Including pain rating scale, medication(s)/side effects and non-pharmacologic comfort measures Outcome: Completed/Met   Problem: Health Behavior/Discharge Planning: Goal: Ability to manage health-related needs will improve Outcome: Completed/Met   Problem: Clinical Measurements: Goal: Ability to maintain clinical measurements within normal limits will improve Outcome: Completed/Met Goal: Diagnostic test results will improve Outcome: Completed/Met   Problem: Activity: Goal: Risk for activity intolerance will decrease Outcome: Completed/Met   Problem: Nutrition: Goal: Adequate nutrition will be maintained Outcome: Completed/Met   Problem: Elimination: Goal: Will not experience complications related to bowel motility Outcome: Completed/Met Goal: Will not experience complications related to urinary retention Outcome: Completed/Met   Problem: Skin Integrity: Goal: Risk for impaired skin integrity will decrease Outcome: Completed/Met   Problem: Urinary Elimination: Goal: Signs and symptoms of infection will decrease Outcome: Completed/Met

## 2019-01-18 LAB — CULTURE, BLOOD (ROUTINE X 2)
Culture: NO GROWTH
Culture: NO GROWTH
Special Requests: ADEQUATE
Special Requests: ADEQUATE

## 2019-01-22 DIAGNOSIS — I1310 Hypertensive heart and chronic kidney disease without heart failure, with stage 1 through stage 4 chronic kidney disease, or unspecified chronic kidney disease: Secondary | ICD-10-CM | POA: Insufficient documentation

## 2019-01-22 DIAGNOSIS — E871 Hypo-osmolality and hyponatremia: Secondary | ICD-10-CM | POA: Insufficient documentation

## 2019-03-11 ENCOUNTER — Ambulatory Visit: Payer: Managed Care, Other (non HMO) | Attending: Student | Admitting: Physical Therapy

## 2019-03-11 ENCOUNTER — Encounter: Payer: Self-pay | Admitting: Physical Therapy

## 2019-03-11 ENCOUNTER — Other Ambulatory Visit: Payer: Self-pay

## 2019-03-11 DIAGNOSIS — G8929 Other chronic pain: Secondary | ICD-10-CM | POA: Diagnosis present

## 2019-03-11 DIAGNOSIS — R293 Abnormal posture: Secondary | ICD-10-CM | POA: Diagnosis present

## 2019-03-11 DIAGNOSIS — M545 Low back pain, unspecified: Secondary | ICD-10-CM

## 2019-03-11 DIAGNOSIS — M6281 Muscle weakness (generalized): Secondary | ICD-10-CM | POA: Diagnosis present

## 2019-03-11 NOTE — Patient Instructions (Signed)
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
## 2019-03-19 ENCOUNTER — Encounter: Payer: Self-pay | Admitting: Physical Therapy

## 2019-03-19 ENCOUNTER — Ambulatory Visit: Payer: Managed Care, Other (non HMO) | Admitting: Physical Therapy

## 2019-03-19 ENCOUNTER — Other Ambulatory Visit: Payer: Self-pay

## 2019-03-19 DIAGNOSIS — G8929 Other chronic pain: Secondary | ICD-10-CM

## 2019-03-19 DIAGNOSIS — M6281 Muscle weakness (generalized): Secondary | ICD-10-CM

## 2019-03-19 DIAGNOSIS — R293 Abnormal posture: Secondary | ICD-10-CM

## 2019-03-19 DIAGNOSIS — M545 Low back pain: Secondary | ICD-10-CM | POA: Diagnosis not present

## 2019-03-19 NOTE — Patient Instructions (Signed)
Access Code: [email or web address]  URL: [email or web address]  Date: 03/19/2019  Prepared by: Sherol Dade    Exercises Sidelying Hip Abduction- 10 reps- 1 sets- 1x daily- 7x weekly  Hooklying Isometric Hip Flexion with Opposite Arm- 5 reps- 2 sets- 3 hold- 1x daily- 7x weekly  Shoulder extension with resistance - Neutral- 10 reps- 2-3 sets- 1x daily- 7x weekly    [place] [address] [place], [place] [address] Phone # [phone] Fax [phone]
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
## 2019-03-27 ENCOUNTER — Encounter: Payer: Managed Care, Other (non HMO) | Admitting: Physical Therapy

## 2019-03-27 ENCOUNTER — Ambulatory Visit: Payer: Managed Care, Other (non HMO) | Admitting: Physical Therapy

## 2019-04-02 ENCOUNTER — Ambulatory Visit: Payer: Managed Care, Other (non HMO) | Attending: Student | Admitting: Physical Therapy

## 2019-04-02 ENCOUNTER — Encounter: Payer: Self-pay | Admitting: Physical Therapy

## 2019-04-02 ENCOUNTER — Other Ambulatory Visit: Payer: Self-pay

## 2019-04-02 DIAGNOSIS — G8929 Other chronic pain: Secondary | ICD-10-CM | POA: Diagnosis present

## 2019-04-02 DIAGNOSIS — M6281 Muscle weakness (generalized): Secondary | ICD-10-CM | POA: Insufficient documentation

## 2019-04-02 DIAGNOSIS — M545 Low back pain, unspecified: Secondary | ICD-10-CM

## 2019-04-02 DIAGNOSIS — R293 Abnormal posture: Secondary | ICD-10-CM | POA: Diagnosis present

## 2019-04-02 NOTE — Patient Instructions (Signed)
Access Code: [email or web address]  URL: [email or web address]  Date: 04/02/2019  Prepared by: Sherol Dade    Exercises Standing Anti-Rotation Press with Anchored Resistance- 10-15 reps- 2 sets- 1x daily- 7x weekly  Shoulder extension with resistance - Neutral- 10 reps- 2-3 sets- 1x daily- 7x weekly  Hip Hiking on Step- 5 reps- 2-3 sets- 1x daily- 7x weekly  Standing Hip Abduction with Resistance at Ankles and Counter Support- 5 reps- 1-2 sets- 1x daily- 7x weekly    [facility] [address] [place], [place] [address] Phone # [phone] Fax [phone]
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
## 2019-04-03 ENCOUNTER — Encounter: Payer: Managed Care, Other (non HMO) | Admitting: Physical Therapy

## 2019-04-10 ENCOUNTER — Ambulatory Visit: Payer: Managed Care, Other (non HMO) | Admitting: Physical Therapy

## 2019-04-10 ENCOUNTER — Other Ambulatory Visit: Payer: Self-pay

## 2019-04-10 ENCOUNTER — Encounter: Payer: Self-pay | Admitting: Physical Therapy

## 2019-04-10 DIAGNOSIS — M545 Low back pain, unspecified: Secondary | ICD-10-CM

## 2019-04-10 DIAGNOSIS — G8929 Other chronic pain: Secondary | ICD-10-CM

## 2019-04-10 DIAGNOSIS — M6281 Muscle weakness (generalized): Secondary | ICD-10-CM

## 2019-04-10 DIAGNOSIS — R293 Abnormal posture: Secondary | ICD-10-CM
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
## 2019-04-17 ENCOUNTER — Encounter: Payer: Managed Care, Other (non HMO) | Admitting: Physical Therapy

## 2019-04-23 ENCOUNTER — Ambulatory Visit: Payer: Managed Care, Other (non HMO) | Attending: Student | Admitting: Physical Therapy

## 2019-04-23 ENCOUNTER — Encounter: Payer: Self-pay | Admitting: Physical Therapy

## 2019-04-23 ENCOUNTER — Other Ambulatory Visit: Payer: Self-pay

## 2019-04-23 DIAGNOSIS — G8929 Other chronic pain: Secondary | ICD-10-CM | POA: Insufficient documentation

## 2019-04-23 DIAGNOSIS — M6281 Muscle weakness (generalized): Secondary | ICD-10-CM | POA: Diagnosis present

## 2019-04-23 DIAGNOSIS — R293 Abnormal posture: Secondary | ICD-10-CM | POA: Insufficient documentation

## 2019-04-23 DIAGNOSIS — M545 Low back pain, unspecified: Secondary | ICD-10-CM

## 2019-04-23 NOTE — Patient Instructions (Signed)
Access Code: [email or web address]  URL: [email or web address]  Date: 04/23/2019  Prepared by: Sherol Dade    Exercises Seated Piriformis Stretch- 2 reps- 20 seconds hold- 1x daily- 7x weekly  Seated Piriformis Stretch- 2 reps- 20 seconds hold- 1x daily- 7x weekly    [facility] [address], [place], [place] [address] Phone # [phone] Fax [phone]
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
## 2019-04-30 ENCOUNTER — Encounter: Payer: Self-pay | Admitting: Physical Therapy

## 2019-04-30 ENCOUNTER — Ambulatory Visit: Payer: Managed Care, Other (non HMO) | Admitting: Physical Therapy

## 2019-04-30 ENCOUNTER — Other Ambulatory Visit: Payer: Self-pay

## 2019-04-30 DIAGNOSIS — M545 Low back pain, unspecified: Secondary | ICD-10-CM

## 2019-04-30 DIAGNOSIS — G8929 Other chronic pain: Secondary | ICD-10-CM

## 2019-04-30 DIAGNOSIS — R293 Abnormal posture: Secondary | ICD-10-CM

## 2019-04-30 DIAGNOSIS — M6281 Muscle weakness (generalized): Secondary | ICD-10-CM

## 2019-04-30 NOTE — Patient Instructions (Signed)
Access Code: [email or web address]  URL: [email or web address]  Date: 04/30/2019  Prepared by: Sherol Dade   Exercises  Hip Hiking on Step - 5 reps - 2-3 sets - 1x daily - 7x weekly  Standing Hip Abduction with Resistance at Ankles and Counter Support - 5 reps - 1-2 sets - 1x daily - 7x weekly  Sit to Stand with Hands on Knees - 5 reps - 2 sets - 1x daily - 7x weekly  Standing Trunk Rotation with Resistance - 10 reps - 3 sets - 1x daily - 7x weekly  Lateral Step Down - 10 reps - 3 sets - 1x daily - 7x weekly    [facility] [address] [place], [place] [address] Phone # [phone] Fax [phone]
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
## 2019-05-06 ENCOUNTER — Telehealth: Payer: Self-pay | Admitting: Physical Therapy

## 2019-05-06 NOTE — Telephone Encounter (Signed)
PT spoke with pt regarding the need to reschedule her appointment next week. She states she is doing well and was agreeable to see another therapist if needed.   [facility] [address], [place], [place] [address] Phone # [phone] Fax [phone]

## 2019-05-07 ENCOUNTER — Encounter: Payer: Managed Care, Other (non HMO) | Admitting: Physical Therapy

## 2019-05-11 ENCOUNTER — Ambulatory Visit: Payer: Managed Care, Other (non HMO) | Attending: Internal Medicine

## 2019-05-11 DIAGNOSIS — Z23 Encounter for immunization: Secondary | ICD-10-CM | POA: Insufficient documentation

## 2019-05-11 NOTE — Progress Notes (Signed)
   Covid-19 Vaccination Clinic  Name:  Anna [name]    MRN: [id number] DOB: 1950-12-24  05/11/2019  Anna [name] was observed post Covid-19 immunization for 15 minutes without incidence. She was provided with Vaccine Information Sheet and instruction to access the V-Safe system.   Anna [name] was instructed to call 911 with any severe reactions post vaccine: Marland Kitchen Difficulty breathing  . Swelling of your face and throat  . A fast heartbeat  . A bad rash all over your body  . Dizziness and weakness    Immunizations Administered    Name Date Dose VIS Date Route   Pfizer COVID-19 Vaccine 05/11/2019 12:12 PM 0.3 mL 02/28/2019 Intramuscular   Manufacturer: [place]   Lot: [id number]   [place]: [id number]

## 2019-05-14 ENCOUNTER — Encounter: Payer: Managed Care, Other (non HMO) | Admitting: Physical Therapy

## 2019-05-22 ENCOUNTER — Ambulatory Visit: Payer: Self-pay | Admitting: Physical Therapy

## 2019-06-04 ENCOUNTER — Ambulatory Visit: Payer: Managed Care, Other (non HMO) | Attending: Internal Medicine

## 2019-06-04 DIAGNOSIS — Z23 Encounter for immunization: Secondary | ICD-10-CM

## 2019-06-04 NOTE — Progress Notes (Signed)
   Covid-19 Vaccination Clinic  Name:  SHANNETTA SEITZINGER    MRN: [id number] DOB: 11/14/1950  06/04/2019  Ms. Tambasco was observed post Covid-19 immunization for 15 minutes without incident. She was provided with Vaccine Information Sheet and instruction to access the V-Safe system.   Ms. Liceaga was instructed to call 911 with any severe reactions post vaccine: Marland Kitchen Difficulty breathing  . Swelling of face and throat  . A fast heartbeat  . A bad rash all over body  . Dizziness and weakness   Immunizations Administered    Name Date Dose VIS Date Route   Pfizer COVID-19 Vaccine 06/04/2019 11:35 AM 0.3 mL 02/28/2019 Intramuscular   Manufacturer: [place]   Lot: [id number]   [place]: [id number]

## 2019-09-30 DIAGNOSIS — M7061 Trochanteric bursitis, right hip: Secondary | ICD-10-CM | POA: Diagnosis not present

## 2019-09-30 DIAGNOSIS — M5416 Radiculopathy, lumbar region: Secondary | ICD-10-CM | POA: Diagnosis not present

## 2019-10-06 ENCOUNTER — Other Ambulatory Visit: Payer: Self-pay | Admitting: Student

## 2019-10-06 DIAGNOSIS — M5416 Radiculopathy, lumbar region: Secondary | ICD-10-CM

## 2019-10-23 ENCOUNTER — Ambulatory Visit: Payer: Medicare Other | Attending: Student | Admitting: Physical Therapy

## 2019-10-23 ENCOUNTER — Encounter: Payer: Self-pay | Admitting: Physical Therapy

## 2019-10-23 ENCOUNTER — Other Ambulatory Visit: Payer: Self-pay

## 2019-10-23 DIAGNOSIS — M6281 Muscle weakness (generalized): Secondary | ICD-10-CM | POA: Diagnosis not present

## 2019-10-23 DIAGNOSIS — M5441 Lumbago with sciatica, right side: Secondary | ICD-10-CM | POA: Diagnosis not present
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
## 2019-10-28 ENCOUNTER — Ambulatory Visit: Payer: Medicare Other | Admitting: Physical Therapy

## 2019-10-28 ENCOUNTER — Encounter: Payer: Self-pay | Admitting: Physical Therapy

## 2019-10-28 ENCOUNTER — Other Ambulatory Visit: Payer: Self-pay

## 2019-10-28 DIAGNOSIS — M6281 Muscle weakness (generalized): Secondary | ICD-10-CM | POA: Diagnosis not present

## 2019-10-28 DIAGNOSIS — M5441 Lumbago with sciatica, right side: Secondary | ICD-10-CM
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
## 2019-11-01 ENCOUNTER — Ambulatory Visit
Admission: RE | Admit: 2019-11-01 | Discharge: 2019-11-01 | Disposition: A | Discharge status: Home / Self Care | Payer: Managed Care, Other (non HMO) | Source: Ambulatory Visit | Attending: Student | Admitting: Student

## 2019-11-01 DIAGNOSIS — M5126 Other intervertebral disc displacement, lumbar region: Secondary | ICD-10-CM | POA: Diagnosis not present

## 2019-11-01 DIAGNOSIS — M5127 Other intervertebral disc displacement, lumbosacral region: Secondary | ICD-10-CM | POA: Diagnosis not present

## 2019-11-01 DIAGNOSIS — M2578 Osteophyte, vertebrae: Secondary | ICD-10-CM | POA: Diagnosis not present

## 2019-11-01 DIAGNOSIS — M48061 Spinal stenosis, lumbar region without neurogenic claudication: Secondary | ICD-10-CM | POA: Diagnosis not present

## 2019-11-01 DIAGNOSIS — M5416 Radiculopathy, lumbar region: Secondary | ICD-10-CM

## 2019-11-01 MED ORDER — GADOBENATE DIMEGLUMINE 529 MG/ML IV SOLN
20.0000 mL | Freq: Once | INTRAVENOUS | Status: AC | PRN
  Administered 2019-11-01: 20 mL via INTRAVENOUS

## 2019-11-04 ENCOUNTER — Ambulatory Visit: Payer: Medicare Other | Admitting: Physical Therapy

## 2019-11-04 ENCOUNTER — Other Ambulatory Visit: Payer: Self-pay

## 2019-11-04 ENCOUNTER — Encounter: Payer: Self-pay | Admitting: Physical Therapy

## 2019-11-04 DIAGNOSIS — Z6827 Body mass index (BMI) 27.0-27.9, adult: Secondary | ICD-10-CM | POA: Diagnosis not present

## 2019-11-04 DIAGNOSIS — M5416 Radiculopathy, lumbar region: Secondary | ICD-10-CM | POA: Diagnosis not present

## 2019-11-04 DIAGNOSIS — M5441 Lumbago with sciatica, right side: Secondary | ICD-10-CM | POA: Diagnosis not present

## 2019-11-04 DIAGNOSIS — M6281 Muscle weakness (generalized): Secondary | ICD-10-CM
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
## 2019-11-11 ENCOUNTER — Ambulatory Visit: Payer: Medicare Other | Admitting: Physical Therapy

## 2019-11-17 DIAGNOSIS — M542 Cervicalgia: Secondary | ICD-10-CM | POA: Diagnosis not present

## 2019-11-17 DIAGNOSIS — M5416 Radiculopathy, lumbar region: Secondary | ICD-10-CM | POA: Diagnosis not present

## 2019-11-19 DIAGNOSIS — L821 Other seborrheic keratosis: Secondary | ICD-10-CM | POA: Diagnosis not present

## 2019-11-19 DIAGNOSIS — L820 Inflamed seborrheic keratosis: Secondary | ICD-10-CM | POA: Diagnosis not present

## 2019-11-19 DIAGNOSIS — L812 Freckles: Secondary | ICD-10-CM | POA: Diagnosis not present

## 2019-11-19 DIAGNOSIS — C44311 Basal cell carcinoma of skin of nose: Secondary | ICD-10-CM | POA: Diagnosis not present

## 2019-11-19 DIAGNOSIS — L720 Epidermal cyst: Secondary | ICD-10-CM | POA: Diagnosis not present

## 2019-12-02 DIAGNOSIS — M5126 Other intervertebral disc displacement, lumbar region: Secondary | ICD-10-CM | POA: Diagnosis not present

## 2019-12-02 DIAGNOSIS — M5416 Radiculopathy, lumbar region: Secondary | ICD-10-CM | POA: Diagnosis not present

## 2019-12-03 ENCOUNTER — Other Ambulatory Visit: Payer: Self-pay | Admitting: Neurological Surgery

## 2019-12-05 NOTE — Progress Notes (Signed)
Your procedure is scheduled on Wednesday, September 22nd.  Report to [facility] Main Entrance "A" at 8:15 A.M., and check in at the Admitting office.  Call this number if you have problems the morning of surgery:  [phone]  Call [phone] if you have any questions prior to your surgery date Monday-Friday 8am-4pm   Remember:  Do not eat or drink after midnight the night before your surgery   Take these medicines the morning of surgery with A SIP OF WATER  atorvastatin (LIPITOR)   If needed: oxyCODONE-acetaminophen (PERCOCET/ROXICET)  Follow your surgeon's instructions on when to stop Aspirin.  If no instructions were given by your surgeon then you will need to call the office to get those instructions.    As of today, STOP taking Aleve, Naproxen, Ibuprofen, Motrin, Advil, Goody's, BC's, all herbal medications, fish oil, and all vitamins.  WHAT DO I DO ABOUT MY DIABETES MEDICATION?  ---The morning of surgery--- Do NOT take metFORMIN (GLUCOPHAGE-XR)   HOW TO MANAGE YOUR DIABETES BEFORE AND AFTER SURGERY  Why is it important to control my blood sugar before and after surgery? . Improving blood sugar levels before and after surgery helps healing and can limit problems. . A way of improving blood sugar control is eating a healthy diet by: o  Eating less sugar and carbohydrates o  Increasing activity/exercise o  Talking with your doctor about reaching your blood sugar goals . High blood sugars (greater than 180 mg/dL) can raise your risk of infections and slow your recovery, so you will need to focus on controlling your diabetes during the weeks before surgery. . Make sure that the doctor who takes care of your diabetes knows about your planned surgery including the date and location.  How do I manage my blood sugar before surgery? . Check your blood sugar at least 4 times a day, starting 2 days before surgery, to make sure that the level is not too high or low. . Check your  blood sugar the morning of your surgery when you wake up and every 2 hours until you get to the Short Stay unit. o If your blood sugar is less than 70 mg/dL, you will need to treat for low blood sugar: - Do not take insulin. - Treat a low blood sugar (less than 70 mg/dL) with  cup of clear juice (cranberry or apple), 4 glucose tablets, OR glucose gel. - Recheck blood sugar in 15 minutes after treatment (to make sure it is greater than 70 mg/dL). If your blood sugar is not greater than 70 mg/dL on recheck, call [phone] for further instructions. . Report your blood sugar to the short stay nurse when you get to Short Stay.  . If you are admitted to the hospital after surgery: o Your blood sugar will be checked by the staff and you will probably be given insulin after surgery (instead of oral diabetes medicines) to make sure you have good blood sugar levels. o The goal for blood sugar control after surgery is 80-180 mg/dL.             Do not wear jewelry, make up, or nail polish            Do not wear lotions, powders, perfumes, or deodorant.            Do not shave 48 hours prior to surgery.              Do not bring valuables to the hospital.  [facility] is not responsible for any belongings or valuables.  Do NOT Smoke (Tobacco/Vaping) or drink Alcohol 24 hours prior to your procedure If you use a CPAP at night, you may bring all equipment for your overnight stay.   Contacts, glasses, dentures or bridgework may not be worn into surgery.      For patients admitted to the hospital, discharge time will be determined by your treatment team.   Patients discharged the day of surgery will not be allowed to drive home, and someone needs to stay with them for 24 hours.  Special instructions:   [place]- Preparing For Surgery  Before surgery, you can play an important role. Because skin is not sterile, your skin needs to be as free of germs as possible. You can reduce the number  of germs on your skin by washing with CHG (chlorahexidine gluconate) Soap before surgery.  CHG is an antiseptic cleaner which kills germs and bonds with the skin to continue killing germs even after washing.    Oral Hygiene is also important to reduce your risk of infection.  Remember - BRUSH YOUR TEETH THE MORNING OF SURGERY WITH YOUR REGULAR TOOTHPASTE  Please do not use if you have an allergy to CHG or antibacterial soaps. If your skin becomes reddened/irritated stop using the CHG.  Do not shave (including legs and underarms) for at least 48 hours prior to first CHG shower. It is OK to shave your face.  Please follow these instructions carefully.   1. Shower the NIGHT BEFORE SURGERY and the MORNING OF SURGERY with CHG Soap.   2. If you chose to wash your hair, wash your hair first as usual with your normal shampoo.  3. After you shampoo, rinse your hair and body thoroughly to remove the shampoo.  4. Use CHG as you would any other liquid soap. You can apply CHG directly to the skin and wash gently with a scrungie or a clean washcloth.   5. Apply the CHG Soap to your body ONLY FROM THE NECK DOWN.  Do not use on open wounds or open sores. Avoid contact with your eyes, ears, mouth and genitals (private parts). Wash Face and genitals (private parts)  with your normal soap.   6. Wash thoroughly, paying special attention to the area where your surgery will be performed.  7. Thoroughly rinse your body with warm water from the neck down.  8. DO NOT shower/wash with your normal soap after using and rinsing off the CHG Soap.  9. Pat yourself dry with a CLEAN TOWEL.  10. Wear CLEAN PAJAMAS to bed the night before surgery  11. Place CLEAN SHEETS on your bed the night of your first shower and DO NOT SLEEP WITH PETS.  Day of Surgery: Wear Clean/Comfortable clothing the morning of surgery Do not apply any deodorants/lotions.   Remember to brush your teeth WITH YOUR REGULAR TOOTHPASTE.   Please  read over the following fact sheets that you were given.

## 2019-12-08 ENCOUNTER — Encounter (HOSPITAL_COMMUNITY): Payer: Self-pay

## 2019-12-08 ENCOUNTER — Ambulatory Visit (HOSPITAL_COMMUNITY)
Admission: RE | Admit: 2019-12-08 | Discharge: 2019-12-08 | Disposition: A | Discharge status: Home / Self Care | Payer: Medicare Other | Source: Ambulatory Visit | Attending: Neurological Surgery | Admitting: Neurological Surgery

## 2019-12-08 ENCOUNTER — Other Ambulatory Visit (HOSPITAL_COMMUNITY)
Admission: RE | Admit: 2019-12-08 | Discharge: 2019-12-08 | Disposition: A | Discharge status: Home / Self Care | Payer: Medicare Other | Source: Ambulatory Visit | Attending: Neurological Surgery | Admitting: Neurological Surgery

## 2019-12-08 ENCOUNTER — Other Ambulatory Visit: Payer: Self-pay

## 2019-12-08 ENCOUNTER — Encounter (HOSPITAL_COMMUNITY)
Admission: RE | Admit: 2019-12-08 | Discharge: 2019-12-08 | Disposition: A | Discharge status: Home / Self Care | Payer: Medicare Other | Source: Ambulatory Visit | Attending: Neurological Surgery | Admitting: Neurological Surgery

## 2019-12-08 DIAGNOSIS — Z7984 Long term (current) use of oral hypoglycemic drugs: Secondary | ICD-10-CM | POA: Insufficient documentation

## 2019-12-08 DIAGNOSIS — Z7982 Long term (current) use of aspirin: Secondary | ICD-10-CM | POA: Diagnosis not present

## 2019-12-08 DIAGNOSIS — F17210 Nicotine dependence, cigarettes, uncomplicated: Secondary | ICD-10-CM | POA: Diagnosis not present

## 2019-12-08 DIAGNOSIS — Z01812 Encounter for preprocedural laboratory examination: Secondary | ICD-10-CM | POA: Insufficient documentation

## 2019-12-08 DIAGNOSIS — Z79899 Other long term (current) drug therapy: Secondary | ICD-10-CM | POA: Insufficient documentation

## 2019-12-08 DIAGNOSIS — Z7901 Long term (current) use of anticoagulants: Secondary | ICD-10-CM | POA: Diagnosis not present

## 2019-12-08 DIAGNOSIS — M5126 Other intervertebral disc displacement, lumbar region: Secondary | ICD-10-CM | POA: Diagnosis not present

## 2019-12-08 DIAGNOSIS — Z20822 Contact with and (suspected) exposure to covid-19: Secondary | ICD-10-CM | POA: Insufficient documentation

## 2019-12-08 DIAGNOSIS — E119 Type 2 diabetes mellitus without complications: Secondary | ICD-10-CM | POA: Diagnosis not present

## 2019-12-08 DIAGNOSIS — M797 Fibromyalgia: Secondary | ICD-10-CM | POA: Diagnosis not present

## 2019-12-08 DIAGNOSIS — Z01818 Encounter for other preprocedural examination: Secondary | ICD-10-CM | POA: Diagnosis not present

## 2019-12-08 DIAGNOSIS — I10 Essential (primary) hypertension: Secondary | ICD-10-CM | POA: Diagnosis not present

## 2019-12-08 HISTORY — DX: Personal history of urinary calculi: Z87.442

## 2019-12-08 LAB — CBC WITH DIFFERENTIAL/PLATELET
Abs Immature Granulocytes: 0.04 10*3/uL (ref 0.00–0.07)
Basophils Absolute: 0.1 10*3/uL (ref 0.0–0.1)
Basophils Relative: 1 %
Eosinophils Absolute: 0.1 10*3/uL (ref 0.0–0.5)
Eosinophils Relative: 1 %
HCT: 43.8 % (ref 36.0–46.0)
Hemoglobin: 13.9 g/dL (ref 12.0–15.0)
Immature Granulocytes: 0 %
Lymphocytes Relative: 20 %
Lymphs Abs: 2.3 10*3/uL (ref 0.7–4.0)
MCH: 29.9 pg (ref 26.0–34.0)
MCHC: 31.7 g/dL (ref 30.0–36.0)
MCV: 94.2 fL (ref 80.0–100.0)
Monocytes Absolute: 0.8 10*3/uL (ref 0.1–1.0)
Monocytes Relative: 7 %
Neutro Abs: 8.3 10*3/uL — ABNORMAL HIGH (ref 1.7–7.7)
Neutrophils Relative %: 71 %
Platelets: 527 10*3/uL — ABNORMAL HIGH (ref 150–400)
RBC: 4.65 MIL/uL (ref 3.87–5.11)
RDW: 13 % (ref 11.5–15.5)
WBC: 11.6 10*3/uL — ABNORMAL HIGH (ref 4.0–10.5)
nRBC: 0 % (ref 0.0–0.2)

## 2019-12-08 LAB — SARS CORONAVIRUS 2 (TAT 6-24 HRS): SARS Coronavirus 2: NEGATIVE

## 2019-12-08 LAB — BASIC METABOLIC PANEL
Anion gap: 10 (ref 5–15)
BUN: 16 mg/dL (ref 8–23)
CO2: 25 mmol/L (ref 22–32)
Calcium: 10.5 mg/dL — ABNORMAL HIGH (ref 8.9–10.3)
Chloride: 104 mmol/L (ref 98–111)
Creatinine, Ser: 1.11 mg/dL — ABNORMAL HIGH (ref 0.44–1.00)
GFR calc Af Amer: 58 mL/min — ABNORMAL LOW (ref >60)
GFR calc non Af Amer: 50 mL/min — ABNORMAL LOW (ref >60)
Glucose, Bld: 216 mg/dL — ABNORMAL HIGH (ref 70–99)
Potassium: 4 mmol/L (ref 3.5–5.1)
Sodium: 139 mmol/L (ref 135–145)

## 2019-12-08 LAB — HEMOGLOBIN A1C
Hgb A1c MFr Bld: 9.2 % — ABNORMAL HIGH (ref 4.8–5.6)
Mean Plasma Glucose: 217.34 mg/dL

## 2019-12-08 LAB — PROTIME-INR
INR: 1 (ref 0.8–1.2)
Prothrombin Time: 12.3 seconds (ref 11.4–15.2)

## 2019-12-08 LAB — GLUCOSE, CAPILLARY: Glucose-Capillary: 217 mg/dL — ABNORMAL HIGH (ref 70–99)

## 2019-12-08 LAB — SURGICAL PCR SCREEN
MRSA, PCR: NEGATIVE
Staphylococcus aureus: POSITIVE — AB

## 2019-12-08 NOTE — Progress Notes (Signed)
PCP - Leanna Battles Cardiologist - denies   Chest x-ray - 12/08/19 EKG - 01/09/19 Stress Test - denies ECHO - denies Cardiac Cath - denies  Fasting Blood Sugar - doenst check  Checks Blood Sugar ___0__ times a day  Aspirin Instructions: LD < a week ago doenst remember the exact day she stopped Patient has not started QSYMIA  COVID TEST- 12/08/19   Anesthesia review: NO  Patient denies shortness of breath, fever, cough and chest pain at PAT appointment   All instructions explained to the patient, with a verbal understanding of the material. Patient agrees to go over the instructions while at home for a better understanding. Patient also instructed to self quarantine after being tested for COVID-19. The opportunity to ask questions was provided.

## 2019-12-08 NOTE — Progress Notes (Signed)
Your procedure is scheduled on Wednesday, September 22nd.  Report to [facility] Main Entrance "A" at 8:15 A.M., and check in at the Admitting office.  Call this number if you have problems the morning of surgery:  [phone]  Call [phone] if you have any questions prior to your surgery date Monday-Friday 8am-4pm   Remember:  Do not eat or drink after midnight the night before your surgery   Take these medicines the morning of surgery with A SIP OF WATER  atorvastatin (LIPITOR)   If needed: oxyCODONE-acetaminophen (PERCOCET/ROXICET)  Follow your surgeon's instructions on when to stop Aspirin.  If no instructions were given by your surgeon then you will need to call the office to get those instructions.    If you have not already stopped taking QSYMIA, then please stop taking it as of today  As of today, STOP taking Aleve, Naproxen, Ibuprofen, Motrin, Advil, Goody's, BC's, all herbal medications, fish oil, and all vitamins.  WHAT DO I DO ABOUT MY DIABETES MEDICATION?  ---The morning of surgery--- Do NOT take metFORMIN (GLUCOPHAGE-XR)   HOW TO MANAGE YOUR DIABETES BEFORE AND AFTER SURGERY  Why is it important to control my blood sugar before and after surgery? . Improving blood sugar levels before and after surgery helps healing and can limit problems. . A way of improving blood sugar control is eating a healthy diet by: o  Eating less sugar and carbohydrates o  Increasing activity/exercise o  Talking with your doctor about reaching your blood sugar goals . High blood sugars (greater than 180 mg/dL) can raise your risk of infections and slow your recovery, so you will need to focus on controlling your diabetes during the weeks before surgery. . Make sure that the doctor who takes care of your diabetes knows about your planned surgery including the date and location.  How do I manage my blood sugar before surgery? . Check your blood sugar at least 4 times a day, starting 2  days before surgery, to make sure that the level is not too high or low. . Check your blood sugar the morning of your surgery when you wake up and every 2 hours until you get to the Short Stay unit. o If your blood sugar is less than 70 mg/dL, you will need to treat for low blood sugar: - Do not take insulin. - Treat a low blood sugar (less than 70 mg/dL) with  cup of clear juice (cranberry or apple), 4 glucose tablets, OR glucose gel. - Recheck blood sugar in 15 minutes after treatment (to make sure it is greater than 70 mg/dL). If your blood sugar is not greater than 70 mg/dL on recheck, call [phone] for further instructions. . Report your blood sugar to the short stay nurse when you get to Short Stay.  . If you are admitted to the hospital after surgery: o Your blood sugar will be checked by the staff and you will probably be given insulin after surgery (instead of oral diabetes medicines) to make sure you have good blood sugar levels. o The goal for blood sugar control after surgery is 80-180 mg/dL.             Do not wear jewelry, make up, or nail polish            Do not wear lotions, powders, perfumes, or deodorant.            Do not shave 48 hours prior to surgery.  Do not bring valuables to the hospital.            [facility] is not responsible for any belongings or valuables.  Do NOT Smoke (Tobacco/Vaping) or drink Alcohol 24 hours prior to your procedure If you use a CPAP at night, you may bring all equipment for your overnight stay.   Contacts, glasses, dentures or bridgework may not be worn into surgery.      For patients admitted to the hospital, discharge time will be determined by your treatment team.   Patients discharged the day of surgery will not be allowed to drive home, and someone needs to stay with them for 24 hours.  Special instructions:   [place]- Preparing For Surgery  Before surgery, you can play an important role. Because skin is not  sterile, your skin needs to be as free of germs as possible. You can reduce the number of germs on your skin by washing with CHG (chlorahexidine gluconate) Soap before surgery.  CHG is an antiseptic cleaner which kills germs and bonds with the skin to continue killing germs even after washing.    Oral Hygiene is also important to reduce your risk of infection.  Remember - BRUSH YOUR TEETH THE MORNING OF SURGERY WITH YOUR REGULAR TOOTHPASTE  Please do not use if you have an allergy to CHG or antibacterial soaps. If your skin becomes reddened/irritated stop using the CHG.  Do not shave (including legs and underarms) for at least 48 hours prior to first CHG shower. It is OK to shave your face.  Please follow these instructions carefully.   1. Shower the NIGHT BEFORE SURGERY and the MORNING OF SURGERY with CHG Soap.   2. If you chose to wash your hair, wash your hair first as usual with your normal shampoo.  3. After you shampoo, rinse your hair and body thoroughly to remove the shampoo.  4. Use CHG as you would any other liquid soap. You can apply CHG directly to the skin and wash gently with a scrungie or a clean washcloth.   5. Apply the CHG Soap to your body ONLY FROM THE NECK DOWN.  Do not use on open wounds or open sores. Avoid contact with your eyes, ears, mouth and genitals (private parts). Wash Face and genitals (private parts)  with your normal soap.   6. Wash thoroughly, paying special attention to the area where your surgery will be performed.  7. Thoroughly rinse your body with warm water from the neck down.  8. DO NOT shower/wash with your normal soap after using and rinsing off the CHG Soap.  9. Pat yourself dry with a CLEAN TOWEL.  10. Wear CLEAN PAJAMAS to bed the night before surgery  11. Place CLEAN SHEETS on your bed the night of your first shower and DO NOT SLEEP WITH PETS.  Day of Surgery: Wear Clean/Comfortable clothing the morning of surgery Do not apply any  deodorants/lotions.   Remember to brush your teeth WITH YOUR REGULAR TOOTHPASTE.   Please read over the following fact sheets that you were given.
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
## 2019-12-09 NOTE — Anesthesia Preprocedure Evaluation (Addendum)
Anesthesia Evaluation  Patient identified by MRN, date of birth, ID band Patient awake    Reviewed: Allergy & Precautions, NPO status , Patient's Chart, lab work & pertinent test results  History of Anesthesia Complications Negative for: history of anesthetic complications  Airway Mallampati: II  TM Distance: >3 FB Neck ROM: Full    Dental  (+) Dental Advisory Given, Teeth Intact   Pulmonary Current Smoker and Patient abstained from smoking.,    Pulmonary exam normal        Cardiovascular hypertension, Pt. on medications Normal cardiovascular exam     Neuro/Psych  Neuromuscular disease (RLS) negative psych ROS   GI/Hepatic negative GI ROS, Neg liver ROS,   Endo/Other  diabetes, Poorly Controlled, Type 2, Oral Hypoglycemic Agents  Renal/GU CRFRenal disease     Musculoskeletal  (+) Arthritis , Fibromyalgia -, narcotic dependent  Abdominal   Peds  Hematology negative hematology ROS (+)   Anesthesia Other Findings Covid test negative   Reproductive/Obstetrics                           Anesthesia Physical Anesthesia Plan  ASA: III  Anesthesia Plan: General   Post-op Pain Management:    Induction: Intravenous  PONV Risk Score and Plan: 3 and Treatment may vary due to age or medical condition, Ondansetron and Dexamethasone  Airway Management Planned: Oral ETT  Additional Equipment: None  Intra-op Plan:   Post-operative Plan: Extubation in OR  Informed Consent: I have reviewed the patients History and Physical, chart, labs and discussed the procedure including the risks, benefits and alternatives for the proposed anesthesia with the patient or authorized representative who has indicated his/her understanding and acceptance.     Dental advisory given  Plan Discussed with: CRNA and Anesthesiologist  Anesthesia Plan Comments: ( )      Anesthesia Quick Evaluation

## 2019-12-10 ENCOUNTER — Ambulatory Visit (HOSPITAL_COMMUNITY)
Admission: RE | Admit: 2019-12-10 | Discharge: 2019-12-11 | Disposition: A | Discharge status: Home / Self Care | Payer: Medicare Other | Attending: Neurological Surgery | Admitting: Neurological Surgery

## 2019-12-10 ENCOUNTER — Other Ambulatory Visit: Payer: Self-pay

## 2019-12-10 ENCOUNTER — Ambulatory Visit (HOSPITAL_COMMUNITY): Payer: Medicare Other | Admitting: Anesthesiology

## 2019-12-10 ENCOUNTER — Encounter (HOSPITAL_COMMUNITY): Payer: Self-pay | Admitting: Neurological Surgery

## 2019-12-10 ENCOUNTER — Ambulatory Visit (HOSPITAL_COMMUNITY): Payer: Medicare Other | Admitting: Vascular Surgery

## 2019-12-10 ENCOUNTER — Encounter (HOSPITAL_COMMUNITY)
Admission: RE | Disposition: A | Discharge status: Home / Self Care | Payer: Self-pay | Source: Home / Self Care | Attending: Neurological Surgery

## 2019-12-10 ENCOUNTER — Ambulatory Visit (HOSPITAL_COMMUNITY): Payer: Medicare Other

## 2019-12-10 DIAGNOSIS — Z9889 Other specified postprocedural states: Secondary | ICD-10-CM

## 2019-12-10 DIAGNOSIS — G2581 Restless legs syndrome: Secondary | ICD-10-CM | POA: Insufficient documentation

## 2019-12-10 DIAGNOSIS — Z79899 Other long term (current) drug therapy: Secondary | ICD-10-CM | POA: Insufficient documentation

## 2019-12-10 DIAGNOSIS — E1122 Type 2 diabetes mellitus with diabetic chronic kidney disease: Secondary | ICD-10-CM | POA: Diagnosis not present

## 2019-12-10 DIAGNOSIS — I129 Hypertensive chronic kidney disease with stage 1 through stage 4 chronic kidney disease, or unspecified chronic kidney disease: Secondary | ICD-10-CM | POA: Insufficient documentation

## 2019-12-10 DIAGNOSIS — Z833 Family history of diabetes mellitus: Secondary | ICD-10-CM | POA: Diagnosis not present

## 2019-12-10 DIAGNOSIS — Z888 Allergy status to other drugs, medicaments and biological substances status: Secondary | ICD-10-CM | POA: Diagnosis not present

## 2019-12-10 DIAGNOSIS — M797 Fibromyalgia: Secondary | ICD-10-CM | POA: Diagnosis not present

## 2019-12-10 DIAGNOSIS — Z8249 Family history of ischemic heart disease and other diseases of the circulatory system: Secondary | ICD-10-CM | POA: Diagnosis not present

## 2019-12-10 DIAGNOSIS — Z7984 Long term (current) use of oral hypoglycemic drugs: Secondary | ICD-10-CM | POA: Diagnosis not present

## 2019-12-10 DIAGNOSIS — N189 Chronic kidney disease, unspecified: Secondary | ICD-10-CM | POA: Insufficient documentation

## 2019-12-10 DIAGNOSIS — F17210 Nicotine dependence, cigarettes, uncomplicated: Secondary | ICD-10-CM | POA: Diagnosis not present

## 2019-12-10 DIAGNOSIS — F1120 Opioid dependence, uncomplicated: Secondary | ICD-10-CM | POA: Diagnosis not present

## 2019-12-10 DIAGNOSIS — M5126 Other intervertebral disc displacement, lumbar region: Secondary | ICD-10-CM | POA: Insufficient documentation

## 2019-12-10 DIAGNOSIS — Z7982 Long term (current) use of aspirin: Secondary | ICD-10-CM | POA: Diagnosis not present

## 2019-12-10 DIAGNOSIS — M1990 Unspecified osteoarthritis, unspecified site: Secondary | ICD-10-CM | POA: Insufficient documentation

## 2019-12-10 DIAGNOSIS — Z419 Encounter for procedure for purposes other than remedying health state, unspecified: Secondary | ICD-10-CM

## 2019-12-10 DIAGNOSIS — Z981 Arthrodesis status: Secondary | ICD-10-CM | POA: Diagnosis not present

## 2019-12-10 DIAGNOSIS — M48061 Spinal stenosis, lumbar region without neurogenic claudication: Secondary | ICD-10-CM | POA: Insufficient documentation

## 2019-12-10 HISTORY — PX: LUMBAR LAMINECTOMY/DECOMPRESSION MICRODISCECTOMY: SHX5026

## 2019-12-10 LAB — GLUCOSE, CAPILLARY
Glucose-Capillary: 180 mg/dL — ABNORMAL HIGH (ref 70–99)
Glucose-Capillary: 208 mg/dL — ABNORMAL HIGH (ref 70–99)
Glucose-Capillary: 218 mg/dL — ABNORMAL HIGH (ref 70–99)
Glucose-Capillary: 299 mg/dL — ABNORMAL HIGH (ref 70–99)

## 2019-12-10 SURGERY — LUMBAR LAMINECTOMY/DECOMPRESSION MICRODISCECTOMY 1 LEVEL
Anesthesia: General | Site: Spine Lumbar | Laterality: Right

## 2019-12-10 MED ORDER — FENTANYL CITRATE (PF) 250 MCG/5ML IJ SOLN
INTRAMUSCULAR | Status: DC | PRN
Start: 2019-12-10 — End: 2019-12-10
  Administered 2019-12-10: 100 ug via INTRAVENOUS
  Administered 2019-12-10: 50 ug via INTRAVENOUS

## 2019-12-10 MED ORDER — CEFAZOLIN SODIUM-DEXTROSE 2-4 GM/100ML-% IV SOLN
2.0000 g | INTRAVENOUS | Status: AC
  Administered 2019-12-10: 2 g via INTRAVENOUS
  Filled 2019-12-10: qty 100

## 2019-12-10 MED ORDER — METHOCARBAMOL 500 MG PO TABS
500.0000 mg | ORAL_TABLET | Freq: Four times a day (QID) | ORAL | Status: DC | PRN
  Administered 2019-12-10 – 2019-12-11 (×3): 500 mg via ORAL
  Filled 2019-12-10 (×2): qty 1

## 2019-12-10 MED ORDER — MIDAZOLAM HCL 2 MG/2ML IJ SOLN
INTRAMUSCULAR | Status: DC | PRN
  Administered 2019-12-10: 2 mg via INTRAVENOUS

## 2019-12-10 MED ORDER — METHOCARBAMOL 1000 MG/10ML IJ SOLN
500.0000 mg | Freq: Four times a day (QID) | INTRAVENOUS | Status: DC | PRN
  Filled 2019-12-10: qty 5

## 2019-12-10 MED ORDER — ROCURONIUM BROMIDE 10 MG/ML (PF) SYRINGE
PREFILLED_SYRINGE | INTRAVENOUS | Status: AC
  Filled 2019-12-10: qty 10

## 2019-12-10 MED ORDER — FENTANYL CITRATE (PF) 100 MCG/2ML IJ SOLN
INTRAMUSCULAR | Status: AC
  Administered 2019-12-10: 25 ug via INTRAVENOUS
  Filled 2019-12-10: qty 2

## 2019-12-10 MED ORDER — EPHEDRINE SULFATE-NACL 50-0.9 MG/10ML-% IV SOSY
PREFILLED_SYRINGE | INTRAVENOUS | Status: DC | PRN
  Administered 2019-12-10 (×2): 10 mg via INTRAVENOUS

## 2019-12-10 MED ORDER — DEXAMETHASONE SODIUM PHOSPHATE 10 MG/ML IJ SOLN
INTRAMUSCULAR | Status: AC
  Filled 2019-12-10: qty 1

## 2019-12-10 MED ORDER — SODIUM CHLORIDE 0.9 % IV SOLN: 250.0000 mL | INTRAVENOUS | Status: DC

## 2019-12-10 MED ORDER — BUPIVACAINE HCL (PF) 0.25 % IJ SOLN
INTRAMUSCULAR | Status: DC | PRN
  Administered 2019-12-10: 6 mL

## 2019-12-10 MED ORDER — 0.9 % SODIUM CHLORIDE (POUR BTL) OPTIME
TOPICAL | Status: DC | PRN
  Administered 2019-12-10: 1000 mL

## 2019-12-10 MED ORDER — CHLORHEXIDINE GLUCONATE 0.12 % MT SOLN
15.0000 mL | Freq: Once | OROMUCOSAL | Status: AC
  Administered 2019-12-10: 15 mL via OROMUCOSAL
  Filled 2019-12-10: qty 15

## 2019-12-10 MED ORDER — LIDOCAINE 2% (20 MG/ML) 5 ML SYRINGE
INTRAMUSCULAR | Status: DC | PRN
  Administered 2019-12-10: 60 mg via INTRAVENOUS

## 2019-12-10 MED ORDER — AMLODIPINE BESYLATE 5 MG PO TABS: 5.0000 mg | ORAL_TABLET | Freq: Every day | ORAL | Status: DC

## 2019-12-10 MED ORDER — METHOCARBAMOL 500 MG PO TABS
ORAL_TABLET | ORAL | Status: AC
  Filled 2019-12-10: qty 1

## 2019-12-10 MED ORDER — THROMBIN 5000 UNITS EX SOLR
CUTANEOUS | Status: DC | PRN
  Administered 2019-12-10 (×2): 5000 [IU] via TOPICAL

## 2019-12-10 MED ORDER — ACETAMINOPHEN 325 MG PO TABS: 650.0000 mg | ORAL_TABLET | ORAL | Status: DC | PRN

## 2019-12-10 MED ORDER — PHENYLEPHRINE 40 MCG/ML (10ML) SYRINGE FOR IV PUSH (FOR BLOOD PRESSURE SUPPORT)
PREFILLED_SYRINGE | INTRAVENOUS | Status: AC
  Filled 2019-12-10: qty 10

## 2019-12-10 MED ORDER — BUPIVACAINE HCL (PF) 0.25 % IJ SOLN
INTRAMUSCULAR | Status: AC
  Filled 2019-12-10: qty 30

## 2019-12-10 MED ORDER — MORPHINE SULFATE (PF) 2 MG/ML IV SOLN: 2.0000 mg | INTRAVENOUS | Status: DC | PRN

## 2019-12-10 MED ORDER — CHLORHEXIDINE GLUCONATE CLOTH 2 % EX PADS: 6.0000 | MEDICATED_PAD | Freq: Once | CUTANEOUS | Status: DC

## 2019-12-10 MED ORDER — AMLODIPINE BESYLATE 5 MG PO TABS
5.0000 mg | ORAL_TABLET | Freq: Every day | ORAL | Status: DC
  Administered 2019-12-10: 5 mg via ORAL
  Filled 2019-12-10: qty 1

## 2019-12-10 MED ORDER — AMLODIPINE-OLMESARTAN 5-20 MG PO TABS: 1.0000 | ORAL_TABLET | Freq: Every day | ORAL | Status: DC

## 2019-12-10 MED ORDER — PHENYLEPHRINE 40 MCG/ML (10ML) SYRINGE FOR IV PUSH (FOR BLOOD PRESSURE SUPPORT)
PREFILLED_SYRINGE | INTRAVENOUS | Status: DC | PRN
  Administered 2019-12-10: 20 ug via INTRAVENOUS
  Administered 2019-12-10 (×2): 40 ug via INTRAVENOUS

## 2019-12-10 MED ORDER — ONDANSETRON HCL 4 MG/2ML IJ SOLN
INTRAMUSCULAR | Status: DC | PRN
  Administered 2019-12-10: 4 mg via INTRAVENOUS

## 2019-12-10 MED ORDER — CEFAZOLIN SODIUM-DEXTROSE 2-4 GM/100ML-% IV SOLN
2.0000 g | Freq: Three times a day (TID) | INTRAVENOUS | Status: AC
  Administered 2019-12-10 – 2019-12-11 (×2): 2 g via INTRAVENOUS
  Filled 2019-12-10 (×2): qty 100

## 2019-12-10 MED ORDER — ROCURONIUM BROMIDE 10 MG/ML (PF) SYRINGE
PREFILLED_SYRINGE | INTRAVENOUS | Status: DC | PRN
  Administered 2019-12-10: 30 mg via INTRAVENOUS
  Administered 2019-12-10: 50 mg via INTRAVENOUS

## 2019-12-10 MED ORDER — HEMOSTATIC AGENTS (NO CHARGE) OPTIME
TOPICAL | Status: DC | PRN
  Administered 2019-12-10 (×2): 1 via TOPICAL

## 2019-12-10 MED ORDER — ONDANSETRON HCL 4 MG/2ML IJ SOLN: 4.0000 mg | Freq: Four times a day (QID) | INTRAMUSCULAR | Status: DC | PRN

## 2019-12-10 MED ORDER — LIDOCAINE 2% (20 MG/ML) 5 ML SYRINGE
INTRAMUSCULAR | Status: AC
  Filled 2019-12-10: qty 5

## 2019-12-10 MED ORDER — OXYCODONE HCL 5 MG PO TABS: 5.0000 mg | ORAL_TABLET | Freq: Once | ORAL | Status: AC | PRN

## 2019-12-10 MED ORDER — OXYCODONE-ACETAMINOPHEN 5-325 MG PO TABS
1.0000 | ORAL_TABLET | ORAL | Status: DC | PRN
  Administered 2019-12-10 – 2019-12-11 (×4): 1 via ORAL
  Filled 2019-12-10 (×4): qty 1

## 2019-12-10 MED ORDER — THROMBIN 5000 UNITS EX SOLR
CUTANEOUS | Status: AC
  Filled 2019-12-10: qty 15000

## 2019-12-10 MED ORDER — CELECOXIB 200 MG PO CAPS
200.0000 mg | ORAL_CAPSULE | Freq: Two times a day (BID) | ORAL | Status: DC
  Administered 2019-12-10 (×2): 200 mg via ORAL
  Filled 2019-12-10 (×2): qty 1

## 2019-12-10 MED ORDER — LACTATED RINGERS IV SOLN: INTRAVENOUS | Status: DC

## 2019-12-10 MED ORDER — THROMBIN 5000 UNITS EX SOLR: OROMUCOSAL | Status: DC | PRN

## 2019-12-10 MED ORDER — ACETAMINOPHEN 650 MG RE SUPP: 650.0000 mg | RECTAL | Status: DC | PRN

## 2019-12-10 MED ORDER — OXYCODONE HCL 5 MG PO TABS
ORAL_TABLET | ORAL | Status: AC
  Administered 2019-12-10: 5 mg via ORAL
  Filled 2019-12-10: qty 1

## 2019-12-10 MED ORDER — ASPIRIN EC 81 MG PO TBEC
81.0000 mg | DELAYED_RELEASE_TABLET | Freq: Every day | ORAL | Status: DC
  Administered 2019-12-10: 81 mg via ORAL
  Filled 2019-12-10: qty 1

## 2019-12-10 MED ORDER — ONDANSETRON HCL 4 MG/2ML IJ SOLN
INTRAMUSCULAR | Status: AC
  Filled 2019-12-10: qty 2

## 2019-12-10 MED ORDER — ONDANSETRON HCL 4 MG PO TABS: 4.0000 mg | ORAL_TABLET | Freq: Four times a day (QID) | ORAL | Status: DC | PRN

## 2019-12-10 MED ORDER — EPHEDRINE 5 MG/ML INJ
INTRAVENOUS | Status: AC
  Filled 2019-12-10: qty 10

## 2019-12-10 MED ORDER — DEXAMETHASONE SODIUM PHOSPHATE 10 MG/ML IJ SOLN
10.0000 mg | Freq: Once | INTRAMUSCULAR | Status: AC
  Administered 2019-12-10: 10 mg via INTRAVENOUS
  Filled 2019-12-10: qty 1

## 2019-12-10 MED ORDER — SODIUM CHLORIDE 0.9% FLUSH
3.0000 mL | Freq: Two times a day (BID) | INTRAVENOUS | Status: DC
  Administered 2019-12-10: 3 mL via INTRAVENOUS

## 2019-12-10 MED ORDER — ONDANSETRON HCL 4 MG/2ML IJ SOLN: 4.0000 mg | Freq: Once | INTRAMUSCULAR | Status: DC | PRN

## 2019-12-10 MED ORDER — MENTHOL 3 MG MT LOZG: 1.0000 | LOZENGE | OROMUCOSAL | Status: DC | PRN

## 2019-12-10 MED ORDER — LIDOCAINE 2% (20 MG/ML) 5 ML SYRINGE
INTRAMUSCULAR | Status: AC
  Filled 2019-12-10: qty 10

## 2019-12-10 MED ORDER — ORAL CARE MOUTH RINSE: 15.0000 mL | Freq: Once | OROMUCOSAL | Status: AC

## 2019-12-10 MED ORDER — GLYCOPYRROLATE PF 0.2 MG/ML IJ SOSY
PREFILLED_SYRINGE | INTRAMUSCULAR | Status: DC | PRN
  Administered 2019-12-10: .2 mg via INTRAVENOUS

## 2019-12-10 MED ORDER — INSULIN ASPART 100 UNIT/ML ~~LOC~~ SOLN
0.0000 [IU] | Freq: Three times a day (TID) | SUBCUTANEOUS | Status: DC
  Administered 2019-12-11: 3 [IU] via SUBCUTANEOUS

## 2019-12-10 MED ORDER — SODIUM CHLORIDE 0.9% FLUSH: 3.0000 mL | INTRAVENOUS | Status: DC | PRN

## 2019-12-10 MED ORDER — INSULIN ASPART 100 UNIT/ML ~~LOC~~ SOLN
0.0000 [IU] | Freq: Every day | SUBCUTANEOUS | Status: DC
  Administered 2019-12-10: 3 [IU] via SUBCUTANEOUS

## 2019-12-10 MED ORDER — IRBESARTAN 150 MG PO TABS
150.0000 mg | ORAL_TABLET | Freq: Every day | ORAL | Status: DC
  Administered 2019-12-10: 150 mg via ORAL
  Filled 2019-12-10: qty 1

## 2019-12-10 MED ORDER — SUGAMMADEX SODIUM 200 MG/2ML IV SOLN
INTRAVENOUS | Status: DC | PRN
  Administered 2019-12-10: 200 mg via INTRAVENOUS

## 2019-12-10 MED ORDER — INSULIN ASPART 100 UNIT/ML ~~LOC~~ SOLN
0.0000 [IU] | Freq: Three times a day (TID) | SUBCUTANEOUS | Status: DC
  Administered 2019-12-10: 5 [IU] via SUBCUTANEOUS

## 2019-12-10 MED ORDER — GLYCOPYRROLATE PF 0.2 MG/ML IJ SOSY
PREFILLED_SYRINGE | INTRAMUSCULAR | Status: AC
  Filled 2019-12-10: qty 1

## 2019-12-10 MED ORDER — PROPOFOL 10 MG/ML IV BOLUS
INTRAVENOUS | Status: DC | PRN
  Administered 2019-12-10: 140 mg via INTRAVENOUS

## 2019-12-10 MED ORDER — MIDAZOLAM HCL 2 MG/2ML IJ SOLN
INTRAMUSCULAR | Status: AC
  Filled 2019-12-10: qty 2

## 2019-12-10 MED ORDER — PHENOL 1.4 % MT LIQD: 1.0000 | OROMUCOSAL | Status: DC | PRN

## 2019-12-10 MED ORDER — METFORMIN HCL ER 500 MG PO TB24
500.0000 mg | ORAL_TABLET | Freq: Every day | ORAL | Status: DC
  Administered 2019-12-10: 500 mg via ORAL
  Filled 2019-12-10: qty 1

## 2019-12-10 MED ORDER — SENNA 8.6 MG PO TABS
1.0000 | ORAL_TABLET | Freq: Two times a day (BID) | ORAL | Status: DC
  Administered 2019-12-10 (×2): 8.6 mg via ORAL
  Filled 2019-12-10 (×2): qty 1

## 2019-12-10 MED ORDER — FENTANYL CITRATE (PF) 100 MCG/2ML IJ SOLN: 25.0000 ug | INTRAMUSCULAR | Status: DC | PRN

## 2019-12-10 MED ORDER — FENTANYL CITRATE (PF) 250 MCG/5ML IJ SOLN
INTRAMUSCULAR | Status: AC
  Filled 2019-12-10: qty 5

## 2019-12-10 MED ORDER — IRBESARTAN 150 MG PO TABS: 75.0000 mg | ORAL_TABLET | Freq: Every day | ORAL | Status: DC

## 2019-12-10 MED ORDER — OXYCODONE HCL 5 MG/5ML PO SOLN: 5.0000 mg | Freq: Once | ORAL | Status: AC | PRN

## 2019-12-10 MED ORDER — PHENYLEPHRINE HCL-NACL 10-0.9 MG/250ML-% IV SOLN
INTRAVENOUS | Status: DC | PRN
  Administered 2019-12-10: 20 ug/min via INTRAVENOUS

## 2019-12-10 MED ORDER — POTASSIUM CHLORIDE IN NACL 20-0.9 MEQ/L-% IV SOLN: INTRAVENOUS | Status: DC

## 2019-12-10 SURGICAL SUPPLY — 54 items
ADH SKN CLS APL DERMABOND .7 (GAUZE/BANDAGES/DRESSINGS) ×1
APL SKNCLS STERI-STRIP NONHPOA (GAUZE/BANDAGES/DRESSINGS) ×1
BAG DECANTER FOR FLEXI CONT (MISCELLANEOUS) ×2 IMPLANT
BAND INSRT 18 STRL LF DISP RB (MISCELLANEOUS) ×2
BAND RUBBER #18 3X1/16 STRL (MISCELLANEOUS) ×4 IMPLANT
BENZOIN TINCTURE PRP APPL 2/3 (GAUZE/BANDAGES/DRESSINGS) ×2 IMPLANT
BUR CARBIDE MATCH 3.0 (BURR) ×2 IMPLANT
CANISTER SUCT 3000ML PPV (MISCELLANEOUS) ×2 IMPLANT
DERMABOND ADVANCED (GAUZE/BANDAGES/DRESSINGS) ×1
DERMABOND ADVANCED .7 DNX12 (GAUZE/BANDAGES/DRESSINGS) IMPLANT
DIFFUSER DRILL AIR PNEUMATIC (MISCELLANEOUS) ×2 IMPLANT
DRAPE LAPAROTOMY 100X72X124 (DRAPES) ×2 IMPLANT
DRAPE MICROSCOPE LEICA (MISCELLANEOUS) ×2 IMPLANT
DRAPE SURG 17X23 STRL (DRAPES) ×2 IMPLANT
DRSG OPSITE POSTOP 4X6 (GAUZE/BANDAGES/DRESSINGS) ×1 IMPLANT
DURAPREP 26ML APPLICATOR (WOUND CARE) ×2 IMPLANT
ELECT REM PT RETURN 9FT ADLT (ELECTROSURGICAL) ×2
ELECTRODE REM PT RTRN 9FT ADLT (ELECTROSURGICAL) ×1 IMPLANT
GAUZE 4X4 16PLY RFD (DISPOSABLE) IMPLANT
GLOVE BIO SURGEON STRL SZ 6.5 (GLOVE) ×4 IMPLANT
GLOVE BIO SURGEON STRL SZ7 (GLOVE) IMPLANT
GLOVE BIO SURGEON STRL SZ8 (GLOVE) ×2 IMPLANT
GLOVE BIOGEL PI IND STRL 6.5 (GLOVE) IMPLANT
GLOVE BIOGEL PI IND STRL 7.0 (GLOVE) IMPLANT
GLOVE BIOGEL PI IND STRL 7.5 (GLOVE) IMPLANT
GLOVE BIOGEL PI INDICATOR 6.5 (GLOVE) ×3
GLOVE BIOGEL PI INDICATOR 7.0 (GLOVE)
GLOVE BIOGEL PI INDICATOR 7.5 (GLOVE) ×3
GLOVE SS BIOGEL STRL SZ 7 (GLOVE) IMPLANT
GLOVE SUPERSENSE BIOGEL SZ 7 (GLOVE) ×2
GOWN STRL REUS W/ TWL LRG LVL3 (GOWN DISPOSABLE) IMPLANT
GOWN STRL REUS W/ TWL XL LVL3 (GOWN DISPOSABLE) ×1 IMPLANT
GOWN STRL REUS W/TWL 2XL LVL3 (GOWN DISPOSABLE) IMPLANT
GOWN STRL REUS W/TWL LRG LVL3 (GOWN DISPOSABLE)
GOWN STRL REUS W/TWL XL LVL3 (GOWN DISPOSABLE) ×2
KIT BASIN OR (CUSTOM PROCEDURE TRAY) ×2 IMPLANT
KIT TURNOVER KIT B (KITS) ×2 IMPLANT
NDL HYPO 25X1 1.5 SAFETY (NEEDLE) ×1 IMPLANT
NDL SPNL 20GX3.5 QUINCKE YW (NEEDLE) IMPLANT
NEEDLE HYPO 25X1 1.5 SAFETY (NEEDLE) ×2 IMPLANT
NEEDLE SPNL 20GX3.5 QUINCKE YW (NEEDLE) ×2 IMPLANT
NS IRRIG 1000ML POUR BTL (IV SOLUTION) ×2 IMPLANT
PACK LAMINECTOMY NEURO (CUSTOM PROCEDURE TRAY) ×2 IMPLANT
SEALANT ADHERUS EXTEND TIP (MISCELLANEOUS) ×1 IMPLANT
SPONGE SURGIFOAM ABS GEL SZ50 (HEMOSTASIS) ×1 IMPLANT
STRIP CLOSURE SKIN 1/2X4 (GAUZE/BANDAGES/DRESSINGS) ×2 IMPLANT
SUT PROLENE 6 0 BV (SUTURE) ×2 IMPLANT
SUT VIC AB 0 CT1 18XCR BRD8 (SUTURE) ×1 IMPLANT
SUT VIC AB 0 CT1 8-18 (SUTURE) ×2
SUT VIC AB 2-0 CP2 18 (SUTURE) ×2 IMPLANT
SUT VIC AB 3-0 SH 8-18 (SUTURE) ×2 IMPLANT
TOWEL GREEN STERILE (TOWEL DISPOSABLE) ×2 IMPLANT
TOWEL GREEN STERILE FF (TOWEL DISPOSABLE) ×2 IMPLANT
WATER STERILE IRR 1000ML POUR (IV SOLUTION) ×2 IMPLANT

## 2019-12-10 NOTE — Anesthesia Postprocedure Evaluation (Signed)
Anesthesia Post Note  Patient: Anna [name]  Procedure(s) Performed: Right Lumbar Three-Four Microdiscectomy (Right Spine Lumbar)     Patient location during evaluation: PACU Anesthesia Type: General Level of consciousness: awake and alert Pain management: pain level controlled Vital Signs Assessment: post-procedure vital signs reviewed and stable Respiratory status: spontaneous breathing, nonlabored ventilation and respiratory function stable Cardiovascular status: blood pressure returned to baseline and stable Postop Assessment: no apparent nausea or vomiting Anesthetic complications: no   No complications documented.  Last Vitals:  Vitals:   12/10/19 1350 12/10/19 1415  BP: (!) 145/67 (!) 153/86  Pulse: 84 85  Resp: 15 18  Temp:  (!) 36.1 C  SpO2: 93% 98%    Last Pain:  Vitals:   12/10/19 1415  TempSrc:   PainSc: [place]

## 2019-12-10 NOTE — Transfer of Care (Signed)
Immediate Anesthesia Transfer of Care Note  Patient: Anna [name]  Procedure(s) Performed: Right Lumbar Three-Four Microdiscectomy (Right Spine Lumbar)  Patient Location: PACU  Anesthesia Type:General  Level of Consciousness: awake, alert  and oriented  Airway & Oxygen Therapy: Patient Spontanous Breathing and Patient connected to face mask oxygen  Post-op Assessment: Report given to RN and Post -op Vital signs reviewed and stable  Post vital signs: Reviewed and stable  Last Vitals:  Vitals Value Taken Time  BP 152/71 12/10/19 1233  Temp    Pulse 93 12/10/19 1233  Resp 15 12/10/19 1233  SpO2 100 % 12/10/19 1233  Vitals shown include unvalidated device data.  Last Pain:  Vitals:   12/10/19 0925  TempSrc:   PainSc: 0-No pain      Patients Stated Pain Goal: 3 (76/22/63 3354)  Complications: No complications documented.

## 2019-12-10 NOTE — Anesthesia Procedure Notes (Signed)
Procedure Name: Intubation Date/Time: 12/10/2019 10:51 AM Performed by: Teressa Lower., CRNA Pre-anesthesia Checklist: Patient identified, Emergency Drugs available, Suction available and Patient being monitored Patient Re-evaluated:Patient Re-evaluated prior to induction Oxygen Delivery Method: Circle system utilized Preoxygenation: Pre-oxygenation with 100% oxygen Induction Type: IV induction and Cricoid Pressure applied Ventilation: Mask ventilation without difficulty Laryngoscope Size: Mac and 3 Grade View: Grade II Tube type: Oral Tube size: 7.0 mm Number of attempts: 1 Airway Equipment and Method: Stylet Placement Confirmation: ETT inserted through vocal cords under direct vision,  positive ETCO2 and breath sounds checked- equal and bilateral Secured at: 21 cm Tube secured with: Tape Dental Injury: Teeth and Oropharynx as per pre-operative assessment

## 2019-12-10 NOTE — Op Note (Signed)
12/10/2019  12:30 PM  PATIENT:  Anna [name]  69 y.o. 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 Imaging showed large right disc herniation at L3-4 where she had had a previous decompressive hemilaminectomy for stenosis. The patient tried conservative measures without relief. Pain was debilitating. Recommended redo microdiscectomy. Patient understood the risks, benefits, and alternatives and potential outcomes and wished to proceed.  PROCEDURE DETAILS: The patient was taken to the operating room and after induction of adequate generalized endotracheal anesthesia, the patient was rolled into the prone position on the Wilson frame and all pressure points were padded. The lumbar region was cleaned and then prepped with DuraPrep and draped in the usual sterile fashion. 5 cc of local anesthesia was injected and then a dorsal midline incision was made and carried down to the lumbo sacral fascia. The fascia was opened and the paraspinous musculature was taken down in a subperiosteal fashion to expose L3 4 on the right. Intraoperative x-ray confirmed my level, and I dissected the scar away from the previous laminotomy edges.  then I used a combination of the high-speed drill and the Kerrison punches to perform a redo hemilaminectomy, medial facetectomy, and foraminotomy at L3 4 on the right. The underlying yellow ligament  was opened and removed in a piecemeal fashion to expose the underlying dura and exiting nerve root. I undercut the lateral recess and dissected down until I was medial to and distal to the pedicle. The nerve root was well decompressed. We then gently retracted the nerve root medially with a retractor, coagulated the epidural venous vasculature, and found a large disc herniation with free fragments.  We were able to remove several fragments with a nerve hook and pituitary rongeur.  Incised the disc space. I performed a thorough intradiscal discectomy with pituitary rongeurs and curettes, until I had a nice decompression of the nerve root and the midline. I then palpated with a coronary dilator along the nerve root and into the foramen to assure adequate decompression. I felt no more compression of the nerve root.  We found a very tiny unintended durotomy distally near the L4 nerve root ventrolaterally.  We were able to get this repaired with a single 6-0 Prolene suture.  Checked for any remaining leakage with a Valsalva.  I irrigated with saline solution containing bacitracin. Achieved hemostasis with bipolar cautery, lined the dura with Gelfoam and Tisseel fibrin glue, and then closed the fascia with 0 Vicryl. I closed the subcutaneous tissues with 2-0 Vicryl and the subcuticular tissues with 3-0 Vicryl. The skin was then closed with benzoin and Steri-Strips. The drapes were removed, a sterile dressing was applied.  My nurse practitioner was involved in the exposure, safe retraction of the neural elements, the disc work and the closure. the patient was awakened from general anesthesia and transferred to the recovery room in stable condition. At the end of the procedure all sponge, needle and instrument counts were correct.    PLAN OF CARE: Admit for overnight  observation  PATIENT DISPOSITION:  PACU - hemodynamically stable.   Delay start of Pharmacological VTE agent (>24hrs) due to surgical blood loss or  risk of bleeding:  yes
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
## 2019-12-11 ENCOUNTER — Encounter (HOSPITAL_COMMUNITY): Payer: Self-pay | Admitting: Neurological Surgery

## 2019-12-11 DIAGNOSIS — Z7982 Long term (current) use of aspirin: Secondary | ICD-10-CM | POA: Diagnosis not present

## 2019-12-11 DIAGNOSIS — M797 Fibromyalgia: Secondary | ICD-10-CM | POA: Diagnosis not present

## 2019-12-11 DIAGNOSIS — M1990 Unspecified osteoarthritis, unspecified site: Secondary | ICD-10-CM | POA: Diagnosis not present

## 2019-12-11 DIAGNOSIS — I129 Hypertensive chronic kidney disease with stage 1 through stage 4 chronic kidney disease, or unspecified chronic kidney disease: Secondary | ICD-10-CM | POA: Diagnosis not present

## 2019-12-11 DIAGNOSIS — Z79899 Other long term (current) drug therapy: Secondary | ICD-10-CM | POA: Diagnosis not present

## 2019-12-11 DIAGNOSIS — M48061 Spinal stenosis, lumbar region without neurogenic claudication: Secondary | ICD-10-CM | POA: Diagnosis not present

## 2019-12-11 DIAGNOSIS — F1120 Opioid dependence, uncomplicated: Secondary | ICD-10-CM | POA: Diagnosis not present

## 2019-12-11 DIAGNOSIS — Z833 Family history of diabetes mellitus: Secondary | ICD-10-CM | POA: Diagnosis not present

## 2019-12-11 DIAGNOSIS — Z888 Allergy status to other drugs, medicaments and biological substances status: Secondary | ICD-10-CM | POA: Diagnosis not present

## 2019-12-11 DIAGNOSIS — F17210 Nicotine dependence, cigarettes, uncomplicated: Secondary | ICD-10-CM | POA: Diagnosis not present

## 2019-12-11 DIAGNOSIS — N189 Chronic kidney disease, unspecified: Secondary | ICD-10-CM | POA: Diagnosis not present

## 2019-12-11 DIAGNOSIS — Z7984 Long term (current) use of oral hypoglycemic drugs: Secondary | ICD-10-CM | POA: Diagnosis not present

## 2019-12-11 DIAGNOSIS — E1122 Type 2 diabetes mellitus with diabetic chronic kidney disease: Secondary | ICD-10-CM | POA: Diagnosis not present

## 2019-12-11 DIAGNOSIS — Z8249 Family history of ischemic heart disease and other diseases of the circulatory system: Secondary | ICD-10-CM | POA: Diagnosis not present

## 2019-12-11 DIAGNOSIS — G2581 Restless legs syndrome: Secondary | ICD-10-CM | POA: Diagnosis not present

## 2019-12-11 DIAGNOSIS — M5126 Other intervertebral disc displacement, lumbar region: Secondary | ICD-10-CM | POA: Diagnosis not present

## 2019-12-11 LAB — GLUCOSE, CAPILLARY: Glucose-Capillary: 189 mg/dL — ABNORMAL HIGH (ref 70–99)

## 2019-12-11 MED ORDER — CELECOXIB 200 MG PO CAPS: 200.0000 mg | ORAL_CAPSULE | Freq: Two times a day (BID) | ORAL | 2 refills | Status: DC | PRN

## 2019-12-11 MED ORDER — OXYCODONE-ACETAMINOPHEN 5-325 MG PO TABS
1.0000 | ORAL_TABLET | Freq: Four times a day (QID) | ORAL | 0 refills | Status: DC | PRN
Start: 2019-12-11 — End: 2020-01-16

## 2019-12-11 NOTE — Discharge Summary (Signed)
Physician Discharge Summary  Patient ID: Anna [name] MRN: [id number] DOB/AGE: March 10, 1951 69 y.o.  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 The patient tolerated the procedure well and was taken to the recovery room and then to the floor in stable condition. The hospital course was routine. There were no complications. The wound remained clean dry and intact. Pt had appropriate back soreness. No complaints of leg pain or new N/T/W. The patient remained afebrile with stable vital signs, and tolerated a regular diet. The patient continued to increase activities, and pain was well controlled with oral pain medications.   Consults: None  Significant Diagnostic Studies:  Results for orders placed or performed during the hospital encounter of 12/10/19  Glucose, capillary  Result Value Ref Range   Glucose-Capillary 180 (H) 70 - 99 mg/dL   Comment 1 Notify RN    Comment 2 Document in Chart   Glucose, capillary  Result Value Ref Range   Glucose-Capillary 208 (H) 70 - 99 mg/dL   Comment 1 Notify RN    Comment 2 Document in Chart   Glucose, capillary  Result Value Ref Range   Glucose-Capillary 218 (H) 70 - 99 mg/dL   Comment 1 Notify RN    Comment 2 Document in Chart   Glucose, capillary  Result Value Ref Range   Glucose-Capillary 299 (H) 70 - 99 mg/dL   Comment 1 Notify RN    Comment 2 Document in Chart   Glucose, capillary  Result Value Ref Range   Glucose-Capillary 189 (H) 70 - 99 mg/dL   Comment 1 Notify RN    Comment 2 Document in Chart     Chest 2 View  Result Date: 12/08/2019 CLINICAL DATA:  Pre-op respiratory exam lumbar spine surgery. Diabetes and hypertension. EXAM: CHEST - 2 VIEW COMPARISON:  01/09/2019 FINDINGS: The  heart size and mediastinal contours are within normal limits. Both lungs are clear. The visualized skeletal structures are unremarkable. IMPRESSION: No active cardiopulmonary disease. Electronically Signed   By: Marlaine Hind M.D.   On: 12/08/2019 16:49   DG Lumbar Spine 1 View  Result Date: 12/10/2019 CLINICAL DATA:  Localization image for microdiscectomy EXAM: LUMBAR SPINE - 1 VIEW COMPARISON:  Lumbar radiographs February 27, 2019 FINDINGS: Lateral lumbar image labeled film #1 submitted. Metallic probe tip is posterior to the L3-4 interspace level. No fracture or spondylolisthesis. There is moderate disc space narrowing at L1-2, L2-3, and L3-4. IMPRESSION: Metallic probe tip is posterior to the L3-4 interspace. Disc space narrowing at several levels noted. No fracture or spondylolisthesis. Electronically Signed   By: Lowella Grip III M.D.   On: 12/10/2019 12:26    Antibiotics:  Anti-infectives (From admission, onward)   Start     Dose/Rate Route Frequency Ordered Stop   12/10/19 1830  ceFAZolin (ANCEF) IVPB 2g/100 mL premix        2 g 200 mL/hr over 30 Minutes Intravenous Every 8 hours 12/10/19 1502 12/11/19 0158   12/10/19 0900  ceFAZolin (ANCEF) IVPB 2g/100 mL premix        2 g 200 mL/hr over 30 Minutes Intravenous On call to O.R. 12/10/19 0854 12/10/19 1034      Discharge Exam: Blood pressure (!) 149/62, pulse 70, temperature 98.4 F (36.9 C), temperature source Oral,  resp. rate 20, height 5\' 4"  (1.626 m), weight 76.7 kg, SpO2 100 %. Neurologic: Grossly normal Dressing dry  Discharge Medications:   Allergies as of 12/11/2019      Reactions   Benadryl [diphenhydramine Hcl] Other (See Comments)   chills      Medication List    TAKE these medications   amLODipine-olmesartan 5-20 MG tablet Commonly known as: AZOR Take 1 tablet by mouth daily.   aspirin EC 81 MG tablet Take 81 mg by mouth daily.   atorvastatin 40 MG tablet Commonly known as: LIPITOR Take 40 mg by mouth  daily.   celecoxib 200 MG capsule Commonly known as: CELEBREX Take 1 capsule (200 mg total) by mouth 2 (two) times daily as needed for mild pain.   gabapentin 100 MG capsule Commonly known as: NEURONTIN Take 1 capsule (100 mg total) by mouth 3 (three) times daily. When necessary for neuropathy pain   GLUCOSAMINE PO Take 1,000 mg by mouth daily.   metFORMIN 500 MG 24 hr tablet Commonly known as: GLUCOPHAGE-XR Take 500 mg by mouth daily.   oxyCODONE-acetaminophen 5-325 MG tablet Commonly known as: PERCOCET/ROXICET Take 1 tablet by mouth every 6 (six) hours as needed.   Qsymia 7.5-46 MG Cp24 Generic drug: Phentermine-Topiramate Take 1 capsule by mouth daily.   tretinoin 0.025 % cream Commonly known as: RETIN-A Apply 1 application topically at bedtime.       Disposition: home   Final Dx: re-do microdiskectomy L3-4 R  Discharge Instructions     Remove dressing in 72 hours   Complete by: As directed    Call MD for:  difficulty breathing, headache or visual disturbances   Complete by: As directed    Call MD for:  persistant nausea and vomiting   Complete by: As directed    Call MD for:  redness, tenderness, or signs of infection (pain, swelling, redness, odor or green/yellow discharge around incision site)   Complete by: As directed    Call MD for:  severe uncontrolled pain   Complete by: As directed    Call MD for:  temperature >100.4   Complete by: As directed    Diet - low sodium heart healthy   Complete by: As directed    Increase activity slowly   Complete by: As directed          Signed: Eustace Moore 12/11/2019, 7:44 AM

## 2019-12-11 NOTE — Progress Notes (Signed)
Pt doing well. Pt and husband given D/C instructions with verbal understanding. Rx's were sent to the pharmacy by MD. Pt's incision is clean and dry with no sign of infection. Pt's IV was removed prior to D/C. Pt D/C'd home via wheelchair per MD order. Pt is stable @ D/C and has no other needs at this time. Abbigael Detlefsen, RN  

## 2019-12-11 NOTE — Evaluation (Signed)
Occupational Therapy Evaluation and Discharge Patient Details Name: Anna [name] MRN: [id number] DOB: 03-23-1950 Today's Date: 12/11/2019    History of Present Illness Pt is a 69 yo female with PHMx:lumbar spine sx, C-spine DJD, fibromyalgia, HTN, DM, CKD, and RLS. Pt underwent redo right L3-4 decompressive hemilaminectomy medial facetectomy and foraminotomies followed by microdiscectomy utilizing microscopic dissection on 12/10/2019   Clinical Impression   This 69 yo female admitted with and underwent above presents to acute OT with all education completed with patient. We will D/C from acute OT.    Follow Up Recommendations  No OT follow up;Supervision - Intermittent    Equipment Recommendations  None recommended by OT       Precautions / Restrictions Precautions Precautions: Back Precaution Booklet Issued: Yes (comment) Required Braces or Orthoses:  (no brace required per orders) Restrictions Weight Bearing Restrictions: No      Mobility Bed Mobility Overal bed mobility: Modified Independent             General bed mobility comments: increased time, educated on sequence of getting OOB  Transfers Overall transfer level: Needs assistance Equipment used: None Transfers: Sit to/from Stand Sit to Stand: Supervision         General transfer comment: Pt went up and down 4 steps with use of rail on left and my hand in hers on right for up and reverse order of hands for down the steps. Pt used a step-to pattern    Balance Overall balance assessment: Mild deficits observed, not formally tested                                         ADL either performed or assessed with clinical judgement   ADL Overall ADL's : Needs assistance/impaired Eating/Feeding: Independent;Sitting   Grooming: Oral care;Set up;Sitting Grooming Details (indicate cue type and reason): We did discuss the use of two cups for brushing teeth (one to spit in and one for rinsing)  to avoid bending over the sink Upper Body Bathing: Set up;Sitting   Lower Body Bathing: Supervison/ safety;Set up;Sit to/from stand   Upper Body Dressing : Set up;Sitting   Lower Body Dressing: Set up;Supervision/safety Lower Body Dressing Details (indicate cue type and reason): We discussed her crossing her legs (ankle over knee to donn and doff socks and shoes) Toilet Transfer: Supervision/safety;Ambulation Toilet Transfer Details (indicate cue type and reason): We discussed sit<>stand method that will make it easier for sit<>stand for lower surfaces and keeping her back straight   Toileting - Clothing Manipulation Details (indicate cue type and reason): We discussed using wet wipes would make this easier for hygiene   Tub/Shower Transfer Details (indicate cue type and reason): She has a walk in shower with several grab bars   General ADL Comments: Pt talked about putting in a grab bar at toilet (which would be good)     Vision Patient Visual Report: No change from baseline              Pertinent Vitals/Pain Pain Assessment: Faces Faces Pain Scale: Hurts little more Pain Location: right buttocks and thigh Pain Descriptors / Indicators: Aching;Sore;Spasm Pain Intervention(s): Limited activity within patient's tolerance;Monitored during session;Repositioned     Hand Dominance Right   Extremity/Trunk Assessment Upper Extremity Assessment Upper Extremity Assessment: Overall WFL for tasks assessed   Lower Extremity Assessment Lower Extremity Assessment: Overall WFL for tasks assessed  Communication Communication Communication: No difficulties   Cognition Arousal/Alertness: Awake/alert Behavior During Therapy: WFL for tasks assessed/performed Overall Cognitive Status: Within Functional Limits for tasks assessed                                                Home Living Family/patient expects to be discharged to:: Private residence Living  Arrangements: Spouse/significant other Available Help at Discharge: Family;Available 24 hours/day Type of Home: House Home Access: Stairs to enter [employer] of Steps: 4 Entrance Stairs-Rails: Left (going up) Home Layout: One level     Bathroom Shower/Tub: Occupational psychologist: Standard     Home Equipment: Environmental consultant - 2 wheels;Grab bars - tub/shower          Prior Functioning/Environment Level of Independence: Independent                 OT Problem List: Decreased range of motion;Impaired balance (sitting and/or standing);Pain         OT Goals(Current goals can be found in the care plan section) Acute Rehab OT Goals Patient Stated Goal: to go home today  OT Frequency:                AM-PAC OT "6 Clicks" Daily Activity     Outcome Measure Help from another person eating meals?: None Help from another person taking care of personal grooming?: A Little Help from another person toileting, which includes using toliet, bedpan, or urinal?: A Little Help from another person bathing (including washing, rinsing, drying)?: A Little Help from another person to put on and taking off regular upper body clothing?: A Little Help from another person to put on and taking off regular lower body clothing?: A Little 6 Click Score: 19   End of Session Equipment Utilized During Treatment: Gait belt Nurse Communication:  (pt ready to go from OT standpoing and husband is here)  Activity Tolerance: Patient tolerated treatment well Patient left: in chair;with call bell/phone within reach;with family/visitor present  OT Visit Diagnosis: Other abnormalities of gait and mobility (R26.89);Pain Pain - Right/Left: Right Pain - part of body: Leg                Time: 1594-5859 OT Time Calculation (min): 37 min Charges:  OT General Charges $OT Visit: 1 Visit OT Evaluation $OT Eval Moderate Complexity: 1 Mod OT Treatments $Self Care/Home Management : 8-22  mins  [address], OTR/L Acute [employer] Pager [phone] Office [phone]     Almon Register 12/11/2019, 9:40 AM

## 2020-01-10 ENCOUNTER — Emergency Department (HOSPITAL_COMMUNITY): Payer: Medicare Other

## 2020-01-10 ENCOUNTER — Inpatient Hospital Stay (HOSPITAL_COMMUNITY)
Admission: EM | Admit: 2020-01-10 | Discharge: 2020-01-16 | DRG: 871 | Disposition: A | Discharge status: Home Health | Payer: Medicare Other | Attending: Internal Medicine | Admitting: Internal Medicine

## 2020-01-10 ENCOUNTER — Encounter (HOSPITAL_COMMUNITY): Payer: Self-pay

## 2020-01-10 DIAGNOSIS — R509 Fever, unspecified: Secondary | ICD-10-CM | POA: Diagnosis not present

## 2020-01-10 DIAGNOSIS — G4700 Insomnia, unspecified: Secondary | ICD-10-CM | POA: Diagnosis not present

## 2020-01-10 DIAGNOSIS — Z7984 Long term (current) use of oral hypoglycemic drugs: Secondary | ICD-10-CM | POA: Diagnosis not present

## 2020-01-10 DIAGNOSIS — N12 Tubulo-interstitial nephritis, not specified as acute or chronic: Secondary | ICD-10-CM | POA: Diagnosis not present

## 2020-01-10 DIAGNOSIS — N136 Pyonephrosis: Secondary | ICD-10-CM | POA: Diagnosis not present

## 2020-01-10 DIAGNOSIS — M5459 Other low back pain: Secondary | ICD-10-CM | POA: Diagnosis not present

## 2020-01-10 DIAGNOSIS — Z79899 Other long term (current) drug therapy: Secondary | ICD-10-CM

## 2020-01-10 DIAGNOSIS — Z87442 Personal history of urinary calculi: Secondary | ICD-10-CM | POA: Diagnosis not present

## 2020-01-10 DIAGNOSIS — R32 Unspecified urinary incontinence: Secondary | ICD-10-CM | POA: Diagnosis not present

## 2020-01-10 DIAGNOSIS — M5489 Other dorsalgia: Secondary | ICD-10-CM | POA: Diagnosis not present

## 2020-01-10 DIAGNOSIS — R739 Hyperglycemia, unspecified: Secondary | ICD-10-CM | POA: Diagnosis present

## 2020-01-10 DIAGNOSIS — R200 Anesthesia of skin: Secondary | ICD-10-CM

## 2020-01-10 DIAGNOSIS — Z833 Family history of diabetes mellitus: Secondary | ICD-10-CM | POA: Diagnosis not present

## 2020-01-10 DIAGNOSIS — Z7982 Long term (current) use of aspirin: Secondary | ICD-10-CM | POA: Diagnosis not present

## 2020-01-10 DIAGNOSIS — Z888 Allergy status to other drugs, medicaments and biological substances status: Secondary | ICD-10-CM

## 2020-01-10 DIAGNOSIS — N179 Acute kidney failure, unspecified: Secondary | ICD-10-CM | POA: Diagnosis not present

## 2020-01-10 DIAGNOSIS — D72829 Elevated white blood cell count, unspecified: Secondary | ICD-10-CM | POA: Diagnosis present

## 2020-01-10 DIAGNOSIS — J90 Pleural effusion, not elsewhere classified: Secondary | ICD-10-CM | POA: Diagnosis not present

## 2020-01-10 DIAGNOSIS — R159 Full incontinence of feces: Secondary | ICD-10-CM | POA: Diagnosis present

## 2020-01-10 DIAGNOSIS — R000 Tachycardia, unspecified: Secondary | ICD-10-CM | POA: Diagnosis not present

## 2020-01-10 DIAGNOSIS — R197 Diarrhea, unspecified: Secondary | ICD-10-CM | POA: Diagnosis present

## 2020-01-10 DIAGNOSIS — E1165 Type 2 diabetes mellitus with hyperglycemia: Secondary | ICD-10-CM | POA: Diagnosis not present

## 2020-01-10 DIAGNOSIS — E876 Hypokalemia: Secondary | ICD-10-CM | POA: Diagnosis not present

## 2020-01-10 DIAGNOSIS — N132 Hydronephrosis with renal and ureteral calculous obstruction: Secondary | ICD-10-CM | POA: Diagnosis not present

## 2020-01-10 DIAGNOSIS — M797 Fibromyalgia: Secondary | ICD-10-CM | POA: Diagnosis present

## 2020-01-10 DIAGNOSIS — E872 Acidosis: Secondary | ICD-10-CM | POA: Diagnosis present

## 2020-01-10 DIAGNOSIS — F17210 Nicotine dependence, cigarettes, uncomplicated: Secondary | ICD-10-CM | POA: Diagnosis present

## 2020-01-10 DIAGNOSIS — I10 Essential (primary) hypertension: Secondary | ICD-10-CM | POA: Diagnosis not present

## 2020-01-10 DIAGNOSIS — R079 Chest pain, unspecified: Secondary | ICD-10-CM | POA: Diagnosis not present

## 2020-01-10 DIAGNOSIS — G9341 Metabolic encephalopathy: Secondary | ICD-10-CM | POA: Diagnosis not present

## 2020-01-10 DIAGNOSIS — Z981 Arthrodesis status: Secondary | ICD-10-CM

## 2020-01-10 DIAGNOSIS — R29898 Other symptoms and signs involving the musculoskeletal system: Secondary | ICD-10-CM

## 2020-01-10 DIAGNOSIS — Z20822 Contact with and (suspected) exposure to covid-19: Secondary | ICD-10-CM | POA: Diagnosis not present

## 2020-01-10 DIAGNOSIS — G2581 Restless legs syndrome: Secondary | ICD-10-CM | POA: Diagnosis present

## 2020-01-10 DIAGNOSIS — E871 Hypo-osmolality and hyponatremia: Secondary | ICD-10-CM | POA: Diagnosis present

## 2020-01-10 DIAGNOSIS — M545 Low back pain, unspecified: Secondary | ICD-10-CM

## 2020-01-10 DIAGNOSIS — Z90710 Acquired absence of both cervix and uterus: Secondary | ICD-10-CM

## 2020-01-10 DIAGNOSIS — R351 Nocturia: Secondary | ICD-10-CM | POA: Diagnosis not present

## 2020-01-10 DIAGNOSIS — G8929 Other chronic pain: Secondary | ICD-10-CM | POA: Diagnosis present

## 2020-01-10 DIAGNOSIS — A4151 Sepsis due to Escherichia coli [E. coli]: Secondary | ICD-10-CM | POA: Diagnosis not present

## 2020-01-10 DIAGNOSIS — J9811 Atelectasis: Secondary | ICD-10-CM | POA: Diagnosis not present

## 2020-01-10 DIAGNOSIS — R6520 Severe sepsis without septic shock: Secondary | ICD-10-CM | POA: Diagnosis not present

## 2020-01-10 DIAGNOSIS — I313 Pericardial effusion (noninflammatory): Secondary | ICD-10-CM | POA: Diagnosis not present

## 2020-01-10 LAB — CBC WITH DIFFERENTIAL/PLATELET
Abs Immature Granulocytes: 0.23 10*3/uL — ABNORMAL HIGH (ref 0.00–0.07)
Basophils Absolute: 0.1 10*3/uL (ref 0.0–0.1)
Basophils Relative: 0 %
Eosinophils Absolute: 0 10*3/uL (ref 0.0–0.5)
Eosinophils Relative: 0 %
HCT: 41.8 % (ref 36.0–46.0)
Hemoglobin: 14.1 g/dL (ref 12.0–15.0)
Immature Granulocytes: 1 %
Lymphocytes Relative: 3 %
Lymphs Abs: 0.5 10*3/uL — ABNORMAL LOW (ref 0.7–4.0)
MCH: 29.7 pg (ref 26.0–34.0)
MCHC: 33.7 g/dL (ref 30.0–36.0)
MCV: 88.2 fL (ref 80.0–100.0)
Monocytes Absolute: 1.4 10*3/uL — ABNORMAL HIGH (ref 0.1–1.0)
Monocytes Relative: 8 %
Neutro Abs: 16.2 10*3/uL — ABNORMAL HIGH (ref 1.7–7.7)
Neutrophils Relative %: 88 %
Platelets: 220 10*3/uL (ref 150–400)
RBC: 4.74 MIL/uL (ref 3.87–5.11)
RDW: 11.9 % (ref 11.5–15.5)
WBC: 18.5 10*3/uL — ABNORMAL HIGH (ref 4.0–10.5)
nRBC: 0 % (ref 0.0–0.2)

## 2020-01-10 LAB — COMPREHENSIVE METABOLIC PANEL
ALT: 16 U/L (ref 0–44)
AST: 13 U/L — ABNORMAL LOW (ref 15–41)
Albumin: 2.5 g/dL — ABNORMAL LOW (ref 3.5–5.0)
Alkaline Phosphatase: 98 U/L (ref 38–126)
Anion gap: 13 (ref 5–15)
BUN: 26 mg/dL — ABNORMAL HIGH (ref 8–23)
CO2: 22 mmol/L (ref 22–32)
Calcium: 9.5 mg/dL (ref 8.9–10.3)
Chloride: 95 mmol/L — ABNORMAL LOW (ref 98–111)
Creatinine, Ser: 1.51 mg/dL — ABNORMAL HIGH (ref 0.44–1.00)
GFR, Estimated: 37 mL/min — ABNORMAL LOW (ref >60)
Glucose, Bld: 321 mg/dL — ABNORMAL HIGH (ref 70–99)
Potassium: 3.7 mmol/L (ref 3.5–5.1)
Sodium: 130 mmol/L — ABNORMAL LOW (ref 135–145)
Total Bilirubin: 1.2 mg/dL (ref 0.3–1.2)
Total Protein: 6.5 g/dL (ref 6.5–8.1)

## 2020-01-10 LAB — RESPIRATORY PANEL BY RT PCR (FLU A&B, COVID)
Influenza A by PCR: NEGATIVE
Influenza B by PCR: NEGATIVE
SARS Coronavirus 2 by RT PCR: NEGATIVE

## 2020-01-10 LAB — SEDIMENTATION RATE: Sed Rate: 86 mm/hr — ABNORMAL HIGH (ref 0–22)

## 2020-01-10 LAB — LIPASE, BLOOD: Lipase: 16 U/L (ref 11–51)

## 2020-01-10 LAB — C-REACTIVE PROTEIN: CRP: 38.4 mg/dL — ABNORMAL HIGH (ref <1.0)

## 2020-01-10 LAB — LACTIC ACID, PLASMA: Lactic Acid, Venous: 1.3 mmol/L (ref 0.5–1.9)

## 2020-01-10 MED ORDER — SODIUM CHLORIDE 0.9 % IV BOLUS
1000.0000 mL | Freq: Once | INTRAVENOUS | Status: AC
  Administered 2020-01-10: 1000 mL via INTRAVENOUS

## 2020-01-10 MED ORDER — FENTANYL CITRATE (PF) 100 MCG/2ML IJ SOLN
50.0000 ug | Freq: Once | INTRAMUSCULAR | Status: AC
  Administered 2020-01-10: 50 ug via INTRAVENOUS
  Filled 2020-01-10: qty 2

## 2020-01-10 MED ORDER — GADOBUTROL 1 MMOL/ML IV SOLN
7.5000 mL | Freq: Once | INTRAVENOUS | Status: AC | PRN
  Administered 2020-01-10: 7.5 mL via INTRAVENOUS

## 2020-01-10 NOTE — ED Notes (Signed)
Patient transported to MRI 

## 2020-01-10 NOTE — ED Triage Notes (Addendum)
Assume care from EMS, EMS states pt recently had back surgery on lumbar 3 and 4  About 3 weeks ago. EMS states pt has not taken her pain medication oxycodone or gabapentin since weds, pt states it makes her "loopy". Pt reports not contacting her MD for different medication  144/80 HR 132 95 % RA 20 RR  CBG 365

## 2020-01-10 NOTE — ED Notes (Signed)
This RN stuck pt was unsuccessful even tried  straight stick pt for labs, put in for IV TEAM
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
## 2020-01-10 NOTE — ED Notes (Signed)
This RN explained to pt I will have to in & out pt if she is unable to provide urine, Pt states can she get another bag of fluids to see if that helps, Notified MD

## 2020-01-11 ENCOUNTER — Other Ambulatory Visit: Payer: Self-pay

## 2020-01-11 DIAGNOSIS — R197 Diarrhea, unspecified: Secondary | ICD-10-CM | POA: Diagnosis not present

## 2020-01-11 DIAGNOSIS — N12 Tubulo-interstitial nephritis, not specified as acute or chronic: Secondary | ICD-10-CM | POA: Diagnosis present

## 2020-01-11 DIAGNOSIS — B9620 Unspecified Escherichia coli [E. coli] as the cause of diseases classified elsewhere: Secondary | ICD-10-CM

## 2020-01-11 DIAGNOSIS — D72829 Elevated white blood cell count, unspecified: Secondary | ICD-10-CM | POA: Diagnosis present

## 2020-01-11 DIAGNOSIS — R739 Hyperglycemia, unspecified: Secondary | ICD-10-CM | POA: Diagnosis not present

## 2020-01-11 DIAGNOSIS — E871 Hypo-osmolality and hyponatremia: Secondary | ICD-10-CM

## 2020-01-11 DIAGNOSIS — R351 Nocturia: Secondary | ICD-10-CM | POA: Diagnosis not present

## 2020-01-11 DIAGNOSIS — R7881 Bacteremia: Secondary | ICD-10-CM

## 2020-01-11 LAB — COMPREHENSIVE METABOLIC PANEL
ALT: 14 U/L (ref 0–44)
AST: 12 U/L — ABNORMAL LOW (ref 15–41)
Albumin: 1.9 g/dL — ABNORMAL LOW (ref 3.5–5.0)
Alkaline Phosphatase: 87 U/L (ref 38–126)
Anion gap: 12 (ref 5–15)
BUN: 24 mg/dL — ABNORMAL HIGH (ref 8–23)
CO2: 19 mmol/L — ABNORMAL LOW (ref 22–32)
Calcium: 8 mg/dL — ABNORMAL LOW (ref 8.9–10.3)
Chloride: 101 mmol/L (ref 98–111)
Creatinine, Ser: 1.45 mg/dL — ABNORMAL HIGH (ref 0.44–1.00)
GFR, Estimated: 39 mL/min — ABNORMAL LOW (ref >60)
Glucose, Bld: 206 mg/dL — ABNORMAL HIGH (ref 70–99)
Potassium: 3.4 mmol/L — ABNORMAL LOW (ref 3.5–5.1)
Sodium: 132 mmol/L — ABNORMAL LOW (ref 135–145)
Total Bilirubin: 0.6 mg/dL (ref 0.3–1.2)
Total Protein: 5.4 g/dL — ABNORMAL LOW (ref 6.5–8.1)

## 2020-01-11 LAB — URINALYSIS, ROUTINE W REFLEX MICROSCOPIC
Bilirubin Urine: NEGATIVE
Glucose, UA: >=500 mg/dL — AB
Ketones, ur: 5 mg/dL — AB
Nitrite: NEGATIVE
Protein, ur: 100 mg/dL — AB
Specific Gravity, Urine: 1.018 (ref 1.005–1.030)
WBC, UA: >50 WBC/hpf — ABNORMAL HIGH (ref 0–5)
pH: 5 (ref 5.0–8.0)

## 2020-01-11 LAB — BLOOD CULTURE ID PANEL (REFLEXED) - BCID2

## 2020-01-11 LAB — CBC
HCT: 36.6 % (ref 36.0–46.0)
Hemoglobin: 12.1 g/dL (ref 12.0–15.0)
MCH: 29.8 pg (ref 26.0–34.0)
MCHC: 33.1 g/dL (ref 30.0–36.0)
MCV: 90.1 fL (ref 80.0–100.0)
Platelets: 179 10*3/uL (ref 150–400)
RBC: 4.06 MIL/uL (ref 3.87–5.11)
RDW: 12.1 % (ref 11.5–15.5)
WBC: 16.6 10*3/uL — ABNORMAL HIGH (ref 4.0–10.5)
nRBC: 0 % (ref 0.0–0.2)

## 2020-01-11 LAB — GLUCOSE, CAPILLARY
Glucose-Capillary: 202 mg/dL — ABNORMAL HIGH (ref 70–99)
Glucose-Capillary: 176 mg/dL — ABNORMAL HIGH (ref 70–99)
Glucose-Capillary: 130 mg/dL — ABNORMAL HIGH (ref 70–99)
Glucose-Capillary: 137 mg/dL — ABNORMAL HIGH (ref 70–99)
Glucose-Capillary: 140 mg/dL — ABNORMAL HIGH (ref 70–99)

## 2020-01-11 MED ORDER — SODIUM CHLORIDE 0.9 % IV SOLN
2.0000 g | INTRAVENOUS | Status: DC
  Administered 2020-01-11 – 2020-01-12 (×2): 2 g via INTRAVENOUS
  Filled 2020-01-11: qty 2
  Filled 2020-01-11 (×2): qty 20

## 2020-01-11 MED ORDER — SODIUM CHLORIDE 0.9 % IV BOLUS
500.0000 mL | Freq: Once | INTRAVENOUS | Status: AC
  Administered 2020-01-11: 500 mL via INTRAVENOUS

## 2020-01-11 MED ORDER — LACTATED RINGERS IV SOLN: INTRAVENOUS | Status: DC

## 2020-01-11 MED ORDER — INSULIN ASPART 100 UNIT/ML ~~LOC~~ SOLN
0.0000 [IU] | Freq: Three times a day (TID) | SUBCUTANEOUS | Status: DC
  Administered 2020-01-11 (×2): 1 [IU] via SUBCUTANEOUS
  Administered 2020-01-11: 2 [IU] via SUBCUTANEOUS
  Administered 2020-01-13 – 2020-01-15 (×3): 1 [IU] via SUBCUTANEOUS
  Administered 2020-01-15: 3 [IU] via SUBCUTANEOUS
  Administered 2020-01-16: 2 [IU] via SUBCUTANEOUS
  Administered 2020-01-16: 3 [IU] via SUBCUTANEOUS

## 2020-01-11 MED ORDER — MORPHINE SULFATE (PF) 4 MG/ML IV SOLN
3.0000 mg | INTRAVENOUS | Status: DC | PRN
  Administered 2020-01-11 – 2020-01-14 (×2): 3 mg via INTRAVENOUS
  Filled 2020-01-11 (×2): qty 1

## 2020-01-11 MED ORDER — LACTATED RINGERS IV SOLN: INTRAVENOUS | Status: AC

## 2020-01-11 MED ORDER — SODIUM CHLORIDE 0.9 % IV SOLN: 1.0000 g | INTRAVENOUS | Status: DC

## 2020-01-11 MED ORDER — IRBESARTAN 150 MG PO TABS
150.0000 mg | ORAL_TABLET | Freq: Every day | ORAL | Status: DC
  Administered 2020-01-11 – 2020-01-16 (×6): 150 mg via ORAL
  Filled 2020-01-11 (×7): qty 1

## 2020-01-11 MED ORDER — AMLODIPINE BESYLATE 5 MG PO TABS
5.0000 mg | ORAL_TABLET | Freq: Every day | ORAL | Status: DC
  Administered 2020-01-11 – 2020-01-16 (×6): 5 mg via ORAL
  Filled 2020-01-11 (×6): qty 1

## 2020-01-11 MED ORDER — POTASSIUM CHLORIDE CRYS ER 20 MEQ PO TBCR
40.0000 meq | EXTENDED_RELEASE_TABLET | Freq: Once | ORAL | Status: AC
  Administered 2020-01-11: 40 meq via ORAL
  Filled 2020-01-11: qty 2

## 2020-01-11 MED ORDER — OXYCODONE-ACETAMINOPHEN 5-325 MG PO TABS
1.0000 | ORAL_TABLET | ORAL | Status: DC | PRN
  Administered 2020-01-11 – 2020-01-14 (×4): 1 via ORAL
  Filled 2020-01-11 (×7): qty 1

## 2020-01-11 MED ORDER — AMLODIPINE-OLMESARTAN 5-20 MG PO TABS: 1.0000 | ORAL_TABLET | Freq: Every day | ORAL | Status: DC

## 2020-01-11 MED ORDER — ACETAMINOPHEN 325 MG PO TABS
650.0000 mg | ORAL_TABLET | Freq: Four times a day (QID) | ORAL | Status: DC | PRN
  Administered 2020-01-11 – 2020-01-16 (×7): 650 mg via ORAL
  Filled 2020-01-11 (×7): qty 2

## 2020-01-11 MED ORDER — HYDROMORPHONE HCL 1 MG/ML IJ SOLN
0.5000 mg | Freq: Once | INTRAMUSCULAR | Status: AC
  Administered 2020-01-11: 0.5 mg via INTRAVENOUS
  Filled 2020-01-11: qty 1

## 2020-01-11 MED ORDER — MORPHINE SULFATE (PF) 2 MG/ML IV SOLN
1.0000 mg | INTRAVENOUS | Status: DC | PRN
  Administered 2020-01-11: 1 mg via INTRAVENOUS
  Filled 2020-01-11: qty 1

## 2020-01-11 MED ORDER — SODIUM CHLORIDE 0.9 % IV SOLN
2.0000 g | Freq: Once | INTRAVENOUS | Status: AC
  Administered 2020-01-11: 2 g via INTRAVENOUS
  Filled 2020-01-11: qty 20

## 2020-01-11 NOTE — Plan of Care (Signed)

## 2020-01-11 NOTE — ED Notes (Signed)
MRI notified this RN Pt brain MRI scan will not get done in 12 hours due to having a MRI scan with dye already perform today

## 2020-01-11 NOTE — Progress Notes (Signed)
Patient seen for red MEWS.   She was presented last night with back pain and weakness, suspected to have pyelonephritis and found to have E coli bacteremia.    She is on appropriate antibiotics, BP has improved, HR stable, but has recurrent fever which resulted in MEWS score turning red again.   The patient reports mild headache but overall feeling better. Plan to treat fever and mild HA, otherwise continue current treatment and close monitoring. Discussed with patient and RN.

## 2020-01-11 NOTE — Progress Notes (Signed)
   01/11/20 2240  Assess: MEWS Score  Temp 99.4 F (37.4 C)  BP (!) 122/56  Pulse Rate 83  Resp 20  Level of Consciousness Alert  SpO2 98 %  O2 Device Room Air  Assess: MEWS Score  MEWS Temp 0  MEWS Systolic 0  MEWS Pulse 0  MEWS RR 0  MEWS LOC 0  MEWS Score 0  MEWS Score Color Green  Assess: if the MEWS score is Yellow or Red  Were vital signs taken at a resting state? Yes  Focused Assessment Change from prior assessment (see assessment flowsheet)  Early Detection of Sepsis Score *See Row Information* Low  MEWS guidelines implemented *See Row Information* No, other (Comment)  Treat  MEWS Interventions  (every hour VS check)  Pain Scale 0-10  Pain Score 0  Document  Patient Outcome Stabilized after interventions

## 2020-01-11 NOTE — ED Provider Notes (Signed)
I assumed care of this patient.  Please see previous provider note for further details of Hx, PE.  Briefly patient is a 69 y.o. female who presented with fever, back pain, urinary incontinence who recently had lumbar fusion last month.  Initial work-up notable for leukocytosis and elevated CRP levels.  MRI obtained and did not show any evidence of epidural abscess, discitis or osteomyelitis.  No cauda equina.  Currently pending UA and a call back from neurosurgery.  I spoke to the neurosurgery nurse practitioner he was updated on the patient's status.  They agreed to see the patient in the morning.  UA notable for urinary tract infection.  She was treated with IV antibiotics and admitted to medicine for further management.      Fatima Blank, MD 01/11/20 [phone]
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
## 2020-01-11 NOTE — Progress Notes (Signed)
PHARMACY - PHYSICIAN COMMUNICATION CRITICAL VALUE ALERT - BLOOD CULTURE IDENTIFICATION (BCID)  Anna [name] is an 69 y.o. female who presented to [facility] on 01/10/2020 with a chief complaint of leg pains, excessive urination and diarrhea and fever.   Assessment:  Patient with suspected pyelonephritis on Ceftriaxone. Blood cultures - 2 different sets positive for GNR. BCID showing Ecoli, no resistance gene detected.   Name of physician (or Provider) Contacted: Dr. Lupita Leash  Current antibiotics: Ceftriaxone 2g IV q24hrs.   Changes to prescribed antibiotics recommended:  Patient is on recommended antibiotics - No changes needed  Results for orders placed or performed during the hospital encounter of 01/10/20  Blood Culture ID Panel (Reflexed) (Collected: 01/10/2020  8:16 PM)  Result Value Ref Range   Enterococcus faecalis NOT DETECTED NOT DETECTED   Enterococcus Faecium NOT DETECTED NOT DETECTED   Listeria monocytogenes NOT DETECTED NOT DETECTED   Staphylococcus species NOT DETECTED NOT DETECTED   Staphylococcus aureus (BCID) NOT DETECTED NOT DETECTED   Staphylococcus epidermidis NOT DETECTED NOT DETECTED   Staphylococcus lugdunensis NOT DETECTED NOT DETECTED   Streptococcus species NOT DETECTED NOT DETECTED   Streptococcus agalactiae NOT DETECTED NOT DETECTED   Streptococcus pneumoniae NOT DETECTED NOT DETECTED   Streptococcus pyogenes NOT DETECTED NOT DETECTED   A.calcoaceticus-baumannii NOT DETECTED NOT DETECTED   Bacteroides fragilis NOT DETECTED NOT DETECTED   Enterobacterales DETECTED (A) NOT DETECTED   Enterobacter cloacae complex NOT DETECTED NOT DETECTED   Escherichia coli DETECTED (A) NOT DETECTED   Klebsiella aerogenes NOT DETECTED NOT DETECTED   Klebsiella oxytoca NOT DETECTED NOT DETECTED   Klebsiella pneumoniae NOT DETECTED NOT DETECTED   Proteus species NOT DETECTED NOT DETECTED   Salmonella species NOT DETECTED NOT DETECTED   Serratia marcescens NOT DETECTED NOT  DETECTED   Haemophilus influenzae NOT DETECTED NOT DETECTED   Neisseria meningitidis NOT DETECTED NOT DETECTED   Pseudomonas aeruginosa NOT DETECTED NOT DETECTED   Stenotrophomonas maltophilia NOT DETECTED NOT DETECTED   Candida albicans NOT DETECTED NOT DETECTED   Candida auris NOT DETECTED NOT DETECTED   Candida glabrata NOT DETECTED NOT DETECTED   Candida krusei NOT DETECTED NOT DETECTED   Candida parapsilosis NOT DETECTED NOT DETECTED   Candida tropicalis NOT DETECTED NOT DETECTED   Cryptococcus neoformans/gattii NOT DETECTED NOT DETECTED   CTX-M ESBL NOT DETECTED NOT DETECTED   Carbapenem resistance IMP NOT DETECTED NOT DETECTED   Carbapenem resistance KPC NOT DETECTED NOT DETECTED   Carbapenem resistance NDM NOT DETECTED NOT DETECTED   Carbapenem resist OXA 48 LIKE NOT DETECTED NOT DETECTED   Carbapenem resistance VIM NOT DETECTED NOT DETECTED   Sloan Leiter, PharmD, BCPS, BCCCP Clinical Pharmacist Please refer to [facility] for [place] numbers 01/11/2020  1:35 PM

## 2020-01-11 NOTE — Progress Notes (Signed)
Patient ID: Anna [name], female   DOB: Aug 04, 1950, 69 y.o.   MRN: [id number] MRI reviewed. No evidence of osteomyelitis or infection. R L3-4 much improved.  U/A shows many bacteria. Would treat UTI and I suspect her encephalopathy and urinary issues will improve. Would NOT suspect she all of a sudden has acute NPH in this setting.

## 2020-01-11 NOTE — Evaluation (Signed)
Physical Therapy Evaluation Patient Details Name: Anna [name] MRN: [id number] DOB: [birth year] Today's Date: 01/11/2020   History of Present Illness  69 y.o. female with medical history significant for fibromyalgia, hypertension, diabetes mellitus, chronic low back pain, restless leg syndrome, and previous back surgery presents to the emergency department for chief concerns of pain down her legs, difficulty walking, excessive urination, and diarrhea. In ED pt found to have  increased respiration, sinus tachycardia, normal low blood pressure, and SPO2 of 90 to 94% on room air. Pt admitted for observation 10/23 for treatment of possible pyelonephritis nephritis and encephalopathy  Clinical Impression  Pt lives with husband in multistory home with bed and bath on first floor with 4 steps to enter. Pt has been laying on couch for last week to 10 days, however prior to that she was able to ambulate in her home without AD, and perform ADLs independently, family provides for iADLs. Pt is currently limited in mobility by back pain in presence of LE weakness, R>L, and decreased strength and endurance. Pt is currently mod A for bed mobility, minA for transfers and modA for ambulation of 3 feet with RW. PT recommending HHPT with 24 hour assist especially during mobility. PT will continue to follow acutely.     Follow Up Recommendations Home health PT;Supervision/Assistance - 24 hour    Equipment Recommendations  Rolling walker with 5" wheels    Recommendations for Other Services OT consult     Precautions / Restrictions Precautions Precautions: Fall Restrictions Weight Bearing Restrictions: No      Mobility  Bed Mobility Overal bed mobility: Needs Assistance Bed Mobility: Supine to Sit     Supine to sit: Min assist;Mod assist     General bed mobility comments: minA for moving LE to EoB, modA for bringing trunk to upright, with increased time and effort pt able to scoot hips forward to  place feet on floor    Transfers Overall transfer level: Needs assistance Equipment used: Rolling walker (2 wheeled) Transfers: Sit to/from Stand Sit to Stand: Min assist         General transfer comment: min A for power up and steadying, vc for use of L UE to push up from bed and increased L LE use to compensate for R LE weakness  Ambulation/Gait Ambulation/Gait assistance: Min assist;Mod assist Gait Distance (Feet): 3 Feet Assistive device: Rolling walker (2 wheeled) Gait Pattern/deviations: Step-to pattern;Decreased step length - right;Decreased step length - left;Shuffle;Trunk flexed     General Gait Details: minA for stepping to chair, pt experiencing R LE, and pt becomes paniced and speeds up stepping, instructed in calm and increased use of UE to compensate for R LE weakness      Balance Overall balance assessment: Needs assistance Sitting-balance support: Feet supported;No upper extremity supported Sitting balance-Leahy Scale: Fair     Standing balance support: Bilateral upper extremity supported Standing balance-Leahy Scale: Poor                               Pertinent Vitals/Pain Pain Assessment: 0-10 Pain Score: 10-Worst pain ever Pain Location: LBP longstanding Pain Descriptors / Indicators: Throbbing Pain Intervention(s): Limited activity within patient's tolerance;Monitored during session;Premedicated before session;Repositioned    Home Living Family/patient expects to be discharged to:: Private residence Living Arrangements: Spouse/significant other Available Help at Discharge: Family;Available 24 hours/day Type of Home: House Home Access: Stairs to enter Entrance Stairs-Rails: Left Entrance Stairs-Number of Steps: 4  Home Layout: Multi-level;Able to live on main level with bedroom/bathroom Home Equipment: Grab bars - tub/shower;Toilet riser;Grab bars - toilet;Walker - standard      Prior Function Level of Independence: Needs assistance    Gait / Transfers Assistance Needed: ambluates household distances without AD  ADL's / Homemaking Assistance Needed: independent in ADLs, assist for iADLs        Hand Dominance   Dominant Hand: Right    Extremity/Trunk Assessment   Upper Extremity Assessment Upper Extremity Assessment: Generalized weakness    Lower Extremity Assessment Lower Extremity Assessment: LLE deficits/detail;RLE deficits/detail RLE Deficits / Details: R LE weakness increased distal to proximal, ROM WFL  RLE Sensation: WNL LLE Deficits / Details: ROM WFL, strength grossly 3+/5 LLE Sensation: WNL    Cervical / Trunk Assessment Cervical / Trunk Assessment: Other exceptions (long standing back pain ) Cervical / Trunk Exceptions: long standing LBP  Communication   Communication: No difficulties  Cognition Arousal/Alertness: Awake/alert Behavior During Therapy: WFL for tasks assessed/performed Overall Cognitive Status: Within Functional Limits for tasks assessed                                        General Comments General comments (skin integrity, edema, etc.): pt with reports of dizziness however VSS on RA         Assessment/Plan    PT Assessment Patient needs continued PT services  PT Problem List Decreased strength;Decreased activity tolerance;Decreased balance;Decreased mobility;Decreased coordination;Decreased safety awareness;Pain       PT Treatment Interventions DME instruction;Gait training;Stair training;Functional mobility training;Therapeutic activities;Therapeutic exercise;Balance training;Cognitive remediation;Patient/family education    PT Goals (Current goals can be found in the Care Plan section)  Acute Rehab PT Goals Patient Stated Goal: have less R LE weakness PT Goal Formulation: With patient/family Time For Goal Achievement: 01/25/20 Potential to Achieve Goals: Fair    Frequency Min 3X/week    AM-PAC PT "6 Clicks" Mobility  Outcome Measure Help  needed turning from your back to your side while in a flat bed without using bedrails?: None Help needed moving from lying on your back to sitting on the side of a flat bed without using bedrails?: A Lot Help needed moving to and from a bed to a chair (including a wheelchair)?: A Lot Help needed standing up from a chair using your arms (e.g., wheelchair or bedside chair)?: A Little Help needed to walk in hospital room?: A Lot Help needed climbing 3-5 steps with a railing? : Total 6 Click Score: 14    End of Session Equipment Utilized During Treatment: Gait belt Activity Tolerance: Patient limited by pain;Other (comment) (weakness and dizziness) Patient left: in chair;with call bell/phone within reach;with chair alarm set;with family/visitor present Nurse Communication: Mobility status PT Visit Diagnosis: Unsteadiness on feet (R26.81);Other abnormalities of gait and mobility (R26.89);Repeated falls (R29.6);Muscle weakness (generalized) (M62.81);History of falling (Z91.81);Difficulty in walking, not elsewhere classified (R26.2);Dizziness and giddiness (R42);Pain Pain - Right/Left: Right Pain - part of body: Leg (back)    Time: 4287-6811 PT Time Calculation (min) (ACUTE ONLY): 44 min   Charges:   PT Evaluation $PT Eval Moderate Complexity: 1 Mod PT Treatments $Therapeutic Activity: 23-37 mins        Braxxton Stoudt B. Migdalia Dk PT, DPT Acute Rehabilitation Services Pager [phone] Office [phone]   [place] 01/11/2020, 12:23 PM

## 2020-01-11 NOTE — Progress Notes (Signed)
PROGRESS NOTE    Anna [name]  TFT:[id number] DOB: Sep 02, 1950 DOA: 01/10/2020 PCP: Leanna Battles, MD   Chief Complaint  Patient presents with  . Back Pain   Brief Narrative: 83 yof with history of fibromyalgia, hypertension, diabetes mellitus, chronic low back pain, restless leg syndrome, recent lumbar laminectomy/decompression micro discectomy on 12/10/2027 by Dr. Sherley Bounds presented with 2 to 3 days history of pain down her legs, difficulty walking, excessive urination and diarrhea.  Patient reported there was fever but unclear what the T-max was, no fall no headache vision changes trouble swallowing cough.  Patient had urgency frequency of urination some shortness of breath as well as numbness down her legs. Patient was seen in the ED, had difficulty speaking was alert and oriented to self, age current location current year. 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  There is enhancing scar tissue in this region.  Similar multilevel degenerative changes including severe left foraminal stenosis at L3-L4 and L4-L5 and moderate right foraminal and subarticular recess stenosis at L2 to L3.  Portable chest x-ray was also ordered and showed no radiological evidence of active pulmonary disease". MRI brain was ordered, and patient was admitted.  Subjective:  Febrile 101.7 last night. At the bedside.  She thinks patient is much improved today much more alert awake and oriented. Some headaches on the left frontal area otherwise no complaint   Assessment & Plan:  Difficulty with ambulation/imbalance/excessive urination/change in behavior acute metabolic encephalopathy: Neurosurgeon has canceled MRI brain as he feels all of a sudden less likely NPH.  Patient has significant improvement it seems.  Neuro-surgery has advised continued treatment of UTI. Cont  PT OT  supportive care.  Low back pain with leukocytosis abnormal UA suspecting pyelonephritis: Continue ceftriaxone dose increased to 2 g, follow-up urine culture.   Recent lumbar laminectomy/decompression micro discectomy on 12/10/2027 by Dr. Sherley Bounds: MRI lumbar spine on admission showing improvement, neurosurgery was consulted by EDP. Sed rate and CRP elevated, but recent postop MRI lumbar spine no acute finding neurosurgery consulted slightly osteomyelitis of the spine  Multiple episodes of diarrhea/leukocytosis ruling out C. Difficile.  AKI likely from GI/diarrhea replete with IV fluids and monitor.  Metabolic acidosis in the setting of AKI, GI loss continue supportive care. HypoKalemia:will replete orally. Hyponatremia: Improving.  Continue IV fluids. Recent Labs  Lab 01/10/20 2016 01/11/20 0330  NA 130* 132*   T2DM, continue sliding insulin insulin, HBA1c poorly controlled 9.2 on 12/08/19. Pt with Excessive urination at night Recent Labs  Lab 01/11/20 0443 01/11/20 0622  GLUCAP 202* 176*   Fibromyalgia patient reports she is on Neurontin but did not respond well.  Nutrition: Diet Order            Diet clear liquid Room service appropriate? Yes; Fluid consistency: Thin  Diet effective now                Body mass index is 27.28 kg/m. DVT prophylaxis: Place TED hose Start: 01/11/20 0148 Code Status:   Code Status: Full Code  Family Communication: plan of care discussed with patient and patient's daughter at bedside.  Status is: admitted as Observation Remains hospitalized for ongoing management of UTI,balance issues, PT OT eval. Dispo:The patient is from: Home            Anticipated d/c is to: Home  Anticipated d/c date is: 1 day            Patient currently is not medically stable to d/c.  Consultants:see note  Procedures:see note  Culture/Microbiology    Component Value Date/Time   SDES BLOOD LEFT ANTECUBITAL 01/10/2020 2016   SDES BLOOD LEFT HAND  01/10/2020 2016   SPECREQUEST  01/10/2020 2016    BOTTLES DRAWN AEROBIC AND ANAEROBIC Blood Culture adequate volume   SPECREQUEST  01/10/2020 2016    BOTTLES DRAWN AEROBIC AND ANAEROBIC Blood Culture adequate volume   CULT  01/10/2020 2016    NO GROWTH < 24 HOURS Performed at [facility], [place] [address], [place], [place] [address]    CULT  01/10/2020 2016    NO GROWTH < 24 HOURS Performed at [facility], [place] [address], [place], [place] [address]    REPTSTATUS PENDING 01/10/2020 2016   REPTSTATUS PENDING 01/10/2020 2016    Other culture-see note  Medications: Scheduled Meds: . amLODipine  5 mg Oral Daily   And  . irbesartan  150 mg Oral Daily  . insulin aspart  0-9 Units Subcutaneous TID WC   Continuous Infusions: . cefTRIAXone (ROCEPHIN)  IV    . lactated ringers 125 mL/hr at 01/11/20 7322    Antimicrobials: Anti-infectives (From admission, onward)   Start     Dose/Rate Route Frequency Ordered Stop   01/11/20 2200  cefTRIAXone (ROCEPHIN) 1 g in sodium chloride 0.9 % 100 mL IVPB  Status:  Discontinued        1 g 200 mL/hr over 30 Minutes Intravenous Every 24 hours 01/11/20 0200 01/11/20 0827   01/11/20 2200  cefTRIAXone (ROCEPHIN) 2 g in sodium chloride 0.9 % 100 mL IVPB        2 g 200 mL/hr over 30 Minutes Intravenous Every 24 hours 01/11/20 0827     01/11/20 0045  cefTRIAXone (ROCEPHIN) 2 g in sodium chloride 0.9 % 100 mL IVPB        2 g 200 mL/hr over 30 Minutes Intravenous  Once 01/11/20 0042 01/11/20 0123     Objective: Vitals: Today's Vitals   01/11/20 0409 01/11/20 0550 01/11/20 0625 01/11/20 0916  BP: (!) 115/52  123/68 125/68  Pulse: (!) 113  (!) 110   Resp: 20  20 (!) 22  Temp: 98.5 F (36.9 C)  98.2 F (36.8 C) 98.8 F (37.1 C)  TempSrc: Oral  Oral Oral  SpO2: 97%  96% 95%  Weight: 72.1 kg     Height: 5\' 4"  (1.626 m)     PainSc: 10-Worst pain ever Asleep      Intake/Output Summary (Last 24 hours) at 01/11/2020 1051 Last data  filed at 01/11/2020 0916 Gross per 24 hour  Intake 2418.24 ml  Output --  Net 2418.24 ml   Filed Weights   01/11/20 0409  Weight: 72.1 kg   Weight change:   Intake/Output from previous day: 10/23 0701 - 10/24 0700 In: 2100 [IV Piggyback:2100] Out: -  Intake/Output this shift: Total I/O In: 318.2 [I.V.:318.2] Out: -   Examination: General exam:AAOx3,NAD,weak appearing. HEENT:Oral mucosa moist, Ear/Nose WNL grossly,dentition normal. Respiratory system: bilaterally clear,no wheezing or crackles,no use of accessory muscle, non tender. Cardiovascular system: S1 & S2 +,regular, No JVD. Gastrointestinal system: Abdomen soft, NT,ND, BS+. Nervous System:Alert, awake, moving extremities and grossly non-focal. Extremities: No edema, distal peripheral pulses palpable.  Skin: No rashes,no icterus. MSK: Normal muscle bulk,tone, power.  Data Reviewed: I have personally reviewed following labs  and imaging studies CBC: Recent Labs  Lab 01/10/20 2016 01/11/20 0330  WBC 18.5* 16.6*  NEUTROABS 16.2*  --   HGB 14.1 12.1  HCT 41.8 36.6  MCV 88.2 90.1  PLT 220 638   Basic Metabolic Panel: Recent Labs  Lab 01/10/20 2016 01/11/20 0330  NA 130* 132*  K 3.7 3.4*  CL 95* 101  CO2 22 19*  GLUCOSE 321* 206*  BUN 26* 24*  CREATININE 1.51* 1.45*  CALCIUM 9.5 8.0*   GFR: Estimated Creatinine Clearance: 34.7 mL/min (A) (by C-G formula based on SCr of 1.45 mg/dL (H)). Liver Function Tests: Recent Labs  Lab 01/10/20 2016 01/11/20 0330  AST 13* 12*  ALT 16 14  ALKPHOS 98 87  BILITOT 1.2 0.6  PROT 6.5 5.4*  ALBUMIN 2.5* 1.9*   Recent Labs  Lab 01/10/20 2016  LIPASE 16   No results for input(s): AMMONIA in the last 168 hours. Coagulation Profile: No results for input(s): INR, PROTIME in the last 168 hours. Cardiac Enzymes: No results for input(s): CKTOTAL, CKMB, CKMBINDEX, TROPONINI in the last 168 hours. BNP (last 3 results) No results for input(s): PROBNP in the last  8760 hours. HbA1C: No results for input(s): HGBA1C in the last 72 hours. CBG: Recent Labs  Lab 01/11/20 0443 01/11/20 0622  GLUCAP 202* 176*   Lipid Profile: No results for input(s): CHOL, HDL, LDLCALC, TRIG, CHOLHDL, LDLDIRECT in the last 72 hours. Thyroid Function Tests: No results for input(s): TSH, T4TOTAL, FREET4, T3FREE, THYROIDAB in the last 72 hours. Anemia Panel: No results for input(s): VITAMINB12, FOLATE, FERRITIN, TIBC, IRON, RETICCTPCT in the last 72 hours. Sepsis Labs: Recent Labs  Lab 01/10/20 2016  LATICACIDVEN 1.3    Recent Results (from the past 240 hour(s))  Respiratory Panel by RT PCR (Flu A&B, Covid) - Nasopharyngeal Swab     Status: None   Collection Time: 01/10/20  7:58 PM   Specimen: Nasopharyngeal Swab  Result Value Ref Range Status   SARS Coronavirus 2 by RT PCR NEGATIVE NEGATIVE Final    Comment: (NOTE) SARS-CoV-2 target nucleic acids are NOT DETECTED.  The SARS-CoV-2 RNA is generally detectable in upper respiratoy specimens during the acute phase of infection. The lowest concentration of SARS-CoV-2 viral copies this assay can detect is 131 copies/mL. A negative result does not preclude SARS-Cov-2 infection and should not be used as the sole basis for treatment or other patient management decisions. A negative result may occur with  improper specimen collection/handling, submission of specimen other than nasopharyngeal swab, presence of viral mutation(s) within the areas targeted by this assay, and inadequate number of viral copies (<131 copies/mL). A negative result must be combined with clinical observations, patient history, and epidemiological information. The expected result is Negative.  Fact Sheet for Patients:  PinkCheek.be  Fact Sheet for Healthcare Providers:  GravelBags.it  This test is no t yet approved or cleared by the [place] FDA and  has been authorized for  detection and/or diagnosis of SARS-CoV-2 by FDA under an Emergency Use Authorization (EUA). This EUA will remain  in effect (meaning this test can be used) for the duration of the COVID-19 declaration under Section 564(b)(1) of the Act, 21 U.S.C. section 360bbb-3(b)(1), unless the authorization is terminated or revoked sooner.     Influenza A by PCR NEGATIVE NEGATIVE Final   Influenza B by PCR NEGATIVE NEGATIVE Final    Comment: (NOTE) The Xpert Xpress SARS-CoV-2/FLU/RSV assay is intended as an aid in  the diagnosis of influenza  from Nasopharyngeal swab specimens and  should not be used as a sole basis for treatment. Nasal washings and  aspirates are unacceptable for Xpert Xpress SARS-CoV-2/FLU/RSV  testing.  Fact Sheet for Patients: PinkCheek.be  Fact Sheet for Healthcare Providers: GravelBags.it  This test is not yet approved or cleared by the [place] FDA and  has been authorized for detection and/or diagnosis of SARS-CoV-2 by  FDA under an Emergency Use Authorization (EUA). This EUA will remain  in effect (meaning this test can be used) for the duration of the  Covid-19 declaration under Section 564(b)(1) of the Act, 21  U.S.C. section 360bbb-3(b)(1), unless the authorization is  terminated or revoked. Performed at [facility], [place] [address] [place], [place] [address]   Blood culture (routine x 2)     Status: None (Preliminary result)   Collection Time: 01/10/20  8:16 PM   Specimen: BLOOD  Result Value Ref Range Status   Specimen Description BLOOD LEFT ANTECUBITAL  Final   Special Requests   Final    BOTTLES DRAWN AEROBIC AND ANAEROBIC Blood Culture adequate volume   Culture   Final    NO GROWTH < 24 HOURS Performed at [facility], [place] [address], [place], [place] [address]    Report Status PENDING  Incomplete  Blood culture (routine x 2)     Status: None (Preliminary result)   Collection  Time: 01/10/20  8:16 PM   Specimen: BLOOD LEFT HAND  Result Value Ref Range Status   Specimen Description BLOOD LEFT HAND  Final   Special Requests   Final    BOTTLES DRAWN AEROBIC AND ANAEROBIC Blood Culture adequate volume   Culture   Final    NO GROWTH < 24 HOURS Performed at [facility], [place] [address], [place], [place] [address]    Report Status PENDING  Incomplete     Radiology Studies: MR Lumbar Spine W Wo Contrast  Result Date: 01/10/2020 CLINICAL DATA:  Lumbar radiculopathy. EXAM: MRI LUMBAR SPINE WITHOUT AND WITH CONTRAST TECHNIQUE: Multiplanar and multiecho pulse sequences of the lumbar spine were obtained without and with intravenous contrast. CONTRAST:  7.43mL GADAVIST GADOBUTROL 1 MMOL/ML IV SOLN COMPARISON:  MRI 11/01/2019 FINDINGS: Segmentation:  Standard. Alignment: 2 mm of retrolisthesis of L2 on L3 and retrolisthesis of L3 on L4, similar to prior. Vertebrae:  No fracture, evidence of discitis, or bone lesion. Conus medullaris and cauda equina: Conus extends to the L1 level. Conus appears normal. Paraspinal and other soft tissues: No acute abnormality. Disc levels: T12-L1: No significant disc protrusion, foraminal stenosis, or canal stenosis. L1-L2: No significant disc protrusion, foraminal stenosis, or canal stenosis. L2-L3: Similar broad-based disc bulge with right lateral prominent disc osteophyte complex. Similar moderate right greater than left facet arthropathy. Resulting moderate right foraminal stenosis and right subarticular recess stenosis. Mild canal stenosis. L3-L4: Interval right micro discectomy with right laminotomy. Enhancing soft tissue in the right subarticular recess is consistent with scar tissue. There is significantly improved right subarticular recess stenosis. Bulky facet arthropathy results in mild right and severe left foraminal stenosis. No significant canal stenosis. L4-L5: Broad-based disc bulge with severe left and moderate right facet  hypertrophy. Bilateral subarticular recess stenosis. Severe left foraminal stenosis. Mild right foraminal stenosis. L5-S1: Severe bilateral facet hypertrophy without significant canal or foraminal stenosis. IMPRESSION: 1. 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 There is enhancing scar tissue in this region. 2. Otherwise, similar multilevel degenerative change including  severe left foraminal stenosis at L3-L4 and L4-L5 and moderate right foraminal and subarticular recess stenosis at L2-L3. Electronically Signed   By: Margaretha Sheffield MD   On: 01/10/2020 22:37   DG Chest Portable 1 View  Result Date: 01/10/2020 CLINICAL DATA:  Fever.  Recent back surgery. EXAM: PORTABLE CHEST 1 VIEW COMPARISON:  12/08/2019 FINDINGS: Heart size and pulmonary vascularity are normal. Shallow inspiration. Lungs are clear. No pleural effusions. No pneumothorax. Mediastinal contours appear intact. Degenerative changes in the spine and shoulders. IMPRESSION: Shallow inspiration. No evidence of active pulmonary disease. Electronically Signed   By: Lucienne Capers M.D.   On: 01/10/2020 22:36     LOS: 0 days   Antonieta Pert, MD [facility]  01/11/2020, 10:51 AM

## 2020-01-11 NOTE — Progress Notes (Signed)
°   01/11/20 2040  Assess: MEWS Score  Temp (!) 102.7 F (39.3 C)  BP 132/74  Pulse Rate (!) 110  Resp (!) 22  Level of Consciousness Alert  SpO2 100 %  O2 Device Room Air  Assess: MEWS Score  MEWS Temp 2  MEWS Systolic 0  MEWS Pulse 1  MEWS RR 1  MEWS LOC 0  MEWS Score 4  MEWS Score Color Red  Assess: if the MEWS score is Yellow or Red  Were vital signs taken at a resting state? Yes  Focused Assessment Change from prior assessment (see assessment flowsheet)  Early Detection of Sepsis Score *See Row Information* High  MEWS guidelines implemented *See Row Information* Yes  Treat  Pain Scale 0-10  Pain Score 9  Pain Type Acute pain  Pain Location Head  Pain Descriptors / Indicators Aching  Pain Onset On-going  Patients Stated Pain Goal 0  Pain Intervention(s) Medication (See eMAR);Repositioned;Emotional support;Rest  Take Vital Signs  Increase Vital Sign Frequency  Red: Q 1hr X 4 then Q 4hr X 4, if remains red, continue Q 4hrs  Escalate  MEWS: Escalate Red: discuss with charge nurse/RN and provider, consider discussing with RRT  Notify: Charge Nurse/RN  Name of Charge Nurse/RN Notified Phil  Date Charge Nurse/RN Notified 01/11/20  Time Charge Nurse/RN Notified 2043  Notify: Provider  Provider Name/Title Dr. Myna Hidalgo  Date Provider Notified 01/11/20  Time Provider Notified 2040  Notification Type Page  Notification Reason Change in status  Response See new orders  Date of Provider Response 01/11/20  Time of Provider Response 2055  Document  Patient Outcome Not stable and remains on department  Progress note created (see row info) Yes

## 2020-01-11 NOTE — Progress Notes (Signed)
Pt admitted from [facility] awake alert and oriented x 4 but forgetful . Assisted to bed and  Enteric precaution maintained pt has no bowel movement  . Will send specimen if pt has BM,

## 2020-01-12 DIAGNOSIS — E1165 Type 2 diabetes mellitus with hyperglycemia: Secondary | ICD-10-CM | POA: Diagnosis not present

## 2020-01-12 DIAGNOSIS — R32 Unspecified urinary incontinence: Secondary | ICD-10-CM | POA: Diagnosis present

## 2020-01-12 DIAGNOSIS — M797 Fibromyalgia: Secondary | ICD-10-CM | POA: Diagnosis present

## 2020-01-12 DIAGNOSIS — Z20822 Contact with and (suspected) exposure to covid-19: Secondary | ICD-10-CM | POA: Diagnosis not present

## 2020-01-12 DIAGNOSIS — R159 Full incontinence of feces: Secondary | ICD-10-CM | POA: Diagnosis present

## 2020-01-12 DIAGNOSIS — G4700 Insomnia, unspecified: Secondary | ICD-10-CM | POA: Diagnosis present

## 2020-01-12 DIAGNOSIS — Z7984 Long term (current) use of oral hypoglycemic drugs: Secondary | ICD-10-CM | POA: Diagnosis not present

## 2020-01-12 DIAGNOSIS — F17210 Nicotine dependence, cigarettes, uncomplicated: Secondary | ICD-10-CM | POA: Diagnosis present

## 2020-01-12 DIAGNOSIS — R197 Diarrhea, unspecified: Secondary | ICD-10-CM | POA: Diagnosis not present

## 2020-01-12 DIAGNOSIS — Z79899 Other long term (current) drug therapy: Secondary | ICD-10-CM | POA: Diagnosis not present

## 2020-01-12 DIAGNOSIS — G2581 Restless legs syndrome: Secondary | ICD-10-CM | POA: Diagnosis present

## 2020-01-12 DIAGNOSIS — A4151 Sepsis due to Escherichia coli [E. coli]: Secondary | ICD-10-CM | POA: Diagnosis present

## 2020-01-12 DIAGNOSIS — Z833 Family history of diabetes mellitus: Secondary | ICD-10-CM | POA: Diagnosis not present

## 2020-01-12 DIAGNOSIS — N136 Pyonephrosis: Secondary | ICD-10-CM | POA: Diagnosis not present

## 2020-01-12 DIAGNOSIS — E871 Hypo-osmolality and hyponatremia: Secondary | ICD-10-CM | POA: Diagnosis not present

## 2020-01-12 DIAGNOSIS — I10 Essential (primary) hypertension: Secondary | ICD-10-CM | POA: Diagnosis not present

## 2020-01-12 DIAGNOSIS — D72829 Elevated white blood cell count, unspecified: Secondary | ICD-10-CM | POA: Diagnosis not present

## 2020-01-12 DIAGNOSIS — G8929 Other chronic pain: Secondary | ICD-10-CM | POA: Diagnosis present

## 2020-01-12 DIAGNOSIS — G9341 Metabolic encephalopathy: Secondary | ICD-10-CM | POA: Diagnosis not present

## 2020-01-12 DIAGNOSIS — E876 Hypokalemia: Secondary | ICD-10-CM | POA: Diagnosis not present

## 2020-01-12 DIAGNOSIS — N179 Acute kidney failure, unspecified: Secondary | ICD-10-CM | POA: Diagnosis not present

## 2020-01-12 DIAGNOSIS — Z7982 Long term (current) use of aspirin: Secondary | ICD-10-CM | POA: Diagnosis not present

## 2020-01-12 DIAGNOSIS — N12 Tubulo-interstitial nephritis, not specified as acute or chronic: Secondary | ICD-10-CM | POA: Diagnosis not present

## 2020-01-12 DIAGNOSIS — E872 Acidosis: Secondary | ICD-10-CM | POA: Diagnosis not present

## 2020-01-12 DIAGNOSIS — Z87442 Personal history of urinary calculi: Secondary | ICD-10-CM | POA: Diagnosis not present

## 2020-01-12 LAB — CBC
HCT: 39.5 % (ref 36.0–46.0)
Hemoglobin: 13.1 g/dL (ref 12.0–15.0)
MCH: 30.7 pg (ref 26.0–34.0)
MCHC: 33.2 g/dL (ref 30.0–36.0)
MCV: 92.5 fL (ref 80.0–100.0)
Platelets: 164 10*3/uL (ref 150–400)
RBC: 4.27 MIL/uL (ref 3.87–5.11)
RDW: 12.4 % (ref 11.5–15.5)
WBC: 17.5 10*3/uL — ABNORMAL HIGH (ref 4.0–10.5)
nRBC: 0 % (ref 0.0–0.2)

## 2020-01-12 LAB — BASIC METABOLIC PANEL
Anion gap: 11 (ref 5–15)
BUN: 18 mg/dL (ref 8–23)
CO2: 17 mmol/L — ABNORMAL LOW (ref 22–32)
Calcium: 8.2 mg/dL — ABNORMAL LOW (ref 8.9–10.3)
Chloride: 106 mmol/L (ref 98–111)
Creatinine, Ser: 1.29 mg/dL — ABNORMAL HIGH (ref 0.44–1.00)
GFR, Estimated: 44 mL/min — ABNORMAL LOW (ref >60)
Glucose, Bld: 99 mg/dL (ref 70–99)
Potassium: 4.4 mmol/L (ref 3.5–5.1)
Sodium: 134 mmol/L — ABNORMAL LOW (ref 135–145)

## 2020-01-12 LAB — HEMOGLOBIN A1C
Hgb A1c MFr Bld: 10.2 % — ABNORMAL HIGH (ref 4.8–5.6)
Mean Plasma Glucose: 246 mg/dL

## 2020-01-12 LAB — GLUCOSE, CAPILLARY
Glucose-Capillary: 128 mg/dL — ABNORMAL HIGH (ref 70–99)
Glucose-Capillary: 109 mg/dL — ABNORMAL HIGH (ref 70–99)

## 2020-01-12 MED ORDER — SODIUM CHLORIDE 0.9 % IV BOLUS
500.0000 mL | Freq: Once | INTRAVENOUS | Status: AC
  Administered 2020-01-12: 500 mL via INTRAVENOUS

## 2020-01-12 MED ORDER — LACTATED RINGERS IV SOLN: INTRAVENOUS | Status: DC

## 2020-01-12 MED ORDER — ENOXAPARIN SODIUM 40 MG/0.4ML ~~LOC~~ SOLN
40.0000 mg | SUBCUTANEOUS | Status: DC
  Administered 2020-01-13 – 2020-01-16 (×3): 40 mg via SUBCUTANEOUS
  Filled 2020-01-12 (×4): qty 0.4

## 2020-01-12 NOTE — Progress Notes (Signed)
PROGRESS NOTE    Anna [name]  UEA:[id number] DOB: [birth year] DOA: 01/10/2020 PCP: Leanna Battles, MD   Chief Complaint  Patient presents with  . Back Pain   Brief Narrative: 76 yof with history of fibromyalgia, hypertension, diabetes mellitus, chronic low back pain, restless leg syndrome, recent lumbar laminectomy/decompression micro discectomy on 12/10/2027 by Dr. Sherley Bounds presented with 2 to 3 days history of pain down her legs, difficulty walking, excessive urination and diarrhea.  Patient reported there was fever but unclear what the T-max was, no fall no headache vision changes trouble swallowing cough.  Patient had urgency frequency of urination some shortness of breath as well as numbness down her legs. Patient was seen in the ED, had difficulty speaking was alert and oriented to self, age current location current year. 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  There is enhancing scar tissue in this region.  Similar multilevel degenerative changes including severe left foraminal stenosis at L3-L4 and L4-L5 and moderate right foraminal and subarticular recess stenosis at L2 to L3.  Portable chest x-ray was also ordered and showed no radiological evidence of active pulmonary disease". MRI brain was ordered, and patient was admitted.  Subjective:  Overnight red MEWS 2/2 tachycardia fever w/ T max 102.2 Daughter at the bedside.  Patient has no new complaints this morning.  Still has dysuria.   Assessment & Plan:  E. coli Sepsis POA due to E. coli UTI/pyelonephritis.  Patient met sepsis right ear with fever more than 100.4, tachycardia more than 100 and altered mental status with acute metabolic encephalopathy.  Cont seftriaxone 2 g daily.  Follow-up urine and blood culture.  Intermittent tachycardia and fever noted.  E. coli pyelonephritis continue  antibiotics as #1.  Difficulty with ambulation/imbalance/excessive urination/change in behavior acute metabolic encephalopathy: Likely from #1.  Neurosurgeon has canceled MRI brain as he feels all of a sudden less likely NPH.  Felt patient was much improved. Neuro-surgery has advised continued treatment of UTI. Cont  PT OT supportive care.  Recent lumbar laminectomy/decompression micro discectomy on 12/10/2027 by Dr. Sherley Bounds: MRI lumbar spine on admission showing improvement, neurosurgery was consulted by EDP. Sed rate and CRP elevated, but recent postop MRI lumbar spine no acute finding neurosurgery consulted and no further recommendation from neurosurgery.  Multiple episodes of diarrhea/leukocytosis ruling out C. Difficile.  No diarrhea to check C. difficile.  AKI likely from GI/diarrhea creatinine is nicely improving.  Keep on IV fluid hydration gently.   Metabolic acidosis in the setting of AKI, GI loss continue supportive care.  hco3 a 17  HypoKalemia:Resolved.  Hyponatremia:Sodium improved.   T2DM, blood sugar well controlled continue sliding scale insulin,HBA1c poorly controlled 9.2 on 12/08/19. Pt with Excessive urination at night. Recent Labs  Lab 01/11/20 0443 01/11/20 0622 01/11/20 1239 01/11/20 1708 01/11/20 2122  GLUCAP 202* 176* 130* 137* 140*   Fibromyalgia patient reports she is on Neurontin but did not respond well.  Diet Order            Diet clear liquid Room service appropriate? Yes; Fluid consistency: Thin  Diet effective now                Body mass index is 27.28 kg/m. DVT prophylaxis: enoxaparin (LOVENOX) injection 40 mg Start: 01/12/20 0945 Place and maintain sequential compression device Start: 01/12/20 0856 Place TED hose Start: 01/11/20 0148 Code Status:  Code Status: Full Code  Family Communication: plan of care discussed with patient and patient's daughter at bedside.  Status is: admitted as Observation Remains hospitalized for ongoing  management of E. coli sepsis bacteremia UTI Dispo:The patient is from: Home            Anticipated d/c is to: Home w/ [facility]            Anticipated d/c date is: 1-2 days            Patient currently is not medically stable to d/c.  Consultants:see note  Procedures:see note  Culture/Microbiology    Component Value Date/Time   SDES URINE, RANDOM 01/10/2020 2355   SPECREQUEST NONE 01/10/2020 2355   CULT (A) 01/10/2020 2355    >=100,000 COLONIES/mL ESCHERICHIA COLI SUSCEPTIBILITIES TO FOLLOW Performed at [facility] [address] [place], [place] [address]    REPTSTATUS PENDING 01/10/2020 2355    Other culture-see note  Medications: Scheduled Meds: . amLODipine  5 mg Oral Daily   And  . irbesartan  150 mg Oral Daily  . enoxaparin (LOVENOX) injection  40 mg Subcutaneous Q24H  . insulin aspart  0-9 Units Subcutaneous TID WC   Continuous Infusions: . cefTRIAXone (ROCEPHIN)  IV Stopped (01/11/20 2152)   Antimicrobials: Anti-infectives (From admission, onward)   Start     Dose/Rate Route Frequency Ordered Stop   01/11/20 2200  cefTRIAXone (ROCEPHIN) 1 g in sodium chloride 0.9 % 100 mL IVPB  Status:  Discontinued        1 g 200 mL/hr over 30 Minutes Intravenous Every 24 hours 01/11/20 0200 01/11/20 0827   01/11/20 2200  cefTRIAXone (ROCEPHIN) 2 g in sodium chloride 0.9 % 100 mL IVPB        2 g 200 mL/hr over 30 Minutes Intravenous Every 24 hours 01/11/20 0827     01/11/20 0045  cefTRIAXone (ROCEPHIN) 2 g in sodium chloride 0.9 % 100 mL IVPB        2 g 200 mL/hr over 30 Minutes Intravenous  Once 01/11/20 0042 01/11/20 0123     Objective: Vitals: Today's Vitals   01/11/20 2340 01/12/20 0412 01/12/20 0710 01/12/20 0900  BP: 116/69 (!) 167/77  131/70  Pulse: 79 97  88  Resp: 20 20  18   Temp: 98.6 F (37 C) 98.5 F (36.9 C)  99.5 F (37.5 C)  TempSrc: Oral Oral  Axillary  SpO2: 94% 96%  97%  Weight:      Height:      PainSc: Asleep 0-No pain 0-No pain      Intake/Output Summary (Last 24 hours) at 01/12/2020 1043 Last data filed at 01/12/2020 0733 Gross per 24 hour  Intake 4117.72 ml  Output 350 ml  Net 3767.72 ml   Filed Weights   01/11/20 0409  Weight: 72.1 kg   Weight change:   Intake/Output from previous day: 10/24 0701 - 10/25 0700 In: 2895.1 [P.O.:360; I.V.:1932.7; IV Piggyback:602.4] Out: 350 [Urine:350] Intake/Output this shift: Total I/O In: 1540.9 [I.V.:1038.2; IV Piggyback:502.7] Out: -   Examination: General exam: AAOx3 , NAD, weak appearing. HEENT:Oral mucosa moist, Ear/Nose WNL grossly, dentition normal. Respiratory system: bilaterally clear,no wheezing or crackles,no use of accessory muscle Cardiovascular system: S1 & S2 +, No JVD,. Gastrointestinal system: Abdomen soft, NT,ND, BS+ Nervous System:Alert, awake, moving extremities and grossly nonfocal Extremities: No edema, distal peripheral pulses palpable.  Skin: No rashes,no icterus. MSK: Normal muscle bulk,tone, power  Data Reviewed: I have personally reviewed following labs  and imaging studies CBC: Recent Labs  Lab 01/10/20 2016 01/11/20 0330 01/12/20 0446  WBC 18.5* 16.6* 17.5*  NEUTROABS 16.2*  --   --   HGB 14.1 12.1 13.1  HCT 41.8 36.6 39.5  MCV 88.2 90.1 92.5  PLT 220 179 299   Basic Metabolic Panel: Recent Labs  Lab 01/10/20 2016 01/11/20 0330 01/12/20 0446  NA 130* 132* 134*  K 3.7 3.4* 4.4  CL 95* 101 106  CO2 22 19* 17*  GLUCOSE 321* 206* 99  BUN 26* 24* 18  CREATININE 1.51* 1.45* 1.29*  CALCIUM 9.5 8.0* 8.2*   GFR: Estimated Creatinine Clearance: 39 mL/min (A) (by C-G formula based on SCr of 1.29 mg/dL (H)). Liver Function Tests: Recent Labs  Lab 01/10/20 2016 01/11/20 0330  AST 13* 12*  ALT 16 14  ALKPHOS 98 87  BILITOT 1.2 0.6  PROT 6.5 5.4*  ALBUMIN 2.5* 1.9*   Recent Labs  Lab 01/10/20 2016  LIPASE 16   No results for input(s): AMMONIA in the last 168 hours. Coagulation Profile: No results for  input(s): INR, PROTIME in the last 168 hours. Cardiac Enzymes: No results for input(s): CKTOTAL, CKMB, CKMBINDEX, TROPONINI in the last 168 hours. BNP (last 3 results) No results for input(s): PROBNP in the last 8760 hours. HbA1C: No results for input(s): HGBA1C in the last 72 hours. CBG: Recent Labs  Lab 01/11/20 0443 01/11/20 0622 01/11/20 1239 01/11/20 1708 01/11/20 2122  GLUCAP 202* 176* 130* 137* 140*   Lipid Profile: No results for input(s): CHOL, HDL, LDLCALC, TRIG, CHOLHDL, LDLDIRECT in the last 72 hours. Thyroid Function Tests: No results for input(s): TSH, T4TOTAL, FREET4, T3FREE, THYROIDAB in the last 72 hours. Anemia Panel: No results for input(s): VITAMINB12, FOLATE, FERRITIN, TIBC, IRON, RETICCTPCT in the last 72 hours. Sepsis Labs: Recent Labs  Lab 01/10/20 2016  LATICACIDVEN 1.3    Recent Results (from the past 240 hour(s))  Respiratory Panel by RT PCR (Flu A&B, Covid) - Nasopharyngeal Swab     Status: None   Collection Time: 01/10/20  7:58 PM   Specimen: Nasopharyngeal Swab  Result Value Ref Range Status   SARS Coronavirus 2 by RT PCR NEGATIVE NEGATIVE Final    Comment: (NOTE) SARS-CoV-2 target nucleic acids are NOT DETECTED.  The SARS-CoV-2 RNA is generally detectable in upper respiratoy specimens during the acute phase of infection. The lowest concentration of SARS-CoV-2 viral copies this assay can detect is 131 copies/mL. A negative result does not preclude SARS-Cov-2 infection and should not be used as the sole basis for treatment or other patient management decisions. A negative result may occur with  improper specimen collection/handling, submission of specimen other than nasopharyngeal swab, presence of viral mutation(s) within the areas targeted by this assay, and inadequate number of viral copies (<131 copies/mL). A negative result must be combined with clinical observations, patient history, and epidemiological information. The expected  result is Negative.  Fact Sheet for Patients:  PinkCheek.be  Fact Sheet for Healthcare Providers:  GravelBags.it  This test is no t yet approved or cleared by the [place] FDA and  has been authorized for detection and/or diagnosis of SARS-CoV-2 by FDA under an Emergency Use Authorization (EUA). This EUA will remain  in effect (meaning this test can be used) for the duration of the COVID-19 declaration under Section 564(b)(1) of the Act, 21 U.S.C. section 360bbb-3(b)(1), unless the authorization is terminated or revoked sooner.     Influenza A by PCR NEGATIVE NEGATIVE Final  Influenza B by PCR NEGATIVE NEGATIVE Final    Comment: (NOTE) The Xpert Xpress SARS-CoV-2/FLU/RSV assay is intended as an aid in  the diagnosis of influenza from Nasopharyngeal swab specimens and  should not be used as a sole basis for treatment. Nasal washings and  aspirates are unacceptable for Xpert Xpress SARS-CoV-2/FLU/RSV  testing.  Fact Sheet for Patients: PinkCheek.be  Fact Sheet for Healthcare Providers: GravelBags.it  This test is not yet approved or cleared by the [place] FDA and  has been authorized for detection and/or diagnosis of SARS-CoV-2 by  FDA under an Emergency Use Authorization (EUA). This EUA will remain  in effect (meaning this test can be used) for the duration of the  Covid-19 declaration under Section 564(b)(1) of the Act, 21  U.S.C. section 360bbb-3(b)(1), unless the authorization is  terminated or revoked. Performed at [facility], [place] [address], [place], [place] [address]   Blood culture (routine x 2)     Status: Abnormal (Preliminary result)   Collection Time: 01/10/20  8:16 PM   Specimen: BLOOD  Result Value Ref Range Status   Specimen Description BLOOD LEFT ANTECUBITAL  Final   Special Requests   Final    BOTTLES DRAWN AEROBIC AND  ANAEROBIC Blood Culture adequate volume   Culture  Setup Time   Final    GRAM NEGATIVE RODS IN BOTH AEROBIC AND ANAEROBIC BOTTLES Organism ID to follow CRITICAL RESULT CALLED TO, READ BACK BY AND VERIFIED WITH: PHARMD J MILLEN [phone] MLM    Culture (A)  Final    ESCHERICHIA COLI SUSCEPTIBILITIES TO FOLLOW Performed at [facility], [place] [address], [place], [place] [address]    Report Status PENDING  Incomplete  Blood culture (routine x 2)     Status: Abnormal (Preliminary result)   Collection Time: 01/10/20  8:16 PM   Specimen: BLOOD LEFT HAND  Result Value Ref Range Status   Specimen Description BLOOD LEFT HAND  Final   Special Requests   Final    BOTTLES DRAWN AEROBIC AND ANAEROBIC Blood Culture adequate volume   Culture  Setup Time   Final    GRAM NEGATIVE RODS IN BOTH AEROBIC AND ANAEROBIC BOTTLES CRITICAL VALUE NOTED.  VALUE IS CONSISTENT WITH PREVIOUSLY REPORTED AND CALLED VALUE. Performed at [facility], [place] [address] [place], [place] [address]    Culture ESCHERICHIA COLI (A)  Final   Report Status PENDING  Incomplete  Blood Culture ID Panel (Reflexed)     Status: Abnormal   Collection Time: 01/10/20  8:16 PM  Result Value Ref Range Status   Enterococcus faecalis NOT DETECTED NOT DETECTED Final   Enterococcus Faecium NOT DETECTED NOT DETECTED Final   Listeria monocytogenes NOT DETECTED NOT DETECTED Final   Staphylococcus species NOT DETECTED NOT DETECTED Final   Staphylococcus aureus (BCID) NOT DETECTED NOT DETECTED Final   Staphylococcus epidermidis NOT DETECTED NOT DETECTED Final   Staphylococcus lugdunensis NOT DETECTED NOT DETECTED Final   Streptococcus species NOT DETECTED NOT DETECTED Final   Streptococcus agalactiae NOT DETECTED NOT DETECTED Final   Streptococcus pneumoniae NOT DETECTED NOT DETECTED Final   Streptococcus pyogenes NOT DETECTED NOT DETECTED Final   A.calcoaceticus-baumannii NOT DETECTED NOT DETECTED Final   Bacteroides  fragilis NOT DETECTED NOT DETECTED Final   Enterobacterales DETECTED (A) NOT DETECTED Final    Comment: Enterobacterales represent a large order of gram negative bacteria, not a single organism. CRITICAL RESULT CALLED TO, READ BACK BY AND VERIFIED WITH:  PHARMD J MILLEN [phone] MLM    Enterobacter cloacae complex NOT DETECTED NOT DETECTED Final   Escherichia coli DETECTED (A) NOT DETECTED Final    Comment: CRITICAL RESULT CALLED TO, READ BACK BY AND VERIFIED WITH: PHARMD J MILLEN [phone] MLM    Klebsiella aerogenes NOT DETECTED NOT DETECTED Final   Klebsiella oxytoca NOT DETECTED NOT DETECTED Final   Klebsiella pneumoniae NOT DETECTED NOT DETECTED Final   Proteus species NOT DETECTED NOT DETECTED Final   Salmonella species NOT DETECTED NOT DETECTED Final   Serratia marcescens NOT DETECTED NOT DETECTED Final   Haemophilus influenzae NOT DETECTED NOT DETECTED Final   Neisseria meningitidis NOT DETECTED NOT DETECTED Final   Pseudomonas aeruginosa NOT DETECTED NOT DETECTED Final   Stenotrophomonas maltophilia NOT DETECTED NOT DETECTED Final   Candida albicans NOT DETECTED NOT DETECTED Final   Candida auris NOT DETECTED NOT DETECTED Final   Candida glabrata NOT DETECTED NOT DETECTED Final   Candida krusei NOT DETECTED NOT DETECTED Final   Candida parapsilosis NOT DETECTED NOT DETECTED Final   Candida tropicalis NOT DETECTED NOT DETECTED Final   Cryptococcus neoformans/gattii NOT DETECTED NOT DETECTED Final   CTX-M ESBL NOT DETECTED NOT DETECTED Final   Carbapenem resistance IMP NOT DETECTED NOT DETECTED Final   Carbapenem resistance KPC NOT DETECTED NOT DETECTED Final   Carbapenem resistance NDM NOT DETECTED NOT DETECTED Final   Carbapenem resist OXA 48 LIKE NOT DETECTED NOT DETECTED Final   Carbapenem resistance VIM NOT DETECTED NOT DETECTED Final    Comment: Performed at [facility], [place] [address], [place], [place] [address]  Urine culture     Status: Abnormal  (Preliminary result)   Collection Time: 01/10/20 11:55 PM   Specimen: Urine, Random  Result Value Ref Range Status   Specimen Description URINE, RANDOM  Final   Special Requests NONE  Final   Culture (A)  Final    >=100,000 COLONIES/mL ESCHERICHIA COLI SUSCEPTIBILITIES TO FOLLOW Performed at [facility], [place] [address] [place], [place] [address]    Report Status PENDING  Incomplete     Radiology Studies: MR Lumbar Spine W Wo Contrast  Result Date: 01/10/2020 CLINICAL DATA:  Lumbar radiculopathy. EXAM: MRI LUMBAR SPINE WITHOUT AND WITH CONTRAST TECHNIQUE: Multiplanar and multiecho pulse sequences of the lumbar spine were obtained without and with intravenous contrast. CONTRAST:  7.69m GADAVIST GADOBUTROL 1 MMOL/ML IV SOLN COMPARISON:  MRI 11/01/2019 FINDINGS: Segmentation:  [name]. Alignment: 2 mm of retrolisthesis of L2 on L3 and retrolisthesis of L3 on L4, similar to prior. Vertebrae:  No fracture, evidence of discitis, or bone lesion. Conus medullaris and cauda equina: Conus extends to the L1 level. Conus appears normal. Paraspinal and other soft tissues: No acute abnormality. Disc levels: T12-L1: No significant disc protrusion, foraminal stenosis, or canal stenosis. L1-L2: No significant disc protrusion, foraminal stenosis, or canal stenosis. L2-L3: Similar broad-based disc bulge with right lateral prominent disc osteophyte complex. Similar moderate right greater than left facet arthropathy. Resulting moderate right foraminal stenosis and right subarticular recess stenosis. Mild canal stenosis. L3-L4: Interval right micro discectomy with right laminotomy. Enhancing soft tissue in the right subarticular recess is consistent with scar tissue. There is significantly improved right subarticular recess stenosis. Bulky facet arthropathy results in mild right and severe left foraminal stenosis. No significant canal stenosis. L4-L5: Broad-based disc bulge with severe left and moderate right  facet hypertrophy. Bilateral subarticular recess stenosis. Severe left foraminal stenosis. Mild right foraminal stenosis. L5-S1: Severe  bilateral facet hypertrophy without significant canal or foraminal stenosis. IMPRESSION: 1. 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 There is enhancing scar tissue in this region. 2. Otherwise, similar multilevel degenerative change including severe left foraminal stenosis at L3-L4 and L4-L5 and moderate right foraminal and subarticular recess stenosis at L2-L3. Electronically Signed   By: Margaretha Sheffield MD   On: 01/10/2020 22:37   DG Chest Portable 1 View  Result Date: 01/10/2020 CLINICAL DATA:  Fever.  Recent back surgery. EXAM: PORTABLE CHEST 1 VIEW COMPARISON:  12/08/2019 FINDINGS: Heart size and pulmonary vascularity are normal. Shallow inspiration. Lungs are clear. No pleural effusions. No pneumothorax. Mediastinal contours appear intact. Degenerative changes in the spine and shoulders. IMPRESSION: Shallow inspiration. No evidence of active pulmonary disease. Electronically Signed   By: Lucienne Capers M.D.   On: 01/10/2020 22:36     LOS: 0 days   Antonieta Pert, MD [facility]  01/12/2020, 10:43 AM

## 2020-01-12 NOTE — Progress Notes (Signed)
Pt remained stable during shift after implementing MD orders. Pt HR noted to be going up to the 110's, MD on-call notified and new order received. 500 ml NS given, reported off to oncoming RN. Delia Heady RN

## 2020-01-12 NOTE — Progress Notes (Signed)
OT Cancellation Note  Patient Details Name: Anna [name] MRN: [id number] DOB: October 26, 1950   Cancelled Treatment:    Reason Eval/Treat Not Completed: Patient declined, no reason specified Pt sleeping for the first time and poor sleeping throughout the night per daughter in room. Daughter reports pt was able to walk to the bathroom today and requesting to let her rest at this time. Ot to check back as appropriate.   Billey Chang, OTR/L  Acute Rehabilitation Services Pager: [phone] Office: [phone] .  01/12/2020, 4:07 PM

## 2020-01-13 ENCOUNTER — Inpatient Hospital Stay (HOSPITAL_COMMUNITY): Payer: Medicare Other

## 2020-01-13 LAB — C DIFFICILE (CDIFF) QUICK SCRN (NO PCR REFLEX)
C Diff antigen: POSITIVE — AB
C Diff toxin: NEGATIVE

## 2020-01-13 LAB — BASIC METABOLIC PANEL
Anion gap: 7 (ref 5–15)
BUN: 13 mg/dL (ref 8–23)
CO2: 21 mmol/L — ABNORMAL LOW (ref 22–32)
Calcium: 7.9 mg/dL — ABNORMAL LOW (ref 8.9–10.3)
Chloride: 108 mmol/L (ref 98–111)
Creatinine, Ser: 1.11 mg/dL — ABNORMAL HIGH (ref 0.44–1.00)
GFR, Estimated: 53 mL/min — ABNORMAL LOW (ref >60)
Glucose, Bld: 82 mg/dL (ref 70–99)
Potassium: 3.2 mmol/L — ABNORMAL LOW (ref 3.5–5.1)
Sodium: 136 mmol/L (ref 135–145)

## 2020-01-13 LAB — CBC
HCT: 31.1 % — ABNORMAL LOW (ref 36.0–46.0)
Hemoglobin: 10.5 g/dL — ABNORMAL LOW (ref 12.0–15.0)
MCH: 29.7 pg (ref 26.0–34.0)
MCHC: 33.8 g/dL (ref 30.0–36.0)
MCV: 88.1 fL (ref 80.0–100.0)
Platelets: 199 10*3/uL (ref 150–400)
RBC: 3.53 MIL/uL — ABNORMAL LOW (ref 3.87–5.11)
RDW: 12.4 % (ref 11.5–15.5)
WBC: 13.7 10*3/uL — ABNORMAL HIGH (ref 4.0–10.5)
nRBC: 0 % (ref 0.0–0.2)

## 2020-01-13 LAB — CULTURE, BLOOD (ROUTINE X 2)
Special Requests: ADEQUATE
Special Requests: ADEQUATE

## 2020-01-13 LAB — URINE CULTURE: Culture: >=100000 — AB

## 2020-01-13 LAB — GLUCOSE, CAPILLARY
Glucose-Capillary: 136 mg/dL — ABNORMAL HIGH (ref 70–99)
Glucose-Capillary: 97 mg/dL (ref 70–99)
Glucose-Capillary: 97 mg/dL (ref 70–99)

## 2020-01-13 LAB — TROPONIN I (HIGH SENSITIVITY): Troponin I (High Sensitivity): 25 ng/L — ABNORMAL HIGH (ref <18)

## 2020-01-13 MED ORDER — CEFAZOLIN SODIUM-DEXTROSE 2-4 GM/100ML-% IV SOLN
2.0000 g | Freq: Three times a day (TID) | INTRAVENOUS | Status: DC
  Administered 2020-01-13 – 2020-01-16 (×9): 2 g via INTRAVENOUS
  Filled 2020-01-13 (×9): qty 100

## 2020-01-13 MED ORDER — ONDANSETRON HCL 4 MG/2ML IJ SOLN
4.0000 mg | Freq: Four times a day (QID) | INTRAMUSCULAR | Status: DC | PRN
  Administered 2020-01-13 – 2020-01-14 (×2): 4 mg via INTRAVENOUS
  Filled 2020-01-13 (×2): qty 2

## 2020-01-13 MED ORDER — POTASSIUM CHLORIDE CRYS ER 20 MEQ PO TBCR
40.0000 meq | EXTENDED_RELEASE_TABLET | Freq: Once | ORAL | Status: AC
  Administered 2020-01-13: 40 meq via ORAL
  Filled 2020-01-13: qty 2

## 2020-01-13 MED ORDER — ALUM & MAG HYDROXIDE-SIMETH 200-200-20 MG/5ML PO SUSP
30.0000 mL | Freq: Four times a day (QID) | ORAL | Status: DC | PRN
  Administered 2020-01-13 – 2020-01-15 (×3): 30 mL via ORAL
  Filled 2020-01-13 (×3): qty 30

## 2020-01-13 MED ORDER — TRAMADOL HCL 50 MG PO TABS
50.0000 mg | ORAL_TABLET | Freq: Four times a day (QID) | ORAL | Status: DC | PRN
  Administered 2020-01-13 – 2020-01-15 (×5): 50 mg via ORAL
  Filled 2020-01-13 (×6): qty 1

## 2020-01-13 NOTE — Evaluation (Signed)
Occupational Therapy Evaluation Patient Details Name: Anna [name] MRN: [id number] DOB: [birth year] Today's Date: 01/13/2020    History of Present Illness 69 y.o. female with medical history significant for fibromyalgia, hypertension, diabetes mellitus, chronic low back pain, restless leg syndrome, and previous back surgery presents to the emergency department for chief concerns of pain down her legs, difficulty walking, excessive urination, and diarrhea. In ED pt found to have  increased respiration, sinus tachycardia, normal low blood pressure, and SPO2 of 90 to 94% on room air. Pt admitted for observation 10/23 for treatment of possible pyelonephritis nephritis and encephalopathy   Clinical Impression   PT admitted with uti (+). Pt currently with functional limitiations due to the deficits listed below (see OT problem list). pt currently fatigues quickly and demonstrates executive cognitive deficits during session. Pt with safety concerns with RW as pt is abandoning it at times during session. Pt was able to don socks at EOB and increased time.  Pt will benefit from skilled OT to increase their independence and safety with adls and balance to allow discharge home.     Follow Up Recommendations  No OT follow up    Equipment Recommendations  Other (comment);3 in 1 bedside commode (RW)    Recommendations for Other Services       Precautions / Restrictions Precautions Precautions: Fall      Mobility Bed Mobility Overal bed mobility: Needs Assistance Bed Mobility: Supine to Sit;Sit to Supine     Supine to sit: Min assist Sit to supine: Min assist   General bed mobility comments: pt requires min (A) to progress to EOB with HOB 45 degrees and increased time. pt pausing and reports fatigue     Transfers Overall transfer level: Needs assistance Equipment used: Rolling walker (2 wheeled) Transfers: Sit to/from Stand Sit to Stand: Min guard         General transfer comment:  cues for safety with RW    Balance Overall balance assessment: Needs assistance Sitting-balance support: Bilateral upper extremity supported;Feet supported Sitting balance-Leahy Scale: Fair     Standing balance support: Single extremity supported;During functional activity Standing balance-Leahy Scale: Fair                             ADL either performed or assessed with clinical judgement   ADL Overall ADL's : Needs assistance/impaired     Grooming: Wash/dry hands;Min guard;Standing Grooming Details (indicate cue type and reason): cues for safety with RW             Lower Body Dressing: Supervision/safety Lower Body Dressing Details (indicate cue type and reason): static sitting eob with hip hike on bed edge  Toilet Transfer: Min Lobbyist Details (indicate cue type and reason): pt abandoned RW at the door way and progressed without RW Toileting- Water quality scientist and Hygiene: Min guard Toileting - Clothing Manipulation Details (indicate cue type and reason): requires max (A) for toilet paper due to difficulty reaching     Functional mobility during ADLs: Min guard;Rolling walker General ADL Comments: pt reports fatigue after being up 1 hour and requesting to return to supine.      Vision Baseline Vision/History: No visual deficits       Perception     Praxis      Pertinent Vitals/Pain Pain Assessment: Faces Faces Pain Scale: Hurts even more Pain Location: L LE Pain Descriptors / Indicators: Discomfort;Grimacing;Guarding Pain Intervention(s): Monitored during session;Repositioned  Hand Dominance Right   Extremity/Trunk Assessment Upper Extremity Assessment Upper Extremity Assessment: Overall WFL for tasks assessed   Lower Extremity Assessment RLE Deficits / Details: edema noted  LLE Deficits / Details: edema noted and yellign with any tactile input to the leg   Cervical / Trunk Assessment Cervical / Trunk  Assessment: Other exceptions Cervical / Trunk Exceptions: hx of back pain   Communication Communication Communication: No difficulties   Cognition Arousal/Alertness: Awake/alert Behavior During Therapy: WFL for tasks assessed/performed Overall Cognitive Status: Impaired/Different from baseline Area of Impairment: Safety/judgement;Problem solving                         Safety/Judgement: Decreased awareness of deficits   Problem Solving: Slow processing;Difficulty sequencing General Comments: pt reports having surgery 6 weeks ago and then having a second surgery on her back and then coming here. Pt with original surgery 1 year ago per daughter and pt states "yes that right" pt is aware of admission being due to UTI after additional questioning to direct answer further   General Comments  reports diarrhea prior to OT arrival and feeling like stomach was going to be "upset" pt only peed for void    Exercises     Shoulder Instructions      Home Living Family/patient expects to be discharged to:: Private residence Living Arrangements: Spouse/significant other Available Help at Discharge: Family;Available 24 hours/day Type of Home: House Home Access: Stairs to enter [employer] of Steps: 4 Entrance Stairs-Rails: Left Home Layout: Multi-level;Able to live on main level with bedroom/bathroom     Bathroom Shower/Tub: Occupational psychologist: Standard Bathroom Accessibility: Yes   Home Equipment: Grab bars - tub/shower;Toilet riser;Grab bars - toilet;Walker - standard   Additional Comments: has daughter present. reports sister staying with her prior to admission      Prior Functioning/Environment Level of Independence: Needs assistance  Gait / Transfers Assistance Needed: ambluates household distances without AD ADL's / Homemaking Assistance Needed: independent in ADLs, assist for iADLs            OT Problem List: Decreased activity  tolerance;Impaired balance (sitting and/or standing);Decreased cognition;Decreased safety awareness;Decreased knowledge of use of DME or AE;Decreased knowledge of precautions;Pain;Increased edema;Decreased strength      OT Treatment/Interventions: Self-care/ADL training;Therapeutic exercise;Energy conservation;DME and/or AE instruction;Manual therapy;Therapeutic activities;Cognitive remediation/compensation;Patient/family education;Balance training    OT Goals(Current goals can be found in the care plan section) Acute Rehab OT Goals Patient Stated Goal: why are my legs swelling like these OT Goal Formulation: With patient/family Time For Goal Achievement: 01/27/20 Potential to Achieve Goals: Good  OT Frequency: Min 2X/week   Barriers to D/C:            Co-evaluation              AM-PAC OT "6 Clicks" Daily Activity     Outcome Measure Help from another person eating meals?: None Help from another person taking care of personal grooming?: A Little Help from another person toileting, which includes using toliet, bedpan, or urinal?: A Little Help from another person bathing (including washing, rinsing, drying)?: A Little Help from another person to put on and taking off regular upper body clothing?: A Little Help from another person to put on and taking off regular lower body clothing?: A Little 6 Click Score: 19   End of Session Equipment Utilized During Treatment: Rolling walker Nurse Communication: Mobility status;Precautions  Activity Tolerance: Patient tolerated treatment well Patient  left: in bed;with call bell/phone within reach;with bed alarm set;with family/visitor present  OT Visit Diagnosis: Unsteadiness on feet (R26.81);Muscle weakness (generalized) (M62.81);Pain Pain - Right/Left: Left Pain - part of body: Leg                Time: 1100-1129 OT Time Calculation (min): 29 min Charges:  OT General Charges $OT Visit: 1 Visit OT Evaluation $OT Eval Moderate  Complexity: 1 Mod OT Treatments $Self Care/Home Management : 8-22 mins   Brynn, OTR/L  Acute Rehabilitation Services Pager: [phone] Office: [phone] .   Jeri Modena 01/13/2020, 2:33 PM

## 2020-01-13 NOTE — Progress Notes (Signed)
PT Cancellation Note  Patient Details Name: Anna [name] MRN: [id number] DOB: 14-Jul-1950   Cancelled Treatment:    Reason Eval/Treat Not Completed: Fatigue/lethargy limiting ability to participate Pt reports feeling tired after being up earlier with OT declining mobility. Will follow.   Marguarite Arbour A Gwendalyn Mcgonagle 01/13/2020, 1:51 PM Marisa Severin, PT, DPT Acute Rehabilitation Services Pager [phone] Office [phone]

## 2020-01-13 NOTE — Progress Notes (Signed)
PROGRESS NOTE    Anna [name]  OMA:[id number] DOB: 04-08-1950 DOA: 01/10/2020 PCP: Leanna Battles, MD   Chief Complaint  Patient presents with  . Back Pain   Brief Narrative: 3 yof with history of fibromyalgia, hypertension, diabetes mellitus, chronic low back pain, restless leg syndrome, recent lumbar laminectomy/decompression micro discectomy on 12/10/2027 by Dr. Sherley Bounds presented with 2 to 3 days history of pain down her legs, difficulty walking, excessive urination and diarrhea.  Patient reported there was fever but unclear what the T-max was, no fall no headache vision changes trouble swallowing cough.  Patient had urgency frequency of urination some shortness of breath as well as numbness down her legs. Patient was seen in the ED, had difficulty speaking was alert and oriented to self, age current location current year. 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  There is enhancing scar tissue in this region.  Similar multilevel degenerative changes including severe left foraminal stenosis at L3-L4 and L4-L5 and moderate right foraminal and subarticular recess stenosis at L2 to L3.  Portable chest x-ray was also ordered and showed no radiological evidence of active pulmonary disease". Patient found to have E coli UTI and sepsis/bacteremia  Subjective:  No more fever episode.  Leukocytosis downtrending.   Assessment & Plan:  E. coli Sepsis POA due to E. coli UTI/pyelonephritis.  Patient met sepsis criteria with fever more than 100.4, tachycardia more than 100 and source at UTI.  Urine culture E. coli sensitive to ceftriaxone, blood culture sensitivity pending. Cont Cefazolin 2 g daily.Follow-up fever curve, vital signs   E. coli pyelonephritis:plan per #1.  Difficulty with ambulation/imbalance/excessive urination/change in behavior acute metabolic  encephalopathy at home: Patient not confused in the ED. mental status significantly improved.  Seen by medication and has canceled MRI brain as he feels all of a sudden less likely NPH.continue UTI treatment continue PT OT.    Recent lumbar laminectomy/decompression micro discectomy on 12/10/2027 by Dr. Sherley Bounds: MRI lumbar spine on admission showing improvement, neurosurgery was consulted by EDP. Sed rate and CRP elevated likely due to UTI and sepsis.MRI lumbar spine no acute finding neurosurgery consulted and no further recommendation from neurosurgery.  Multiple episodes of diarrhea/leukocytosis ruling out C. Difficile on admission, does have some mild abdominal discomfort but has diffuse pain has fibromyalgia.  C Diff pending.  AKI likely from GI/diarrhea.Resolved, wean ivf.  Metabolic acidosis in the setting of AKI, GI loss . Resolving  HypoKalemia: Replete again  Hyponatremia:Sodium improved.   T2DM, blood sugar is controlled. Cont ssi. hba1c poorly controlled 9.2 on 12/08/19. Pt with Excessive urination at night. Recent Labs  Lab 01/11/20 1239 01/11/20 1708 01/11/20 2122 01/12/20 1222 01/12/20 1647  GLUCAP 130* 137* 140* 128* 109*   Fibromyalgia patient reports she is on Neurontin but did not respond well.  Diffuse pain bilateral chest/back/ abdomen:  this morning obtain chest x-ray and troponin for chest pain- EKG was nonischemic.add muscle relaxant, cont Tylenol reluctant to use opiates but I have added tramadol in case of severe pain, continue Maalox  Diet Order            DIET SOFT Room service appropriate? Yes; Fluid consistency: Thin  Diet effective now                Body mass index is 27.28 kg/m. DVT prophylaxis: enoxaparin (LOVENOX) injection 40 mg Start: 01/12/20 0945 Place and  maintain sequential compression device Start: 01/12/20 0856 Place TED hose Start: 01/11/20 0148 Code Status:   Code Status: Full Code  Family Communication: plan of care discussed  with patient and patient's daughter at bedside.  Status is: admitted as Observation Remains hospitalized for ongoing management of E. coli sepsis bacteremia UTI and was changed to inpatient status Dispo:The patient is from: Home            Anticipated d/c is to: Home w/ [facility]            Anticipated d/c date is: 1-2 days            Patient currently is not medically stable to d/c.  Consultants:see note  Procedures:see note  Culture/Microbiology    Component Value Date/Time   SDES URINE, RANDOM 01/10/2020 2355   SPECREQUEST  01/10/2020 2355    NONE Performed at [facility], [place] [address] [place], [place] [address]    CULT >=100,000 COLONIES/mL ESCHERICHIA COLI (A) 01/10/2020 2355   REPTSTATUS 01/13/2020 FINAL 01/10/2020 2355    Other culture-see note  Medications: Scheduled Meds: . amLODipine  5 mg Oral Daily   And  . irbesartan  150 mg Oral Daily  . enoxaparin (LOVENOX) injection  40 mg Subcutaneous Q24H  . insulin aspart  0-9 Units Subcutaneous TID WC   Continuous Infusions: .  ceFAZolin (ANCEF) IV    . lactated ringers 50 mL/hr at 01/13/20 1043   Antimicrobials: Anti-infectives (From admission, onward)   Start     Dose/Rate Route Frequency Ordered Stop   01/13/20 2200  ceFAZolin (ANCEF) IVPB 2g/100 mL premix        2 g 200 mL/hr over 30 Minutes Intravenous Every 8 hours 01/13/20 0930     01/11/20 2200  cefTRIAXone (ROCEPHIN) 1 g in sodium chloride 0.9 % 100 mL IVPB  Status:  Discontinued        1 g 200 mL/hr over 30 Minutes Intravenous Every 24 hours 01/11/20 0200 01/11/20 0827   01/11/20 2200  cefTRIAXone (ROCEPHIN) 2 g in sodium chloride 0.9 % 100 mL IVPB  Status:  Discontinued        2 g 200 mL/hr over 30 Minutes Intravenous Every 24 hours 01/11/20 0827 01/13/20 0930   01/11/20 0045  cefTRIAXone (ROCEPHIN) 2 g in sodium chloride 0.9 % 100 mL IVPB        2 g 200 mL/hr over 30 Minutes Intravenous  Once 01/11/20 0042 01/11/20 0123      Objective: Vitals: Today's Vitals   01/13/20 0300 01/13/20 0400 01/13/20 0705 01/13/20 0913  BP:  (!) 151/69    Pulse:  81    Resp:  20    Temp:  99 F (37.2 C)    TempSrc:  Oral    SpO2:  96%    Weight:      Height:      PainSc: 0-No pain 0-No pain 7  4     Intake/Output Summary (Last 24 hours) at 01/13/2020 1142 Last data filed at 01/13/2020 0500 Gross per 24 hour  Intake 588.33 ml  Output 1600 ml  Net -1011.67 ml   Filed Weights   01/11/20 0409  Weight: 72.1 kg   Weight change:   Intake/Output from previous day: 10/25 0701 - 10/26 0700 In: 2129.2 [P.O.:100; I.V.:1426.5; IV Piggyback:602.7] Out: 2700 [Urine:2700] Intake/Output this shift: No intake/output data recorded.  Examination: General exam: AAOx3 , NAD, weak appearing. HEENT:Oral mucosa moist, Ear/Nose WNL grossly, dentition normal. Respiratory system:  bilaterally clear,no wheezing or crackles,no use of accessory muscle Cardiovascular system: S1 & S2 +, No JVD,. Gastrointestinal system: Abdomen soft, NT,ND, BS+ Nervous System:Alert, awake, moving extremities and grossly nonfocal Extremities: No edema, distal peripheral pulses palpable.  Skin: No rashes,no icterus. MSK: Normal muscle bulk,tone, power Tender to palpation on bilateral chest or lower back no specific point tenderness  Data Reviewed: I have personally reviewed following labs and imaging studies CBC: Recent Labs  Lab 01/10/20 2016 01/11/20 0330 01/12/20 0446 01/13/20 0507  WBC 18.5* 16.6* 17.5* 13.7*  NEUTROABS 16.2*  --   --   --   HGB 14.1 12.1 13.1 10.5*  HCT 41.8 36.6 39.5 31.1*  MCV 88.2 90.1 92.5 88.1  PLT 220 179 164 330   Basic Metabolic Panel: Recent Labs  Lab 01/10/20 2016 01/11/20 0330 01/12/20 0446 01/13/20 0507  NA 130* 132* 134* 136  K 3.7 3.4* 4.4 3.2*  CL 95* 101 106 108  CO2 22 19* 17* 21*  GLUCOSE 321* 206* 99 82  BUN 26* 24* 18 13  CREATININE 1.51* 1.45* 1.29* 1.11*  CALCIUM 9.5 8.0* 8.2* 7.9*    GFR: Estimated Creatinine Clearance: 45.3 mL/min (A) (by C-G formula based on SCr of 1.11 mg/dL (H)). Liver Function Tests: Recent Labs  Lab 01/10/20 2016 01/11/20 0330  AST 13* 12*  ALT 16 14  ALKPHOS 98 87  BILITOT 1.2 0.6  PROT 6.5 5.4*  ALBUMIN 2.5* 1.9*   Recent Labs  Lab 01/10/20 2016  LIPASE 16   No results for input(s): AMMONIA in the last 168 hours. Coagulation Profile: No results for input(s): INR, PROTIME in the last 168 hours. Cardiac Enzymes: No results for input(s): CKTOTAL, CKMB, CKMBINDEX, TROPONINI in the last 168 hours. BNP (last 3 results) No results for input(s): PROBNP in the last 8760 hours. HbA1C: Recent Labs    01/11/20 0330  HGBA1C 10.2*   CBG: Recent Labs  Lab 01/11/20 1239 01/11/20 1708 01/11/20 2122 01/12/20 1222 01/12/20 1647  GLUCAP 130* 137* 140* 128* 109*   Lipid Profile: No results for input(s): CHOL, HDL, LDLCALC, TRIG, CHOLHDL, LDLDIRECT in the last 72 hours. Thyroid Function Tests: No results for input(s): TSH, T4TOTAL, FREET4, T3FREE, THYROIDAB in the last 72 hours. Anemia Panel: No results for input(s): VITAMINB12, FOLATE, FERRITIN, TIBC, IRON, RETICCTPCT in the last 72 hours. Sepsis Labs: Recent Labs  Lab 01/10/20 2016  LATICACIDVEN 1.3    Recent Results (from the past 240 hour(s))  Respiratory Panel by RT PCR (Flu A&B, Covid) - Nasopharyngeal Swab     Status: None   Collection Time: 01/10/20  7:58 PM   Specimen: Nasopharyngeal Swab  Result Value Ref Range Status   SARS Coronavirus 2 by RT PCR NEGATIVE NEGATIVE Final    Comment: (NOTE) SARS-CoV-2 target nucleic acids are NOT DETECTED.  The SARS-CoV-2 RNA is generally detectable in upper respiratoy specimens during the acute phase of infection. The lowest concentration of SARS-CoV-2 viral copies this assay can detect is 131 copies/mL. A negative result does not preclude SARS-Cov-2 infection and should not be used as the sole basis for treatment or other  patient management decisions. A negative result may occur with  improper specimen collection/handling, submission of specimen other than nasopharyngeal swab, presence of viral mutation(s) within the areas targeted by this assay, and inadequate number of viral copies (<131 copies/mL). A negative result must be combined with clinical observations, patient history, and epidemiological information. The expected result is Negative.  Fact Sheet for Patients:  PinkCheek.be  Fact Sheet for Healthcare Providers:  GravelBags.it  This test is no t yet approved or cleared by the [place] FDA and  has been authorized for detection and/or diagnosis of SARS-CoV-2 by FDA under an Emergency Use Authorization (EUA). This EUA will remain  in effect (meaning this test can be used) for the duration of the COVID-19 declaration under Section 564(b)(1) of the Act, 21 U.S.C. section 360bbb-3(b)(1), unless the authorization is terminated or revoked sooner.     Influenza A by PCR NEGATIVE NEGATIVE Final   Influenza B by PCR NEGATIVE NEGATIVE Final    Comment: (NOTE) The Xpert Xpress SARS-CoV-2/FLU/RSV assay is intended as an aid in  the diagnosis of influenza from Nasopharyngeal swab specimens and  should not be used as a sole basis for treatment. Nasal washings and  aspirates are unacceptable for Xpert Xpress SARS-CoV-2/FLU/RSV  testing.  Fact Sheet for Patients: PinkCheek.be  Fact Sheet for Healthcare Providers: GravelBags.it  This test is not yet approved or cleared by the [place] FDA and  has been authorized for detection and/or diagnosis of SARS-CoV-2 by  FDA under an Emergency Use Authorization (EUA). This EUA will remain  in effect (meaning this test can be used) for the duration of the  Covid-19 declaration under Section 564(b)(1) of the Act, 21  U.S.C. section  360bbb-3(b)(1), unless the authorization is  terminated or revoked. Performed at [facility], [place] [address], [place], [place] [address]   Blood culture (routine x 2)     Status: Abnormal   Collection Time: 01/10/20  8:16 PM   Specimen: BLOOD  Result Value Ref Range Status   Specimen Description BLOOD LEFT ANTECUBITAL  Final   Special Requests   Final    BOTTLES DRAWN AEROBIC AND ANAEROBIC Blood Culture adequate volume   Culture  Setup Time   Final    GRAM NEGATIVE RODS IN BOTH AEROBIC AND ANAEROBIC BOTTLES CRITICAL RESULT CALLED TO, READ BACK BY AND VERIFIED WITH: Annabella [phone] MLM Performed at [facility], [place] [address], [place],  [address]    Culture ESCHERICHIA COLI (A)  Final   Report Status 01/13/2020 FINAL  Final   Organism ID, Bacteria ESCHERICHIA COLI  Final      Susceptibility   Escherichia coli - MIC*    AMPICILLIN >=32 RESISTANT Resistant     CEFAZOLIN <=4 SENSITIVE Sensitive     CEFEPIME <=0.12 SENSITIVE Sensitive     CEFTAZIDIME <=1 SENSITIVE Sensitive     CEFTRIAXONE <=0.25 SENSITIVE Sensitive     CIPROFLOXACIN <=0.25 SENSITIVE Sensitive     GENTAMICIN <=1 SENSITIVE Sensitive     IMIPENEM <=0.25 SENSITIVE Sensitive     TRIMETH/SULFA <=20 SENSITIVE Sensitive     AMPICILLIN/SULBACTAM 16 INTERMEDIATE Intermediate     PIP/TAZO <=4 SENSITIVE Sensitive     * ESCHERICHIA COLI  Blood culture (routine x 2)     Status: Abnormal   Collection Time: 01/10/20  8:16 PM   Specimen: BLOOD LEFT HAND  Result Value Ref Range Status   Specimen Description BLOOD LEFT HAND  Final   Special Requests   Final    BOTTLES DRAWN AEROBIC AND ANAEROBIC Blood Culture adequate volume   Culture  Setup Time   Final    GRAM NEGATIVE RODS IN BOTH AEROBIC AND ANAEROBIC BOTTLES CRITICAL VALUE NOTED.  VALUE IS CONSISTENT WITH PREVIOUSLY REPORTED AND CALLED VALUE.    Culture (A)  Final    ESCHERICHIA COLI SUSCEPTIBILITIES PERFORMED ON  PREVIOUS CULTURE WITHIN  THE LAST 5 DAYS. Performed at [facility], [place] [address] [place], [place] [address]    Report Status 01/13/2020 FINAL  Final  Blood Culture ID Panel (Reflexed)     Status: Abnormal   Collection Time: 01/10/20  8:16 PM  Result Value Ref Range Status   Enterococcus faecalis NOT DETECTED NOT DETECTED Final   Enterococcus Faecium NOT DETECTED NOT DETECTED Final   Listeria monocytogenes NOT DETECTED NOT DETECTED Final   Staphylococcus species NOT DETECTED NOT DETECTED Final   Staphylococcus aureus (BCID) NOT DETECTED NOT DETECTED Final   Staphylococcus epidermidis NOT DETECTED NOT DETECTED Final   Staphylococcus lugdunensis NOT DETECTED NOT DETECTED Final   Streptococcus species NOT DETECTED NOT DETECTED Final   Streptococcus agalactiae NOT DETECTED NOT DETECTED Final   Streptococcus pneumoniae NOT DETECTED NOT DETECTED Final   Streptococcus pyogenes NOT DETECTED NOT DETECTED Final   A.calcoaceticus-baumannii NOT DETECTED NOT DETECTED Final   Bacteroides fragilis NOT DETECTED NOT DETECTED Final   Enterobacterales DETECTED (A) NOT DETECTED Final    Comment: Enterobacterales represent a large order of gram negative bacteria, not a single organism. CRITICAL RESULT CALLED TO, READ BACK BY AND VERIFIED WITH: PHARMD J MILLEN [phone] MLM    Enterobacter cloacae complex NOT DETECTED NOT DETECTED Final   Escherichia coli DETECTED (A) NOT DETECTED Final    Comment: CRITICAL RESULT CALLED TO, READ BACK BY AND VERIFIED WITH: PHARMD J MILLEN [phone] MLM    Klebsiella aerogenes NOT DETECTED NOT DETECTED Final   Klebsiella oxytoca NOT DETECTED NOT DETECTED Final   Klebsiella pneumoniae NOT DETECTED NOT DETECTED Final   Proteus species NOT DETECTED NOT DETECTED Final   Salmonella species NOT DETECTED NOT DETECTED Final   Serratia marcescens NOT DETECTED NOT DETECTED Final   Haemophilus influenzae NOT DETECTED NOT DETECTED Final   Neisseria meningitidis NOT DETECTED NOT DETECTED Final    Pseudomonas aeruginosa NOT DETECTED NOT DETECTED Final   Stenotrophomonas maltophilia NOT DETECTED NOT DETECTED Final   Candida albicans NOT DETECTED NOT DETECTED Final   Candida auris NOT DETECTED NOT DETECTED Final   Candida glabrata NOT DETECTED NOT DETECTED Final   Candida krusei NOT DETECTED NOT DETECTED Final   Candida parapsilosis NOT DETECTED NOT DETECTED Final   Candida tropicalis NOT DETECTED NOT DETECTED Final   Cryptococcus neoformans/gattii NOT DETECTED NOT DETECTED Final   CTX-M ESBL NOT DETECTED NOT DETECTED Final   Carbapenem resistance IMP NOT DETECTED NOT DETECTED Final   Carbapenem resistance KPC NOT DETECTED NOT DETECTED Final   Carbapenem resistance NDM NOT DETECTED NOT DETECTED Final   Carbapenem resist OXA 48 LIKE NOT DETECTED NOT DETECTED Final   Carbapenem resistance VIM NOT DETECTED NOT DETECTED Final    Comment: Performed at [facility], [address], [place], [place] [address]  Urine culture     Status: Abnormal   Collection Time: 01/10/20 11:55 PM   Specimen: Urine, Random  Result Value Ref Range Status   Specimen Description URINE, RANDOM  Final   Special Requests   Final    NONE Performed at [facility], [place] [address], [place], [place] [address]    Culture >=100,000 COLONIES/mL ESCHERICHIA COLI (A)  Final   Report Status 01/13/2020 FINAL  Final   Organism ID, Bacteria ESCHERICHIA COLI (A)  Final      Susceptibility   Escherichia coli - MIC*    AMPICILLIN >=32 RESISTANT Resistant     CEFAZOLIN <=4  SENSITIVE Sensitive     CEFTRIAXONE <=0.25 SENSITIVE Sensitive     CIPROFLOXACIN <=0.25 SENSITIVE Sensitive     GENTAMICIN <=1 SENSITIVE Sensitive     IMIPENEM <=0.25 SENSITIVE Sensitive     NITROFURANTOIN <=16 SENSITIVE Sensitive     TRIMETH/SULFA <=20 SENSITIVE Sensitive     AMPICILLIN/SULBACTAM 16 INTERMEDIATE Intermediate     PIP/TAZO <=4 SENSITIVE Sensitive     * >=100,000 COLONIES/mL ESCHERICHIA COLI     Radiology  Studies: DG Chest Port 1 View  Result Date: 01/13/2020 CLINICAL DATA:  Chest pain EXAM: PORTABLE CHEST 1 VIEW COMPARISON:  None. FINDINGS: The heart size and mediastinal contours are within normal limits. Low lung volumes. Minimal density at the right lung base probably reflects atelectasis. There is mild elevation of the right hemidiaphragm. No pleural effusion. No pneumothorax. IMPRESSION: Minimal density at the right lung base probably reflects atelectasis. Electronically Signed   By: Macy Mis M.D.   On: 01/13/2020 09:24     LOS: 1 day   Antonieta Pert, MD [facility]  01/13/2020, 11:42 AM

## 2020-01-14 ENCOUNTER — Inpatient Hospital Stay (HOSPITAL_COMMUNITY): Payer: Medicare Other

## 2020-01-14 LAB — GLUCOSE, CAPILLARY
Glucose-Capillary: 125 mg/dL — ABNORMAL HIGH (ref 70–99)
Glucose-Capillary: 146 mg/dL — ABNORMAL HIGH (ref 70–99)

## 2020-01-14 LAB — CBC
HCT: 31.6 % — ABNORMAL LOW (ref 36.0–46.0)
Hemoglobin: 10.5 g/dL — ABNORMAL LOW (ref 12.0–15.0)
MCH: 29.3 pg (ref 26.0–34.0)
MCHC: 33.2 g/dL (ref 30.0–36.0)
MCV: 88.3 fL (ref 80.0–100.0)
Platelets: 260 10*3/uL (ref 150–400)
RBC: 3.58 MIL/uL — ABNORMAL LOW (ref 3.87–5.11)
RDW: 12.6 % (ref 11.5–15.5)
WBC: 13.3 10*3/uL — ABNORMAL HIGH (ref 4.0–10.5)
nRBC: 0 % (ref 0.0–0.2)

## 2020-01-14 LAB — BASIC METABOLIC PANEL
Anion gap: 8 (ref 5–15)
BUN: 11 mg/dL (ref 8–23)
CO2: 22 mmol/L (ref 22–32)
Calcium: 7.9 mg/dL — ABNORMAL LOW (ref 8.9–10.3)
Chloride: 106 mmol/L (ref 98–111)
Creatinine, Ser: 1.11 mg/dL — ABNORMAL HIGH (ref 0.44–1.00)
GFR, Estimated: 53 mL/min — ABNORMAL LOW (ref >60)
Glucose, Bld: 82 mg/dL (ref 70–99)
Potassium: 3.3 mmol/L — ABNORMAL LOW (ref 3.5–5.1)
Sodium: 136 mmol/L (ref 135–145)

## 2020-01-14 LAB — TROPONIN I (HIGH SENSITIVITY): Troponin I (High Sensitivity): 28 ng/L — ABNORMAL HIGH (ref <18)

## 2020-01-14 MED ORDER — IOHEXOL 350 MG/ML SOLN
100.0000 mL | Freq: Once | INTRAVENOUS | Status: AC | PRN
  Administered 2020-01-14: 100 mL via INTRAVENOUS

## 2020-01-14 MED ORDER — MELATONIN 3 MG PO TABS
6.0000 mg | ORAL_TABLET | Freq: Every day | ORAL | Status: DC
  Administered 2020-01-14 – 2020-01-15 (×2): 6 mg via ORAL
  Filled 2020-01-14 (×2): qty 2

## 2020-01-14 MED ORDER — POTASSIUM CHLORIDE CRYS ER 20 MEQ PO TBCR
40.0000 meq | EXTENDED_RELEASE_TABLET | Freq: Once | ORAL | Status: AC
  Administered 2020-01-14: 40 meq via ORAL
  Filled 2020-01-14: qty 2

## 2020-01-14 NOTE — Progress Notes (Signed)
Occupational Therapy Treatment Patient Details Name: Anna [name] MRN: [id number] DOB: 07/28/1950 Today's Date: 01/14/2020    History of present illness 69 y.o. female with medical history significant for fibromyalgia, hypertension, diabetes mellitus, chronic low back pain, restless leg syndrome, and previous back surgery presents to the emergency department for chief concerns of pain down her legs, difficulty walking, excessive urination, and diarrhea. In ED pt found to have  increased respiration, sinus tachycardia, normal low blood pressure, and SPO2 of 90 to 94% on room air. Pt admitted for observation 10/23 for treatment of possible pyelonephritis nephritis and encephalopathy   OT comments  Pt progressing this session and sitting up in chair on arrival. Pt encouraged to keep up activity tolerance and states "I dont want back in that bed if I can help it." spouse present and demonstrates transfer with pt this session. Pt demonstrates adequate level for d/c home at this time from OT stand point.    Follow Up Recommendations  No OT follow up    Equipment Recommendations  Other (comment);3 in 1 bedside commode    Recommendations for Other Services      Precautions / Restrictions Precautions Precautions: Fall Restrictions Weight Bearing Restrictions: No       Mobility Bed Mobility Overal bed mobility: Needs Assistance Bed Mobility: Supine to Sit     Supine to sit: HOB elevated;Supervision     General bed mobility comments: oob in chair on arrival   Transfers Overall transfer level: Needs assistance Equipment used: Rolling walker (2 wheeled) Transfers: Sit to/from Stand Sit to Stand: Min guard              Balance Overall balance assessment: Needs assistance Sitting-balance support: No upper extremity supported;Feet supported Sitting balance-Leahy Scale: Poor Sitting balance - Comments: minA due to posterior lean when attempting to don socks Postural control:  Posterior lean Standing balance support: Bilateral upper extremity supported;During functional activity Standing balance-Leahy Scale: Poor Standing balance comment: reliant on UE support of RW                           ADL either performed or assessed with clinical judgement   ADL Overall ADL's : Needs assistance/impaired                         Toilet Transfer: Min guard;RW       Tub/ Shower Transfer: Therapist, occupational Details (indicate cue type and reason): spouse helping and educated on placement of RW Functional mobility during ADLs: Supervision/safety;Rolling walker General ADL Comments: discussion this session about hygiene to rule out any source of UTI(+) that would be changed environmentally to help prevent reoccurrence. Pt states "its embarrassing but something we have to talk about. I am glad we talked" Pt is using wet wipes after each bathroom trip, daily baths and wiping correctly . ( no pads or diapers in use) Spouse present for all education     Vision       Perception     Praxis      Cognition Arousal/Alertness: Awake/alert Behavior During Therapy: WFL for tasks assessed/performed Overall Cognitive Status: Impaired/Different from baseline Area of Impairment: Problem solving                             Problem Solving: Slow processing General Comments: pt needs redirection initially to engage and educated on increased  activity tolerance to help with progression toward goals. pt walking with purewick in place and lacks awareness to [place]. pt states all yes. pt encouraged to d/c during waking hours and pt states "lets not" pt is aware of bladder needs per pt but declines to d/c purewick        Exercises Exercises: General Lower Extremity General Exercises - Lower Extremity Ankle Circles/Pumps: AROM;Both;20 reps Gluteal Sets: AROM;Both;10 reps Long Arc Quad: AROM;Both;10 reps   Shoulder  Instructions       General Comments pt dizzy upon entering shower but resolves and not other event this session    Pertinent Vitals/ Pain       Pain Assessment: Faces Faces Pain Scale: Hurts a little bit Pain Location: low back Pain Descriptors / Indicators: Aching Pain Intervention(s): Monitored during session;Repositioned  Home Living                                          Prior Functioning/Environment              Frequency  Min 2X/week        Progress Toward Goals  OT Goals(current goals can now be found in the care plan section)  Progress towards OT goals: Progressing toward goals  Acute Rehab OT Goals Patient Stated Goal: to improve mobility and reduce LE swelling OT Goal Formulation: With patient/family Time For Goal Achievement: 01/27/20 Potential to Achieve Goals: Good ADL Goals Pt Will Perform Grooming: with modified independence;standing Pt Will Perform Lower Body Dressing: with modified independence;sit to/from stand Pt Will Transfer to Toilet: with modified independence;ambulating;bedside commode Additional ADL Goal #1: pt will complete bed mobility from 20 degrees or less mod I transfer as precursor to adls.  Plan Discharge plan remains appropriate    Co-evaluation                 AM-PAC OT "6 Clicks" Daily Activity     Outcome Measure   Help from another person eating meals?: None Help from another person taking care of personal grooming?: A Little Help from another person toileting, which includes using toliet, bedpan, or urinal?: A Little Help from another person bathing (including washing, rinsing, drying)?: A Little Help from another person to put on and taking off regular upper body clothing?: A Little Help from another person to put on and taking off regular lower body clothing?: A Little 6 Click Score: 19    End of Session Equipment Utilized During Treatment: Rolling walker  OT Visit Diagnosis:  Unsteadiness on feet (R26.81);Muscle weakness (generalized) (M62.81);Pain Pain - Right/Left: Left Pain - part of body: Leg   Activity Tolerance Patient tolerated treatment well   Patient Left in chair;with call bell/phone within reach;with chair alarm set;with family/visitor present;with nursing/sitter in room   Nurse Communication Mobility status;Precautions        Time: 5520-8022 OT Time Calculation (min): 20 min  Charges: OT General Charges $OT Visit: 1 Visit OT Treatments $Self Care/Home Management : 8-22 mins   Brynn, OTR/L  Acute Rehabilitation Services Pager: [phone] Office: [phone] .    Jeri Modena 01/14/2020, 12:22 PM

## 2020-01-14 NOTE — Progress Notes (Addendum)
PROGRESS NOTE    Anna [name]  IWP:[id number] DOB: October 27, 1950 DOA: 01/10/2020 PCP: Leanna Battles, MD   Chief Complaint  Patient presents with  . Back Pain   Brief Narrative: 1 yof with history of fibromyalgia, hypertension, diabetes mellitus, chronic low back pain, restless leg syndrome, recent lumbar laminectomy/decompression micro discectomy on 12/10/2027 by Dr. Sherley Bounds presented with 2 to 3 days history of pain down her legs, difficulty walking, excessive urination and diarrhea.  Patient reported there was fever but unclear what the T-max was, no fall no headache vision changes trouble swallowing cough.  Patient had urgency frequency of urination some shortness of breath as well as numbness down her legs. Patient was seen in the ED, had difficulty speaking was alert and oriented to self, age current location current year. 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  There is enhancing scar tissue in this region.  Similar multilevel degenerative changes including severe left foraminal stenosis at L3-L4 and L4-L5 and moderate right foraminal and subarticular recess stenosis at L2 to L3.  Portable chest x-ray was also ordered and showed no radiological evidence of active pulmonary disease".  She was admitted started on empiric antibiotics, stool C. difficile was ordered. Patient found to have E coli UTI and sepsis/bacteremia  Subjective:  No episodes of fever again.  WBC downtrending, at 13.3K. Had episode of loose stool 10/26 and sent for C. difficile test- positive for antigen negative for toxin No abdominal pain, or diarrhea this morning.  Resting on the bedside chair.  Husband in the room. Denies chest pain fever chills.  Assessment & Plan:  E. coli Sepsis POA due to E. coli UTI/pyelonephritis: Sensitive to cefazolin ceftriaxone Cipro, antibiotic  de-escalated to cefazolin.  No more recurrent fever.  E. coli pyelonephritis:plan per #1. Get CT Renal study.  Difficulty with ambulation/imbalance/excessive urination/change in behavior acute metabolic encephalopathy at home: Patient not confused in the ED. mental status significantly improved as per daughter, seen by neurosurgery and MRI brain was canceled as he feels all of a sudden less likely NPH. Continue UTI treatment continue PT OT.    Recent lumbar laminectomy/decompression micro discectomy on 12/10/2027 by Dr. Sherley Bounds: MRI lumbar spine on admission showing improvement, neurosurgery was consulted by EDP. Sed rate and CRP elevated likely due to UTI and sepsis.neurosurgery following, discussed with Dr. Ronnald Ramp 10/26.  Multiple episodes of diarrhea at home: Patient reports had bloody stool yesterday- C. difficile sent positive for toxin negative antigen-indeterminate, likley colonization-no diarrhea today- if has further diarrhea may need pcr screen vs Rx-can d/w C diff MD.  AKI likely from GI/diarrhea, resolved.  Wean off IV fluids slowly.  Metabolic acidosis in the setting of AKI/diarrhea:bicarb improved to 22.    HypoKalemia: Added potassium chloride po.  Hyponatremia: Resolved  T2DM, blood sugar controlled, A1c poorly controlled 9.2, continue sliding scale.  Recent Labs  Lab 01/12/20 1647 01/13/20 1227 01/13/20 1631 01/13/20 2139 01/14/20 1300  GLUCAP 109* 136* 97 97 125*   Fibromyalgia patient reports she is on Neurontin but did not respond well. Diffuse pain bilateral chest/back/ abdomen:  chest x-ray NAD, trop slightly up 25-28,EKG was nonischemic. Cont muscle relaxant,Tylenol, reluctant to use opiates but I have added tramadol in case of uncontrolled pain, added Maalox. ADDENDUM 18:43 Patient complaining of lower chest pain this evening again, had yesterday as well, and it is worse with deep breath, pleuritic in  nature she did have positive troponin, although not hypoxic  but given recent back surgery will rule out PE.  Discussed risks benefits alternatives of CTA with contrast including AKI and she would liek to proceed with a CTA.  will increase IV fluid hydration to minimize contrast nephropathy.  Diet Order            DIET SOFT Room service appropriate? Yes; Fluid consistency: Thin  Diet effective now                Body mass index is 27.28 kg/m. DVT prophylaxis: enoxaparin (LOVENOX) injection 40 mg Start: 01/12/20 0945 Place and maintain sequential compression device Start: 01/12/20 0856 Place TED hose Start: 01/11/20 0148 Code Status:   Code Status: Full Code  Family Communication: plan of care discussed with patient and patient's daughter at bedside.  Status is: admitted as Observation Remains hospitalized for ongoing management of E. coli sepsis bacteremia UTI and was changed to inpatient status Dispo:The patient is from: Home            Anticipated d/c is to: Home w/ HH continue to mobilize with PT OT.            Anticipated d/c date is: 1 day            Patient currently is not medically stable to d/c.  Consultants:see note  Procedures:see note  Culture/Microbiology    Component Value Date/Time   SDES URINE, RANDOM 01/10/2020 2355   SPECREQUEST  01/10/2020 2355    NONE Performed at [facility], [place] [address] [place], [place] [address]    CULT >=100,000 COLONIES/mL ESCHERICHIA COLI (A) 01/10/2020 2355   REPTSTATUS 01/13/2020 FINAL 01/10/2020 2355    Other culture-see note  Medications: Scheduled Meds: . amLODipine  5 mg Oral Daily   And  . irbesartan  150 mg Oral Daily  . enoxaparin (LOVENOX) injection  40 mg Subcutaneous Q24H  . insulin aspart  0-9 Units Subcutaneous TID WC   Continuous Infusions: .  ceFAZolin (ANCEF) IV 2 g (01/14/20 0549)  . lactated ringers Stopped (01/14/20 0548)   Antimicrobials: Anti-infectives (From admission, onward)   Start     Dose/Rate Route Frequency Ordered Stop   01/13/20 2200   ceFAZolin (ANCEF) IVPB 2g/100 mL premix        2 g 200 mL/hr over 30 Minutes Intravenous Every 8 hours 01/13/20 0930     01/11/20 2200  cefTRIAXone (ROCEPHIN) 1 g in sodium chloride 0.9 % 100 mL IVPB  Status:  Discontinued        1 g 200 mL/hr over 30 Minutes Intravenous Every 24 hours 01/11/20 0200 01/11/20 0827   01/11/20 2200  cefTRIAXone (ROCEPHIN) 2 g in sodium chloride 0.9 % 100 mL IVPB  Status:  Discontinued        2 g 200 mL/hr over 30 Minutes Intravenous Every 24 hours 01/11/20 0827 01/13/20 0930   01/11/20 0045  cefTRIAXone (ROCEPHIN) 2 g in sodium chloride 0.9 % 100 mL IVPB        2 g 200 mL/hr over 30 Minutes Intravenous  Once 01/11/20 0042 01/11/20 0123     Objective: Vitals: Today's Vitals   01/14/20 0649 01/14/20 0710 01/14/20 0802 01/14/20 1244  BP:   (!) 141/63 (!) 154/78  Pulse:   70 79  Resp:   18 18  Temp:   97.9 F (36.6 C) 98.5 F (36.9 C)  TempSrc:   Oral Oral  SpO2:   95%  96%  Weight:      Height:      PainSc: Asleep 0-No pain      Intake/Output Summary (Last 24 hours) at 01/14/2020 1354 Last data filed at 01/14/2020 0554 Gross per 24 hour  Intake 3115.21 ml  Output 2400 ml  Net 715.21 ml   Filed Weights   01/11/20 0409  Weight: 72.1 kg   Weight change:   Intake/Output from previous day: 10/26 0701 - 10/27 0700 In: 3115.2 [P.O.:400; I.V.:2615.2; IV Piggyback:100] Out: 2400 [Urine:2400] Intake/Output this shift: No intake/output data recorded.  Examination: General exam: AAO , NAD, weak appearing. HEENT:Oral mucosa moist, Ear/Nose WNL grossly, dentition normal. Respiratory system: bilaterally clear,no wheezing or crackles,no use of accessory muscle Cardiovascular system: S1 & S2 +, No JVD,. Gastrointestinal system: Abdomen soft, NT,ND, BS+ Nervous System:Alert, awake, moving extremities and grossly nonfocal Extremities: No edema, distal peripheral pulses palpable.  Skin: No rashes,no icterus. MSK: Normal muscle bulk,tone, power,  palpable reproducible tenderness multiple sites- chest back  Data Reviewed: I have personally reviewed following labs and imaging studies CBC: Recent Labs  Lab 01/10/20 2016 01/11/20 0330 01/12/20 0446 01/13/20 0507 01/14/20 0420  WBC 18.5* 16.6* 17.5* 13.7* 13.3*  NEUTROABS 16.2*  --   --   --   --   HGB 14.1 12.1 13.1 10.5* 10.5*  HCT 41.8 36.6 39.5 31.1* 31.6*  MCV 88.2 90.1 92.5 88.1 88.3  PLT 220 179 164 199 449   Basic Metabolic Panel: Recent Labs  Lab 01/10/20 2016 01/11/20 0330 01/12/20 0446 01/13/20 0507 01/14/20 0420  NA 130* 132* 134* 136 136  K 3.7 3.4* 4.4 3.2* 3.3*  CL 95* 101 106 108 106  CO2 22 19* 17* 21* 22  GLUCOSE 321* 206* 99 82 82  BUN 26* 24* 18 13 11   CREATININE 1.51* 1.45* 1.29* 1.11* 1.11*  CALCIUM 9.5 8.0* 8.2* 7.9* 7.9*   GFR: Estimated Creatinine Clearance: 45.3 mL/min (A) (by C-G formula based on SCr of 1.11 mg/dL (H)). Liver Function Tests: Recent Labs  Lab 01/10/20 2016 01/11/20 0330  AST 13* 12*  ALT 16 14  ALKPHOS 98 87  BILITOT 1.2 0.6  PROT 6.5 5.4*  ALBUMIN 2.5* 1.9*   Recent Labs  Lab 01/10/20 2016  LIPASE 16   No results for input(s): AMMONIA in the last 168 hours. Coagulation Profile: No results for input(s): INR, PROTIME in the last 168 hours. Cardiac Enzymes: No results for input(s): CKTOTAL, CKMB, CKMBINDEX, TROPONINI in the last 168 hours. BNP (last 3 results) No results for input(s): PROBNP in the last 8760 hours. HbA1C: No results for input(s): HGBA1C in the last 72 hours. CBG: Recent Labs  Lab 01/12/20 1647 01/13/20 1227 01/13/20 1631 01/13/20 2139 01/14/20 1300  GLUCAP 109* 136* 97 97 125*   Lipid Profile: No results for input(s): CHOL, HDL, LDLCALC, TRIG, CHOLHDL, LDLDIRECT in the last 72 hours. Thyroid Function Tests: No results for input(s): TSH, T4TOTAL, FREET4, T3FREE, THYROIDAB in the last 72 hours. Anemia Panel: No results for input(s): VITAMINB12, FOLATE, FERRITIN, TIBC, IRON,  RETICCTPCT in the last 72 hours. Sepsis Labs: Recent Labs  Lab 01/10/20 2016  LATICACIDVEN 1.3    Recent Results (from the past 240 hour(s))  Respiratory Panel by RT PCR (Flu A&B, Covid) - Nasopharyngeal Swab     Status: None   Collection Time: 01/10/20  7:58 PM   Specimen: Nasopharyngeal Swab  Result Value Ref Range Status   SARS Coronavirus 2 by RT PCR NEGATIVE NEGATIVE Final  Comment: (NOTE) SARS-CoV-2 target nucleic acids are NOT DETECTED.  The SARS-CoV-2 RNA is generally detectable in upper respiratoy specimens during the acute phase of infection. The lowest concentration of SARS-CoV-2 viral copies this assay can detect is 131 copies/mL. A negative result does not preclude SARS-Cov-2 infection and should not be used as the sole basis for treatment or other patient management decisions. A negative result may occur with  improper specimen collection/handling, submission of specimen other than nasopharyngeal swab, presence of viral mutation(s) within the areas targeted by this assay, and inadequate number of viral copies (<131 copies/mL). A negative result must be combined with clinical observations, patient history, and epidemiological information. The expected result is Negative.  Fact Sheet for Patients:  PinkCheek.be  Fact Sheet for Healthcare Providers:  GravelBags.it  This test is no t yet approved or cleared by the [place] FDA and  has been authorized for detection and/or diagnosis of SARS-CoV-2 by FDA under an Emergency Use Authorization (EUA). This EUA will remain  in effect (meaning this test can be used) for the duration of the COVID-19 declaration under Section 564(b)(1) of the Act, 21 U.S.C. section 360bbb-3(b)(1), unless the authorization is terminated or revoked sooner.     Influenza A by PCR NEGATIVE NEGATIVE Final   Influenza B by PCR NEGATIVE NEGATIVE Final    Comment: (NOTE) The Xpert  Xpress SARS-CoV-2/FLU/RSV assay is intended as an aid in  the diagnosis of influenza from Nasopharyngeal swab specimens and  should not be used as a sole basis for treatment. Nasal washings and  aspirates are unacceptable for Xpert Xpress SARS-CoV-2/FLU/RSV  testing.  Fact Sheet for Patients: PinkCheek.be  Fact Sheet for Healthcare Providers: GravelBags.it  This test is not yet approved or cleared by the [place] FDA and  has been authorized for detection and/or diagnosis of SARS-CoV-2 by  FDA under an Emergency Use Authorization (EUA). This EUA will remain  in effect (meaning this test can be used) for the duration of the  Covid-19 declaration under Section 564(b)(1) of the Act, 21  U.S.C. section 360bbb-3(b)(1), unless the authorization is  terminated or revoked. Performed at [facility], [place] [address], [place], [place] [address]   Blood culture (routine x 2)     Status: Abnormal   Collection Time: 01/10/20  8:16 PM   Specimen: BLOOD  Result Value Ref Range Status   Specimen Description BLOOD LEFT ANTECUBITAL  Final   Special Requests   Final    BOTTLES DRAWN AEROBIC AND ANAEROBIC Blood Culture adequate volume   Culture  Setup Time   Final    GRAM NEGATIVE RODS IN BOTH AEROBIC AND ANAEROBIC BOTTLES CRITICAL RESULT CALLED TO, READ BACK BY AND VERIFIED WITH: [place] [phone] [id number] Performed at [facility], [place] [address] [place], [place] [address]    Culture ESCHERICHIA COLI (A)  Final   Report Status 01/13/2020 FINAL  Final   Organism ID, Bacteria ESCHERICHIA COLI  Final      Susceptibility   Escherichia coli - MIC*    AMPICILLIN >=32 RESISTANT Resistant     CEFAZOLIN <=4 SENSITIVE Sensitive     CEFEPIME <=0.12 SENSITIVE Sensitive     CEFTAZIDIME <=1 SENSITIVE Sensitive     CEFTRIAXONE <=0.25 SENSITIVE Sensitive     CIPROFLOXACIN <=0.25 SENSITIVE Sensitive     GENTAMICIN <=1 SENSITIVE  Sensitive     IMIPENEM <=0.25 SENSITIVE Sensitive     TRIMETH/SULFA <=20 SENSITIVE Sensitive  AMPICILLIN/SULBACTAM 16 INTERMEDIATE Intermediate     PIP/TAZO <=4 SENSITIVE Sensitive     * ESCHERICHIA COLI  Blood culture (routine x 2)     Status: Abnormal   Collection Time: 01/10/20  8:16 PM   Specimen: BLOOD LEFT HAND  Result Value Ref Range Status   Specimen Description BLOOD LEFT HAND  Final   Special Requests   Final    BOTTLES DRAWN AEROBIC AND ANAEROBIC Blood Culture adequate volume   Culture  Setup Time   Final    GRAM NEGATIVE RODS IN BOTH AEROBIC AND ANAEROBIC BOTTLES CRITICAL VALUE NOTED.  VALUE IS CONSISTENT WITH PREVIOUSLY REPORTED AND CALLED VALUE.    Culture (A)  Final    ESCHERICHIA COLI SUSCEPTIBILITIES PERFORMED ON PREVIOUS CULTURE WITHIN THE LAST 5 DAYS. Performed at [facility], [place] [address] [place], [place] [address]    Report Status 01/13/2020 FINAL  Final  Blood Culture ID Panel (Reflexed)     Status: Abnormal   Collection Time: 01/10/20  8:16 PM  Result Value Ref Range Status   Enterococcus faecalis NOT DETECTED NOT DETECTED Final   Enterococcus Faecium NOT DETECTED NOT DETECTED Final   Listeria monocytogenes NOT DETECTED NOT DETECTED Final   Staphylococcus species NOT DETECTED NOT DETECTED Final   Staphylococcus aureus (BCID) NOT DETECTED NOT DETECTED Final   Staphylococcus epidermidis NOT DETECTED NOT DETECTED Final   Staphylococcus lugdunensis NOT DETECTED NOT DETECTED Final   Streptococcus species NOT DETECTED NOT DETECTED Final   Streptococcus agalactiae NOT DETECTED NOT DETECTED Final   Streptococcus pneumoniae NOT DETECTED NOT DETECTED Final   Streptococcus pyogenes NOT DETECTED NOT DETECTED Final   A.calcoaceticus-baumannii NOT DETECTED NOT DETECTED Final   Bacteroides fragilis NOT DETECTED NOT DETECTED Final   Enterobacterales DETECTED (A) NOT DETECTED Final    Comment: Enterobacterales represent a large order of gram negative  bacteria, not a single organism. CRITICAL RESULT CALLED TO, READ BACK BY AND VERIFIED WITH: PHARMD J MILLEN [phone] MLM    Enterobacter cloacae complex NOT DETECTED NOT DETECTED Final   Escherichia coli DETECTED (A) NOT DETECTED Final    Comment: CRITICAL RESULT CALLED TO, READ BACK BY AND VERIFIED WITH: PHARMD J MILLEN [phone] MLM    Klebsiella aerogenes NOT DETECTED NOT DETECTED Final   Klebsiella oxytoca NOT DETECTED NOT DETECTED Final   Klebsiella pneumoniae NOT DETECTED NOT DETECTED Final   Proteus species NOT DETECTED NOT DETECTED Final   Salmonella species NOT DETECTED NOT DETECTED Final   Serratia marcescens NOT DETECTED NOT DETECTED Final   Haemophilus influenzae NOT DETECTED NOT DETECTED Final   Neisseria meningitidis NOT DETECTED NOT DETECTED Final   Pseudomonas aeruginosa NOT DETECTED NOT DETECTED Final   Stenotrophomonas maltophilia NOT DETECTED NOT DETECTED Final   Candida albicans NOT DETECTED NOT DETECTED Final   Candida auris NOT DETECTED NOT DETECTED Final   Candida glabrata NOT DETECTED NOT DETECTED Final   Candida krusei NOT DETECTED NOT DETECTED Final   Candida parapsilosis NOT DETECTED NOT DETECTED Final   Candida tropicalis NOT DETECTED NOT DETECTED Final   Cryptococcus neoformans/gattii NOT DETECTED NOT DETECTED Final   CTX-M ESBL NOT DETECTED NOT DETECTED Final   Carbapenem resistance IMP NOT DETECTED NOT DETECTED Final   Carbapenem resistance KPC NOT DETECTED NOT DETECTED Final   Carbapenem resistance NDM NOT DETECTED NOT DETECTED Final   Carbapenem resist OXA 48 LIKE NOT DETECTED NOT DETECTED Final   Carbapenem resistance VIM NOT DETECTED NOT DETECTED Final  Comment: Performed at [facility], [place] [address] [place], [place] [address]  Urine culture     Status: Abnormal   Collection Time: 01/10/20 11:55 PM   Specimen: Urine, Random  Result Value Ref Range Status   Specimen Description URINE, RANDOM  Final   Special Requests   Final     NONE Performed at [facility], [place] [address], [place], [place] [address]    Culture >=100,000 COLONIES/mL ESCHERICHIA COLI (A)  Final   Report Status 01/13/2020 FINAL  Final   Organism ID, Bacteria ESCHERICHIA COLI (A)  Final      Susceptibility   Escherichia coli - MIC*    AMPICILLIN >=32 RESISTANT Resistant     CEFAZOLIN <=4 SENSITIVE Sensitive     CEFTRIAXONE <=0.25 SENSITIVE Sensitive     CIPROFLOXACIN <=0.25 SENSITIVE Sensitive     GENTAMICIN <=1 SENSITIVE Sensitive     IMIPENEM <=0.25 SENSITIVE Sensitive     NITROFURANTOIN <=16 SENSITIVE Sensitive     TRIMETH/SULFA <=20 SENSITIVE Sensitive     AMPICILLIN/SULBACTAM 16 INTERMEDIATE Intermediate     PIP/TAZO <=4 SENSITIVE Sensitive     * >=100,000 COLONIES/mL ESCHERICHIA COLI  C Difficile Quick Screen (NO PCR Reflex)     Status: Abnormal   Collection Time: 01/13/20 11:41 AM   Specimen: STOOL  Result Value Ref Range Status   C Diff antigen POSITIVE (A) NEGATIVE Final   C Diff toxin NEGATIVE NEGATIVE Final   C Diff interpretation   Final    Results are indeterminate. Please contact your campus specific ID physician or [place].    Comment: Performed at [facility], [place] [address], [place], [place] [address]     Radiology Studies: DG Chest Port 1 View  Result Date: 01/13/2020 CLINICAL DATA:  Chest pain EXAM: PORTABLE CHEST 1 VIEW COMPARISON:  None. FINDINGS: The heart size and mediastinal contours are within normal limits. Low lung volumes. Minimal density at the right lung base probably reflects atelectasis. There is mild elevation of the right hemidiaphragm. No pleural effusion. No pneumothorax. IMPRESSION: Minimal density at the right lung base probably reflects atelectasis. Electronically Signed   By: Macy Mis M.D.   On: 01/13/2020 09:24     LOS: 2 days   Antonieta Pert, MD [facility]  01/14/2020, 1:54 PM

## 2020-01-14 NOTE — Progress Notes (Signed)
Patient ID: Anna [name], female   DOB: 05/17/1950, 69 y.o.   MRN: [id number] Made courtesy rounds on her today. Spoke with her and informed her husband of mri findings (that it looks quite good). Her pain seems well controlled at present. I cancelled her outpt mri for today and her f/u appt tomorrow and scheduled an appt to see me in the office 11/9. Please let me know if I can be of further assistance.

## 2020-01-14 NOTE — TOC Initial Note (Signed)
Transition of Care (TOC) - Initial/Assessment Note    Patient Details  Name: Anna [name] MRN: [id number] Date of Birth: 10/17/1950  Transition of Care (TOC) CM/SW Contact:    Kelli F Willard, RN Phone Number: 01/14/2020, 4:50 PM  Clinical Narrative:                 Recommendations are for HH services. CM met with the patient and her spouse. They had no preference. [facility] has accepted the referral.  Pt has walker at home.  TOC following for further d/c needs.   Expected Discharge Plan: Home w Home Health Services Barriers to Discharge: Continued Medical Work up   Patient Goals and CMS Choice   CMS Medicare.gov Compare Post Acute Care list provided to:: Patient Choice offered to / list presented to : Patient, Spouse  Expected Discharge Plan and Services Expected Discharge Plan: Home w Home Health Services   Discharge Planning Services: CM Consult Post Acute Care Choice: Home Health Living arrangements for the past 2 months: Single Family Home                           HH Arranged: PT HH Agency: [facility]        Prior Living Arrangements/Services Living arrangements for the past 2 months: Single Family Home Lives with:: Spouse Patient language and need for interpreter reviewed:: Yes Do you feel safe going back to the place where you live?: Yes      Need for Family Participation in Patient Care: Yes (Comment) Care giver support system in place?: Yes (comment) Current home services: DME (walker) Criminal Activity/Legal Involvement Pertinent to Current Situation/Hospitalization: No - Comment as needed  Activities of Daily Living Home Assistive Devices/Equipment: None ADL Screening (condition at time of admission) Patient's cognitive ability adequate to safely complete daily activities?: Yes Is the patient deaf or have difficulty hearing?: No Does the patient have difficulty seeing, even when wearing glasses/contacts?: No Does the patient have  difficulty concentrating, remembering, or making decisions?: Yes Patient able to express need for assistance with ADLs?: Yes Does the patient have difficulty dressing or bathing?: Yes Independently performs ADLs?: No Communication: Independent Dressing (OT): Needs assistance Is this a change from baseline?: Pre-admission baseline Grooming: Needs assistance Is this a change from baseline?: Pre-admission baseline Feeding: Independent Bathing: Needs assistance Is this a change from baseline?: Pre-admission baseline Toileting: Needs assistance Is this a change from baseline?: Pre-admission baseline In/Out Bed: Needs assistance Is this a change from baseline?: Pre-admission baseline Walks in Home: Needs assistance Is this a change from baseline?: Pre-admission baseline Does the patient have difficulty walking or climbing stairs?: Yes Weakness of Legs: Right Weakness of Arms/Hands: None  Permission Sought/Granted                  Emotional Assessment Appearance:: Appears stated age Attitude/Demeanor/Rapport: Engaged Affect (typically observed): Accepting Orientation: : Oriented to Self, Oriented to Place, Oriented to  Time, Oriented to Situation   Psych Involvement: No (comment)  Admission diagnosis:  Pyelonephritis [N12] Bilateral leg numbness [R20.0] Bilateral leg weakness [R29.898] Urinary incontinence, unspecified type [R32] Incontinence of feces, unspecified fecal incontinence type [R15.9] Acute midline low back pain, unspecified whether sciatica present [M54.50] E. coli sepsis (HCC) [A41.51] Patient Active Problem List   Diagnosis Date Noted  . E. coli sepsis (HCC) 01/12/2020  . Pyelonephritis 01/11/2020  . Leukocytosis 01/11/2020  . Diarrhea 01/11/2020  . Excessive urination at night 01/11/2020  .   Hyperglycemia 01/11/2020  . S/P lumbar laminectomy 12/10/2019  . Sepsis secondary to UTI (HCC) 01/09/2019  . Essential hypertension 01/09/2019  . Hyponatremia  01/09/2019  . Fall at home, initial encounter 01/09/2019  . AKI (acute kidney injury) (HCC) 01/09/2019  . Chronic back pain 01/09/2019  . Excessive weight gain 01/17/2018  . Nocturia more than twice per night 01/17/2018  . Type 2 diabetes mellitus with hyperglycemia (HCC) 01/17/2018  . Myalgia 01/17/2018  . Psoriasis 01/17/2018  . Spondylosis without myelopathy or radiculopathy, lumbar region 05/25/2016  . Nevus, non-neoplastic 10/11/2011  . Varicose veins of lower extremities with other complications 05/16/2011   PCP:  Paterson, Daniel, MD Pharmacy:   [id number] - [place] ([place]), [place] - [address] [place] ([place]) [place] [address] Phone: [phone] Fax: [phone]     Social Determinants of Health (SDOH) Interventions    Readmission Risk Interventions No flowsheet data found.  

## 2020-01-14 NOTE — Progress Notes (Signed)
Physical Therapy Treatment Patient Details Name: Anna [name] MRN: [id number] DOB: 1950/10/24 Today's Date: 01/14/2020    History of Present Illness 69 y.o. female with medical history significant for fibromyalgia, hypertension, diabetes mellitus, chronic low back pain, restless leg syndrome, and previous back surgery presents to the emergency department for chief concerns of pain down her legs, difficulty walking, excessive urination, and diarrhea. In ED pt found to have  increased respiration, sinus tachycardia, normal low blood pressure, and SPO2 of 90 to 94% on room air. Pt admitted for observation 10/23 for treatment of possible pyelonephritis nephritis and encephalopathy    PT Comments    Pt tolerates treatment well with improved ambulation distance and quality. Pt requires less encouragement this session to participate in mobility and seems eager to mobilize with PT this morning. Pt requires PT cues for back precautions during session and will benefit from continued reinforcement of these along with bed mobility training next session. Pt continues to recommend discharge home with HHPT and assistance from family as needed.   Follow Up Recommendations  Home health PT;Supervision/Assistance - 24 hour     Equipment Recommendations  None recommended by PT (pt reports owning RW)    Recommendations for Other Services       Precautions / Restrictions Precautions Precautions: Fall Restrictions Weight Bearing Restrictions: No    Mobility  Bed Mobility Overal bed mobility: Needs Assistance Bed Mobility: Supine to Sit     Supine to sit: HOB elevated;Supervision     General bed mobility comments: pt impulsively mobilizing to edge of bed, not performing log roll technique, utilizing bed rails with HOB >45 degrees. PT provides cues later in session to perform log roll and sidlying to sit to protect back after recent back surgery  Transfers Overall transfer level: Needs  assistance Equipment used: Rolling walker (2 wheeled) Transfers: Sit to/from Stand Sit to Stand: Min guard            Ambulation/Gait Ambulation/Gait assistance: Min guard Gait Distance (Feet): 60 Feet Assistive device: Rolling walker (2 wheeled) Gait Pattern/deviations: Step-to pattern Gait velocity: reduced Gait velocity interpretation: <1.8 ft/sec, indicate of risk for recurrent falls General Gait Details: pt with short step to gait pattern, reduced gait speed, no significant balance deviations noted   Stairs             Wheelchair Mobility    Modified Rankin (Stroke Patients Only)       Balance Overall balance assessment: Needs assistance Sitting-balance support: No upper extremity supported;Feet supported Sitting balance-Leahy Scale: Poor Sitting balance - Comments: minA due to posterior lean when attempting to don socks Postural control: Posterior lean Standing balance support: Single extremity supported;Bilateral upper extremity supported Standing balance-Leahy Scale: Poor Standing balance comment: reliant on UE support of RW                            Cognition Arousal/Alertness: Awake/alert Behavior During Therapy: WFL for tasks assessed/performed Overall Cognitive Status: Impaired/Different from baseline Area of Impairment: Problem solving                             Problem Solving: Slow processing        Exercises General Exercises - Lower Extremity Ankle Circles/Pumps: AROM;Both;20 reps Gluteal Sets: AROM;Both;10 reps Long Arc Quad: AROM;Both;10 reps    General Comments General comments (skin integrity, edema, etc.): VSS on RA  Pertinent Vitals/Pain Pain Assessment: Faces Faces Pain Scale: Hurts even more Pain Location: low back Pain Descriptors / Indicators: Aching Pain Intervention(s): Monitored during session    Home Living                      Prior Function            PT Goals  (current goals can now be found in the care plan section) Acute Rehab PT Goals Patient Stated Goal: to improve mobility and reduce LE swelling Progress towards PT goals: Progressing toward goals    Frequency    Min 3X/week      PT Plan Current plan remains appropriate    Co-evaluation              AM-PAC PT "6 Clicks" Mobility   Outcome Measure  Help needed turning from your back to your side while in a flat bed without using bedrails?: None Help needed moving from lying on your back to sitting on the side of a flat bed without using bedrails?: A Little Help needed moving to and from a bed to a chair (including a wheelchair)?: A Little Help needed standing up from a chair using your arms (e.g., wheelchair or bedside chair)?: A Little Help needed to walk in hospital room?: A Little Help needed climbing 3-5 steps with a railing? : A Lot 6 Click Score: 18    End of Session   Activity Tolerance: Patient tolerated treatment well Patient left: in chair;with call bell/phone within reach;with chair alarm set;with family/visitor present Nurse Communication: Mobility status PT Visit Diagnosis: Unsteadiness on feet (R26.81);Other abnormalities of gait and mobility (R26.89);Repeated falls (R29.6);Muscle weakness (generalized) (M62.81);History of falling (Z91.81);Difficulty in walking, not elsewhere classified (R26.2);Dizziness and giddiness (R42);Pain Pain - part of body:  (bvack)     Time: 1219-7588 PT Time Calculation (min) (ACUTE ONLY): 27 min  Charges:  $Gait Training: 8-22 mins $Therapeutic Exercise: 8-22 mins                     Zenaida Niece, PT, DPT Acute Rehabilitation Pager: [phone]    Zenaida Niece 01/14/2020, 10:42 AM

## 2020-01-14 NOTE — Plan of Care (Signed)
  Problem: Education: Goal: Knowledge of General Education information will improve Description: Including pain rating scale, medication(s)/side effects and non-pharmacologic comfort measures Outcome: Progressing   Problem: Clinical Measurements: Goal: Will remain free from infection Outcome: Progressing   Problem: Clinical Measurements: Goal: Cardiovascular complication will be avoided Outcome: Progressing   Problem: Pain Managment: Goal: General experience of comfort will improve Outcome: Progressing   Problem: Safety: Goal: Ability to remain free from injury will improve Outcome: Progressing   Problem: Skin Integrity: Goal: Risk for impaired skin integrity will decrease Outcome: Progressing

## 2020-01-15 LAB — BASIC METABOLIC PANEL WITH GFR
Anion gap: 8 (ref 5–15)
BUN: 8 mg/dL (ref 8–23)
CO2: 24 mmol/L (ref 22–32)
Calcium: 8.1 mg/dL — ABNORMAL LOW (ref 8.9–10.3)
Chloride: 104 mmol/L (ref 98–111)
Creatinine, Ser: 1.14 mg/dL — ABNORMAL HIGH (ref 0.44–1.00)
GFR, Estimated: 51 mL/min — ABNORMAL LOW
Glucose, Bld: 125 mg/dL — ABNORMAL HIGH (ref 70–99)
Potassium: 3.7 mmol/L (ref 3.5–5.1)
Sodium: 136 mmol/L (ref 135–145)

## 2020-01-15 LAB — GLUCOSE, CAPILLARY
Glucose-Capillary: 129 mg/dL — ABNORMAL HIGH (ref 70–99)
Glucose-Capillary: 145 mg/dL — ABNORMAL HIGH (ref 70–99)
Glucose-Capillary: 208 mg/dL — ABNORMAL HIGH (ref 70–99)
Glucose-Capillary: 184 mg/dL — ABNORMAL HIGH (ref 70–99)

## 2020-01-15 LAB — CBC
HCT: 34 % — ABNORMAL LOW (ref 36.0–46.0)
Hemoglobin: 11.3 g/dL — ABNORMAL LOW (ref 12.0–15.0)
MCH: 29.5 pg (ref 26.0–34.0)
MCHC: 33.2 g/dL (ref 30.0–36.0)
MCV: 88.8 fL (ref 80.0–100.0)
Platelets: 340 K/uL (ref 150–400)
RBC: 3.83 MIL/uL — ABNORMAL LOW (ref 3.87–5.11)
RDW: 12.7 % (ref 11.5–15.5)
WBC: 12.8 K/uL — ABNORMAL HIGH (ref 4.0–10.5)
nRBC: 0 % (ref 0.0–0.2)

## 2020-01-15 NOTE — Progress Notes (Signed)
PT Cancellation Note  Patient Details Name: Anna [name] MRN: [id number] DOB: 01-08-1951   Cancelled Treatment:    Reason Eval/Treat Not Completed: Patient declined, no reason specified. PT attempted to see pt 3 times for PT today. Upon initial attempt pt eating lunch, requested PT return later. On 2nd attempt pt was sleeping and spouse requested PT defer until later. 3rd attempt pt awake, refusing, reporting she is not up to it physically or mentally today and just wants to go home. PT will attempt to follow up per POC.   Zenaida Niece 01/15/2020, 4:38 PM

## 2020-01-15 NOTE — Progress Notes (Signed)
PROGRESS NOTE    Anna [name]  VHQ:[id number] DOB: May 05, 1950 DOA: 01/10/2020 PCP: Leanna Battles, MD   Chief Complaint  Patient presents with  . Back Pain   Brief Narrative: 69 yof with history of fibromyalgia, hypertension, diabetes mellitus, chronic low back pain, restless leg syndrome, recent lumbar laminectomy/decompression micro discectomy on 12/10/2027 by Dr. Sherley Bounds presented with 2 to 3 days history of pain down her legs, difficulty walking, excessive urination and diarrhea.  Patient reported there was fever but unclear what the T-max was, no fall no headache vision changes trouble swallowing cough.  Patient had urgency frequency of urination some shortness of breath as well as numbness down her legs. Patient was seen in the ED, had difficulty speaking was alert and oriented to self, age current location current year. 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  There is enhancing scar tissue in this region.  Similar multilevel degenerative changes including severe left foraminal stenosis at L3-L4 and L4-L5 and moderate right foraminal and subarticular recess stenosis at L2 to L3.  Portable chest x-ray was also ordered and showed no radiological evidence of active pulmonary disease".  She was admitted started on empiric antibiotics, stool C. difficile was ordered. Patient found to have E coli UTI and sepsis/bacteremia  Subjective:  Some abd discomfort. Legs feel tight and painful bilaterally On RA. BM x1 yesterday,was not that loose she says No new complaints.  Does not feel comfortable going home today.  Assessment & Plan:  E. coli Sepsis POA due to E. coli UTI/pyelonephritis: Antibiotic deescalated to cefazolin and discharged with change to Keflex for total of 7 days-discussed with Dr. Linus Salmons from ID. Has been afebrile and leukocytosis  improving.  E. coli pyelonephritis:plan per #1.CT Renal study-right hydroureteronephrosis with right-sided perinephric soft tissue stranding.Nonobstructing calculus 3 mm on the left kidney.  Difficulty with ambulation/imbalance/excessive urination/change in behavior acute metabolic encephalopathy at home: Patient not confused in the ED. mental status significantly improved as per daughter, seen by neurosurgery and MRI brain was canceled as he feels all of a sudden less likely NPH. Continue UTI treatment continue PT OT.    Recent lumbar laminectomy/decompression micro discectomy on 12/10/2027 by Dr. Sherley Bounds: MRI lumbar spine on admission showing improvement, neurosurgery was consulted by EDP. Sed rate and CRP elevated likely due to UTI and sepsis.seen by neurosurgery and no further recommendation. Discussed with Dr. Ronnald Ramp 10/26.  Multiple episodes of diarrhea at home: C. difficile sent positive for antigen but negative for toxin and results indeterminate but patient diarrhea has resolved leukocyte improved after getting treatment for E Coli Pyelonephritis-discussed w/ Dr Linus Salmons.  AKI likely from GI/diarrhea, resolved.  Stop IV fluids  Metabolic acidosis in the setting of AKI/diarrhea:bicarb improved to 22.    HypoKalemia: Resolved  Hypertension:blood pressure well controlled on amlodipine and Irbesartan.Monitor.  Hyponatremia: Resolved.  T2DM, blood sugar controlled, A1c poorly controlled 9.2, blood sugar 90s to 140s. Cont ssi. Recent Labs  Lab 01/13/20 1631 01/13/20 2139 01/14/20 1300 01/14/20 1657 01/15/20 0648  GLUCAP 97 97 125* 146* 129*   Fibromyalgia patient reports she is on Neurontin but did not respond well. Diffuse pain bilateral chest/back/ abdomen:  chest x-ray NAD, trop slightly up 25-28,EKG was nonischemic, given frequent complaint and pleuritic nature of the pain and recent surgery postop status underwent CT angio that was negative for PE.  Continue pain control  physical therapy increase  activity  Diet Order            Diet regular Room service appropriate? Yes; Fluid consistency: Thin  Diet effective now                Body mass index is 27.28 kg/m. DVT prophylaxis: enoxaparin (LOVENOX) injection 40 mg Start: 01/12/20 0945 Place and maintain sequential compression device Start: 01/12/20 0856 Place TED hose Start: 01/11/20 0148 Code Status:   Code Status: Full Code  Family Communication: plan of care discussed with patient.  Previously discussed with patient's daughter and husband.   Status is: admitted as Observation Remains hospitalized for ongoing management of E. coli sepsis bacteremia UTI and was changed to inpatient status Dispo:The patient is from: Home            Anticipated d/c is to: Home w/ HH .            Anticipated d/c date is: 1 day            Patient currently is not medically stable to d/c.  Consultants:see note  Procedures:see note  Culture/Microbiology    Component Value Date/Time   SDES URINE, RANDOM 01/10/2020 2355   SPECREQUEST  01/10/2020 2355    NONE Performed at [facility], [place] [address], [place], [place] [address]    CULT >=100,000 COLONIES/mL ESCHERICHIA COLI (A) 01/10/2020 2355   REPTSTATUS 01/13/2020 FINAL 01/10/2020 2355    Other culture-see note  Medications: Scheduled Meds: . amLODipine  5 mg Oral Daily   And  . irbesartan  150 mg Oral Daily  . enoxaparin (LOVENOX) injection  40 mg Subcutaneous Q24H  . insulin aspart  0-9 Units Subcutaneous TID WC  . melatonin  6 mg Oral QHS   Continuous Infusions: .  ceFAZolin (ANCEF) IV 2 g (01/15/20 0538)   Antimicrobials: Anti-infectives (From admission, onward)   Start     Dose/Rate Route Frequency Ordered Stop   01/13/20 2200  ceFAZolin (ANCEF) IVPB 2g/100 mL premix        2 g 200 mL/hr over 30 Minutes Intravenous Every 8 hours 01/13/20 0930     01/11/20 2200  cefTRIAXone (ROCEPHIN) 1 g in sodium chloride 0.9 % 100 mL IVPB  Status:   Discontinued        1 g 200 mL/hr over 30 Minutes Intravenous Every 24 hours 01/11/20 0200 01/11/20 0827   01/11/20 2200  cefTRIAXone (ROCEPHIN) 2 g in sodium chloride 0.9 % 100 mL IVPB  Status:  Discontinued        2 g 200 mL/hr over 30 Minutes Intravenous Every 24 hours 01/11/20 0827 01/13/20 0930   01/11/20 0045  cefTRIAXone (ROCEPHIN) 2 g in sodium chloride 0.9 % 100 mL IVPB        2 g 200 mL/hr over 30 Minutes Intravenous  Once 01/11/20 0042 01/11/20 0123     Objective: Vitals: Today's Vitals   01/14/20 2108 01/14/20 2138 01/14/20 2300 01/15/20 0306  BP:   (!) 154/66 (!) 146/63  Pulse:   96 75  Resp:   18 18  Temp:   98.6 F (37 C) 98.6 F (37 C)  TempSrc:   Oral Oral  SpO2:   96% 96%  Weight:      Height:      PainSc: 8  0-No pain      Intake/Output Summary (Last 24 hours) at 01/15/2020 0838 Last data filed at 01/15/2020 0305 Gross per 24 hour  Intake 336.72 ml  Output  2350 ml  Net -2013.28 ml   Filed Weights   01/11/20 0409  Weight: 72.1 kg   Weight change:   Intake/Output from previous day: 10/27 0701 - 10/28 0700 In: 336.7 [I.V.:183.4; IV Piggyback:153.3] Out: 2350 [Urine:2350] Intake/Output this shift: No intake/output data recorded.  Examination: General exam: AAOx3 , NAD, weak appearing. HEENT:Oral mucosa moist, Ear/Nose WNL grossly, dentition normal. Respiratory system: bilaterally clear,no wheezing or crackles,no use of accessory muscle Cardiovascular system: S1 & S2 +, No JVD,. Gastrointestinal system: Abdomen soft, NT,ND, BS+ Nervous System:Alert, awake, moving extremities and grossly nonfocal Extremities: No edema, distal peripheral pulses palpable.  Skin: No rashes,no icterus. MSK: Normal muscle bulk,tone, power  Data Reviewed: I have personally reviewed following labs and imaging studies CBC: Recent Labs  Lab 01/10/20 2016 01/10/20 2016 01/11/20 0330 01/12/20 0446 01/13/20 0507 01/14/20 0420 01/15/20 0404  WBC 18.5*   < > 16.6*  17.5* 13.7* 13.3* 12.8*  NEUTROABS 16.2*  --   --   --   --   --   --   HGB 14.1   < > 12.1 13.1 10.5* 10.5* 11.3*  HCT 41.8   < > 36.6 39.5 31.1* 31.6* 34.0*  MCV 88.2   < > 90.1 92.5 88.1 88.3 88.8  PLT 220   < > 179 164 199 260 340   < > = values in this interval not displayed.   Basic Metabolic Panel: Recent Labs  Lab 01/11/20 0330 01/12/20 0446 01/13/20 0507 01/14/20 0420 01/15/20 0404  NA 132* 134* 136 136 136  K 3.4* 4.4 3.2* 3.3* 3.7  CL 101 106 108 106 104  CO2 19* 17* 21* 22 24  GLUCOSE 206* 99 82 82 125*  BUN 24* 18 13 11 8   CREATININE 1.45* 1.29* 1.11* 1.11* 1.14*  CALCIUM 8.0* 8.2* 7.9* 7.9* 8.1*   GFR: Estimated Creatinine Clearance: 44.1 mL/min (A) (by C-G formula based on SCr of 1.14 mg/dL (H)). Liver Function Tests: Recent Labs  Lab 01/10/20 2016 01/11/20 0330  AST 13* 12*  ALT 16 14  ALKPHOS 98 87  BILITOT 1.2 0.6  PROT 6.5 5.4*  ALBUMIN 2.5* 1.9*   Recent Labs  Lab 01/10/20 2016  LIPASE 16   No results for input(s): AMMONIA in the last 168 hours. Coagulation Profile: No results for input(s): INR, PROTIME in the last 168 hours. Cardiac Enzymes: No results for input(s): CKTOTAL, CKMB, CKMBINDEX, TROPONINI in the last 168 hours. BNP (last 3 results) No results for input(s): PROBNP in the last 8760 hours. HbA1C: No results for input(s): HGBA1C in the last 72 hours. CBG: Recent Labs  Lab 01/13/20 1631 01/13/20 2139 01/14/20 1300 01/14/20 1657 01/15/20 0648  GLUCAP 97 97 125* 146* 129*   Lipid Profile: No results for input(s): CHOL, HDL, LDLCALC, TRIG, CHOLHDL, LDLDIRECT in the last 72 hours. Thyroid Function Tests: No results for input(s): TSH, T4TOTAL, FREET4, T3FREE, THYROIDAB in the last 72 hours. Anemia Panel: No results for input(s): VITAMINB12, FOLATE, FERRITIN, TIBC, IRON, RETICCTPCT in the last 72 hours. Sepsis Labs: Recent Labs  Lab 01/10/20 2016  LATICACIDVEN 1.3    Recent Results (from the past 240 hour(s))   Respiratory Panel by RT PCR (Flu A&B, Covid) - Nasopharyngeal Swab     Status: None   Collection Time: 01/10/20  7:58 PM   Specimen: Nasopharyngeal Swab  Result Value Ref Range Status   SARS Coronavirus 2 by RT PCR NEGATIVE NEGATIVE Final    Comment: (NOTE) SARS-CoV-2 target nucleic acids are NOT  DETECTED.  The SARS-CoV-2 RNA is generally detectable in upper respiratoy specimens during the acute phase of infection. The lowest concentration of SARS-CoV-2 viral copies this assay can detect is 131 copies/mL. A negative result does not preclude SARS-Cov-2 infection and should not be used as the sole basis for treatment or other patient management decisions. A negative result may occur with  improper specimen collection/handling, submission of specimen other than nasopharyngeal swab, presence of viral mutation(s) within the areas targeted by this assay, and inadequate number of viral copies (<131 copies/mL). A negative result must be combined with clinical observations, patient history, and epidemiological information. The expected result is Negative.  Fact Sheet for Patients:  PinkCheek.be  Fact Sheet for Healthcare Providers:  GravelBags.it  This test is no t yet approved or cleared by the [place] FDA and  has been authorized for detection and/or diagnosis of SARS-CoV-2 by FDA under an Emergency Use Authorization (EUA). This EUA will remain  in effect (meaning this test can be used) for the duration of the COVID-19 declaration under Section 564(b)(1) of the Act, 21 U.S.C. section 360bbb-3(b)(1), unless the authorization is terminated or revoked sooner.     Influenza A by PCR NEGATIVE NEGATIVE Final   Influenza B by PCR NEGATIVE NEGATIVE Final    Comment: (NOTE) The Xpert Xpress SARS-CoV-2/FLU/RSV assay is intended as an aid in  the diagnosis of influenza from Nasopharyngeal swab specimens and  should not be used as  a sole basis for treatment. Nasal washings and  aspirates are unacceptable for Xpert Xpress SARS-CoV-2/FLU/RSV  testing.  Fact Sheet for Patients: PinkCheek.be  Fact Sheet for Healthcare Providers: GravelBags.it  This test is not yet approved or cleared by the [place] FDA and  has been authorized for detection and/or diagnosis of SARS-CoV-2 by  FDA under an Emergency Use Authorization (EUA). This EUA will remain  in effect (meaning this test can be used) for the duration of the  Covid-19 declaration under Section 564(b)(1) of the Act, 21  U.S.C. section 360bbb-3(b)(1), unless the authorization is  terminated or revoked. Performed at [facility], [place] [address], [place], [place] [address]   Blood culture (routine x 2)     Status: Abnormal   Collection Time: 01/10/20  8:16 PM   Specimen: BLOOD  Result Value Ref Range Status   Specimen Description BLOOD LEFT ANTECUBITAL  Final   Special Requests   Final    BOTTLES DRAWN AEROBIC AND ANAEROBIC Blood Culture adequate volume   Culture  Setup Time   Final    GRAM NEGATIVE RODS IN BOTH AEROBIC AND ANAEROBIC BOTTLES CRITICAL RESULT CALLED TO, READ BACK BY AND VERIFIED WITH: [place] [id number] MLM Performed at [facility], [place] [address] [place], [place] [address]    Culture ESCHERICHIA COLI (A)  Final   Report Status 01/13/2020 FINAL  Final   Organism ID, Bacteria ESCHERICHIA COLI  Final      Susceptibility   Escherichia coli - MIC*    AMPICILLIN >=32 RESISTANT Resistant     CEFAZOLIN <=4 SENSITIVE Sensitive     CEFEPIME <=0.12 SENSITIVE Sensitive     CEFTAZIDIME <=1 SENSITIVE Sensitive     CEFTRIAXONE <=0.25 SENSITIVE Sensitive     CIPROFLOXACIN <=0.25 SENSITIVE Sensitive     GENTAMICIN <=1 SENSITIVE Sensitive     IMIPENEM <=0.25 SENSITIVE Sensitive     TRIMETH/SULFA <=20 SENSITIVE Sensitive     AMPICILLIN/SULBACTAM 16 INTERMEDIATE  Intermediate  PIP/TAZO <=4 SENSITIVE Sensitive     * ESCHERICHIA COLI  Blood culture (routine x 2)     Status: Abnormal   Collection Time: 01/10/20  8:16 PM   Specimen: BLOOD LEFT HAND  Result Value Ref Range Status   Specimen Description BLOOD LEFT HAND  Final   Special Requests   Final    BOTTLES DRAWN AEROBIC AND ANAEROBIC Blood Culture adequate volume   Culture  Setup Time   Final    GRAM NEGATIVE RODS IN BOTH AEROBIC AND ANAEROBIC BOTTLES CRITICAL VALUE NOTED.  VALUE IS CONSISTENT WITH PREVIOUSLY REPORTED AND CALLED VALUE.    Culture (A)  Final    ESCHERICHIA COLI SUSCEPTIBILITIES PERFORMED ON PREVIOUS CULTURE WITHIN THE LAST 5 DAYS. Performed at [facility], [place] [address], [place], [place] [address]    Report Status 01/13/2020 FINAL  Final  Blood Culture ID Panel (Reflexed)     Status: Abnormal   Collection Time: 01/10/20  8:16 PM  Result Value Ref Range Status   Enterococcus faecalis NOT DETECTED NOT DETECTED Final   Enterococcus Faecium NOT DETECTED NOT DETECTED Final   Listeria monocytogenes NOT DETECTED NOT DETECTED Final   Staphylococcus species NOT DETECTED NOT DETECTED Final   Staphylococcus aureus (BCID) NOT DETECTED NOT DETECTED Final   Staphylococcus epidermidis NOT DETECTED NOT DETECTED Final   Staphylococcus lugdunensis NOT DETECTED NOT DETECTED Final   Streptococcus species NOT DETECTED NOT DETECTED Final   Streptococcus agalactiae NOT DETECTED NOT DETECTED Final   Streptococcus pneumoniae NOT DETECTED NOT DETECTED Final   Streptococcus pyogenes NOT DETECTED NOT DETECTED Final   A.calcoaceticus-baumannii NOT DETECTED NOT DETECTED Final   Bacteroides fragilis NOT DETECTED NOT DETECTED Final   Enterobacterales DETECTED (A) NOT DETECTED Final    Comment: Enterobacterales represent a large order of gram negative bacteria, not a single organism. CRITICAL RESULT CALLED TO, READ BACK BY AND VERIFIED WITH: PHARMD J MILLEN [phone] MLM    Enterobacter  cloacae complex NOT DETECTED NOT DETECTED Final   Escherichia coli DETECTED (A) NOT DETECTED Final    Comment: CRITICAL RESULT CALLED TO, READ BACK BY AND VERIFIED WITH: PHARMD J MILLEN [phone] MLM    Klebsiella aerogenes NOT DETECTED NOT DETECTED Final   Klebsiella oxytoca NOT DETECTED NOT DETECTED Final   Klebsiella pneumoniae NOT DETECTED NOT DETECTED Final   Proteus species NOT DETECTED NOT DETECTED Final   Salmonella species NOT DETECTED NOT DETECTED Final   Serratia marcescens NOT DETECTED NOT DETECTED Final   Haemophilus influenzae NOT DETECTED NOT DETECTED Final   Neisseria meningitidis NOT DETECTED NOT DETECTED Final   Pseudomonas aeruginosa NOT DETECTED NOT DETECTED Final   Stenotrophomonas maltophilia NOT DETECTED NOT DETECTED Final   Candida albicans NOT DETECTED NOT DETECTED Final   Candida auris NOT DETECTED NOT DETECTED Final   Candida glabrata NOT DETECTED NOT DETECTED Final   Candida krusei NOT DETECTED NOT DETECTED Final   Candida parapsilosis NOT DETECTED NOT DETECTED Final   Candida tropicalis NOT DETECTED NOT DETECTED Final   Cryptococcus neoformans/gattii NOT DETECTED NOT DETECTED Final   CTX-M ESBL NOT DETECTED NOT DETECTED Final   Carbapenem resistance IMP NOT DETECTED NOT DETECTED Final   Carbapenem resistance KPC NOT DETECTED NOT DETECTED Final   Carbapenem resistance NDM NOT DETECTED NOT DETECTED Final   Carbapenem resist OXA 48 LIKE NOT DETECTED NOT DETECTED Final   Carbapenem resistance VIM NOT DETECTED NOT DETECTED Final    Comment: Performed at [facility], [address] [place], [place] [address]  Urine culture     Status: Abnormal   Collection Time: 01/10/20 11:55 PM   Specimen: Urine, Random  Result Value Ref Range Status   Specimen Description URINE, RANDOM  Final   Special Requests   Final    NONE Performed at [facility], [place] [address], [place], [place] [address]    Culture >=100,000 COLONIES/mL ESCHERICHIA COLI (A)   Final   Report Status 01/13/2020 FINAL  Final   Organism ID, Bacteria ESCHERICHIA COLI (A)  Final      Susceptibility   Escherichia coli - MIC*    AMPICILLIN >=32 RESISTANT Resistant     CEFAZOLIN <=4 SENSITIVE Sensitive     CEFTRIAXONE <=0.25 SENSITIVE Sensitive     CIPROFLOXACIN <=0.25 SENSITIVE Sensitive     GENTAMICIN <=1 SENSITIVE Sensitive     IMIPENEM <=0.25 SENSITIVE Sensitive     NITROFURANTOIN <=16 SENSITIVE Sensitive     TRIMETH/SULFA <=20 SENSITIVE Sensitive     AMPICILLIN/SULBACTAM 16 INTERMEDIATE Intermediate     PIP/TAZO <=4 SENSITIVE Sensitive     * >=100,000 COLONIES/mL ESCHERICHIA COLI  C Difficile Quick Screen (NO PCR Reflex)     Status: Abnormal   Collection Time: 01/13/20 11:41 AM   Specimen: STOOL  Result Value Ref Range Status   C Diff antigen POSITIVE (A) NEGATIVE Final   C Diff toxin NEGATIVE NEGATIVE Final   C Diff interpretation   Final    Results are indeterminate. Please contact your campus specific ID physician or [facility].    Comment: Performed at [facility], [place] [address], [place], [place] [address]     Radiology Studies: CT ANGIO CHEST PE W OR WO CONTRAST  Result Date: 01/14/2020 CLINICAL DATA:  Pleuritic chest pain EXAM: CT ANGIOGRAPHY CHEST WITH CONTRAST TECHNIQUE: Multidetector CT imaging of the chest was performed using the standard protocol during bolus administration of intravenous contrast. Multiplanar CT image reconstructions and MIPs were obtained to evaluate the vascular anatomy. CONTRAST:  1104mL OMNIPAQUE IOHEXOL 350 MG/ML SOLN COMPARISON:  None. FINDINGS: Cardiovascular: There is a optimal opacification of the pulmonary arteries. There is no central,segmental, or subsegmental filling defects within the pulmonary arteries. The heart is normal in size. A small pericardial effusion is present. No evidence right heart strain. There is normal three-vessel brachiocephalic anatomy without proximal stenosis. Scattered minimal aortic  atherosclerosis is seen. There is also a small amount of calcifications seen within the aortic valve. Mediastinum/Nodes: No hilar, mediastinal, or axillary adenopathy. Thyroid gland, trachea, and esophagus demonstrate no significant findings. Lungs/Pleura: Mild interlobular septal thickening seen within both lung apices. There is small bilateral pleural effusions, right greater than left. Adjacent patchy airspace consolidation seen at both lung bases. Upper Abdomen: No acute abnormalities present in the visualized portions of the upper abdomen. Musculoskeletal: No chest wall abnormality. No acute or significant osseous findings. Review of the MIP images confirms the above findings. IMPRESSION: No central, segmental, or subsegmental pulmonary embolism. Small bilateral pleural effusions, right greater than left with findings that could be suggestive of mild interstitial edema. Small pericardial effusion Electronically Signed   By: Prudencio Pair M.D.   On: 01/14/2020 23:09   DG Chest Port 1 View  Result Date: 01/13/2020 CLINICAL DATA:  Chest pain EXAM: PORTABLE CHEST 1 VIEW COMPARISON:  None. FINDINGS: The heart size and mediastinal contours are within normal limits. Low lung volumes. Minimal density at the right lung base probably reflects atelectasis. There is mild elevation of  the right hemidiaphragm. No pleural effusion. No pneumothorax. IMPRESSION: Minimal density at the right lung base probably reflects atelectasis. Electronically Signed   By: Macy Mis M.D.   On: 01/13/2020 09:24   CT RENAL STONE STUDY  Result Date: 01/14/2020 CLINICAL DATA:  69 year old female with history of sepsis. EXAM: CT ABDOMEN AND PELVIS WITHOUT CONTRAST TECHNIQUE: Multidetector CT imaging of the abdomen and pelvis was performed following the standard protocol without IV contrast. COMPARISON:  No priors. FINDINGS: Lower chest: Small bilateral pleural effusions lying dependently with areas of passive subsegmental atelectasis  in the lower lobes of the lungs bilaterally. Aortic atherosclerosis. Hepatobiliary: Mild diffuse low attenuation throughout the hepatic parenchyma, indicative of hepatic steatosis. No definite suspicious cystic or solid hepatic lesions are confidently identified on today's noncontrast CT examination. Unenhanced appearance of the gallbladder is normal. Pancreas: No definite pancreatic mass or peripancreatic fluid collections or inflammatory changes are noted on today's noncontrast CT examination. Spleen: Unremarkable. Adrenals/Urinary Tract: 3 mm nonobstructive calculus in the interpolar collecting system of the left kidney. No additional calculi are noted within the right renal collecting system, along the course of either ureter, or within the lumen of the urinary bladder. Mild right hydroureteronephrosis with right-sided perinephric and periureteric soft tissue stranding. Unenhanced appearance of the left kidney and bilateral adrenal glands is otherwise normal. Urinary bladder is normal in appearance. Stomach/Bowel: Unenhanced appearance of the stomach is normal. No pathologic dilatation of small bowel or colon. Normal appendix. Vascular/Lymphatic: Aortic atherosclerosis. No lymphadenopathy noted in the abdomen or pelvis. Reproductive: Status post hysterectomy. Ovaries are not confidently identified may be surgically absent or atrophic. Other: Trace volume of ascites in the low anatomic pelvis. No pneumoperitoneum. Musculoskeletal: Chronic appearing compression fracture of L2 with 30% loss of anterior vertebral body height. There are no aggressive appearing lytic or blastic lesions noted in the visualized portions of the skeleton. IMPRESSION: 1. Mild right hydroureteronephrosis with right-sided perinephric soft tissue stranding. No calcified obstructive nephrolithiasis in the right ureter to account for these findings. Clinical correlation for signs and symptoms of right-sided pyelonephritis is recommended. 2.  Nonobstructive calculus measuring 3 mm in the interpolar collecting system of the left kidney. 3. Small bilateral pleural effusions with dependent areas of subsegmental atelectasis in the lower lobes of the lungs bilaterally. 4. Mild hepatic steatosis. 5. Aortic atherosclerosis. Electronically Signed   By: Vinnie Langton M.D.   On: 01/14/2020 15:41     LOS: 3 days   Antonieta Pert, MD [facility]  01/15/2020, 8:38 AM

## 2020-01-16 DIAGNOSIS — A4151 Sepsis due to Escherichia coli [E. coli]: Principal | ICD-10-CM

## 2020-01-16 DIAGNOSIS — N179 Acute kidney failure, unspecified: Secondary | ICD-10-CM

## 2020-01-16 DIAGNOSIS — R6520 Severe sepsis without septic shock: Secondary | ICD-10-CM

## 2020-01-16 LAB — CBC
HCT: 34.7 % — ABNORMAL LOW (ref 36.0–46.0)
Hemoglobin: 11.6 g/dL — ABNORMAL LOW (ref 12.0–15.0)
MCH: 29.5 pg (ref 26.0–34.0)
MCHC: 33.4 g/dL (ref 30.0–36.0)
MCV: 88.3 fL (ref 80.0–100.0)
Platelets: 404 10*3/uL — ABNORMAL HIGH (ref 150–400)
RBC: 3.93 MIL/uL (ref 3.87–5.11)
RDW: 12.4 % (ref 11.5–15.5)
WBC: 11.7 10*3/uL — ABNORMAL HIGH (ref 4.0–10.5)
nRBC: 0 % (ref 0.0–0.2)

## 2020-01-16 LAB — BASIC METABOLIC PANEL
Anion gap: 9 (ref 5–15)
BUN: 7 mg/dL — ABNORMAL LOW (ref 8–23)
CO2: 26 mmol/L (ref 22–32)
Calcium: 8 mg/dL — ABNORMAL LOW (ref 8.9–10.3)
Chloride: 102 mmol/L (ref 98–111)
Creatinine, Ser: 1.07 mg/dL — ABNORMAL HIGH (ref 0.44–1.00)
GFR, Estimated: 56 mL/min — ABNORMAL LOW (ref >60)
Glucose, Bld: 166 mg/dL — ABNORMAL HIGH (ref 70–99)
Potassium: 3.1 mmol/L — ABNORMAL LOW (ref 3.5–5.1)
Sodium: 137 mmol/L (ref 135–145)

## 2020-01-16 LAB — GLUCOSE, CAPILLARY
Glucose-Capillary: 160 mg/dL — ABNORMAL HIGH (ref 70–99)
Glucose-Capillary: 226 mg/dL — ABNORMAL HIGH (ref 70–99)

## 2020-01-16 LAB — MAGNESIUM: Magnesium: 1.6 mg/dL — ABNORMAL LOW (ref 1.7–2.4)

## 2020-01-16 MED ORDER — POTASSIUM CHLORIDE ER 10 MEQ PO TBCR: 10.0000 meq | EXTENDED_RELEASE_TABLET | Freq: Every day | ORAL | 0 refills | Status: DC

## 2020-01-16 MED ORDER — MAGNESIUM SULFATE 2 GM/50ML IV SOLN
2.0000 g | Freq: Once | INTRAVENOUS | Status: AC
  Administered 2020-01-16: 2 g via INTRAVENOUS
  Filled 2020-01-16: qty 50

## 2020-01-16 MED ORDER — CEPHALEXIN 500 MG PO CAPS: 500.0000 mg | ORAL_CAPSULE | Freq: Two times a day (BID) | ORAL | 0 refills | Status: AC

## 2020-01-16 MED ORDER — POTASSIUM CHLORIDE 20 MEQ PO PACK
40.0000 meq | PACK | Freq: Two times a day (BID) | ORAL | Status: DC
  Administered 2020-01-16: 40 meq via ORAL
  Filled 2020-01-16: qty 2

## 2020-01-16 MED ORDER — ACETAMINOPHEN 325 MG PO TABS: 650.0000 mg | ORAL_TABLET | Freq: Four times a day (QID) | ORAL | Status: DC | PRN

## 2020-01-16 NOTE — TOC Transition Note (Signed)
Transition of Care [facility]) - CM/SW Discharge Note   Patient Details  Name: Anna [name] MRN: [id number] Date of Birth: 1950-04-27  Transition of Care [facility] CM/SW Contact:  Pollie Friar, RN Phone Number: 01/16/2020, 11:05 AM   Clinical Narrative:    Pt discharging home with [facility] services through [place]. Cory with [facility] is aware of d/c home today.  Pt has transportation home today.   Final next level of care: Home w Home Health Services Barriers to Discharge: No Barriers Identified   Patient Goals and CMS Choice   CMS Medicare.gov Compare Post Acute Care list provided to:: Patient Choice offered to / list presented to : Patient, Spouse  Discharge Placement                       Discharge Plan and Services   Discharge Planning Services: CM Consult Post Acute Care Choice: Home Health                    HH Arranged: PT [place]: [place]        Social Determinants of Health (SDOH) Interventions     Readmission Risk Interventions No flowsheet data found.

## 2020-01-16 NOTE — Discharge Summary (Signed)
Physician Discharge Summary  Anna [name] STM:[id number] DOB: [birth year] DOA: 01/10/2020  PCP: Leanna Battles, MD  Admit date: 01/10/2020 Discharge date: 01/16/2020  Admitted From: home Disposition:  Home  Recommendations for Outpatient Follow-up:  1. Follow up with PCP in 1 week  2. please obtain BMP/CBC/magnesium in one week 3. Take Keflex as prescribed.  Home Health: None Equipment/Devices: None Discharge Condition: Stable CODE STATUS: Full code Diet recommendation: Heart healthy diet  Brief/Interim Summary:  69 yof with history of fibromyalgia, hypertension, diabetes mellitus, chronic low back pain, restless leg syndrome, recent lumbar laminectomy/decompression micro discectomy on 12/10/2027 by Dr. Sherley Bounds presented with 2 to 3 days history of pain down her legs, difficulty walking, excessive urination and diarrhea.  Patient reported there was fever but unclear what the T-max was, no fall no headache vision changes trouble swallowing cough.  Patient had urgency frequency of urination some shortness of breath as well as numbness down her legs. Patient was seen in the ED, had difficulty speaking was alert and oriented to self, age current location current year. ED discussed with neurosurgery and updated MRI lumbar spine "showed significant improvement of right subarticular recess stenosis at L3 and L4 status post microdiscectomy. There is enhancing scar tissue in this region. Similar multilevel degenerative changes including severe left foraminal stenosis at L3-L4 and L4-L5 and moderate right foraminal and subarticular recess stenosis at L2 toL3.Portable chest x-ray was also ordered and showed no radiological evidence of active pulmonary disease".  She was admitted started on empiric antibiotics, stool C. difficile was ordered. Patient found to have E coli UTI and sepsis/bacteremia   E. coli Sepsis POA due to E. coli UTI/pyelonephritis:  ID was consulted-antibiotic  deescalated to cefazolin and discharged with change to Keflex for total of 7 days -Patient remained afebrile.  Leukocytosis improved.  E. coli pyelonephritis: CT Renal study-right hydroureteronephrosis with right-sided perinephric soft tissue stranding.Nonobstructing calculus 3 mm on the left kidney -Discharged on Keflex 500 twice daily for 7 days.  acute metabolic encephalopathy at home: Mental status significantly improved. seen by neurosurgery and MRI brain was canceled-recommended UTI treatment  Recent lumbar laminectomy/decompression micro discectomy on 12/10/2027 by Dr. Sherley Bounds: MRI lumbar spine on admission showing improvement, neurosurgery was consulted by EDP. Sed rate and CRP elevated likely due to UTI and sepsis.seen by neurosurgery and no further recommendation.   Multiple episodes of diarrhea at home: C. difficile sent positive for antigen but negative for toxin and results indeterminate but patient diarrhea has resolved leukocyte improved after getting treatment for E Coli Pyelonephritis.  ID was consulted.  AKI likely from GI/diarrhea, resolved.   -Kidney function improved.  Creatinine: 1.04, GFR: 56. -Repeat BMP in 1 week with PCP follow-up.  Metabolic acidosis in the setting of AKI/diarrhea: Resolved.  Bicarb improved to 26.  HypoKalemia:  Potassium 3.1 this morning. -Replenished.  Prescribed KCl 10 M EQ once daily for 3 more days. -Repeat BMP on follow-up visit  Hypomagnesemia: Replenished -Repeat magnesium level on follow-up visit.  Hypertension:blood pressure well controlled on amlodipine and Irbesartan.continue same home meds.  T2DM, blood sugar controlled, A1c poorly controlled 9.2, blood sugar 90s to 140s. Cont Metformin.  Follow-up with PCP in 1 week to discuss about uncontrolled diabetes mellitus.  Hyponatremia: Resolved  Discharge Diagnoses:  E. coli Sepsis POA due to E. coli UTI/pyelonephritis:  E. coli pyelonephritis acute metabolic encephalopathy at  home Recent lumbar laminectomy/decompression micro discectomy  Diarrhea AKI Metabolic acidosis in the setting of AKI/diarrhea Hypokalemia Hypomagnesemia Hypertension Hyponatremia  Type 2 diabetes mellitus Fibromyalgia      Discharge Instructions  Discharge Instructions    Diet - low sodium heart healthy   Complete by: As directed    Discharge instructions   Complete by: As directed    Follow up with PCP in 1 week Take Keflex as scheduled Repeat BMP  & magnesium to check for potassium, Mg & renal function on f/u visit   Increase activity slowly   Complete by: As directed    No wound care   Complete by: As directed      Allergies as of 01/16/2020      Reactions   Benadryl [diphenhydramine Hcl] Other (See Comments)   chills      Medication List    STOP taking these medications   oxyCODONE-acetaminophen 5-325 MG tablet Commonly known as: PERCOCET/ROXICET     TAKE these medications   acetaminophen 325 MG tablet Commonly known as: TYLENOL Take 2 tablets (650 mg total) by mouth every 6 (six) hours as needed for mild pain, fever or headache.   amLODipine-olmesartan 5-20 MG tablet Commonly known as: AZOR Take 1 tablet by mouth daily.   aspirin EC 81 MG tablet Take 81 mg by mouth daily.   atorvastatin 40 MG tablet Commonly known as: LIPITOR Take 40 mg by mouth daily.   celecoxib 200 MG capsule Commonly known as: CELEBREX Take 1 capsule (200 mg total) by mouth 2 (two) times daily as needed for mild pain.   cephALEXin 500 MG capsule Commonly known as: KEFLEX Take 1 capsule (500 mg total) by mouth 2 (two) times daily for 7 days.   gabapentin 100 MG capsule Commonly known as: NEURONTIN Take 1 capsule (100 mg total) by mouth 3 (three) times daily. When necessary for neuropathy pain   GLUCOSAMINE PO Take 1,000 mg by mouth daily.   metFORMIN 500 MG 24 hr tablet Commonly known as: GLUCOPHAGE-XR Take 500 mg by mouth daily.   potassium chloride 10 MEQ  tablet Commonly known as: KLOR-CON Take 1 tablet (10 mEq total) by mouth daily for 3 days.       Follow-up Information    [facility] Follow up.   Specialty: [place] Why: The home health agency will contact you for the first home visit Contact information: [address] [place] [address] [phone]              Allergies  Allergen Reactions  . Benadryl [Diphenhydramine Hcl] Other (See Comments)    chills    Consultations:  None   Procedures/Studies: CT ANGIO CHEST PE W OR WO CONTRAST  Result Date: 01/14/2020 CLINICAL DATA:  Pleuritic chest pain EXAM: CT ANGIOGRAPHY CHEST WITH CONTRAST TECHNIQUE: Multidetector CT imaging of the chest was performed using the standard protocol during bolus administration of intravenous contrast. Multiplanar CT image reconstructions and MIPs were obtained to evaluate the vascular anatomy. CONTRAST:  175mL OMNIPAQUE IOHEXOL 350 MG/ML SOLN COMPARISON:  None. FINDINGS: Cardiovascular: There is a optimal opacification of the pulmonary arteries. There is no central,segmental, or subsegmental filling defects within the pulmonary arteries. The heart is normal in size. A small pericardial effusion is present. No evidence right heart strain. There is normal three-vessel brachiocephalic anatomy without proximal stenosis. Scattered minimal aortic atherosclerosis is seen. There is also a small amount of calcifications seen within the aortic valve. Mediastinum/Nodes: No hilar, mediastinal, or axillary adenopathy. Thyroid gland, trachea, and esophagus demonstrate no significant findings. Lungs/Pleura: Mild interlobular septal thickening seen within  both lung apices. There is small bilateral pleural effusions, right greater than left. Adjacent patchy airspace consolidation seen at both lung bases. Upper Abdomen: No acute abnormalities present in the visualized portions of the upper abdomen. Musculoskeletal: No chest wall  abnormality. No acute or significant osseous findings. Review of the MIP images confirms the above findings. IMPRESSION: No central, segmental, or subsegmental pulmonary embolism. Small bilateral pleural effusions, right greater than left with findings that could be suggestive of mild interstitial edema. Small pericardial effusion Electronically Signed   By: Prudencio Pair M.D.   On: 01/14/2020 23:09   MR Lumbar Spine W Wo Contrast  Result Date: 01/10/2020 CLINICAL DATA:  Lumbar radiculopathy. EXAM: MRI LUMBAR SPINE WITHOUT AND WITH CONTRAST TECHNIQUE: Multiplanar and multiecho pulse sequences of the lumbar spine were obtained without and with intravenous contrast. CONTRAST:  7.88mL GADAVIST GADOBUTROL 1 MMOL/ML IV SOLN COMPARISON:  MRI 11/01/2019 FINDINGS: Segmentation:  Standard. Alignment: 2 mm of retrolisthesis of L2 on L3 and retrolisthesis of L3 on L4, similar to prior. Vertebrae:  No fracture, evidence of discitis, or bone lesion. Conus medullaris and cauda equina: Conus extends to the L1 level. Conus appears normal. Paraspinal and other soft tissues: No acute abnormality. Disc levels: T12-L1: No significant disc protrusion, foraminal stenosis, or canal stenosis. L1-L2: No significant disc protrusion, foraminal stenosis, or canal stenosis. L2-L3: Similar broad-based disc bulge with right lateral prominent disc osteophyte complex. Similar moderate right greater than left facet arthropathy. Resulting moderate right foraminal stenosis and right subarticular recess stenosis. Mild canal stenosis. L3-L4: Interval right micro discectomy with right laminotomy. Enhancing soft tissue in the right subarticular recess is consistent with scar tissue. There is significantly improved right subarticular recess stenosis. Bulky facet arthropathy results in mild right and severe left foraminal stenosis. No significant canal stenosis. L4-L5: Broad-based disc bulge with severe left and moderate right facet hypertrophy.  Bilateral subarticular recess stenosis. Severe left foraminal stenosis. Mild right foraminal stenosis. L5-S1: Severe bilateral facet hypertrophy without significant canal or foraminal stenosis. IMPRESSION: 1. 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 There is enhancing scar tissue in this region. 2. Otherwise, similar multilevel degenerative change including severe left foraminal stenosis at L3-L4 and L4-L5 and moderate right foraminal and subarticular recess stenosis at L2-L3. Electronically Signed   By: Margaretha Sheffield MD   On: 01/10/2020 22:37   DG Chest Port 1 View  Result Date: 01/13/2020 CLINICAL DATA:  Chest pain EXAM: PORTABLE CHEST 1 VIEW COMPARISON:  None. FINDINGS: The heart size and mediastinal contours are within normal limits. Low lung volumes. Minimal density at the right lung base probably reflects atelectasis. There is mild elevation of the right hemidiaphragm. No pleural effusion. No pneumothorax. IMPRESSION: Minimal density at the right lung base probably reflects atelectasis. Electronically Signed   By: Macy Mis M.D.   On: 01/13/2020 09:24   DG Chest Portable 1 View  Result Date: 01/10/2020 CLINICAL DATA:  Fever.  Recent back surgery. EXAM: PORTABLE CHEST 1 VIEW COMPARISON:  12/08/2019 FINDINGS: Heart size and pulmonary vascularity are normal. Shallow inspiration. Lungs are clear. No pleural effusions. No pneumothorax. Mediastinal contours appear intact. Degenerative changes in the spine and shoulders. IMPRESSION: Shallow inspiration. No evidence of active pulmonary disease. Electronically Signed   By: Lucienne Capers M.D.   On: 01/10/2020 22:36   CT RENAL STONE STUDY  Result Date: 01/14/2020 CLINICAL DATA:  69 year old female with history of sepsis. EXAM: CT ABDOMEN AND PELVIS WITHOUT CONTRAST TECHNIQUE: Multidetector CT imaging  of the abdomen and pelvis was performed following the standard protocol without IV  contrast. COMPARISON:  No priors. FINDINGS: Lower chest: Small bilateral pleural effusions lying dependently with areas of passive subsegmental atelectasis in the lower lobes of the lungs bilaterally. Aortic atherosclerosis. Hepatobiliary: Mild diffuse low attenuation throughout the hepatic parenchyma, indicative of hepatic steatosis. No definite suspicious cystic or solid hepatic lesions are confidently identified on today's noncontrast CT examination. Unenhanced appearance of the gallbladder is normal. Pancreas: No definite pancreatic mass or peripancreatic fluid collections or inflammatory changes are noted on today's noncontrast CT examination. Spleen: Unremarkable. Adrenals/Urinary Tract: 3 mm nonobstructive calculus in the interpolar collecting system of the left kidney. No additional calculi are noted within the right renal collecting system, along the course of either ureter, or within the lumen of the urinary bladder. Mild right hydroureteronephrosis with right-sided perinephric and periureteric soft tissue stranding. Unenhanced appearance of the left kidney and bilateral adrenal glands is otherwise normal. Urinary bladder is normal in appearance. Stomach/Bowel: Unenhanced appearance of the stomach is normal. No pathologic dilatation of small bowel or colon. Normal appendix. Vascular/Lymphatic: Aortic atherosclerosis. No lymphadenopathy noted in the abdomen or pelvis. Reproductive: Status post hysterectomy. Ovaries are not confidently identified may be surgically absent or atrophic. Other: Trace volume of ascites in the low anatomic pelvis. No pneumoperitoneum. Musculoskeletal: Chronic appearing compression fracture of L2 with 30% loss of anterior vertebral body height. There are no aggressive appearing lytic or blastic lesions noted in the visualized portions of the skeleton. IMPRESSION: 1. Mild right hydroureteronephrosis with right-sided perinephric soft tissue stranding. No calcified obstructive  nephrolithiasis in the right ureter to account for these findings. Clinical correlation for signs and symptoms of right-sided pyelonephritis is recommended. 2. Nonobstructive calculus measuring 3 mm in the interpolar collecting system of the left kidney. 3. Small bilateral pleural effusions with dependent areas of subsegmental atelectasis in the lower lobes of the lungs bilaterally. 4. Mild hepatic steatosis. 5. Aortic atherosclerosis. Electronically Signed   By: Vinnie Langton M.D.   On: 01/14/2020 15:41       Subjective: Patient seen and examined.  Sitting comfortably on the chair.  Tells me that she is doing well and has no complaints.  Eager to go home.   Discharge Exam: Vitals:   01/16/20 0745 01/16/20 1000  BP: (!) 144/62 (!) 148/71  Pulse: 75   Resp: 18   Temp: 98.3 F (36.8 C)   SpO2: 97%    Vitals:   01/15/20 1130 01/15/20 1500 01/16/20 0745 01/16/20 1000  BP: (!) 111/94 (!) 141/64 (!) 144/62 (!) 148/71  Pulse: 74 64 75   Resp: 16 18 18    Temp: 97.8 F (36.6 C) 98.7 F (37.1 C) 98.3 F (36.8 C)   TempSrc: Oral Oral Oral   SpO2: 98% 97% 97%   Weight:      Height:        General: Pt is alert, awake, not in acute distress Cardiovascular: RRR, S1/S2 +, no rubs, no gallops Respiratory: CTA bilaterally, no wheezing, no rhonchi Abdominal: Soft, NT, ND, bowel sounds + Extremities: no edema, no cyanosis    The results of significant diagnostics from this hospitalization (including imaging, microbiology, ancillary and laboratory) are listed below for reference.     Microbiology: Recent Results (from the past 240 hour(s))  Respiratory Panel by RT PCR (Flu A&B, Covid) - Nasopharyngeal Swab     Status: None   Collection Time: 01/10/20  7:58 PM   Specimen: Nasopharyngeal Swab  Result  Value Ref Range Status   SARS Coronavirus 2 by RT PCR NEGATIVE NEGATIVE Final    Comment: (NOTE) SARS-CoV-2 target nucleic acids are NOT DETECTED.  The SARS-CoV-2 RNA is generally  detectable in upper respiratoy specimens during the acute phase of infection. The lowest concentration of SARS-CoV-2 viral copies this assay can detect is 131 copies/mL. A negative result does not preclude SARS-Cov-2 infection and should not be used as the sole basis for treatment or other patient management decisions. A negative result may occur with  improper specimen collection/handling, submission of specimen other than nasopharyngeal swab, presence of viral mutation(s) within the areas targeted by this assay, and inadequate number of viral copies (<131 copies/mL). A negative result must be combined with clinical observations, patient history, and epidemiological information. The expected result is Negative.  Fact Sheet for Patients:  PinkCheek.be  Fact Sheet for Healthcare Providers:  GravelBags.it  This test is no t yet approved or cleared by the [place] FDA and  has been authorized for detection and/or diagnosis of SARS-CoV-2 by FDA under an Emergency Use Authorization (EUA). This EUA will remain  in effect (meaning this test can be used) for the duration of the COVID-19 declaration under Section 564(b)(1) of the Act, 21 U.S.C. section 360bbb-3(b)(1), unless the authorization is terminated or revoked sooner.     Influenza A by PCR NEGATIVE NEGATIVE Final   Influenza B by PCR NEGATIVE NEGATIVE Final    Comment: (NOTE) The Xpert Xpress SARS-CoV-2/FLU/RSV assay is intended as an aid in  the diagnosis of influenza from Nasopharyngeal swab specimens and  should not be used as a sole basis for treatment. Nasal washings and  aspirates are unacceptable for Xpert Xpress SARS-CoV-2/FLU/RSV  testing.  Fact Sheet for Patients: PinkCheek.be  Fact Sheet for Healthcare Providers: GravelBags.it  This test is not yet approved or cleared by the [place] FDA and   has been authorized for detection and/or diagnosis of SARS-CoV-2 by  FDA under an Emergency Use Authorization (EUA). This EUA will remain  in effect (meaning this test can be used) for the duration of the  Covid-19 declaration under Section 564(b)(1) of the Act, 21  U.S.C. section 360bbb-3(b)(1), unless the authorization is  terminated or revoked. Performed at [facility], [place] [address], [place], [place] [address]   Blood culture (routine x 2)     Status: Abnormal   Collection Time: 01/10/20  8:16 PM   Specimen: BLOOD  Result Value Ref Range Status   Specimen Description BLOOD LEFT ANTECUBITAL  Final   Special Requests   Final    BOTTLES DRAWN AEROBIC AND ANAEROBIC Blood Culture adequate volume   Culture  Setup Time   Final    GRAM NEGATIVE RODS IN BOTH AEROBIC AND ANAEROBIC BOTTLES CRITICAL RESULT CALLED TO, READ BACK BY AND VERIFIED WITH: [employer] [phone] Performed at [facility], [place] [address], [place], [place] [address]    Culture ESCHERICHIA COLI (A)  Final   Report Status 01/13/2020 FINAL  Final   Organism ID, Bacteria ESCHERICHIA COLI  Final      Susceptibility   Escherichia coli - MIC*    AMPICILLIN >=32 RESISTANT Resistant     CEFAZOLIN <=4 SENSITIVE Sensitive     CEFEPIME <=0.12 SENSITIVE Sensitive     CEFTAZIDIME <=1 SENSITIVE Sensitive     CEFTRIAXONE <=0.25 SENSITIVE Sensitive     CIPROFLOXACIN <=0.25 SENSITIVE Sensitive     GENTAMICIN <=1 SENSITIVE Sensitive  IMIPENEM <=0.25 SENSITIVE Sensitive     TRIMETH/SULFA <=20 SENSITIVE Sensitive     AMPICILLIN/SULBACTAM 16 INTERMEDIATE Intermediate     PIP/TAZO <=4 SENSITIVE Sensitive     * ESCHERICHIA COLI  Blood culture (routine x 2)     Status: Abnormal   Collection Time: 01/10/20  8:16 PM   Specimen: BLOOD LEFT HAND  Result Value Ref Range Status   Specimen Description BLOOD LEFT HAND  Final   Special Requests   Final    BOTTLES DRAWN AEROBIC AND ANAEROBIC Blood Culture  adequate volume   Culture  Setup Time   Final    GRAM NEGATIVE RODS IN BOTH AEROBIC AND ANAEROBIC BOTTLES CRITICAL VALUE NOTED.  VALUE IS CONSISTENT WITH PREVIOUSLY REPORTED AND CALLED VALUE.    Culture (A)  Final    ESCHERICHIA COLI SUSCEPTIBILITIES PERFORMED ON PREVIOUS CULTURE WITHIN THE LAST 5 DAYS. Performed at [facility], [place] [address] [place], [place] [address]    Report Status 01/13/2020 FINAL  Final  Blood Culture ID Panel (Reflexed)     Status: Abnormal   Collection Time: 01/10/20  8:16 PM  Result Value Ref Range Status   Enterococcus faecalis NOT DETECTED NOT DETECTED Final   Enterococcus Faecium NOT DETECTED NOT DETECTED Final   Listeria monocytogenes NOT DETECTED NOT DETECTED Final   Staphylococcus species NOT DETECTED NOT DETECTED Final   Staphylococcus aureus (BCID) NOT DETECTED NOT DETECTED Final   Staphylococcus epidermidis NOT DETECTED NOT DETECTED Final   Staphylococcus lugdunensis NOT DETECTED NOT DETECTED Final   Streptococcus species NOT DETECTED NOT DETECTED Final   Streptococcus agalactiae NOT DETECTED NOT DETECTED Final   Streptococcus pneumoniae NOT DETECTED NOT DETECTED Final   Streptococcus pyogenes NOT DETECTED NOT DETECTED Final   A.calcoaceticus-baumannii NOT DETECTED NOT DETECTED Final   Bacteroides fragilis NOT DETECTED NOT DETECTED Final   Enterobacterales DETECTED (A) NOT DETECTED Final    Comment: Enterobacterales represent a large order of gram negative bacteria, not a single organism. CRITICAL RESULT CALLED TO, READ BACK BY AND VERIFIED WITH: PHARMD J MILLEN [phone] MLM    Enterobacter cloacae complex NOT DETECTED NOT DETECTED Final   Escherichia coli DETECTED (A) NOT DETECTED Final    Comment: CRITICAL RESULT CALLED TO, READ BACK BY AND VERIFIED WITH: PHARMD J MILLEN [phone] MLM    Klebsiella aerogenes NOT DETECTED NOT DETECTED Final   Klebsiella oxytoca NOT DETECTED NOT DETECTED Final   Klebsiella pneumoniae NOT  DETECTED NOT DETECTED Final   Proteus species NOT DETECTED NOT DETECTED Final   Salmonella species NOT DETECTED NOT DETECTED Final   Serratia marcescens NOT DETECTED NOT DETECTED Final   Haemophilus influenzae NOT DETECTED NOT DETECTED Final   Neisseria meningitidis NOT DETECTED NOT DETECTED Final   Pseudomonas aeruginosa NOT DETECTED NOT DETECTED Final   Stenotrophomonas maltophilia NOT DETECTED NOT DETECTED Final   Candida albicans NOT DETECTED NOT DETECTED Final   Candida auris NOT DETECTED NOT DETECTED Final   Candida glabrata NOT DETECTED NOT DETECTED Final   Candida krusei NOT DETECTED NOT DETECTED Final   Candida parapsilosis NOT DETECTED NOT DETECTED Final   Candida tropicalis NOT DETECTED NOT DETECTED Final   Cryptococcus neoformans/gattii NOT DETECTED NOT DETECTED Final   CTX-M ESBL NOT DETECTED NOT DETECTED Final   Carbapenem resistance IMP NOT DETECTED NOT DETECTED Final   Carbapenem resistance KPC NOT DETECTED NOT DETECTED Final   Carbapenem resistance NDM NOT DETECTED NOT DETECTED Final   Carbapenem resist OXA 48 LIKE NOT  DETECTED NOT DETECTED Final   Carbapenem resistance VIM NOT DETECTED NOT DETECTED Final    Comment: Performed at [facility], [place] [address], [place], [place] [address]  Urine culture     Status: Abnormal   Collection Time: 01/10/20 11:55 PM   Specimen: Urine, Random  Result Value Ref Range Status   Specimen Description URINE, RANDOM  Final   Special Requests   Final    NONE Performed at [facility], [place] [address] [place], [place] [address]    Culture >=100,000 COLONIES/mL ESCHERICHIA COLI (A)  Final   Report Status 01/13/2020 FINAL  Final   Organism ID, Bacteria ESCHERICHIA COLI (A)  Final      Susceptibility   Escherichia coli - MIC*    AMPICILLIN >=32 RESISTANT Resistant     CEFAZOLIN <=4 SENSITIVE Sensitive     CEFTRIAXONE <=0.25 SENSITIVE Sensitive     CIPROFLOXACIN <=0.25 SENSITIVE Sensitive     GENTAMICIN <=1 SENSITIVE  Sensitive     IMIPENEM <=0.25 SENSITIVE Sensitive     NITROFURANTOIN <=16 SENSITIVE Sensitive     TRIMETH/SULFA <=20 SENSITIVE Sensitive     AMPICILLIN/SULBACTAM 16 INTERMEDIATE Intermediate     PIP/TAZO <=4 SENSITIVE Sensitive     * >=100,000 COLONIES/mL ESCHERICHIA COLI  C Difficile Quick Screen (NO PCR Reflex)     Status: Abnormal   Collection Time: 01/13/20 11:41 AM   Specimen: STOOL  Result Value Ref Range Status   C Diff antigen POSITIVE (A) NEGATIVE Final   C Diff toxin NEGATIVE NEGATIVE Final   C Diff interpretation   Final    Results are indeterminate. Please contact your campus specific ID physician or [place].    Comment: Performed at [facility], [place] [address], [place], [place] [address]     Labs: BNP (last 3 results) No results for input(s): BNP in the last 8760 hours. Basic Metabolic Panel: Recent Labs  Lab 01/12/20 0446 01/13/20 0507 01/14/20 0420 01/15/20 0404 01/16/20 0249  NA 134* 136 136 136 137  K 4.4 3.2* 3.3* 3.7 3.1*  CL 106 108 106 104 102  CO2 17* 21* 22 24 26   GLUCOSE 99 82 82 125* 166*  BUN 18 13 11 8  7*  CREATININE 1.29* 1.11* 1.11* 1.14* 1.07*  CALCIUM 8.2* 7.9* 7.9* 8.1* 8.0*  MG  --   --   --   --  1.6*   Liver Function Tests: Recent Labs  Lab 01/10/20 2016 01/11/20 0330  AST 13* 12*  ALT 16 14  ALKPHOS 98 87  BILITOT 1.2 0.6  PROT 6.5 5.4*  ALBUMIN 2.5* 1.9*   Recent Labs  Lab 01/10/20 2016  LIPASE 16   No results for input(s): AMMONIA in the last 168 hours. CBC: Recent Labs  Lab 01/10/20 2016 01/11/20 0330 01/12/20 0446 01/13/20 0507 01/14/20 0420 01/15/20 0404 01/16/20 0249  WBC 18.5*   < > 17.5* 13.7* 13.3* 12.8* 11.7*  NEUTROABS 16.2*  --   --   --   --   --   --   HGB 14.1   < > 13.1 10.5* 10.5* 11.3* 11.6*  HCT 41.8   < > 39.5 31.1* 31.6* 34.0* 34.7*  MCV 88.2   < > 92.5 88.1 88.3 88.8 88.3  PLT 220   < > 164 199 260 340 404*   < > = values in this interval not displayed.   Cardiac Enzymes: No  results for input(s): CKTOTAL, CKMB, CKMBINDEX, TROPONINI in  the last 168 hours. BNP: Invalid input(s): POCBNP CBG: Recent Labs  Lab 01/15/20 0648 01/15/20 1155 01/15/20 1533 01/15/20 2120 01/16/20 0710  GLUCAP 129* 145* 208* 184* 160*   D-Dimer No results for input(s): DDIMER in the last 72 hours. Hgb A1c No results for input(s): HGBA1C in the last 72 hours. Lipid Profile No results for input(s): CHOL, HDL, LDLCALC, TRIG, CHOLHDL, LDLDIRECT in the last 72 hours. Thyroid function studies No results for input(s): TSH, T4TOTAL, T3FREE, THYROIDAB in the last 72 hours.  Invalid input(s): FREET3 Anemia work up No results for input(s): VITAMINB12, FOLATE, FERRITIN, TIBC, IRON, RETICCTPCT in the last 72 hours. Urinalysis    Component Value Date/Time   COLORURINE YELLOW 01/10/2020 2355   APPEARANCEUR HAZY (A) 01/10/2020 2355   LABSPEC 1.018 01/10/2020 2355   PHURINE 5.0 01/10/2020 2355   GLUCOSEU >=500 (A) 01/10/2020 2355   HGBUR MODERATE (A) 01/10/2020 2355   BILIRUBINUR NEGATIVE 01/10/2020 2355   KETONESUR 5 (A) 01/10/2020 2355   PROTEINUR 100 (A) 01/10/2020 2355   NITRITE NEGATIVE 01/10/2020 2355   LEUKOCYTESUR MODERATE (A) 01/10/2020 2355   Sepsis Labs Invalid input(s): PROCALCITONIN,  WBC,  LACTICIDVEN Microbiology Recent Results (from the past 240 hour(s))  Respiratory Panel by RT PCR (Flu A&B, Covid) - Nasopharyngeal Swab     Status: None   Collection Time: 01/10/20  7:58 PM   Specimen: Nasopharyngeal Swab  Result Value Ref Range Status   SARS Coronavirus 2 by RT PCR NEGATIVE NEGATIVE Final    Comment: (NOTE) SARS-CoV-2 target nucleic acids are NOT DETECTED.  The SARS-CoV-2 RNA is generally detectable in upper respiratoy specimens during the acute phase of infection. The lowest concentration of SARS-CoV-2 viral copies this assay can detect is 131 copies/mL. A negative result does not preclude SARS-Cov-2 infection and should not be used as the sole basis for  treatment or other patient management decisions. A negative result may occur with  improper specimen collection/handling, submission of specimen other than nasopharyngeal swab, presence of viral mutation(s) within the areas targeted by this assay, and inadequate number of viral copies (<131 copies/mL). A negative result must be combined with clinical observations, patient history, and epidemiological information. The expected result is Negative.  Fact Sheet for Patients:  PinkCheek.be  Fact Sheet for Healthcare Providers:  GravelBags.it  This test is no t yet approved or cleared by the [place] FDA and  has been authorized for detection and/or diagnosis of SARS-CoV-2 by FDA under an Emergency Use Authorization (EUA). This EUA will remain  in effect (meaning this test can be used) for the duration of the COVID-19 declaration under Section 564(b)(1) of the Act, 21 U.S.C. section 360bbb-3(b)(1), unless the authorization is terminated or revoked sooner.     Influenza A by PCR NEGATIVE NEGATIVE Final   Influenza B by PCR NEGATIVE NEGATIVE Final    Comment: (NOTE) The Xpert Xpress SARS-CoV-2/FLU/RSV assay is intended as an aid in  the diagnosis of influenza from Nasopharyngeal swab specimens and  should not be used as a sole basis for treatment. Nasal washings and  aspirates are unacceptable for Xpert Xpress SARS-CoV-2/FLU/RSV  testing.  Fact Sheet for Patients: PinkCheek.be  Fact Sheet for Healthcare Providers: GravelBags.it  This test is not yet approved or cleared by the [place] FDA and  has been authorized for detection and/or diagnosis of SARS-CoV-2 by  FDA under an Emergency Use Authorization (EUA). This EUA will remain  in effect (meaning this test can be used) for the duration of  the  Covid-19 declaration under Section 564(b)(1) of the Act, 21   U.S.C. section 360bbb-3(b)(1), unless the authorization is  terminated or revoked. Performed at [facility], [place] [address], [place], [place] [address]   Blood culture (routine x 2)     Status: Abnormal   Collection Time: 01/10/20  8:16 PM   Specimen: BLOOD  Result Value Ref Range Status   Specimen Description BLOOD LEFT ANTECUBITAL  Final   Special Requests   Final    BOTTLES DRAWN AEROBIC AND ANAEROBIC Blood Culture adequate volume   Culture  Setup Time   Final    GRAM NEGATIVE RODS IN BOTH AEROBIC AND ANAEROBIC BOTTLES CRITICAL RESULT CALLED TO, READ BACK BY AND VERIFIED WITH: [place] [phone] MLM Performed at [facility], [place] [address], [place], [place] [address]    Culture ESCHERICHIA COLI (A)  Final   Report Status 01/13/2020 FINAL  Final   Organism ID, Bacteria ESCHERICHIA COLI  Final      Susceptibility   Escherichia coli - MIC*    AMPICILLIN >=32 RESISTANT Resistant     CEFAZOLIN <=4 SENSITIVE Sensitive     CEFEPIME <=0.12 SENSITIVE Sensitive     CEFTAZIDIME <=1 SENSITIVE Sensitive     CEFTRIAXONE <=0.25 SENSITIVE Sensitive     CIPROFLOXACIN <=0.25 SENSITIVE Sensitive     GENTAMICIN <=1 SENSITIVE Sensitive     IMIPENEM <=0.25 SENSITIVE Sensitive     TRIMETH/SULFA <=20 SENSITIVE Sensitive     AMPICILLIN/SULBACTAM 16 INTERMEDIATE Intermediate     PIP/TAZO <=4 SENSITIVE Sensitive     * ESCHERICHIA COLI  Blood culture (routine x 2)     Status: Abnormal   Collection Time: 01/10/20  8:16 PM   Specimen: BLOOD LEFT HAND  Result Value Ref Range Status   Specimen Description BLOOD LEFT HAND  Final   Special Requests   Final    BOTTLES DRAWN AEROBIC AND ANAEROBIC Blood Culture adequate volume   Culture  Setup Time   Final    GRAM NEGATIVE RODS IN BOTH AEROBIC AND ANAEROBIC BOTTLES CRITICAL VALUE NOTED.  VALUE IS CONSISTENT WITH PREVIOUSLY REPORTED AND CALLED VALUE.    Culture (A)  Final    ESCHERICHIA COLI SUSCEPTIBILITIES PERFORMED ON  PREVIOUS CULTURE WITHIN THE LAST 5 DAYS. Performed at [facility], [place] [address], [place], [place] [address]    Report Status 01/13/2020 FINAL  Final  Blood Culture ID Panel (Reflexed)     Status: Abnormal   Collection Time: 01/10/20  8:16 PM  Result Value Ref Range Status   Enterococcus faecalis NOT DETECTED NOT DETECTED Final   Enterococcus Faecium NOT DETECTED NOT DETECTED Final   Listeria monocytogenes NOT DETECTED NOT DETECTED Final   Staphylococcus species NOT DETECTED NOT DETECTED Final   Staphylococcus aureus (BCID) NOT DETECTED NOT DETECTED Final   Staphylococcus epidermidis NOT DETECTED NOT DETECTED Final   Staphylococcus lugdunensis NOT DETECTED NOT DETECTED Final   Streptococcus species NOT DETECTED NOT DETECTED Final   Streptococcus agalactiae NOT DETECTED NOT DETECTED Final   Streptococcus pneumoniae NOT DETECTED NOT DETECTED Final   Streptococcus pyogenes NOT DETECTED NOT DETECTED Final   A.calcoaceticus-baumannii NOT DETECTED NOT DETECTED Final   Bacteroides fragilis NOT DETECTED NOT DETECTED Final   Enterobacterales DETECTED (A) NOT DETECTED Final    Comment: Enterobacterales represent a large order of gram negative bacteria, not a single organism. CRITICAL RESULT CALLED TO, READ BACK BY AND VERIFIED WITH: [place] [phone]  MLM    Enterobacter cloacae complex NOT DETECTED NOT DETECTED Final   Escherichia coli DETECTED (A) NOT DETECTED Final    Comment: CRITICAL RESULT CALLED TO, READ BACK BY AND VERIFIED WITH: PHARMD J MILLEN [phone] MLM    Klebsiella aerogenes NOT DETECTED NOT DETECTED Final   Klebsiella oxytoca NOT DETECTED NOT DETECTED Final   Klebsiella pneumoniae NOT DETECTED NOT DETECTED Final   Proteus species NOT DETECTED NOT DETECTED Final   Salmonella species NOT DETECTED NOT DETECTED Final   Serratia marcescens NOT DETECTED NOT DETECTED Final   Haemophilus influenzae NOT DETECTED NOT DETECTED Final   Neisseria meningitidis NOT  DETECTED NOT DETECTED Final   Pseudomonas aeruginosa NOT DETECTED NOT DETECTED Final   Stenotrophomonas maltophilia NOT DETECTED NOT DETECTED Final   Candida albicans NOT DETECTED NOT DETECTED Final   Candida auris NOT DETECTED NOT DETECTED Final   Candida glabrata NOT DETECTED NOT DETECTED Final   Candida krusei NOT DETECTED NOT DETECTED Final   Candida parapsilosis NOT DETECTED NOT DETECTED Final   Candida tropicalis NOT DETECTED NOT DETECTED Final   Cryptococcus neoformans/gattii NOT DETECTED NOT DETECTED Final   CTX-M ESBL NOT DETECTED NOT DETECTED Final   Carbapenem resistance IMP NOT DETECTED NOT DETECTED Final   Carbapenem resistance KPC NOT DETECTED NOT DETECTED Final   Carbapenem resistance NDM NOT DETECTED NOT DETECTED Final   Carbapenem resist OXA 48 LIKE NOT DETECTED NOT DETECTED Final   Carbapenem resistance VIM NOT DETECTED NOT DETECTED Final    Comment: Performed at [facility], [address], [place], [place] [address]  Urine culture     Status: Abnormal   Collection Time: 01/10/20 11:55 PM   Specimen: Urine, Random  Result Value Ref Range Status   Specimen Description URINE, RANDOM  Final   Special Requests   Final    NONE Performed at [facility], [place] [address] [place], [place] [address]    Culture >=100,000 COLONIES/mL ESCHERICHIA COLI (A)  Final   Report Status 01/13/2020 FINAL  Final   Organism ID, Bacteria ESCHERICHIA COLI (A)  Final      Susceptibility   Escherichia coli - MIC*    AMPICILLIN >=32 RESISTANT Resistant     CEFAZOLIN <=4 SENSITIVE Sensitive     CEFTRIAXONE <=0.25 SENSITIVE Sensitive     CIPROFLOXACIN <=0.25 SENSITIVE Sensitive     GENTAMICIN <=1 SENSITIVE Sensitive     IMIPENEM <=0.25 SENSITIVE Sensitive     NITROFURANTOIN <=16 SENSITIVE Sensitive     TRIMETH/SULFA <=20 SENSITIVE Sensitive     AMPICILLIN/SULBACTAM 16 INTERMEDIATE Intermediate     PIP/TAZO <=4 SENSITIVE Sensitive     * >=100,000 COLONIES/mL ESCHERICHIA COLI   C Difficile Quick Screen (NO PCR Reflex)     Status: Abnormal   Collection Time: 01/13/20 11:41 AM   Specimen: STOOL  Result Value Ref Range Status   C Diff antigen POSITIVE (A) NEGATIVE Final   C Diff toxin NEGATIVE NEGATIVE Final   C Diff interpretation   Final    Results are indeterminate. Please contact your campus specific ID physician or [place].    Comment: Performed at [facility], [place] [address] [place], [place] [address]     Time coordinating discharge: Over 30 minutes  SIGNED:   Mckinley Jewel, MD  [facility] 01/16/2020, 10:20 AM Pager   If 7PM-7AM, please contact night-coverage [email or web address]

## 2020-01-16 NOTE — Plan of Care (Signed)

## 2020-01-16 NOTE — Progress Notes (Signed)
PT Cancellation Note  Patient Details Name: FLOYE FESLER MRN: [id number] DOB: 23-Aug-1950   Cancelled Treatment:    Reason Eval/Treat Not Completed: Patient declined, no reason specified. PT attempted to see pt this morning, pt reports she is discharging home and does not want to participate in PT at this time. PT will attempt to follow up with patient for the remainder of this admission.   Zenaida Niece 01/16/2020, 10:26 AM

## 2020-01-16 NOTE — Progress Notes (Signed)
Patient was offered both the Pneumococcal and Flu vaccine before discharge home. She refused both immunizations. Patient was educated on the importance of receiving the two vaccinations.

## 2020-01-16 NOTE — Plan of Care (Signed)
Problem: Education: Goal: Knowledge of General Education information will improve Description: Including pain rating scale, medication(s)/side effects and non-pharmacologic comfort measures 01/16/2020 1438 by Cassell Smiles, RN Outcome: Adequate for Discharge 01/16/2020 1437 by Cassell Smiles, RN Outcome: Progressing   Problem: Health Behavior/Discharge Planning: Goal: Ability to manage health-related needs will improve 01/16/2020 1438 by Cassell Smiles, RN Outcome: Adequate for Discharge 01/16/2020 1437 by Cassell Smiles, RN Outcome: Progressing   Problem: Clinical Measurements: Goal: Ability to maintain clinical measurements within normal limits will improve 01/16/2020 1438 by Cassell Smiles, RN Outcome: Adequate for Discharge 01/16/2020 1437 by Cassell Smiles, RN Outcome: Progressing Goal: Will remain free from infection 01/16/2020 1438 by Cassell Smiles, RN Outcome: Adequate for Discharge 01/16/2020 1437 by Cassell Smiles, RN Outcome: Progressing Goal: Diagnostic test results will improve 01/16/2020 1438 by Cassell Smiles, RN Outcome: Adequate for Discharge 01/16/2020 1437 by Cassell Smiles, RN Outcome: Progressing Goal: Cardiovascular complication will be avoided 01/16/2020 1438 by Cassell Smiles, RN Outcome: Adequate for Discharge 01/16/2020 1437 by Cassell Smiles, RN Outcome: Progressing   Problem: Activity: Goal: Risk for activity intolerance will decrease 01/16/2020 1438 by Cassell Smiles, RN Outcome: Adequate for Discharge 01/16/2020 1437 by Cassell Smiles, RN Outcome: Progressing   Problem: Nutrition: Goal: Adequate nutrition will be maintained 01/16/2020 1438 by Cassell Smiles, RN Outcome: Adequate for Discharge 01/16/2020 1437 by Cassell Smiles, RN Outcome: Progressing   Problem: Coping: Goal: Level of anxiety will decrease 01/16/2020 1438 by Cassell Smiles, RN Outcome: Adequate for Discharge 01/16/2020 1437 by  Cassell Smiles, RN Outcome: Progressing   Problem: Elimination: Goal: Will not experience complications related to bowel motility 01/16/2020 1438 by Cassell Smiles, RN Outcome: Adequate for Discharge 01/16/2020 1437 by Cassell Smiles, RN Outcome: Progressing Goal: Will not experience complications related to urinary retention 01/16/2020 1438 by Cassell Smiles, RN Outcome: Adequate for Discharge 01/16/2020 1437 by Cassell Smiles, RN Outcome: Progressing   Problem: Pain Managment: Goal: General experience of comfort will improve 01/16/2020 1438 by Cassell Smiles, RN Outcome: Adequate for Discharge 01/16/2020 1437 by Cassell Smiles, RN Outcome: Progressing   Problem: Safety: Goal: Ability to remain free from injury will improve 01/16/2020 1438 by Cassell Smiles, RN Outcome: Adequate for Discharge 01/16/2020 1437 by Cassell Smiles, RN Outcome: Progressing   Problem: Skin Integrity: Goal: Risk for impaired skin integrity will decrease 01/16/2020 1438 by Cassell Smiles, RN Outcome: Adequate for Discharge 01/16/2020 1437 by Cassell Smiles, RN Outcome: Progressing   Problem: Education: Goal: Knowledge of General Education information will improve Description: Including pain rating scale, medication(s)/side effects and non-pharmacologic comfort measures 01/16/2020 1438 by Cassell Smiles, RN Outcome: Adequate for Discharge 01/16/2020 1437 by Cassell Smiles, RN Outcome: Progressing   Problem: Health Behavior/Discharge Planning: Goal: Ability to manage health-related needs will improve 01/16/2020 1438 by Cassell Smiles, RN Outcome: Adequate for Discharge 01/16/2020 1437 by Cassell Smiles, RN Outcome: Progressing   Problem: Clinical Measurements: Goal: Ability to maintain clinical measurements within normal limits will improve 01/16/2020 1438 by Cassell Smiles, RN Outcome: Adequate for Discharge 01/16/2020 1437 by Cassell Smiles,  RN Outcome: Progressing Goal: Will remain free from infection 01/16/2020 1438 by Cassell Smiles, RN Outcome: Adequate for Discharge 01/16/2020 1437 by Cassell Smiles, RN Outcome: Progressing Goal: Diagnostic test results will improve 01/16/2020 1438 by Cassell Smiles, RN Outcome: Adequate for Discharge 01/16/2020 1437  by Cassell Smiles, RN Outcome: Progressing Goal: Respiratory complications will improve 01/16/2020 1438 by Cassell Smiles, RN Outcome: Adequate for Discharge 01/16/2020 1437 by Cassell Smiles, RN Outcome: Progressing Goal: Cardiovascular complication will be avoided 01/16/2020 1438 by Cassell Smiles, RN Outcome: Adequate for Discharge 01/16/2020 1437 by Cassell Smiles, RN Outcome: Progressing   Problem: Activity: Goal: Risk for activity intolerance will decrease 01/16/2020 1438 by Cassell Smiles, RN Outcome: Adequate for Discharge 01/16/2020 1437 by Cassell Smiles, RN Outcome: Progressing   Problem: Nutrition: Goal: Adequate nutrition will be maintained 01/16/2020 1438 by Cassell Smiles, RN Outcome: Adequate for Discharge 01/16/2020 1437 by Cassell Smiles, RN Outcome: Progressing   Problem: Coping: Goal: Level of anxiety will decrease 01/16/2020 1438 by Cassell Smiles, RN Outcome: Adequate for Discharge 01/16/2020 1437 by Cassell Smiles, RN Outcome: Progressing   Problem: Elimination: Goal: Will not experience complications related to bowel motility 01/16/2020 1438 by Cassell Smiles, RN Outcome: Adequate for Discharge 01/16/2020 1437 by Cassell Smiles, RN Outcome: Progressing Goal: Will not experience complications related to urinary retention 01/16/2020 1438 by Cassell Smiles, RN Outcome: Adequate for Discharge 01/16/2020 1437 by Cassell Smiles, RN Outcome: Progressing   Problem: Pain Managment: Goal: General experience of comfort will improve 01/16/2020 1438 by Cassell Smiles, RN Outcome: Adequate for  Discharge 01/16/2020 1437 by Cassell Smiles, RN Outcome: Progressing   Problem: Safety: Goal: Ability to remain free from injury will improve 01/16/2020 1438 by Cassell Smiles, RN Outcome: Adequate for Discharge 01/16/2020 1437 by Cassell Smiles, RN Outcome: Progressing   Problem: Skin Integrity: Goal: Risk for impaired skin integrity will decrease 01/16/2020 1438 by Cassell Smiles, RN Outcome: Adequate for Discharge 01/16/2020 1437 by Cassell Smiles, RN Outcome: Progressing

## 2020-01-18 DIAGNOSIS — G9341 Metabolic encephalopathy: Secondary | ICD-10-CM | POA: Diagnosis not present

## 2020-01-18 DIAGNOSIS — Z7984 Long term (current) use of oral hypoglycemic drugs: Secondary | ICD-10-CM | POA: Diagnosis not present

## 2020-01-18 DIAGNOSIS — N136 Pyonephrosis: Secondary | ICD-10-CM | POA: Diagnosis not present

## 2020-01-18 DIAGNOSIS — A4151 Sepsis due to Escherichia coli [E. coli]: Secondary | ICD-10-CM | POA: Diagnosis not present

## 2020-01-18 DIAGNOSIS — G4700 Insomnia, unspecified: Secondary | ICD-10-CM | POA: Diagnosis not present

## 2020-01-18 DIAGNOSIS — M48061 Spinal stenosis, lumbar region without neurogenic claudication: Secondary | ICD-10-CM | POA: Diagnosis not present

## 2020-01-18 DIAGNOSIS — F17210 Nicotine dependence, cigarettes, uncomplicated: Secondary | ICD-10-CM | POA: Diagnosis not present

## 2020-01-18 DIAGNOSIS — G2581 Restless legs syndrome: Secondary | ICD-10-CM | POA: Diagnosis not present

## 2020-01-18 DIAGNOSIS — I10 Essential (primary) hypertension: Secondary | ICD-10-CM | POA: Diagnosis not present

## 2020-01-18 DIAGNOSIS — L409 Psoriasis, unspecified: Secondary | ICD-10-CM | POA: Diagnosis not present

## 2020-01-18 DIAGNOSIS — E876 Hypokalemia: Secondary | ICD-10-CM | POA: Diagnosis not present

## 2020-01-18 DIAGNOSIS — M797 Fibromyalgia: Secondary | ICD-10-CM | POA: Diagnosis not present

## 2020-01-18 DIAGNOSIS — G8929 Other chronic pain: Secondary | ICD-10-CM | POA: Diagnosis not present

## 2020-01-18 DIAGNOSIS — E1165 Type 2 diabetes mellitus with hyperglycemia: Secondary | ICD-10-CM | POA: Diagnosis not present

## 2020-01-18 DIAGNOSIS — N200 Calculus of kidney: Secondary | ICD-10-CM | POA: Diagnosis not present

## 2020-01-23 DIAGNOSIS — N12 Tubulo-interstitial nephritis, not specified as acute or chronic: Secondary | ICD-10-CM | POA: Diagnosis not present

## 2020-01-23 DIAGNOSIS — R197 Diarrhea, unspecified: Secondary | ICD-10-CM | POA: Diagnosis not present

## 2020-01-23 DIAGNOSIS — G9340 Encephalopathy, unspecified: Secondary | ICD-10-CM | POA: Diagnosis not present

## 2020-01-23 DIAGNOSIS — A4151 Sepsis due to Escherichia coli [E. coli]: Secondary | ICD-10-CM | POA: Diagnosis not present

## 2020-01-27 DIAGNOSIS — N1831 Chronic kidney disease, stage 3a: Secondary | ICD-10-CM | POA: Diagnosis not present

## 2020-02-06 DIAGNOSIS — E1151 Type 2 diabetes mellitus with diabetic peripheral angiopathy without gangrene: Secondary | ICD-10-CM | POA: Diagnosis not present

## 2020-02-11 DIAGNOSIS — M5416 Radiculopathy, lumbar region: Secondary | ICD-10-CM | POA: Diagnosis not present

## 2020-02-11 DIAGNOSIS — M7061 Trochanteric bursitis, right hip: Secondary | ICD-10-CM | POA: Diagnosis not present

## 2020-02-23 DIAGNOSIS — I1310 Hypertensive heart and chronic kidney disease without heart failure, with stage 1 through stage 4 chronic kidney disease, or unspecified chronic kidney disease: Secondary | ICD-10-CM | POA: Diagnosis not present

## 2020-02-23 DIAGNOSIS — D61818 Other pancytopenia: Secondary | ICD-10-CM | POA: Diagnosis not present

## 2020-02-23 DIAGNOSIS — E1151 Type 2 diabetes mellitus with diabetic peripheral angiopathy without gangrene: Secondary | ICD-10-CM | POA: Diagnosis not present

## 2020-02-23 DIAGNOSIS — N1831 Chronic kidney disease, stage 3a: Secondary | ICD-10-CM | POA: Diagnosis not present

## 2020-02-23 DIAGNOSIS — D75839 Thrombocytosis, unspecified: Secondary | ICD-10-CM | POA: Diagnosis not present

## 2020-02-24 DIAGNOSIS — M5416 Radiculopathy, lumbar region: Secondary | ICD-10-CM | POA: Diagnosis not present

## 2020-02-26 ENCOUNTER — Telehealth: Payer: Self-pay | Admitting: Hematology

## 2020-02-26 NOTE — Telephone Encounter (Signed)
Received a new hem referral from [facility] for pancytopenia. Anna [name] has a last name of Anna [name], which she stated was her maiden name but has had the last name Anna [name] for years. She has been scheduled to see Dr. Irene Limbo on 12/14 at 11:20am. Pt aware to arrive 20 minutes early.

## 2020-02-27 DIAGNOSIS — E1151 Type 2 diabetes mellitus with diabetic peripheral angiopathy without gangrene: Secondary | ICD-10-CM | POA: Diagnosis not present

## 2020-03-01 ENCOUNTER — Encounter (HOSPITAL_COMMUNITY): Payer: Self-pay

## 2020-03-02 ENCOUNTER — Inpatient Hospital Stay: Payer: Medicare Other | Attending: Hematology | Admitting: Hematology

## 2020-03-02 ENCOUNTER — Inpatient Hospital Stay: Payer: Medicare Other

## 2020-03-02 ENCOUNTER — Other Ambulatory Visit: Payer: Self-pay

## 2020-03-02 VITALS — BP 148/91 | HR 117 | Temp 97.8°F | Resp 17 | Ht 64.0 in | Wt 153.3 lb

## 2020-03-02 DIAGNOSIS — E1122 Type 2 diabetes mellitus with diabetic chronic kidney disease: Secondary | ICD-10-CM | POA: Insufficient documentation

## 2020-03-02 DIAGNOSIS — M1990 Unspecified osteoarthritis, unspecified site: Secondary | ICD-10-CM | POA: Diagnosis not present

## 2020-03-02 DIAGNOSIS — M797 Fibromyalgia: Secondary | ICD-10-CM | POA: Insufficient documentation

## 2020-03-02 DIAGNOSIS — I129 Hypertensive chronic kidney disease with stage 1 through stage 4 chronic kidney disease, or unspecified chronic kidney disease: Secondary | ICD-10-CM | POA: Diagnosis not present

## 2020-03-02 DIAGNOSIS — E1136 Type 2 diabetes mellitus with diabetic cataract: Secondary | ICD-10-CM | POA: Insufficient documentation

## 2020-03-02 DIAGNOSIS — N189 Chronic kidney disease, unspecified: Secondary | ICD-10-CM | POA: Diagnosis not present

## 2020-03-02 DIAGNOSIS — G2581 Restless legs syndrome: Secondary | ICD-10-CM | POA: Diagnosis not present

## 2020-03-02 DIAGNOSIS — D75839 Thrombocytosis, unspecified: Secondary | ICD-10-CM | POA: Diagnosis not present

## 2020-03-02 LAB — IRON AND TIBC
Iron: 83 ug/dL (ref 41–142)
Saturation Ratios: 22 % (ref 21–57)
TIBC: 376 ug/dL (ref 236–444)
UIBC: 294 ug/dL (ref 120–384)

## 2020-03-02 LAB — CBC WITH DIFFERENTIAL/PLATELET
Abs Immature Granulocytes: 0.06 10*3/uL (ref 0.00–0.07)
Basophils Absolute: 0.1 10*3/uL (ref 0.0–0.1)
Basophils Relative: 1 %
Eosinophils Absolute: 0.1 10*3/uL (ref 0.0–0.5)
Eosinophils Relative: 1 %
HCT: 43.1 % (ref 36.0–46.0)
Hemoglobin: 14 g/dL (ref 12.0–15.0)
Immature Granulocytes: 1 %
Lymphocytes Relative: 16 %
Lymphs Abs: 2 10*3/uL (ref 0.7–4.0)
MCH: 29.6 pg (ref 26.0–34.0)
MCHC: 32.5 g/dL (ref 30.0–36.0)
MCV: 91.1 fL (ref 80.0–100.0)
Monocytes Absolute: 0.6 10*3/uL (ref 0.1–1.0)
Monocytes Relative: 5 %
Neutro Abs: 9.5 10*3/uL — ABNORMAL HIGH (ref 1.7–7.7)
Neutrophils Relative %: 76 %
Platelets: 547 10*3/uL — ABNORMAL HIGH (ref 150–400)
RBC: 4.73 MIL/uL (ref 3.87–5.11)
RDW: 14.1 % (ref 11.5–15.5)
WBC: 12.3 10*3/uL — ABNORMAL HIGH (ref 4.0–10.5)
nRBC: 0 % (ref 0.0–0.2)

## 2020-03-02 LAB — CMP (CANCER CENTER ONLY)
ALT: 18 U/L (ref 0–44)
AST: 11 U/L — ABNORMAL LOW (ref 15–41)
Albumin: 4 g/dL (ref 3.5–5.0)
Alkaline Phosphatase: 110 U/L (ref 38–126)
Anion gap: 6 (ref 5–15)
BUN: 21 mg/dL (ref 8–23)
CO2: 27 mmol/L (ref 22–32)
Calcium: 10.9 mg/dL — ABNORMAL HIGH (ref 8.9–10.3)
Chloride: 102 mmol/L (ref 98–111)
Creatinine: 1.42 mg/dL — ABNORMAL HIGH (ref 0.44–1.00)
GFR, Estimated: 40 mL/min — ABNORMAL LOW (ref >60)
Glucose, Bld: 211 mg/dL — ABNORMAL HIGH (ref 70–99)
Potassium: 4.9 mmol/L (ref 3.5–5.1)
Sodium: 135 mmol/L (ref 135–145)
Total Bilirubin: 0.5 mg/dL (ref 0.3–1.2)
Total Protein: 8.4 g/dL — ABNORMAL HIGH (ref 6.5–8.1)

## 2020-03-02 LAB — FERRITIN: Ferritin: 162 ng/mL (ref 11–307)
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
## 2020-03-02 NOTE — Patient Instructions (Signed)
Thank you for choosing [facility] to provide your oncology and hematology care.   Should you have questions after your visit to the [facility] ([facility]), please contact this office at [phone] between 8:30 AM and 4:30 PM.  Voice mails left after 4:00 PM may not be returned until the following business day.  Calls received after 4:30 PM will be answered by an off-site Nurse Triage Line.    Prescription Refills:  Please have your pharmacy contact us directly for most prescription requests.  Contact the office directly for refills of narcotics (pain medications). Allow 48-72 hours for refills.  Appointments: Please contact the [facility] scheduling department [phone] for questions regarding [facility] appointment scheduling.  Contact the schedulers with any scheduling changes so that your appointment can be rescheduled in a timely manner.   Central Scheduling for [facility] [phone] - Call to schedule procedures such as PET scans, CT scans, MRI, Ultrasound, etc.  To afford each patient quality time with our providers, please arrive 30 minutes before your scheduled appointment time.  If you arrive late for your appointment, you may be asked to reschedule.  We strive to give you quality time with our providers, and arriving late affects you and other patients whose appointments are after yours. If you are a no show for multiple scheduled visits, you may be dismissed from the clinic at the providers discretion.     Resources: [facility] Social Workers [phone] for additional information on assistance programs or assistance connecting with community support programs   [place] DSS  [phone]: Information regarding food stamps, Medicaid, and utility assistance [place] [employer] [place] [phone]    [employer]'s shared-ride transportation service for eligible riders who have a disability that prevents them from riding the fixed route bus.   Medicare  Rights Center 800-333-4114 Helps people with Medicare understand their rights and benefits, navigate the Medicare system, and secure the quality healthcare they deserve [employer] 800-227-2345 Assists patients locate various types of support and financial assistance Cancer Care: 1-800-813-HOPE (4673) Provides financial assistance, online support groups, medication/co-pay assistance.   Transportation Assistance for appointments at [facility]: Transportation Coordinator [phone]  Again, thank you for choosing  Cancer Center for your care.       

## 2020-03-04 DIAGNOSIS — Z6826 Body mass index (BMI) 26.0-26.9, adult: Secondary | ICD-10-CM | POA: Diagnosis not present

## 2020-03-04 DIAGNOSIS — M7061 Trochanteric bursitis, right hip: Secondary | ICD-10-CM | POA: Diagnosis not present

## 2020-03-04 DIAGNOSIS — M5416 Radiculopathy, lumbar region: Secondary | ICD-10-CM | POA: Diagnosis not present

## 2020-03-09 LAB — JAK2 (INCLUDING V617F AND EXON 12), MPL,& CALR-NEXT GEN SEQ

## 2020-03-10 DIAGNOSIS — M5416 Radiculopathy, lumbar region: Secondary | ICD-10-CM | POA: Diagnosis not present

## 2020-03-10 DIAGNOSIS — M48062 Spinal stenosis, lumbar region with neurogenic claudication: Secondary | ICD-10-CM | POA: Diagnosis not present

## 2020-03-22 ENCOUNTER — Telehealth: Payer: Self-pay | Admitting: Hematology

## 2020-03-22 NOTE — Telephone Encounter (Signed)
Scheduled per 12/14 los, patient has been called and notified of upcoming appointments.

## 2020-03-23 ENCOUNTER — Other Ambulatory Visit: Payer: Self-pay

## 2020-03-23 ENCOUNTER — Inpatient Hospital Stay: Payer: Medicare Other | Attending: Hematology | Admitting: Hematology

## 2020-03-23 DIAGNOSIS — D471 Chronic myeloproliferative disease: Secondary | ICD-10-CM

## 2020-03-23 DIAGNOSIS — D75839 Thrombocytosis, unspecified: Secondary | ICD-10-CM | POA: Insufficient documentation

## 2020-03-23 DIAGNOSIS — N289 Disorder of kidney and ureter, unspecified: Secondary | ICD-10-CM | POA: Insufficient documentation

## 2020-03-23 DIAGNOSIS — Z1589 Genetic susceptibility to other disease: Secondary | ICD-10-CM | POA: Diagnosis not present
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
## 2020-03-24 ENCOUNTER — Telehealth: Payer: Self-pay | Admitting: Hematology

## 2020-03-24 NOTE — Telephone Encounter (Signed)
Scheduled appointment per 1/4 los. Spoke to patient who is aware of appointment date and time. Scheduled patient closest to 2 weeks I could get while working for patient's schedule.

## 2020-04-01 DIAGNOSIS — M5416 Radiculopathy, lumbar region: Secondary | ICD-10-CM | POA: Diagnosis not present

## 2020-04-02 NOTE — Telephone Encounter (Signed)
error 

## 2020-04-05 ENCOUNTER — Other Ambulatory Visit: Payer: Self-pay | Admitting: Radiology

## 2020-04-05 ENCOUNTER — Telehealth: Payer: Self-pay | Admitting: Hematology

## 2020-04-05 ENCOUNTER — Telehealth: Payer: Self-pay | Admitting: *Deleted
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
## 2020-04-05 NOTE — Telephone Encounter (Signed)
Received a sch msg to make an appt for Anna [name] 4-5 following bx. Ms. Calixte has been cld and scheduled to see Dr. Irene Limbo on 2/14 at 220pm w/labs at 2pm. Letter mailed.

## 2020-04-06 ENCOUNTER — Ambulatory Visit (HOSPITAL_COMMUNITY): Payer: Medicare Other

## 2020-04-07 ENCOUNTER — Ambulatory Visit: Payer: Medicare Other

## 2020-04-13 ENCOUNTER — Other Ambulatory Visit: Payer: Medicare Other

## 2020-04-13 ENCOUNTER — Ambulatory Visit: Payer: Medicare Other | Admitting: Hematology

## 2020-04-22 ENCOUNTER — Other Ambulatory Visit: Payer: Self-pay | Admitting: Radiology

## 2020-04-26 ENCOUNTER — Ambulatory Visit (HOSPITAL_COMMUNITY)
Admission: RE | Admit: 2020-04-26 | Discharge: 2020-04-26 | Disposition: A | Discharge status: Home / Self Care | Payer: Medicare Other | Source: Ambulatory Visit | Attending: Hematology | Admitting: Hematology

## 2020-04-26 ENCOUNTER — Other Ambulatory Visit: Payer: Self-pay

## 2020-04-26 ENCOUNTER — Encounter (HOSPITAL_COMMUNITY): Payer: Self-pay

## 2020-04-26 DIAGNOSIS — Z7984 Long term (current) use of oral hypoglycemic drugs: Secondary | ICD-10-CM | POA: Insufficient documentation

## 2020-04-26 DIAGNOSIS — D471 Chronic myeloproliferative disease: Secondary | ICD-10-CM | POA: Diagnosis not present

## 2020-04-26 DIAGNOSIS — N189 Chronic kidney disease, unspecified: Secondary | ICD-10-CM | POA: Insufficient documentation

## 2020-04-26 DIAGNOSIS — R897 Abnormal histological findings in specimens from other organs, systems and tissues: Secondary | ICD-10-CM | POA: Diagnosis not present

## 2020-04-26 DIAGNOSIS — D75839 Thrombocytosis, unspecified: Secondary | ICD-10-CM | POA: Insufficient documentation

## 2020-04-26 DIAGNOSIS — Z1589 Genetic susceptibility to other disease: Secondary | ICD-10-CM | POA: Insufficient documentation

## 2020-04-26 DIAGNOSIS — E1122 Type 2 diabetes mellitus with diabetic chronic kidney disease: Secondary | ICD-10-CM | POA: Diagnosis not present

## 2020-04-26 DIAGNOSIS — I129 Hypertensive chronic kidney disease with stage 1 through stage 4 chronic kidney disease, or unspecified chronic kidney disease: Secondary | ICD-10-CM | POA: Insufficient documentation

## 2020-04-26 LAB — BASIC METABOLIC PANEL
Anion gap: 15 (ref 5–15)
BUN: 19 mg/dL (ref 8–23)
CO2: 19 mmol/L — ABNORMAL LOW (ref 22–32)
Calcium: 9.3 mg/dL (ref 8.9–10.3)
Chloride: 106 mmol/L (ref 98–111)
Creatinine, Ser: 1.35 mg/dL — ABNORMAL HIGH (ref 0.44–1.00)
GFR, Estimated: 42 mL/min — ABNORMAL LOW (ref >60)
Glucose, Bld: 183 mg/dL — ABNORMAL HIGH (ref 70–99)
Potassium: 3.8 mmol/L (ref 3.5–5.1)
Sodium: 140 mmol/L (ref 135–145)

## 2020-04-26 LAB — CBC WITH DIFFERENTIAL/PLATELET
Abs Immature Granulocytes: 0.09 10*3/uL — ABNORMAL HIGH (ref 0.00–0.07)
Basophils Absolute: 0.1 10*3/uL (ref 0.0–0.1)
Basophils Relative: 1 %
Eosinophils Absolute: 0.1 10*3/uL (ref 0.0–0.5)
Eosinophils Relative: 1 %
HCT: 39.3 % (ref 36.0–46.0)
Hemoglobin: 12.5 g/dL (ref 12.0–15.0)
Immature Granulocytes: 1 %
Lymphocytes Relative: 17 %
Lymphs Abs: 1.8 10*3/uL (ref 0.7–4.0)
MCH: 29.6 pg (ref 26.0–34.0)
MCHC: 31.8 g/dL (ref 30.0–36.0)
MCV: 92.9 fL (ref 80.0–100.0)
Monocytes Absolute: 0.7 10*3/uL (ref 0.1–1.0)
Monocytes Relative: 6 %
Neutro Abs: 7.5 10*3/uL (ref 1.7–7.7)
Neutrophils Relative %: 74 %
Platelets: 742 10*3/uL — ABNORMAL HIGH (ref 150–400)
RBC: 4.23 MIL/uL (ref 3.87–5.11)
RDW: 15.9 % — ABNORMAL HIGH (ref 11.5–15.5)
WBC: 10.3 10*3/uL (ref 4.0–10.5)
nRBC: 0 % (ref 0.0–0.2)

## 2020-04-26 LAB — GLUCOSE, CAPILLARY: Glucose-Capillary: 185 mg/dL — ABNORMAL HIGH (ref 70–99)

## 2020-04-26 LAB — PROTIME-INR
INR: 1 (ref 0.8–1.2)
Prothrombin Time: 13.1 seconds (ref 11.4–15.2)

## 2020-04-26 MED ORDER — FLUMAZENIL 0.5 MG/5ML IV SOLN
INTRAVENOUS | Status: AC
  Filled 2020-04-26: qty 5

## 2020-04-26 MED ORDER — LIDOCAINE HCL (PF) 1 % IJ SOLN
INTRAMUSCULAR | Status: AC | PRN
  Administered 2020-04-26: 10 mL via INTRADERMAL

## 2020-04-26 MED ORDER — SODIUM CHLORIDE 0.9 % IV SOLN: INTRAVENOUS | Status: DC

## 2020-04-26 MED ORDER — MIDAZOLAM HCL 2 MG/2ML IJ SOLN
INTRAMUSCULAR | Status: AC | PRN
  Administered 2020-04-26: 1 mg via INTRAVENOUS
  Administered 2020-04-26: 0.5 mg via INTRAVENOUS

## 2020-04-26 MED ORDER — FENTANYL CITRATE (PF) 100 MCG/2ML IJ SOLN
INTRAMUSCULAR | Status: AC
  Filled 2020-04-26: qty 2

## 2020-04-26 MED ORDER — NALOXONE HCL 0.4 MG/ML IJ SOLN
INTRAMUSCULAR | Status: AC
  Filled 2020-04-26: qty 1

## 2020-04-26 MED ORDER — FENTANYL CITRATE (PF) 100 MCG/2ML IJ SOLN
INTRAMUSCULAR | Status: AC | PRN
  Administered 2020-04-26: 25 ug via INTRAVENOUS
  Administered 2020-04-26: 50 ug via INTRAVENOUS

## 2020-04-26 MED ORDER — MIDAZOLAM HCL 2 MG/2ML IJ SOLN
INTRAMUSCULAR | Status: AC
  Filled 2020-04-26: qty 4

## 2020-04-26 NOTE — Discharge Instructions (Signed)
Please notify your PCP or Dr. Irene Limbo of recent falls.  Please call Interventional Radiology clinic [phone] with any questions or concerns.  You may remove your dressing and shower tomorrow.   Bone Marrow Aspiration and Bone Marrow Biopsy, Adult, Care After This sheet gives you information about how to care for yourself after your procedure. Your health care provider may also give you more specific instructions. If you have problems or questions, contact your health care provider. What can I expect after the procedure? After the procedure, it is common to have:  Mild pain and tenderness.  Swelling.  Bruising. Follow these instructions at home: Puncture site care  Follow instructions from your health care provider about how to take care of the puncture site. Make sure you: ? Wash your hands with soap and water before and after you change your bandage (dressing). If soap and water are not available, use hand sanitizer. ? Change your dressing as told by your health care provider.  Check your puncture site every day for signs of infection. Check for: ? More redness, swelling, or pain. ? Fluid or blood. ? Warmth. ? Pus or a bad smell.   Activity  Return to your normal activities as told by your health care provider. Ask your health care provider what activities are safe for you.  Do not lift anything that is heavier than 10 lb (4.5 kg), or the limit that you are told, until your health care provider says that it is safe.  Do not drive for 24 hours if you were given a sedative during your procedure. General instructions  Take over-the-counter and prescription medicines only as told by your health care provider.  Do not take baths, swim, or use a hot tub until your health care provider approves. Ask your health care provider if you may take showers. You may only be allowed to take sponge baths.  If directed, put ice on the affected area. To do this: ? Put ice in a plastic  bag. ? Place a towel between your skin and the bag. ? Leave the ice on for 20 minutes, 2-3 times a day.  Keep all follow-up visits as told by your health care provider. This is important.   Contact a health care provider if:  Your pain is not controlled with medicine.  You have a fever.  You have more redness, swelling, or pain around the puncture site.  You have fluid or blood coming from the puncture site.  Your puncture site feels warm to the touch.  You have pus or a bad smell coming from the puncture site. Summary  After the procedure, it is common to have mild pain, tenderness, swelling, and bruising.  Follow instructions from your health care provider about how to take care of the puncture site and what activities are safe for you.  Take over-the-counter and prescription medicines only as told by your health care provider.  Contact a health care provider if you have any signs of infection, such as fluid or blood coming from the puncture site. This information is not intended to replace advice given to you by your health care provider. Make sure you discuss any questions you have with your health care provider. Document Revised: 07/23/2018 Document Reviewed: 07/23/2018 Elsevier Patient Education  2021 [employer].   Moderate Conscious Sedation, Adult, Care After This sheet gives you information about how to care for yourself after your procedure. Your health care provider may also give you more specific instructions. If  you have problems or questions, contact your health care provider. What can I expect after the procedure? After the procedure, it is common to have:  Sleepiness for several hours.  Impaired judgment for several hours.  Difficulty with balance.  Vomiting if you eat too soon. Follow these instructions at home: For the time period you were told by your health care provider:  Rest.  Do not participate in activities where you could fall or become  injured.  Do not drive or use machinery.  Do not drink alcohol.  Do not take sleeping pills or medicines that cause drowsiness.  Do not make important decisions or sign legal documents.  Do not take care of children on your own.      Eating and drinking  Follow the diet recommended by your health care provider.  Drink enough fluid to keep your urine pale yellow.  If you vomit: ? Drink water, juice, or soup when you can drink without vomiting. ? Make sure you have little or no nausea before eating solid foods.   General instructions  Take over-the-counter and prescription medicines only as told by your health care provider.  Have a responsible adult stay with you for the time you are told. It is important to have someone help care for you until you are awake and alert.  Do not smoke.  Keep all follow-up visits as told by your health care provider. This is important. Contact a health care provider if:  You are still sleepy or having trouble with balance after 24 hours.  You feel light-headed.  You keep feeling nauseous or you keep vomiting.  You develop a rash.  You have a fever.  You have redness or swelling around the IV site. Get help right away if:  You have trouble breathing.  You have new-onset confusion at home. Summary  After the procedure, it is common to feel sleepy, have impaired judgment, or feel nauseous if you eat too soon.  Rest after you get home. Know the things you should not do after the procedure.  Follow the diet recommended by your health care provider and drink enough fluid to keep your urine pale yellow.  Get help right away if you have trouble breathing or new-onset confusion at home. This information is not intended to replace advice given to you by your health care provider. Make sure you discuss any questions you have with your health care provider. Document Revised: 07/04/2019 Document Reviewed: 01/30/2019 Elsevier Patient  Education  2021 [employer].

## 2020-04-26 NOTE — Procedures (Signed)
Interventional Radiology Procedure Note  Procedure: CT BM ASP AND CORE BX    Complications: None  Estimated Blood Loss:  MIN  Findings: 11 G CORE AND ASP    M. TREVOR Dmarcus Decicco, MD    
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
## 2020-04-26 NOTE — Progress Notes (Signed)
Patient states she has fallen twice in the past week at home.  Mrs. Balsam reports loss of consciousness, hitting the back of her head and low back/buttocks soreness.  Patient reports she feels as though she is going to fall backwards.  Patient did not seek medical attention.   Rowe Robert, PA and Dr. Annamaria Boots notified.

## 2020-04-28 LAB — SURGICAL PATHOLOGY
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
## 2020-05-03 ENCOUNTER — Telehealth: Payer: Self-pay | Admitting: Pharmacist

## 2020-05-03 ENCOUNTER — Other Ambulatory Visit: Payer: Self-pay | Admitting: Hematology

## 2020-05-03 ENCOUNTER — Telehealth: Payer: Self-pay

## 2020-05-03 ENCOUNTER — Other Ambulatory Visit: Payer: Self-pay

## 2020-05-03 ENCOUNTER — Inpatient Hospital Stay: Payer: Medicare Other | Attending: Hematology | Admitting: Hematology

## 2020-05-03 ENCOUNTER — Inpatient Hospital Stay: Payer: Medicare Other

## 2020-05-03 DIAGNOSIS — F17210 Nicotine dependence, cigarettes, uncomplicated: Secondary | ICD-10-CM | POA: Insufficient documentation

## 2020-05-03 DIAGNOSIS — Z79899 Other long term (current) drug therapy: Secondary | ICD-10-CM | POA: Diagnosis not present

## 2020-05-03 DIAGNOSIS — D75839 Thrombocytosis, unspecified: Secondary | ICD-10-CM | POA: Diagnosis not present

## 2020-05-03 DIAGNOSIS — Z806 Family history of leukemia: Secondary | ICD-10-CM | POA: Insufficient documentation

## 2020-05-03 DIAGNOSIS — E119 Type 2 diabetes mellitus without complications: Secondary | ICD-10-CM | POA: Diagnosis not present

## 2020-05-03 DIAGNOSIS — D473 Essential (hemorrhagic) thrombocythemia: Secondary | ICD-10-CM | POA: Diagnosis not present

## 2020-05-03 DIAGNOSIS — Z809 Family history of malignant neoplasm, unspecified: Secondary | ICD-10-CM | POA: Insufficient documentation

## 2020-05-03 DIAGNOSIS — Z833 Family history of diabetes mellitus: Secondary | ICD-10-CM | POA: Insufficient documentation

## 2020-05-03 DIAGNOSIS — Z7982 Long term (current) use of aspirin: Secondary | ICD-10-CM | POA: Diagnosis not present

## 2020-05-03 DIAGNOSIS — I10 Essential (primary) hypertension: Secondary | ICD-10-CM | POA: Diagnosis not present

## 2020-05-03 DIAGNOSIS — Z8249 Family history of ischemic heart disease and other diseases of the circulatory system: Secondary | ICD-10-CM | POA: Diagnosis not present

## 2020-05-03 MED ORDER — HYDROXYUREA 500 MG PO CAPS: 500.0000 mg | ORAL_CAPSULE | Freq: Every day | ORAL | 1 refills | Status: DC

## 2020-05-03 MED FILL — HYDROXYUREA 500 MG CAPSULE: 500 | 30 days supply | Qty: 30 | Fill #0

## 2020-05-03 NOTE — Telephone Encounter (Signed)
Received call from patient that she would not be able to make her appointment to see Dr. Irene Limbo at 2 pm today because she is sick and has been coughing. She requested to have Dr. Irene Limbo give her a call instead.   Message routed to Dr. Irene Limbo.

## 2020-05-03 NOTE — Telephone Encounter (Signed)
Oral Oncology Pharmacist Encounter  Received new prescription for Hydrea (hydroxyurea) for the treatment of essential thrombocythemia, JAK2+ for goal platelet count < 400 K/uL.  Prescription dose and frequency assessed for appropriateness. Appropriate for therapy initiation. Dose will be titrated based on patient's pltc response.   BMP and CBC w/ Diff from 04/26/20 assessed, noted pltc of 742 K/uL. Pt with Scr of 1.35 mg/dL (CrCl ~42 mL/min), no dose adjustments required.   Current medication list in Epic reviewed, no relevant/significant DDIs with Hydrea identified.  Evaluated chart and no patient barriers to medication adherence noted.   Prescription has been e-scribed to the [facility] for benefits analysis and approval.  Oral Oncology Clinic will continue to follow for insurance authorization, copayment issues, initial counseling and start date.  Leron Croak, PharmD, BCPS Hematology/Oncology Clinical Pharmacist [facility] [phone] 05/03/2020 3:16 PM

## 2020-05-03 NOTE — Telephone Encounter (Signed)
Patient's appt. today changed to a phone visit at 2 pm. Patient made aware of this change.

## 2020-05-03 NOTE — Telephone Encounter (Signed)
Oral Oncology Patient Advocate Encounter  After completing a benefits investigation, prior authorization for Hydroxyurea is not required at this time through [facility].  Patient's copay is $6.     [name] Patient [name] Phone [phone] Fax [phone] 05/03/2020 3:12 PM

## 2020-05-04 ENCOUNTER — Telehealth: Payer: Self-pay | Admitting: Hematology

## 2020-05-04 ENCOUNTER — Encounter (HOSPITAL_COMMUNITY): Payer: Self-pay | Admitting: Hematology

## 2020-05-04 DIAGNOSIS — M961 Postlaminectomy syndrome, not elsewhere classified: Secondary | ICD-10-CM | POA: Insufficient documentation

## 2020-05-04 DIAGNOSIS — M5416 Radiculopathy, lumbar region: Secondary | ICD-10-CM | POA: Diagnosis not present

## 2020-05-04 DIAGNOSIS — M48062 Spinal stenosis, lumbar region with neurogenic claudication: Secondary | ICD-10-CM | POA: Diagnosis not present

## 2020-05-04 NOTE — Telephone Encounter (Signed)
Oral Chemotherapy Pharmacist Encounter  Patient Education I spoke with patient for overview of new oral chemotherapy medication: Hydrea (hydroxyurea) for the treatment of essential thrombocythemia, JAK2+, for goal platelet count < 400 K/uL.   Pt is doing well. Counseled patient on administration, dosing, side effects, monitoring, drug-food interactions, safe handling, storage, and disposal.  Patient will take Hydroxyurea 500 mg capsule, 1 capsule (500 mg total) by mouth daily. May take with food to minimize GI side effects  Medication start date: 05/04/20   Side effects include but not limited to: decreased blood counts, headache, rash.   Reviewed with patient importance of keeping a medication schedule and plan for any missed doses.  After discussion with patient no patient barriers to medication adherence identified.   Patient will pick up medication from the [facility] on 05/04/20. Patient has $6 copay for first fill of Hydrea.   Anna [name] voiced understanding and appreciation. All questions answered. Medication handout provided.  Provided patient with [facility] phone number. Patient knows to call the office with questions or concerns. Oral Chemotherapy Navigation Clinic will continue to follow.  Leron Croak, PharmD, BCPS Hematology/Oncology Clinical Pharmacist [facility] [phone] 05/04/2020 9:35 AM

## 2020-05-04 NOTE — Telephone Encounter (Signed)
Scheduled per 02/14 los, patient has been called and notified. 

## 2020-05-07 DIAGNOSIS — F17210 Nicotine dependence, cigarettes, uncomplicated: Secondary | ICD-10-CM | POA: Diagnosis not present

## 2020-05-07 DIAGNOSIS — E785 Hyperlipidemia, unspecified: Secondary | ICD-10-CM | POA: Diagnosis not present

## 2020-05-07 DIAGNOSIS — I10 Essential (primary) hypertension: Secondary | ICD-10-CM | POA: Diagnosis not present

## 2020-05-07 DIAGNOSIS — E1151 Type 2 diabetes mellitus with diabetic peripheral angiopathy without gangrene: Secondary | ICD-10-CM | POA: Diagnosis not present

## 2020-05-17 DIAGNOSIS — M5416 Radiculopathy, lumbar region: Secondary | ICD-10-CM | POA: Diagnosis not present

## 2020-05-18 ENCOUNTER — Other Ambulatory Visit: Payer: Self-pay

## 2020-05-18 DIAGNOSIS — D473 Essential (hemorrhagic) thrombocythemia: Secondary | ICD-10-CM
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
## 2020-05-20 ENCOUNTER — Inpatient Hospital Stay: Payer: Medicare Other | Attending: Hematology

## 2020-05-20 ENCOUNTER — Inpatient Hospital Stay: Payer: Medicare Other | Admitting: Hematology

## 2020-05-20 ENCOUNTER — Other Ambulatory Visit: Payer: Self-pay

## 2020-05-20 VITALS — BP 134/70 | HR 116 | Temp 98.4°F | Resp 20 | Ht 64.0 in

## 2020-05-20 DIAGNOSIS — Z1589 Genetic susceptibility to other disease: Secondary | ICD-10-CM | POA: Diagnosis not present

## 2020-05-20 DIAGNOSIS — D75839 Thrombocytosis, unspecified: Secondary | ICD-10-CM | POA: Insufficient documentation

## 2020-05-20 DIAGNOSIS — D473 Essential (hemorrhagic) thrombocythemia: Secondary | ICD-10-CM

## 2020-05-20 LAB — CMP (CANCER CENTER ONLY)
ALT: 19 U/L (ref 0–44)
AST: 15 U/L (ref 15–41)
Albumin: 3.9 g/dL (ref 3.5–5.0)
Alkaline Phosphatase: 152 U/L — ABNORMAL HIGH (ref 38–126)
Anion gap: 10 (ref 5–15)
BUN: 40 mg/dL — ABNORMAL HIGH (ref 8–23)
CO2: 20 mmol/L — ABNORMAL LOW (ref 22–32)
Calcium: 9.9 mg/dL (ref 8.9–10.3)
Chloride: 108 mmol/L (ref 98–111)
Creatinine: 1.27 mg/dL — ABNORMAL HIGH (ref 0.44–1.00)
GFR, Estimated: 45 mL/min — ABNORMAL LOW (ref >60)
Glucose, Bld: 185 mg/dL — ABNORMAL HIGH (ref 70–99)
Potassium: 4.7 mmol/L (ref 3.5–5.1)
Sodium: 138 mmol/L (ref 135–145)
Total Bilirubin: 0.2 mg/dL — ABNORMAL LOW (ref 0.3–1.2)
Total Protein: 7.5 g/dL (ref 6.5–8.1)

## 2020-05-20 LAB — CBC WITH DIFFERENTIAL (CANCER CENTER ONLY)
Abs Immature Granulocytes: 0.06 10*3/uL (ref 0.00–0.07)
Basophils Absolute: 0.1 10*3/uL (ref 0.0–0.1)
Basophils Relative: 1 %
Eosinophils Absolute: 0 10*3/uL (ref 0.0–0.5)
Eosinophils Relative: 0 %
HCT: 37 % (ref 36.0–46.0)
Hemoglobin: 11.9 g/dL — ABNORMAL LOW (ref 12.0–15.0)
Immature Granulocytes: 1 %
Lymphocytes Relative: 20 %
Lymphs Abs: 2.5 10*3/uL (ref 0.7–4.0)
MCH: 29.9 pg (ref 26.0–34.0)
MCHC: 32.2 g/dL (ref 30.0–36.0)
MCV: 93 fL (ref 80.0–100.0)
Monocytes Absolute: 0.7 10*3/uL (ref 0.1–1.0)
Monocytes Relative: 6 %
Neutro Abs: 8.8 10*3/uL — ABNORMAL HIGH (ref 1.7–7.7)
Neutrophils Relative %: 72 %
Platelet Count: 567 10*3/uL — ABNORMAL HIGH (ref 150–400)
RBC: 3.98 MIL/uL (ref 3.87–5.11)
RDW: 16.3 % — ABNORMAL HIGH (ref 11.5–15.5)
WBC Count: 12.2 10*3/uL — ABNORMAL HIGH (ref 4.0–10.5)
nRBC: 0 % (ref 0.0–0.2)

## 2020-05-20 MED ORDER — HYDROXYUREA 500 MG PO CAPS: 500.0000 mg | ORAL_CAPSULE | Freq: Every day | ORAL | 2 refills | Status: DC

## 2020-06-08 ENCOUNTER — Other Ambulatory Visit: Payer: Self-pay | Admitting: Neurological Surgery

## 2020-06-08 DIAGNOSIS — M5416 Radiculopathy, lumbar region: Secondary | ICD-10-CM

## 2020-06-09 ENCOUNTER — Telehealth: Payer: Self-pay

## 2020-06-09 DIAGNOSIS — N179 Acute kidney failure, unspecified: Secondary | ICD-10-CM | POA: Insufficient documentation

## 2020-06-09 DIAGNOSIS — K2900 Acute gastritis without bleeding: Secondary | ICD-10-CM | POA: Insufficient documentation

## 2020-06-09 DIAGNOSIS — E872 Acidosis, unspecified: Secondary | ICD-10-CM | POA: Insufficient documentation

## 2020-06-09 DIAGNOSIS — D61818 Other pancytopenia: Secondary | ICD-10-CM | POA: Insufficient documentation

## 2020-06-09 DIAGNOSIS — G939 Disorder of brain, unspecified: Secondary | ICD-10-CM | POA: Insufficient documentation

## 2020-06-09 DIAGNOSIS — E876 Hypokalemia: Secondary | ICD-10-CM | POA: Insufficient documentation

## 2020-06-09 NOTE — Telephone Encounter (Signed)
Phone call to patient to verify medication list and allergies for myelogram procedure. Pt instructed to hold Nortriptyline for 48hrs prior to myelogram appointment time and 24 hours after appointment. Pt also instructed to have a driver the day of the procedure, the procedure would take around 2 hours, and discharge instructions discussed. Pt verbalized understanding.

## 2020-06-14 ENCOUNTER — Ambulatory Visit
Admission: RE | Admit: 2020-06-14 | Discharge: 2020-06-14 | Disposition: A | Discharge status: Home / Self Care | Payer: Medicare Other | Source: Ambulatory Visit | Attending: Neurological Surgery | Admitting: Neurological Surgery

## 2020-06-14 ENCOUNTER — Other Ambulatory Visit: Payer: Self-pay

## 2020-06-14 DIAGNOSIS — M5416 Radiculopathy, lumbar region: Secondary | ICD-10-CM

## 2020-06-14 DIAGNOSIS — M4856XA Collapsed vertebra, not elsewhere classified, lumbar region, initial encounter for fracture: Secondary | ICD-10-CM | POA: Diagnosis not present

## 2020-06-14 DIAGNOSIS — M48061 Spinal stenosis, lumbar region without neurogenic claudication: Secondary | ICD-10-CM | POA: Diagnosis not present

## 2020-06-14 MED ORDER — IOPAMIDOL (ISOVUE-M 200) INJECTION 41%
18.0000 mL | Freq: Once | INTRAMUSCULAR | Status: AC
  Administered 2020-06-14: 18 mL via INTRATHECAL

## 2020-06-14 MED ORDER — DIAZEPAM 5 MG PO TABS: 5.0000 mg | ORAL_TABLET | Freq: Once | ORAL | Status: DC

## 2020-06-14 NOTE — Discharge Instructions (Signed)
Myelogram Discharge Instructions  1. Go home and rest quietly for the next 24 hours.  It is important to lie flat for the next 24 hours.  Get up only to go to the restroom.  You may lie in the bed or on a couch on your back, your stomach, your left side or your right side.  You may have one pillow under your head.  You may have pillows between your knees while you are on your side or under your knees while you are on your back.  2. DO NOT drive today.  Recline the seat as far back as it will go, while still wearing your seat belt, on the way home.  3. You may get up to go to the bathroom as needed.  You may sit up for 10 minutes to eat.  You may resume your normal diet and medications unless otherwise indicated.  Drink lots of extra fluids today and tomorrow.  4. The incidence of headache, nausea, or vomiting is about 5% (one in 20 patients).  If you develop a headache, lie flat and drink plenty of fluids until the headache goes away.  Caffeinated beverages may be helpful.  If you develop severe nausea and vomiting or a headache that does not go away with flat bed rest, call [phone].  5. You may resume normal activities after your 24 hours of bed rest is over; however, do not exert yourself strongly or do any heavy lifting tomorrow. If when you get up you have a headache when standing, go back to bed and force fluids for another 24 hours.  6. Call your physician for a follow-up appointment.  The results of your myelogram will be sent directly to your physician by the following day.  7. If you have any questions or if complications develop after you arrive home, please call [phone].  Discharge instructions have been explained to the patient.  The patient, or the person responsible for the patient, fully understands these instructions  YOU MAY TAKE YOUR NORTRIPTYLINE TOMORROW ON 06/15/20 @ 10AM.

## 2020-06-22 DIAGNOSIS — M5416 Radiculopathy, lumbar region: Secondary | ICD-10-CM | POA: Diagnosis not present
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
## 2020-06-23 ENCOUNTER — Inpatient Hospital Stay: Payer: Medicare Other | Admitting: Hematology

## 2020-06-23 ENCOUNTER — Inpatient Hospital Stay: Payer: Medicare Other | Attending: Hematology

## 2020-06-24 DIAGNOSIS — M5416 Radiculopathy, lumbar region: Secondary | ICD-10-CM | POA: Diagnosis not present

## 2020-06-28 ENCOUNTER — Emergency Department (HOSPITAL_COMMUNITY): Payer: Medicare Other

## 2020-06-28 ENCOUNTER — Inpatient Hospital Stay (HOSPITAL_COMMUNITY): Payer: Medicare Other

## 2020-06-28 ENCOUNTER — Encounter (HOSPITAL_COMMUNITY): Payer: Self-pay

## 2020-06-28 ENCOUNTER — Inpatient Hospital Stay (HOSPITAL_COMMUNITY)
Admission: EM | Admit: 2020-06-28 | Discharge: 2020-07-06 | DRG: 871 | Disposition: A | Discharge status: Home / Self Care | Payer: Medicare Other | Attending: Internal Medicine | Admitting: Internal Medicine

## 2020-06-28 DIAGNOSIS — Z79899 Other long term (current) drug therapy: Secondary | ICD-10-CM

## 2020-06-28 DIAGNOSIS — R000 Tachycardia, unspecified: Secondary | ICD-10-CM | POA: Diagnosis present

## 2020-06-28 DIAGNOSIS — I420 Dilated cardiomyopathy: Secondary | ICD-10-CM | POA: Diagnosis present

## 2020-06-28 DIAGNOSIS — I5031 Acute diastolic (congestive) heart failure: Secondary | ICD-10-CM

## 2020-06-28 DIAGNOSIS — I130 Hypertensive heart and chronic kidney disease with heart failure and stage 1 through stage 4 chronic kidney disease, or unspecified chronic kidney disease: Secondary | ICD-10-CM | POA: Diagnosis present

## 2020-06-28 DIAGNOSIS — N179 Acute kidney failure, unspecified: Secondary | ICD-10-CM | POA: Diagnosis present

## 2020-06-28 DIAGNOSIS — F17210 Nicotine dependence, cigarettes, uncomplicated: Secondary | ICD-10-CM | POA: Diagnosis present

## 2020-06-28 DIAGNOSIS — Z981 Arthrodesis status: Secondary | ICD-10-CM

## 2020-06-28 DIAGNOSIS — E1122 Type 2 diabetes mellitus with diabetic chronic kidney disease: Secondary | ICD-10-CM | POA: Diagnosis present

## 2020-06-28 DIAGNOSIS — D473 Essential (hemorrhagic) thrombocythemia: Secondary | ICD-10-CM | POA: Diagnosis present

## 2020-06-28 DIAGNOSIS — G4700 Insomnia, unspecified: Secondary | ICD-10-CM | POA: Diagnosis present

## 2020-06-28 DIAGNOSIS — J90 Pleural effusion, not elsewhere classified: Secondary | ICD-10-CM | POA: Diagnosis not present

## 2020-06-28 DIAGNOSIS — R451 Restlessness and agitation: Secondary | ICD-10-CM | POA: Diagnosis present

## 2020-06-28 DIAGNOSIS — Z7982 Long term (current) use of aspirin: Secondary | ICD-10-CM

## 2020-06-28 DIAGNOSIS — I2729 Other secondary pulmonary hypertension: Secondary | ICD-10-CM | POA: Diagnosis present

## 2020-06-28 DIAGNOSIS — I10 Essential (primary) hypertension: Secondary | ICD-10-CM | POA: Diagnosis not present

## 2020-06-28 DIAGNOSIS — R0602 Shortness of breath: Secondary | ICD-10-CM | POA: Diagnosis not present

## 2020-06-28 DIAGNOSIS — I313 Pericardial effusion (noninflammatory): Secondary | ICD-10-CM | POA: Diagnosis not present

## 2020-06-28 DIAGNOSIS — I2510 Atherosclerotic heart disease of native coronary artery without angina pectoris: Secondary | ICD-10-CM | POA: Diagnosis present

## 2020-06-28 DIAGNOSIS — I5023 Acute on chronic systolic (congestive) heart failure: Secondary | ICD-10-CM | POA: Diagnosis not present

## 2020-06-28 DIAGNOSIS — D75839 Thrombocytosis, unspecified: Secondary | ICD-10-CM | POA: Diagnosis present

## 2020-06-28 DIAGNOSIS — I5041 Acute combined systolic (congestive) and diastolic (congestive) heart failure: Secondary | ICD-10-CM

## 2020-06-28 DIAGNOSIS — M549 Dorsalgia, unspecified: Secondary | ICD-10-CM | POA: Diagnosis not present

## 2020-06-28 DIAGNOSIS — R011 Cardiac murmur, unspecified: Secondary | ICD-10-CM | POA: Diagnosis not present

## 2020-06-28 DIAGNOSIS — G2581 Restless legs syndrome: Secondary | ICD-10-CM | POA: Diagnosis present

## 2020-06-28 DIAGNOSIS — E876 Hypokalemia: Secondary | ICD-10-CM | POA: Diagnosis not present

## 2020-06-28 DIAGNOSIS — R531 Weakness: Secondary | ICD-10-CM

## 2020-06-28 DIAGNOSIS — E1165 Type 2 diabetes mellitus with hyperglycemia: Secondary | ICD-10-CM | POA: Diagnosis present

## 2020-06-28 DIAGNOSIS — A419 Sepsis, unspecified organism: Secondary | ICD-10-CM | POA: Diagnosis not present

## 2020-06-28 DIAGNOSIS — J9811 Atelectasis: Secondary | ICD-10-CM | POA: Diagnosis not present

## 2020-06-28 DIAGNOSIS — Z7984 Long term (current) use of oral hypoglycemic drugs: Secondary | ICD-10-CM

## 2020-06-28 DIAGNOSIS — J9601 Acute respiratory failure with hypoxia: Secondary | ICD-10-CM | POA: Diagnosis present

## 2020-06-28 DIAGNOSIS — R6520 Severe sepsis without septic shock: Secondary | ICD-10-CM | POA: Diagnosis present

## 2020-06-28 DIAGNOSIS — Z20822 Contact with and (suspected) exposure to covid-19: Secondary | ICD-10-CM | POA: Diagnosis not present

## 2020-06-28 DIAGNOSIS — R509 Fever, unspecified: Secondary | ICD-10-CM | POA: Diagnosis not present

## 2020-06-28 DIAGNOSIS — D649 Anemia, unspecified: Secondary | ICD-10-CM | POA: Diagnosis present

## 2020-06-28 DIAGNOSIS — E785 Hyperlipidemia, unspecified: Secondary | ICD-10-CM | POA: Diagnosis present

## 2020-06-28 DIAGNOSIS — N1831 Chronic kidney disease, stage 3a: Secondary | ICD-10-CM | POA: Diagnosis present

## 2020-06-28 DIAGNOSIS — R0902 Hypoxemia: Secondary | ICD-10-CM | POA: Diagnosis not present

## 2020-06-28 DIAGNOSIS — E1142 Type 2 diabetes mellitus with diabetic polyneuropathy: Secondary | ICD-10-CM | POA: Diagnosis present

## 2020-06-28 DIAGNOSIS — R7989 Other specified abnormal findings of blood chemistry: Secondary | ICD-10-CM

## 2020-06-28 DIAGNOSIS — R0600 Dyspnea, unspecified: Secondary | ICD-10-CM

## 2020-06-28 DIAGNOSIS — J9809 Other diseases of bronchus, not elsewhere classified: Secondary | ICD-10-CM | POA: Diagnosis not present

## 2020-06-28 DIAGNOSIS — I214 Non-ST elevation (NSTEMI) myocardial infarction: Secondary | ICD-10-CM | POA: Diagnosis not present

## 2020-06-28 DIAGNOSIS — Z833 Family history of diabetes mellitus: Secondary | ICD-10-CM

## 2020-06-28 DIAGNOSIS — N390 Urinary tract infection, site not specified: Secondary | ICD-10-CM | POA: Diagnosis present

## 2020-06-28 DIAGNOSIS — Z87440 Personal history of urinary (tract) infections: Secondary | ICD-10-CM

## 2020-06-28 DIAGNOSIS — M797 Fibromyalgia: Secondary | ICD-10-CM | POA: Diagnosis present

## 2020-06-28 DIAGNOSIS — N879 Dysplasia of cervix uteri, unspecified: Secondary | ICD-10-CM | POA: Diagnosis present

## 2020-06-28 DIAGNOSIS — Z8249 Family history of ischemic heart disease and other diseases of the circulatory system: Secondary | ICD-10-CM

## 2020-06-28 DIAGNOSIS — R778 Other specified abnormalities of plasma proteins: Secondary | ICD-10-CM

## 2020-06-28 LAB — URINALYSIS, ROUTINE W REFLEX MICROSCOPIC
Bilirubin Urine: NEGATIVE
Glucose, UA: 50 mg/dL — AB
Hgb urine dipstick: NEGATIVE
Ketones, ur: NEGATIVE mg/dL
Nitrite: POSITIVE — AB
Protein, ur: 30 mg/dL — AB
Specific Gravity, Urine: 1.04 — ABNORMAL HIGH (ref 1.005–1.030)
pH: 5 (ref 5.0–8.0)

## 2020-06-28 LAB — CBC WITH DIFFERENTIAL/PLATELET
Abs Immature Granulocytes: 0.2 10*3/uL — ABNORMAL HIGH (ref 0.00–0.07)
Basophils Absolute: 0.1 10*3/uL (ref 0.0–0.1)
Basophils Relative: 0 %
Eosinophils Absolute: 0 10*3/uL (ref 0.0–0.5)
Eosinophils Relative: 0 %
HCT: 32.5 % — ABNORMAL LOW (ref 36.0–46.0)
Hemoglobin: 10.5 g/dL — ABNORMAL LOW (ref 12.0–15.0)
Immature Granulocytes: 1 %
Lymphocytes Relative: 4 %
Lymphs Abs: 0.7 10*3/uL (ref 0.7–4.0)
MCH: 32.7 pg (ref 26.0–34.0)
MCHC: 32.3 g/dL (ref 30.0–36.0)
MCV: 101.2 fL — ABNORMAL HIGH (ref 80.0–100.0)
Monocytes Absolute: 0.6 10*3/uL (ref 0.1–1.0)
Monocytes Relative: 4 %
Neutro Abs: 13.6 10*3/uL — ABNORMAL HIGH (ref 1.7–7.7)
Neutrophils Relative %: 91 %
Platelets: 274 10*3/uL (ref 150–400)
RBC: 3.21 MIL/uL — ABNORMAL LOW (ref 3.87–5.11)
RDW: 18.2 % — ABNORMAL HIGH (ref 11.5–15.5)
WBC: 15.1 10*3/uL — ABNORMAL HIGH (ref 4.0–10.5)
nRBC: 0 % (ref 0.0–0.2)

## 2020-06-28 LAB — COMPREHENSIVE METABOLIC PANEL
ALT: 13 U/L (ref 0–44)
AST: 18 U/L (ref 15–41)
Albumin: 2.8 g/dL — ABNORMAL LOW (ref 3.5–5.0)
Alkaline Phosphatase: 78 U/L (ref 38–126)
Anion gap: 10 (ref 5–15)
BUN: 24 mg/dL — ABNORMAL HIGH (ref 8–23)
CO2: 19 mmol/L — ABNORMAL LOW (ref 22–32)
Calcium: 8.6 mg/dL — ABNORMAL LOW (ref 8.9–10.3)
Chloride: 105 mmol/L (ref 98–111)
Creatinine, Ser: 1.2 mg/dL — ABNORMAL HIGH (ref 0.44–1.00)
GFR, Estimated: 48 mL/min — ABNORMAL LOW (ref >60)
Glucose, Bld: 310 mg/dL — ABNORMAL HIGH (ref 70–99)
Potassium: 4.1 mmol/L (ref 3.5–5.1)
Sodium: 134 mmol/L — ABNORMAL LOW (ref 135–145)
Total Bilirubin: 1.1 mg/dL (ref 0.3–1.2)
Total Protein: 6.1 g/dL — ABNORMAL LOW (ref 6.5–8.1)

## 2020-06-28 LAB — PROTIME-INR
INR: 1.1 (ref 0.8–1.2)
Prothrombin Time: 13.6 seconds (ref 11.4–15.2)

## 2020-06-28 LAB — RESP PANEL BY RT-PCR (FLU A&B, COVID) ARPGX2
Influenza A by PCR: NEGATIVE
Influenza B by PCR: NEGATIVE
SARS Coronavirus 2 by RT PCR: NEGATIVE

## 2020-06-28 LAB — APTT: aPTT: 32 seconds (ref 24–36)

## 2020-06-28 LAB — LACTIC ACID, PLASMA
Lactic Acid, Venous: 3.3 mmol/L (ref 0.5–1.9)
Lactic Acid, Venous: 2.5 mmol/L (ref 0.5–1.9)

## 2020-06-28 LAB — CBG MONITORING, ED: Glucose-Capillary: 199 mg/dL — ABNORMAL HIGH (ref 70–99)

## 2020-06-28 MED ORDER — ACETAMINOPHEN 650 MG RE SUPP
650.0000 mg | Freq: Once | RECTAL | Status: AC
  Administered 2020-06-28: 650 mg via RECTAL

## 2020-06-28 MED ORDER — SODIUM CHLORIDE 0.9 % IV SOLN
2.0000 g | Freq: Two times a day (BID) | INTRAVENOUS | Status: AC
  Administered 2020-06-29 – 2020-07-02 (×8): 2 g via INTRAVENOUS
  Filled 2020-06-28 (×8): qty 2

## 2020-06-28 MED ORDER — MELATONIN 3 MG PO TABS
3.0000 mg | ORAL_TABLET | Freq: Every evening | ORAL | Status: DC | PRN
  Administered 2020-07-03 – 2020-07-05 (×3): 3 mg via ORAL
  Filled 2020-06-28 (×5): qty 1

## 2020-06-28 MED ORDER — ATORVASTATIN CALCIUM 40 MG PO TABS
40.0000 mg | ORAL_TABLET | Freq: Every day | ORAL | Status: DC
  Administered 2020-06-29 – 2020-07-06 (×8): 40 mg via ORAL
  Filled 2020-06-28 (×8): qty 1

## 2020-06-28 MED ORDER — ENOXAPARIN SODIUM 40 MG/0.4ML ~~LOC~~ SOLN: 40.0000 mg | SUBCUTANEOUS | Status: DC

## 2020-06-28 MED ORDER — METRONIDAZOLE IN NACL 5-0.79 MG/ML-% IV SOLN
500.0000 mg | Freq: Once | INTRAVENOUS | Status: AC
  Administered 2020-06-28: 500 mg via INTRAVENOUS
  Filled 2020-06-28: qty 100

## 2020-06-28 MED ORDER — LEVALBUTEROL HCL 0.63 MG/3ML IN NEBU
0.6300 mg | INHALATION_SOLUTION | Freq: Once | RESPIRATORY_TRACT | Status: AC
  Administered 2020-06-28: 0.63 mg via RESPIRATORY_TRACT
  Filled 2020-06-28: qty 3

## 2020-06-28 MED ORDER — FUROSEMIDE 10 MG/ML IJ SOLN
40.0000 mg | Freq: Once | INTRAMUSCULAR | Status: AC
  Administered 2020-06-28: 40 mg via INTRAVENOUS
  Filled 2020-06-28: qty 4

## 2020-06-28 MED ORDER — ASPIRIN EC 81 MG PO TBEC
81.0000 mg | DELAYED_RELEASE_TABLET | Freq: Every day | ORAL | Status: DC
  Administered 2020-06-29 – 2020-07-04 (×6): 81 mg via ORAL
  Filled 2020-06-28 (×7): qty 1

## 2020-06-28 MED ORDER — LACTATED RINGERS IV BOLUS (SEPSIS)
1000.0000 mL | Freq: Once | INTRAVENOUS | Status: AC
  Administered 2020-06-28: 1000 mL via INTRAVENOUS

## 2020-06-28 MED ORDER — IPRATROPIUM BROMIDE 0.02 % IN SOLN
0.5000 mg | Freq: Once | RESPIRATORY_TRACT | Status: AC
  Administered 2020-06-28: 0.5 mg via RESPIRATORY_TRACT
  Filled 2020-06-28: qty 2.5

## 2020-06-28 MED ORDER — LACTATED RINGERS IV SOLN: INTRAVENOUS | Status: DC

## 2020-06-28 MED ORDER — INSULIN ASPART 100 UNIT/ML ~~LOC~~ SOLN
0.0000 [IU] | Freq: Three times a day (TID) | SUBCUTANEOUS | Status: DC
  Administered 2020-06-29 – 2020-06-30 (×4): 2 [IU] via SUBCUTANEOUS
  Administered 2020-06-30: 3 [IU] via SUBCUTANEOUS
  Administered 2020-07-01: 1 [IU] via SUBCUTANEOUS
  Administered 2020-07-01 (×2): 2 [IU] via SUBCUTANEOUS
  Administered 2020-07-02: 7 [IU] via SUBCUTANEOUS
  Administered 2020-07-02: 2 [IU] via SUBCUTANEOUS
  Administered 2020-07-02 – 2020-07-03 (×2): 3 [IU] via SUBCUTANEOUS
  Administered 2020-07-03: 7 [IU] via SUBCUTANEOUS
  Administered 2020-07-03: 3 [IU] via SUBCUTANEOUS
  Administered 2020-07-04: 2 [IU] via SUBCUTANEOUS
  Administered 2020-07-04: 3 [IU] via SUBCUTANEOUS
  Administered 2020-07-04 – 2020-07-05 (×2): 2 [IU] via SUBCUTANEOUS
  Administered 2020-07-05 – 2020-07-06 (×3): 3 [IU] via SUBCUTANEOUS

## 2020-06-28 MED ORDER — LACTATED RINGERS IV BOLUS (SEPSIS)
250.0000 mL | Freq: Once | INTRAVENOUS | Status: AC
  Administered 2020-06-28: 250 mL via INTRAVENOUS

## 2020-06-28 MED ORDER — VANCOMYCIN HCL 1500 MG/300ML IV SOLN
1500.0000 mg | Freq: Once | INTRAVENOUS | Status: AC
  Administered 2020-06-28: 1500 mg via INTRAVENOUS
  Filled 2020-06-28: qty 300

## 2020-06-28 MED ORDER — SODIUM CHLORIDE 0.9 % IV SOLN
2.0000 g | Freq: Once | INTRAVENOUS | Status: AC
  Administered 2020-06-28: 2 g via INTRAVENOUS
  Filled 2020-06-28: qty 2

## 2020-06-28 MED ORDER — ACETAMINOPHEN 325 MG PO TABS: 650.0000 mg | ORAL_TABLET | Freq: Four times a day (QID) | ORAL | Status: DC | PRN

## 2020-06-28 MED ORDER — VANCOMYCIN HCL 1500 MG/300ML IV SOLN: 1500.0000 mg | INTRAVENOUS | Status: DC

## 2020-06-28 MED ORDER — ACETAMINOPHEN 650 MG RE SUPP
650.0000 mg | Freq: Four times a day (QID) | RECTAL | Status: DC | PRN
  Filled 2020-06-28: qty 1

## 2020-06-28 MED ORDER — IOHEXOL 350 MG/ML SOLN
50.0000 mL | Freq: Once | INTRAVENOUS | Status: AC | PRN
  Administered 2020-06-28: 50 mL via INTRAVENOUS

## 2020-06-28 MED ORDER — METOPROLOL TARTRATE 5 MG/5ML IV SOLN: 5.0000 mg | INTRAVENOUS | Status: DC | PRN

## 2020-06-28 NOTE — Progress Notes (Signed)
Pharmacy Antibiotic Note  Anna [name] is a 70 y.o. female admitted on 06/28/2020 with sepsis.  Pharmacy has been consulted for vancomycin and cefepime dosing.  Presenting with fever and SOB - WBC 15, LA 3.3, temp 99.6. Scr 1.2 (CrCl 40 mL/min).   Plan: Cefepime 2g IV every 12 hours Vancomycin 1500 mg IV every 48 hours (estAUC 426) Monitor renal fx, cx results, clinical pic, vanc levels as appropriate   Weight: 70.3 kg (155 lb)  Temp (24hrs), Avg:99.6 F (37.6 C), Min:99.6 F (37.6 C), Max:99.6 F (37.6 C)  Recent Labs  Lab 06/28/20 1620  WBC 15.1*  CREATININE 1.20*  LATICACIDVEN 3.3*    Estimated Creatinine Clearance: 40.7 mL/min (A) (by C-G formula based on SCr of 1.2 mg/dL (H)).    Allergies  Allergen Reactions  . Benadryl [Diphenhydramine Hcl] Other (See Comments)    chills    Antimicrobials this admission: Vancomycin 4/11 >>  Cefepime 4/11 >>   Dose adjustments this admission: N/A  Microbiology results: 4/11 BCx: sent  4/11 UCx: sent   4/11 COVID PCR: neg  Thank you for allowing pharmacy to be a part of this patient's care.  Antonietta Jewel, PharmD, BCCCP Clinical Pharmacist  Phone: [phone] 06/28/2020 8:00 PM  Please check AMION for all [place] phone numbers After 10:00 PM, call [place] [phone]

## 2020-06-28 NOTE — ED Notes (Addendum)
Patient shivering and states that she is cold. Pt also c/o SOB and is tachypneic  . Pt rectal temp 101.60f and non rebreather placed on patient. Provider paged.

## 2020-06-28 NOTE — ED Triage Notes (Signed)
Pt bibems for fever/SOB/weakness/tachycardia x1day. Pt denies chest pain. Pt alert and oriented x4. Pt states her temp was 101 at home and took 1000mg  of tylenol at home.

## 2020-06-28 NOTE — ED Notes (Signed)
Provider at bedside

## 2020-06-28 NOTE — ED Notes (Signed)
Lactic acid 3.3 reported to Zenia Resides MD

## 2020-06-28 NOTE — ED Notes (Addendum)
Patient tachycardic at 153. Pts RR also 37. Patient on a non rebreather and very uncomfortable at this time. Provider howerter made aware.

## 2020-06-28 NOTE — ED Notes (Addendum)
Patient agitated and uncomfortable in the bed. Patient trying to get out of the and stating "please help me" provider made aware of patients status and vitals at this time. Patient still tachy 158 and respirations are still in the 30s. Provider made aware at this time. This RN requested provider to come reassess patient at this time.

## 2020-06-28 NOTE — Sepsis Progress Note (Signed)
eLink monitoring code sepsis.  
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
## 2020-06-29 ENCOUNTER — Other Ambulatory Visit (HOSPITAL_COMMUNITY): Payer: Medicare Other

## 2020-06-29 ENCOUNTER — Inpatient Hospital Stay (HOSPITAL_COMMUNITY): Payer: Medicare Other

## 2020-06-29 ENCOUNTER — Other Ambulatory Visit: Payer: Self-pay

## 2020-06-29 DIAGNOSIS — A419 Sepsis, unspecified organism: Secondary | ICD-10-CM | POA: Diagnosis not present

## 2020-06-29 DIAGNOSIS — R011 Cardiac murmur, unspecified: Secondary | ICD-10-CM

## 2020-06-29 DIAGNOSIS — R7989 Other specified abnormal findings of blood chemistry: Secondary | ICD-10-CM | POA: Diagnosis not present

## 2020-06-29 DIAGNOSIS — R6520 Severe sepsis without septic shock: Secondary | ICD-10-CM | POA: Diagnosis not present

## 2020-06-29 LAB — CBC WITH DIFFERENTIAL/PLATELET
Abs Immature Granulocytes: 0.21 10*3/uL — ABNORMAL HIGH (ref 0.00–0.07)
Basophils Absolute: 0.1 10*3/uL (ref 0.0–0.1)
Basophils Relative: 0 %
Eosinophils Absolute: 0 10*3/uL (ref 0.0–0.5)
Eosinophils Relative: 0 %
HCT: 32.6 % — ABNORMAL LOW (ref 36.0–46.0)
Hemoglobin: 10.3 g/dL — ABNORMAL LOW (ref 12.0–15.0)
Immature Granulocytes: 1 %
Lymphocytes Relative: 8 %
Lymphs Abs: 1.3 10*3/uL (ref 0.7–4.0)
MCH: 31.8 pg (ref 26.0–34.0)
MCHC: 31.6 g/dL (ref 30.0–36.0)
MCV: 100.6 fL — ABNORMAL HIGH (ref 80.0–100.0)
Monocytes Absolute: 1.1 10*3/uL — ABNORMAL HIGH (ref 0.1–1.0)
Monocytes Relative: 7 %
Neutro Abs: 13.1 10*3/uL — ABNORMAL HIGH (ref 1.7–7.7)
Neutrophils Relative %: 84 %
Platelets: 246 10*3/uL (ref 150–400)
RBC: 3.24 MIL/uL — ABNORMAL LOW (ref 3.87–5.11)
RDW: 18.5 % — ABNORMAL HIGH (ref 11.5–15.5)
WBC: 15.8 10*3/uL — ABNORMAL HIGH (ref 4.0–10.5)
nRBC: 0 % (ref 0.0–0.2)

## 2020-06-29 LAB — GLUCOSE, CAPILLARY
Glucose-Capillary: 177 mg/dL — ABNORMAL HIGH (ref 70–99)
Glucose-Capillary: 160 mg/dL — ABNORMAL HIGH (ref 70–99)
Glucose-Capillary: 155 mg/dL — ABNORMAL HIGH (ref 70–99)
Glucose-Capillary: 216 mg/dL — ABNORMAL HIGH (ref 70–99)

## 2020-06-29 LAB — TROPONIN I (HIGH SENSITIVITY)
Troponin I (High Sensitivity): 1034 ng/L (ref <18)
Troponin I (High Sensitivity): 932 ng/L (ref <18)

## 2020-06-29 LAB — COMPREHENSIVE METABOLIC PANEL
ALT: 15 U/L (ref 0–44)
AST: 17 U/L (ref 15–41)
Albumin: 2.7 g/dL — ABNORMAL LOW (ref 3.5–5.0)
Alkaline Phosphatase: 71 U/L (ref 38–126)
Anion gap: 11 (ref 5–15)
BUN: 22 mg/dL (ref 8–23)
CO2: 20 mmol/L — ABNORMAL LOW (ref 22–32)
Calcium: 8.7 mg/dL — ABNORMAL LOW (ref 8.9–10.3)
Chloride: 105 mmol/L (ref 98–111)
Creatinine, Ser: 1.23 mg/dL — ABNORMAL HIGH (ref 0.44–1.00)
GFR, Estimated: 47 mL/min — ABNORMAL LOW (ref >60)
Glucose, Bld: 220 mg/dL — ABNORMAL HIGH (ref 70–99)
Potassium: 3.8 mmol/L (ref 3.5–5.1)
Sodium: 136 mmol/L (ref 135–145)
Total Bilirubin: 0.8 mg/dL (ref 0.3–1.2)
Total Protein: 5.9 g/dL — ABNORMAL LOW (ref 6.5–8.1)

## 2020-06-29 LAB — MRSA PCR SCREENING: MRSA by PCR: NEGATIVE

## 2020-06-29 LAB — HEPARIN LEVEL (UNFRACTIONATED)
Heparin Unfractionated: 0.11 IU/mL — ABNORMAL LOW (ref 0.30–0.70)
Heparin Unfractionated: 0.24 IU/mL — ABNORMAL LOW (ref 0.30–0.70)

## 2020-06-29 LAB — BLOOD GAS, ARTERIAL
Acid-base deficit: 5.5 mmol/L — ABNORMAL HIGH (ref 0.0–2.0)
Bicarbonate: 19.8 mmol/L — ABNORMAL LOW (ref 20.0–28.0)
Drawn by: 60087
FIO2: 80
O2 Saturation: 87.6 %
Patient temperature: 37
pCO2 arterial: 41.4 mmHg (ref 32.0–48.0)
pH, Arterial: 7.3 — ABNORMAL LOW (ref 7.350–7.450)
pO2, Arterial: 60.8 mmHg — ABNORMAL LOW (ref 83.0–108.0)

## 2020-06-29 LAB — TSH: TSH: 1.099 u[IU]/mL (ref 0.350–4.500)

## 2020-06-29 LAB — MAGNESIUM
Magnesium: 1.4 mg/dL — ABNORMAL LOW (ref 1.7–2.4)
Magnesium: 1.5 mg/dL — ABNORMAL LOW (ref 1.7–2.4)

## 2020-06-29 LAB — HEMOGLOBIN A1C
Hgb A1c MFr Bld: 7.5 % — ABNORMAL HIGH (ref 4.8–5.6)
Mean Plasma Glucose: 168.55 mg/dL

## 2020-06-29 LAB — PHOSPHORUS: Phosphorus: 3 mg/dL (ref 2.5–4.6)

## 2020-06-29 LAB — PROCALCITONIN: Procalcitonin: 6.95 ng/mL

## 2020-06-29 LAB — LACTIC ACID, PLASMA: Lactic Acid, Venous: 1.6 mmol/L (ref 0.5–1.9)

## 2020-06-29 MED ORDER — FUROSEMIDE 10 MG/ML IJ SOLN
40.0000 mg | Freq: Once | INTRAMUSCULAR | Status: AC
  Administered 2020-06-29: 40 mg via INTRAVENOUS
  Filled 2020-06-29: qty 4

## 2020-06-29 MED ORDER — LEVALBUTEROL HCL 1.25 MG/0.5ML IN NEBU
INHALATION_SOLUTION | RESPIRATORY_TRACT | Status: AC
  Filled 2020-06-29: qty 0.5

## 2020-06-29 MED ORDER — PREGABALIN 25 MG PO CAPS
50.0000 mg | ORAL_CAPSULE | Freq: Two times a day (BID) | ORAL | Status: DC
  Administered 2020-06-29 – 2020-07-06 (×14): 50 mg via ORAL
  Filled 2020-06-29: qty 2
  Filled 2020-06-29 (×2): qty 1
  Filled 2020-06-29: qty 2
  Filled 2020-06-29: qty 1
  Filled 2020-06-29 (×6): qty 2
  Filled 2020-06-29: qty 1
  Filled 2020-06-29 (×2): qty 2

## 2020-06-29 MED ORDER — METOPROLOL TARTRATE 25 MG PO TABS
25.0000 mg | ORAL_TABLET | Freq: Two times a day (BID) | ORAL | Status: AC
  Administered 2020-06-29 – 2020-07-03 (×8): 25 mg via ORAL
  Filled 2020-06-29 (×8): qty 1

## 2020-06-29 MED ORDER — HEPARIN BOLUS VIA INFUSION
4000.0000 [IU] | Freq: Once | INTRAVENOUS | Status: AC
  Administered 2020-06-29: 4000 [IU] via INTRAVENOUS
  Filled 2020-06-29: qty 4000

## 2020-06-29 MED ORDER — HEPARIN (PORCINE) 25000 UT/250ML-% IV SOLN
1400.0000 [IU]/h | INTRAVENOUS | Status: DC
  Administered 2020-06-29: 850 [IU]/h via INTRAVENOUS
  Administered 2020-06-30: 1350 [IU]/h via INTRAVENOUS
  Administered 2020-06-30: 1200 [IU]/h via INTRAVENOUS
  Administered 2020-07-01: 1400 [IU]/h via INTRAVENOUS
  Filled 2020-06-29 (×4): qty 250

## 2020-06-29 MED ORDER — MAGNESIUM SULFATE 2 GM/50ML IV SOLN
2.0000 g | Freq: Once | INTRAVENOUS | Status: AC
  Administered 2020-06-29: 2 g via INTRAVENOUS
  Filled 2020-06-29: qty 50

## 2020-06-29 MED ORDER — GABAPENTIN 300 MG PO CAPS: 300.0000 mg | ORAL_CAPSULE | Freq: Two times a day (BID) | ORAL | Status: DC

## 2020-06-29 MED ORDER — LEVALBUTEROL HCL 0.63 MG/3ML IN NEBU
0.6300 mg | INHALATION_SOLUTION | Freq: Once | RESPIRATORY_TRACT | Status: DC
  Filled 2020-06-29: qty 3

## 2020-06-29 MED ORDER — LACTATED RINGERS IV SOLN: INTRAVENOUS | Status: DC

## 2020-06-29 MED ORDER — MORPHINE SULFATE (PF) 2 MG/ML IV SOLN
1.0000 mg | Freq: Once | INTRAVENOUS | Status: AC
  Administered 2020-06-29: 1 mg via INTRAVENOUS
  Filled 2020-06-29: qty 1

## 2020-06-29 MED ORDER — HEPARIN BOLUS VIA INFUSION
2000.0000 [IU] | Freq: Once | INTRAVENOUS | Status: AC
  Administered 2020-06-29: 2000 [IU] via INTRAVENOUS
  Filled 2020-06-29: qty 2000

## 2020-06-29 MED ORDER — METOPROLOL TARTRATE 5 MG/5ML IV SOLN
5.0000 mg | Freq: Once | INTRAVENOUS | Status: AC
  Administered 2020-06-29: 5 mg via INTRAVENOUS
  Filled 2020-06-29: qty 5

## 2020-06-29 NOTE — Progress Notes (Signed)
ANTICOAGULATION CONSULT NOTE - Initial Consult  Pharmacy Consult for Heparin Indication: chest pain/ACS  Allergies  Allergen Reactions  . Benadryl [Diphenhydramine Hcl] Other (See Comments)    chills    Patient Measurements: Height: 5\' 4"  (162.6 cm) Weight: 71.6 kg (157 lb 13.6 oz) IBW/kg (Calculated) : 54.7  Vital Signs: Temp: 97.7 F (36.5 C) (04/12 0044) Temp Source: Oral (04/12 0044) BP: 108/70 (04/12 0044) Pulse Rate: 132 (04/12 0044)  Labs: Recent Labs    06/28/20 1620 06/28/20 2315  HGB 10.5*  --   HCT 32.5*  --   PLT 274  --   APTT 32  --   LABPROT 13.6  --   INR 1.1  --   CREATININE 1.20*  --   TROPONINIHS  --  1,034*    Estimated Creatinine Clearance: 41.1 mL/min (A) (by C-G formula based on SCr of 1.2 mg/dL (H)).   Medical History: Past Medical History:  Diagnosis Date  . Arthritis   . Chronic kidney disease    kidney stone  . Diabetes mellitus without complication (HCC)    on Metformin  . Fibromyalgia   . History of kidney stones   . Hypertension   . Insomnia   . 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 Pain in limb   . Psoriasis   . RLS (restless legs syndrome)   . Varicose veins     Medications:  Medications Prior to Admission  Medication Sig Dispense Refill Last Dose  . acetaminophen (TYLENOL) 325 MG tablet Take 2 tablets (650 mg total) by mouth every 6 (six) hours as needed for mild pain, fever or headache.     Marland Kitchen amLODipine (NORVASC) 10 MG tablet Take 10 mg by mouth daily.     Marland Kitchen amLODipine-olmesartan (AZOR) 5-20 MG tablet Take 1 tablet by mouth daily.     Marland Kitchen ascorbic acid (VITAMIN C) 500 MG tablet Take 1 tablet by mouth daily.     Marland Kitchen aspirin EC 81 MG tablet Take 81 mg by mouth daily.     Marland Kitchen atorvastatin (LIPITOR) 40 MG tablet Take 40 mg by mouth daily.      . calcium carbonate (OSCAL) 1500 (600 Ca) MG TABS tablet Take 600 mg by mouth daily.     . celecoxib (CELEBREX) 200 MG capsule Take 1 capsule (200 mg total) by mouth 2  (two) times daily as needed for mild pain. 30 capsule 2   . gabapentin (NEURONTIN) 100 MG capsule Take 1 capsule (100 mg total) by mouth 3 (three) times daily. When necessary for neuropathy pain (Patient not taking: No sig reported) 90 capsule 3   . Glucosamine HCl (GLUCOSAMINE PO) Take 1,000 mg by mouth daily.      . hydroxyurea (HYDREA) 500 MG capsule Take 1 capsule (500 mg total) by mouth daily. May take with food to minimize GI side effects. 30 capsule 2   . metFORMIN (GLUCOPHAGE-XR) 500 MG 24 hr tablet Take 500 mg by mouth daily.     . nortriptyline (PAMELOR) 50 MG capsule Take 50 mg by mouth at bedtime.     . potassium chloride (KLOR-CON) 10 MEQ tablet Take 1 tablet (10 mEq total) by mouth daily for 3 days. 3 tablet 0   . pregabalin (LYRICA) 25 MG capsule Take 25 mg by mouth in the morning and at bedtime.       Assessment: 70 y.o. female with elevated troponin, possible ACS, for heparn Goal of Therapy:  Heparin level 0.3-0.7 units/ml Monitor platelets by anticoagulation protocol: Yes   Plan:  Heparin 4000 units IV bolus, then start heparin 850 units/hr Check heparin level in 8 hours.   Caryl Pina 06/29/2020,1:23 AM

## 2020-06-29 NOTE — Progress Notes (Signed)
   06/29/20 0044  Assess: MEWS Score  Temp 97.7 F (36.5 C)  BP 108/70  Pulse Rate (!) 132  ECG Heart Rate (!) 132  Resp (!) 24  Level of Consciousness Alert  SpO2 94 %  O2 Device Nasal Cannula  O2 Flow Rate (L/min) 3 L/min  Assess: MEWS Score  MEWS Temp 0  MEWS Systolic 0  MEWS Pulse 3  MEWS RR 1  MEWS LOC 0  MEWS Score 4  MEWS Score Color Red  Assess: if the MEWS score is Yellow or Red  Were vital signs taken at a resting state? Yes  Focused Assessment No change from prior assessment  Early Detection of Sepsis Score *See Row Information* Medium  MEWS guidelines implemented *See Row Information* Yes  Treat  MEWS Interventions Escalated (See documentation below)  Pain Scale 0-10  Pain Score 0  Take Vital Signs  Increase Vital Sign Frequency  Red: Q 1hr X 4 then Q 4hr X 4, if remains red, continue Q 4hrs  Escalate  MEWS: Escalate Red: discuss with charge nurse/RN and provider, consider discussing with RRT  Notify: Charge Nurse/RN  Name of Charge Nurse/RN Notified [place], RN  Date Charge Nurse/RN Notified 06/28/20  Time Charge Nurse/RN Notified 0044  Notify: Provider  Provider Name/Title Dr. Kipp Brood  Date Provider Notified 06/29/20  Time Provider Notified [phone]  Notification Type Page  Notification Reason Other (Comment) ([place])  Provider response At bedside;See new orders  Date of Provider Response 06/29/20  Time of Provider Response 0100  Document  Progress note created (see row info) Yes

## 2020-06-29 NOTE — Progress Notes (Signed)
Patient's Vital signs MEWs turned to red for respiration and heart rate. Notified on call MD. Rapid response nurse and charge nurse. Discussed patient's conditions with team. Given medicine as per ordered. Patient stabilize after the interventions. Will continue monitor.

## 2020-06-29 NOTE — Progress Notes (Addendum)
PROGRESS NOTE                                                                                                                                                                                                             Patient Demographics:    Anna [name], is a 70 y.o. female, DOB - Oct 13, 1950, FXT:[id number]  Outpatient Primary MD for the patient is Leanna Battles, MD    LOS - 1  Admit date - 06/28/2020    Chief Complaint  Patient presents with  . Shortness of Breath  . Weakness  . Tachycardia  . Fever       Brief Narrative (HPI from H&P) - Anna [name] is a 70 y.o. female with medical history significant for recurrent urinary tract infections, restless leg syndrome, hypertension, type 2 diabetes mellitus, stage III chronic kidney disease with baseline creatinine 1.1-1.4, presented to [facility] with high fever was diagnosed with severe sepsis due to UTI and admitted to the hospital.   Subjective:    Anna [name] today has, No headache, No chest pain, No abdominal pain - No Nausea, No new weakness tingling or numbness, no SOB.   Assessment  & Plan :     1.  Severe sepsis due to current UTI.  High procalcitonin suggestive of possible bacteremia, has been placed on cefepime which will be continued, monitor cultures and procalcitonin, hydrate with IV appears nontoxic sepsis pathophysiology has likely resolved.  Will check renal ultrasound with outpatient urology follow-up post discharge.  2.  Chest x-ray with mild atelectasis, clinically not volume overloaded.  I-S and flutter valve, set in chair, gently hydrate and monitor.  3.  Non-ACS pattern elevation of troponin with sinus tachycardia.  Likely caused by sepsis creating demand mismatch, placed on aspirin, beta-blocker and statin, chest pain-free, check echocardiogram to evaluate EF and wall motion, EKG Non sp changes, Hep gtt started upon admission,  continue 24hrs till TEE back.  CTA stable, once she has recovered from sepsis and UTI outpatient cardiology follow-up.  4.  Hypertension.  Placed on beta-blocker.  5.  Hypomagnesemia.  Replaced.  6.  Dyslipidemia.  On statin.  7.  CKD 3A.  Baseline creatinine close to 1.4, had mild AKI resolved with hydration.  Monitor.  Hold nephrotoxins.  8.  Ongoing smoking.  Counseled to quit.  Condition - Extremely Guarded  Family Communication  : Husband Dorothyann Peng [phone] on 06/30/2018 message left @ 9.54 am  Code Status :  Full  Consults  :  Cards  PUD Prophylaxis : PPI   Procedures  :     CTA - No PE  TTE  Renal US      Disposition Plan  :    Status is: Inpatient  Remains inpatient appropriate because:IV treatments appropriate due to intensity of illness or inability to take PO   Dispo: The patient is from: Home              Anticipated d/c is to: Home              Patient currently is not medically stable to d/c.   Difficult to place patient No   DVT Prophylaxis  :    Heparin   Lab Results  Component Value Date   PLT 246 06/29/2020    Diet :  Diet Order            Diet Carb Modified Fluid consistency: Thin; Room service appropriate? Yes  Diet effective now                  Inpatient Medications  Scheduled Meds: . aspirin EC  81 mg Oral Daily  . atorvastatin  40 mg Oral Daily  . insulin aspart  0-9 Units Subcutaneous TID WC  . pregabalin  50 mg Oral BID   Continuous Infusions: . ceFEPime (MAXIPIME) IV 2 g (06/29/20 2376)  . heparin 850 Units/hr (06/29/20 0224)  . magnesium sulfate bolus IVPB 2 g (06/29/20 0924)   PRN Meds:.acetaminophen **OR** acetaminophen, melatonin, metoprolol tartrate  Antibiotics  :    Anti-infectives (From admission, onward)   Start     Dose/Rate Route Frequency Ordered Stop   06/30/20 1930  vancomycin (VANCOREADY) IVPB 1500 mg/300 mL  Status:  Discontinued        1,500 mg 150 mL/hr over 120 Minutes Intravenous  Every 48 hours 06/28/20 2004 06/28/20 2125   06/29/20 0700  ceFEPIme (MAXIPIME) 2 g in sodium chloride 0.9 % 100 mL IVPB        2 g 200 mL/hr over 30 Minutes Intravenous Every 12 hours 06/28/20 2004     06/28/20 1830  ceFEPIme (MAXIPIME) 2 g in sodium chloride 0.9 % 100 mL IVPB        2 g 200 mL/hr over 30 Minutes Intravenous  Once 06/28/20 1815 06/28/20 1918   06/28/20 1830  metroNIDAZOLE (FLAGYL) IVPB 500 mg        500 mg 100 mL/hr over 60 Minutes Intravenous  Once 06/28/20 1815 06/28/20 1944   06/28/20 1830  vancomycin (VANCOREADY) IVPB 1500 mg/300 mL        1,500 mg 150 mL/hr over 120 Minutes Intravenous  Once 06/28/20 1815 06/28/20 2123       Time Spent in minutes  30   Lala Lund M.D on 06/29/2020 at 9:40 AM  To page go to [email or web address]   [facility] -  Office  [phone]     See all Orders from today for further details    Objective:   Vitals:   06/29/20 0321 06/29/20 0400 06/29/20 0441 06/29/20 0843  BP:  104/60 108/71 125/73  Pulse:  (!) 116 (!) 114 (!) 111  Resp:  20 (!) 24 (!) 21  Temp:  97.8 F (36.6 C) 97.7 F (36.5 C) 97.8 F (36.6 C)  TempSrc:  Oral Oral  Oral  SpO2:  99%  97%  Weight: 71.6 kg     Height:        Wt Readings from Last 3 Encounters:  06/29/20 71.6 kg  03/02/20 69.5 kg  01/11/20 72.1 kg     Intake/Output Summary (Last 24 hours) at 06/29/2020 0940 Last data filed at 06/29/2020 7544 Gross per 24 hour  Intake 5.86 ml  Output 425 ml  Net -419.14 ml     Physical Exam  Awake Alert, No new F.N deficits, Normal affect [place].AT,PERRAL Supple Neck,No JVD, No cervical lymphadenopathy appriciated.  Symmetrical Chest wall movement, Good air movement bilaterally, CTAB RRR,No Gallops,Rubs or new Murmurs, No Parasternal Heave +ve B.Sounds, Abd Soft, No tenderness, No organomegaly appriciated, No rebound - guarding or rigidity. No Cyanosis, Clubbing or edema, No new Rash or bruise       Data Review:    CBC Recent Labs   Lab 06/28/20 1620 06/29/20 0246  WBC 15.1* 15.8*  HGB 10.5* 10.3*  HCT 32.5* 32.6*  PLT 274 246  MCV 101.2* 100.6*  MCH 32.7 31.8  MCHC 32.3 31.6  RDW 18.2* 18.5*  LYMPHSABS 0.7 1.3  MONOABS 0.6 1.1*  EOSABS 0.0 0.0  BASOSABS 0.1 0.1    Recent Labs  Lab 06/28/20 1620 06/28/20 2001 06/28/20 2315 06/29/20 0246  NA 134*  --   --  136  K 4.1  --   --  3.8  CL 105  --   --  105  CO2 19*  --   --  20*  GLUCOSE 310*  --   --  220*  BUN 24*  --   --  22  CREATININE 1.20*  --   --  1.23*  CALCIUM 8.6*  --   --  8.7*  AST 18  --   --  17  ALT 13  --   --  15  ALKPHOS 78  --   --  71  BILITOT 1.1  --   --  0.8  ALBUMIN 2.8*  --   --  2.7*  MG  --   --  1.4* 1.5*  PROCALCITON  --   --  6.95  --   LATICACIDVEN 3.3* 2.5*  --  1.6  INR 1.1  --   --   --   TSH  --   --   --  1.099  HGBA1C  --   --   --  7.5*    ------------------------------------------------------------------------------------------------------------------ No results for input(s): CHOL, HDL, LDLCALC, TRIG, CHOLHDL, LDLDIRECT in the last 72 hours.  Lab Results  Component Value Date   HGBA1C 7.5 (H) 06/29/2020   ------------------------------------------------------------------------------------------------------------------ Recent Labs    06/29/20 0246  TSH 1.099    Cardiac Enzymes No results for input(s): CKMB, TROPONINI, MYOGLOBIN in the last 168 hours.  Invalid input(s): CK ------------------------------------------------------------------------------------------------------------------ No results found for: BNP  Micro Results Recent Results (from the past 240 hour(s))  Resp Panel by RT-PCR (Flu A&B, Covid) Nasopharyngeal Swab     Status: None   Collection Time: 06/28/20  4:20 PM   Specimen: Nasopharyngeal Swab; Nasopharyngeal(NP) swabs in vial transport medium  Result Value Ref Range Status   SARS Coronavirus 2 by RT PCR NEGATIVE NEGATIVE Final    Comment: (NOTE) SARS-CoV-2 target  nucleic acids are NOT DETECTED.  The SARS-CoV-2 RNA is generally detectable in upper respiratory specimens during the acute phase of infection. The lowest concentration of SARS-CoV-2 viral copies this assay can detect is 138 copies/mL. A  negative result does not preclude SARS-Cov-2 infection and should not be used as the sole basis for treatment or other patient management decisions. A negative result may occur with  improper specimen collection/handling, submission of specimen other than nasopharyngeal swab, presence of viral mutation(s) within the areas targeted by this assay, and inadequate number of viral copies(<138 copies/mL). A negative result must be combined with clinical observations, patient history, and epidemiological information. The expected result is Negative.  Fact Sheet for Patients:  EntrepreneurPulse.com.au  Fact Sheet for Healthcare Providers:  IncredibleEmployment.be  This test is no t yet approved or cleared by the [place] FDA and  has been authorized for detection and/or diagnosis of SARS-CoV-2 by FDA under an Emergency Use Authorization (EUA). This EUA will remain  in effect (meaning this test can be used) for the duration of the COVID-19 declaration under Section 564(b)(1) of the Act, 21 U.S.C.section 360bbb-3(b)(1), unless the authorization is terminated  or revoked sooner.       Influenza A by PCR NEGATIVE NEGATIVE Final   Influenza B by PCR NEGATIVE NEGATIVE Final    Comment: (NOTE) The Xpert Xpress SARS-CoV-2/FLU/RSV plus assay is intended as an aid in the diagnosis of influenza from Nasopharyngeal swab specimens and should not be used as a sole basis for treatment. Nasal washings and aspirates are unacceptable for Xpert Xpress SARS-CoV-2/FLU/RSV testing.  Fact Sheet for Patients: EntrepreneurPulse.com.au  Fact Sheet for Healthcare  Providers: IncredibleEmployment.be  This test is not yet approved or cleared by the [place] FDA and has been authorized for detection and/or diagnosis of SARS-CoV-2 by FDA under an Emergency Use Authorization (EUA). This EUA will remain in effect (meaning this test can be used) for the duration of the COVID-19 declaration under Section 564(b)(1) of the Act, 21 U.S.C. section 360bbb-3(b)(1), unless the authorization is terminated or revoked.  Performed at [facility], [place] [name] [place], [place] [address]   Blood culture (routine single)     Status: None (Preliminary result)   Collection Time: 06/28/20  4:23 PM   Specimen: BLOOD  Result Value Ref Range Status   Specimen Description BLOOD SITE NOT SPECIFIED  Final   Special Requests   Final    BOTTLES DRAWN AEROBIC AND ANAEROBIC Blood Culture results may not be optimal due to an inadequate volume of blood received in culture bottles   Culture   Final    NO GROWTH < 12 HOURS Performed at [facility], [place] [address] [place], [place] [address]    Report Status PENDING  Incomplete    Radiology Reports  CT Angio Chest PE W/Cm &/Or Wo Cm  Result Date: 06/28/2020 CLINICAL DATA:  PE suspected, high prob Shortness of breath.  Weakness.  Tachycardia. EXAM: CT ANGIOGRAPHY CHEST WITH CONTRAST TECHNIQUE: Multidetector CT imaging of the chest was performed using the standard protocol during bolus administration of intravenous contrast. Multiplanar CT image reconstructions and MIPs were obtained to evaluate the vascular anatomy. CONTRAST:  90mL OMNIPAQUE IOHEXOL 350 MG/ML SOLN COMPARISON:  Radiograph earlier this day.  Chest CTA 01/14/2020 FINDINGS: Cardiovascular: There are no filling defects within the pulmonary arteries to suggest pulmonary embolus. Aortic atherosclerosis. Conventional branching pattern from the aortic arch. No evidence of dissection or acute aortic findings. Normal heart size. Small  amount of pericardial fluid anteriorly is similar to prior exam. Coronary artery calcifications. Mediastinum/Nodes: No enlarged mediastinal or hilar lymph nodes. No thyroid nodule. No esophageal wall thickening. Lungs/Pleura: Linear right upper lobe scarring is unchanged  from prior exam. Previous pleural effusions have resolved. Previous septal thickening has resolved. No acute airspace disease. Trachea and central bronchi are patent. Mild central bronchial thickening. Upper Abdomen: No acute findings. Vascular calcifications. Suspected hepatic steatosis. Musculoskeletal: There are no acute or suspicious osseous abnormalities. Review of the MIP images confirms the above findings. IMPRESSION: 1. No pulmonary embolus. 2. Mild central bronchial thickening, can be seen with bronchitis or reactive airways disease. 3. Right upper lobe scarring. 4. Aortic atherosclerosis.  Coronary artery calcifications. Aortic Atherosclerosis (ICD10-I70.0). Electronically Signed   By: Keith Rake M.D.   On: 06/28/2020 19:45     DG Chest Port 1 View  Result Date: 06/28/2020 CLINICAL DATA:  Fever and shortness of breath. EXAM: PORTABLE CHEST 1 VIEW COMPARISON:  June 28, 2020 (4:44 p.m.) FINDINGS: Decreased lung volumes are again seen with mild diffusely increased interstitial lung markings. This represents a new finding when compared to the prior study. Mild areas of atelectasis and/or infiltrate are seen within the bilateral infrahilar regions and medial aspect of the upper left lung. There is no evidence of a pleural effusion or pneumothorax. The heart size and mediastinal contours are within normal limits. The visualized skeletal structures are unremarkable. IMPRESSION: 1. Interval development of mild left upper lobe and bilateral infrahilar atelectasis and/or infiltrate since the prior study. 2. Additional findings consistent with mild interstitial edema. Electronically Signed   By: Virgina Norfolk M.D.   On: 06/28/2020  22:29   DG Chest Port 1 View  Result Date: 06/28/2020 CLINICAL DATA:  Questionable sepsis, fever, shortness of breath, weakness, tachycardia EXAM: PORTABLE CHEST 1 VIEW COMPARISON:  01/13/2020 FINDINGS: Cardiomegaly. Low volume AP portable examination without acute airspace opacity. The visualized skeletal structures are unremarkable. IMPRESSION: Cardiomegaly without acute abnormality of the lungs in AP portable projection. Electronically Signed   By: Eddie Candle M.D.   On: 06/28/2020 16:56
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
## 2020-06-29 NOTE — Evaluation (Signed)
Physical Therapy Evaluation Patient Details Name: Anna [name] MRN: [id number] DOB: 08-30-1950 Today's Date: 06/29/2020   History of Present Illness  Pt is a 70 y/o female admitted 4/11 secondary to sepsis from  UTI. PMH includes DM, HTN, and chronic back pain s/p surgery.  Clinical Impression  Pt admitted secondary to problem above with deficits below. Pt requiring min to min guard A for short distance ambulation within the room. Pt reporting increased pain which limited mobility. Reports husband is available to assist at d/c as needed. Feel she would benefit from outpatient PT at d/c, however, pt requesting to get a place that is closer to her home if possible. Will continue to follow acutely.      Follow Up Recommendations Outpatient PT    Equipment Recommendations  None recommended by PT    Recommendations for Other Services       Precautions / Restrictions Precautions Precautions: Fall Restrictions Weight Bearing Restrictions: No      Mobility  Bed Mobility Overal bed mobility: Needs Assistance Bed Mobility: Supine to Sit     Supine to sit: Min assist     General bed mobility comments: Min A to assist with scooting hips to EOB.    Transfers Overall transfer level: Needs assistance Equipment used: Rolling walker (2 wheeled) Transfers: Sit to/from Stand Sit to Stand: Min guard         General transfer comment: Min guard for safety.  Ambulation/Gait Ambulation/Gait assistance: Min guard;Min assist Gait Distance (Feet): 15 Feet Assistive device: Rolling walker (2 wheeled) Gait Pattern/deviations: Step-through pattern;Decreased stride length;Trunk flexed Gait velocity: Decreased   General Gait Details: Reporting increased back pain during gait which limited mobility to within the room. Min guard to min A for steadying throughout. Flexed posture secondary to pain.  Stairs            Wheelchair Mobility    Modified Rankin (Stroke Patients Only)        Balance Overall balance assessment: Needs assistance Sitting-balance support: No upper extremity supported;Feet supported Sitting balance-Leahy Scale: Fair     Standing balance support: Bilateral upper extremity supported;During functional activity Standing balance-Leahy Scale: Poor Standing balance comment: Reliant on BUE support                             Pertinent Vitals/Pain Pain Assessment: Faces Faces Pain Scale: Hurts even more Pain Location: back into RLE Pain Descriptors / Indicators: Aching Pain Intervention(s): Limited activity within patient's tolerance;Monitored during session;Repositioned    Home Living Family/patient expects to be discharged to:: Private residence Living Arrangements: Spouse/significant other Available Help at Discharge: Family;Available 24 hours/day Type of Home: House Home Access: Stairs to enter Entrance Stairs-Rails: Left Entrance Stairs-Number of Steps: 4 Home Layout: Multi-level;Able to live on main level with bedroom/bathroom Home Equipment: Grab bars - tub/shower;Toilet riser;Grab bars - toilet;Walker - 2 wheels      Prior Function Level of Independence: Independent with assistive device(s)         Comments: Normally uses RW for ambulation. Reporting independence with ADL tasks.     Hand Dominance        Extremity/Trunk Assessment   Upper Extremity Assessment Upper Extremity Assessment: Defer to OT evaluation    Lower Extremity Assessment Lower Extremity Assessment: Generalized weakness;RLE deficits/detail RLE Deficits / Details: Reports RLE pain at baseline.    Cervical / Trunk Assessment Cervical / Trunk Assessment: Other exceptions Cervical / Trunk Exceptions: chronic back  pain  Communication   Communication: No difficulties  Cognition Arousal/Alertness: Awake/alert Behavior During Therapy: WFL for tasks assessed/performed Overall Cognitive Status: Within Functional Limits for tasks assessed                                         General Comments General comments (skin integrity, edema, etc.): HR elevated to 122 during gait. Oxygen sats >90% on RA    Exercises     Assessment/Plan    PT Assessment Patient needs continued PT services  PT Problem List Decreased strength;Decreased activity tolerance;Decreased balance;Decreased mobility;Decreased range of motion;Decreased knowledge of use of DME;Decreased knowledge of precautions;Pain       PT Treatment Interventions DME instruction;Gait training;Stair training;Functional mobility training;Therapeutic activities;Therapeutic exercise;Balance training;Patient/family education    PT Goals (Current goals can be found in the Care Plan section)  Acute Rehab PT Goals Patient Stated Goal: to see her grandkids PT Goal Formulation: With patient Time For Goal Achievement: 07/13/20 Potential to Achieve Goals: Good    Frequency Min 3X/week   Barriers to discharge        Co-evaluation               AM-PAC PT "6 Clicks" Mobility  Outcome Measure Help needed turning from your back to your side while in a flat bed without using bedrails?: None Help needed moving from lying on your back to sitting on the side of a flat bed without using bedrails?: A Little Help needed moving to and from a bed to a chair (including a wheelchair)?: A Little Help needed standing up from a chair using your arms (e.g., wheelchair or bedside chair)?: A Little Help needed to walk in hospital room?: A Little Help needed climbing 3-5 steps with a railing? : A Lot 6 Click Score: 18    End of Session   Activity Tolerance: Patient limited by pain Patient left: in chair;with call bell/phone within reach;with nursing/sitter in room Nurse Communication: Mobility status PT Visit Diagnosis: Unsteadiness on feet (R26.81);Muscle weakness (generalized) (M62.81);Pain Pain - part of body:  (back)    Time: 7939-0300 PT Time Calculation (min)  (ACUTE ONLY): 31 min   Charges:   PT Evaluation $PT Eval Moderate Complexity: 1 Mod PT Treatments $Therapeutic Activity: 8-22 mins        Anna [name], DPT  Acute Rehabilitation Services  Pager: [phone] Office: [phone]   Anna [name] 06/29/2020, 1:50 PM
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
## 2020-06-29 NOTE — Progress Notes (Signed)
  Echocardiogram 2D Echocardiogram has been performed.  [name] F 06/29/2020, 6:12 PM

## 2020-06-29 NOTE — Significant Event (Addendum)
Rapid Response Event Note   Reason for Call : SOB/tachypnea Initial Focused Assessment:  Notified by RN regarding pt with SOB and tachypnea 35-37 bpm. Provider notified and orders received. Pt was placed on BIPAP prior to my arrival.  Pt is alert, oriented x4, a little anxious. RR 35 but no accessory muscle use or labored breathing. BBS expiratory wheezes with rales in the bases. PCXR done. Skin is pale, warm and a little diaphoretic.  Pt is able to tolerate BIPAP and confirmed she was breathing easier with BIPAP.   2100-afebrile, HR 138 ST, 140/88 (101), RR 40 with sats 98% on 40% Fio2 BIPAP 12/6.   Interventions:  -No acute interventions by RRRN  Plan of Care:  -keep sats > 92% -May wean off BIPAP as tolerated when SOB is improved     MD Notified: Dr. Tonie Griffith (at bedside) Call Time: 2045 Arrival Time:  2100 End Time: 2130  [name] Done, RN

## 2020-06-29 NOTE — Progress Notes (Signed)
Called by RN that pt having increased SOB and requiring more oxygen and placed on NRB. Pt denies Chest pain.  Reviewed chart. Echo done this afternoon but no report in system at this time. CXR obtained which shows cardiomegaly and increased interstitial markings.  ABG obtained and pt started on Bipap.   Pt seen at bedside. No chest pain. BP elevated and HR in 120's.  Pt given lasix.  Check BNP, troponin now.  Given metoprolol IV.  Monitor closely.

## 2020-06-29 NOTE — Progress Notes (Signed)
Pt. complaining of pain in bilateral feet  pt states "it feels like nerve pain",MD notified, and   Verbal order from Dr. Candiss Norse for Gabapentin 300 mg BID.

## 2020-06-29 NOTE — Progress Notes (Signed)
Notified by RN that pt arrived from ER and has elevated RR and HR. HR in 120's. No complaint of chest pain. Reviewed chart. Admitted with sepsis due to UTI and new onset CHF.  Troponin is over 1000.  Placed on heparin infusion for ACS. Consulted Cardiology, Dr. Paticia Stack, who is going to come evaluate pt.  EKG obtained which shows ST with lateral T wave changes possibly due to ischemia. Not significantly different than EKG from ER a few hours ago

## 2020-06-29 NOTE — Progress Notes (Signed)
[place] for Heparin Indication: chest pain/ACS  Allergies  Allergen Reactions  . Benadryl [Diphenhydramine Hcl] Other (See Comments)    chills    Patient Measurements: Height: 5\' 4"  (162.6 cm) Weight: 71.6 kg (157 lb 13.6 oz) IBW/kg (Calculated) : 54.7 Heparin Dosing Weight: 69.3 kg   Vital Signs: Temp: 98.7 F (37.1 C) (04/12 2000) Temp Source: Oral (04/12 2000) BP: 110/75 (04/12 1649) Pulse Rate: 127 (04/12 2058)  Labs: Recent Labs    06/28/20 1620 06/28/20 2315 06/29/20 0246 06/29/20 0945 06/29/20 2032  HGB 10.5*  --  10.3*  --   --   HCT 32.5*  --  32.6*  --   --   PLT 274  --  246  --   --   APTT 32  --   --   --   --   LABPROT 13.6  --   --   --   --   INR 1.1  --   --   --   --   HEPARINUNFRC  --   --   --  0.11* 0.24*  CREATININE 1.20*  --  1.23*  --   --   TROPONINIHS  --  1,034* 932*  --   --     Estimated Creatinine Clearance: 40.1 mL/min (A) (by C-G formula based on SCr of 1.23 mg/dL (H)).   Assessment: 70 yo female admitted 06/28/2020 with sepsis found to have elevated troponins to 1034. Pharmacy consulted to dose heparin for ACS.  Heparin level is slightly sub-therapeutic at 0.24 units/mL on heparin 1050 units/hr.  No issue with heparin infusion.  Goal of Therapy:  Heparin level 0.3-0.7 units/ml Monitor platelets by anticoagulation protocol: Yes   Plan:  Increase heparin gtt to 1200 units/hr F/U AM labs  Dawanda Mapel D. Mina Marble, PharmD, BCPS, [place] 06/29/2020, 9:18 PM

## 2020-06-30 ENCOUNTER — Inpatient Hospital Stay (HOSPITAL_COMMUNITY): Payer: Medicare Other

## 2020-06-30 DIAGNOSIS — R6520 Severe sepsis without septic shock: Secondary | ICD-10-CM | POA: Diagnosis not present

## 2020-06-30 DIAGNOSIS — A419 Sepsis, unspecified organism: Secondary | ICD-10-CM | POA: Diagnosis not present

## 2020-06-30 LAB — CBC WITH DIFFERENTIAL/PLATELET
Abs Immature Granulocytes: 0.17 10*3/uL — ABNORMAL HIGH (ref 0.00–0.07)
Basophils Absolute: 0.1 10*3/uL (ref 0.0–0.1)
Basophils Relative: 0 %
Eosinophils Absolute: 0 10*3/uL (ref 0.0–0.5)
Eosinophils Relative: 0 %
HCT: 29.2 % — ABNORMAL LOW (ref 36.0–46.0)
Hemoglobin: 9.6 g/dL — ABNORMAL LOW (ref 12.0–15.0)
Immature Granulocytes: 1 %
Lymphocytes Relative: 8 %
Lymphs Abs: 1.2 10*3/uL (ref 0.7–4.0)
MCH: 32.5 pg (ref 26.0–34.0)
MCHC: 32.9 g/dL (ref 30.0–36.0)
MCV: 99 fL (ref 80.0–100.0)
Monocytes Absolute: 1.1 10*3/uL — ABNORMAL HIGH (ref 0.1–1.0)
Monocytes Relative: 7 %
Neutro Abs: 13 10*3/uL — ABNORMAL HIGH (ref 1.7–7.7)
Neutrophils Relative %: 84 %
Platelets: 305 10*3/uL (ref 150–400)
RBC: 2.95 MIL/uL — ABNORMAL LOW (ref 3.87–5.11)
RDW: 18.4 % — ABNORMAL HIGH (ref 11.5–15.5)
WBC: 15.5 10*3/uL — ABNORMAL HIGH (ref 4.0–10.5)
nRBC: 0 % (ref 0.0–0.2)

## 2020-06-30 LAB — ECHOCARDIOGRAM COMPLETE
AR max vel: 1.2 cm2
AV Area VTI: 1.13 cm2
AV Area mean vel: 1.17 cm2
AV Mean grad: 6 mmHg
AV Peak grad: 10.8 mmHg
Ao pk vel: 1.64 m/s
Calc EF: 30.5 %
Height: 64 in
S' Lateral: 3.7 cm
Single Plane A2C EF: 32.4 %
Single Plane A4C EF: 21.4 %
Weight: 2525.59 oz

## 2020-06-30 LAB — URINE CULTURE: Culture: NO GROWTH

## 2020-06-30 LAB — HEPARIN LEVEL (UNFRACTIONATED)
Heparin Unfractionated: 0.23 IU/mL — ABNORMAL LOW (ref 0.30–0.70)
Heparin Unfractionated: 0.22 IU/mL — ABNORMAL LOW (ref 0.30–0.70)
Heparin Unfractionated: 0.32 IU/mL (ref 0.30–0.70)

## 2020-06-30 LAB — COMPREHENSIVE METABOLIC PANEL
ALT: 16 U/L (ref 0–44)
AST: 17 U/L (ref 15–41)
Albumin: 2.7 g/dL — ABNORMAL LOW (ref 3.5–5.0)
Alkaline Phosphatase: 71 U/L (ref 38–126)
Anion gap: 11 (ref 5–15)
BUN: 23 mg/dL (ref 8–23)
CO2: 21 mmol/L — ABNORMAL LOW (ref 22–32)
Calcium: 8.9 mg/dL (ref 8.9–10.3)
Chloride: 105 mmol/L (ref 98–111)
Creatinine, Ser: 1.19 mg/dL — ABNORMAL HIGH (ref 0.44–1.00)
GFR, Estimated: 49 mL/min — ABNORMAL LOW (ref >60)
Glucose, Bld: 212 mg/dL — ABNORMAL HIGH (ref 70–99)
Potassium: 3.3 mmol/L — ABNORMAL LOW (ref 3.5–5.1)
Sodium: 137 mmol/L (ref 135–145)
Total Bilirubin: 0.8 mg/dL (ref 0.3–1.2)
Total Protein: 6.3 g/dL — ABNORMAL LOW (ref 6.5–8.1)

## 2020-06-30 LAB — MAGNESIUM: Magnesium: 2 mg/dL (ref 1.7–2.4)

## 2020-06-30 LAB — GLUCOSE, CAPILLARY
Glucose-Capillary: 112 mg/dL — ABNORMAL HIGH (ref 70–99)
Glucose-Capillary: 206 mg/dL — ABNORMAL HIGH (ref 70–99)
Glucose-Capillary: 181 mg/dL — ABNORMAL HIGH (ref 70–99)
Glucose-Capillary: 197 mg/dL — ABNORMAL HIGH (ref 70–99)

## 2020-06-30 LAB — PROCALCITONIN: Procalcitonin: 9.73 ng/mL

## 2020-06-30 LAB — TROPONIN I (HIGH SENSITIVITY): Troponin I (High Sensitivity): 375 ng/L (ref <18)

## 2020-06-30 LAB — BRAIN NATRIURETIC PEPTIDE: B Natriuretic Peptide: 2548.4 pg/mL — ABNORMAL HIGH (ref 0.0–100.0)

## 2020-06-30 MED ORDER — FUROSEMIDE 10 MG/ML IJ SOLN
20.0000 mg | Freq: Once | INTRAMUSCULAR | Status: AC
  Administered 2020-06-30: 20 mg via INTRAVENOUS
  Filled 2020-06-30: qty 2

## 2020-06-30 MED ORDER — SODIUM CHLORIDE 0.9% FLUSH
3.0000 mL | Freq: Two times a day (BID) | INTRAVENOUS | Status: DC
  Administered 2020-06-30: 3 mL via INTRAVENOUS

## 2020-06-30 MED ORDER — POTASSIUM CHLORIDE CRYS ER 20 MEQ PO TBCR
40.0000 meq | EXTENDED_RELEASE_TABLET | Freq: Once | ORAL | Status: AC
  Administered 2020-06-30: 40 meq via ORAL
  Filled 2020-06-30: qty 2

## 2020-06-30 NOTE — TOC Initial Note (Signed)
Transition of Care [facility]) - Initial/Assessment Note    Patient Details  Name: Anna [name] MRN: [id number] Date of Birth: 1950-08-04  [facility]) CM/SW Contact:    Carles Collet, RN Phone Number: 06/30/2020, 4:05 PM  Clinical Narrative:                Spoke to patient re recommendations for [facility] PT. She would like referral sent to [facility], completed.     Expected Discharge Plan: Home/Self Care Barriers to Discharge: Continued Medical Work up   Patient Goals and CMS Choice Patient states their goals for this hospitalization and ongoing recovery are:: to go home      Expected Discharge Plan and Services Expected Discharge Plan: Home/Self Care                                              Prior Living Arrangements/Services                       Activities of Daily Living Home Assistive Devices/Equipment: Environmental consultant (specify type) ADL Screening (condition at time of admission) Patient's cognitive ability adequate to safely complete daily activities?: Yes Is the patient deaf or have difficulty hearing?: No Does the patient have difficulty seeing, even when wearing glasses/contacts?: No Does the patient have difficulty concentrating, remembering, or making decisions?: No Patient able to express need for assistance with ADLs?: Yes Does the patient have difficulty dressing or bathing?: No Independently performs ADLs?: Yes (appropriate for developmental age) Does the patient have difficulty walking or climbing stairs?: Yes Weakness of Legs: Both Weakness of Arms/Hands: None  Permission Sought/Granted                  Emotional Assessment              Admission diagnosis:  SOB (shortness of breath) [R06.02] Severe sepsis ([place]) [A41.9, R65.20] Sepsis, due to unspecified organism, unspecified whether acute organ dysfunction present [facility]) [A41.9] Patient Active Problem List   Diagnosis Date Noted  . Severe sepsis ([place]) 06/28/2020  .  Acute lower UTI 06/28/2020  . SOB (shortness of breath) 06/28/2020  . Acute diastolic CHF (congestive heart failure) ([place]) 06/28/2020  . Acute gastritis 06/09/2020  . Acute renal failure syndrome ([place]) 06/09/2020  . Disorder of brain 06/09/2020  . Hypokalemia 06/09/2020  . Hypomagnesemia 06/09/2020  . Metabolic acidosis 45/80/9983  . Pancytopenia ([place]) 06/09/2020  . Lumbar post-laminectomy syndrome 05/04/2020  . E. coli sepsis ([place]) 01/12/2020  . Pyelonephritis 01/11/2020  . Leukocytosis 01/11/2020  . Diarrhea 01/11/2020  . Excessive urination at night 01/11/2020  . Hyperglycemia 01/11/2020  . S/P lumbar laminectomy 12/10/2019  . Herniated lumbar intervertebral disc 12/02/2019  . Body mass index (BMI) 27.0-27.9, adult 11/04/2019  . 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 Hypo-osmolality and hyponatremia 01/22/2019  . Sepsis secondary to UTI ([place]) 01/09/2019  . Essential hypertension 01/09/2019  . Hyponatremia 01/09/2019  . Fall at home, initial encounter 01/09/2019  . AKI (acute kidney injury) ([place]) 01/09/2019  . Chronic back pain 01/09/2019  . Trochanteric bursitis 10/10/2018  . Excessive weight gain 01/17/2018  . Nocturia more than twice per night 01/17/2018  . Type 2 diabetes mellitus with hyperglycemia ([place]) 01/17/2018  . Myalgia 01/17/2018  . Psoriasis 01/17/2018  . Insomnia 11/13/2017  . Spondylosis without myelopathy or radiculopathy, lumbar region  05/25/2016  . Dysuria 11/21/2012  . Ganglion cyst 03/27/2012  . Hyperlipidemia 03/27/2012  . Nevus, non-neoplastic 10/11/2011  . Pain in right ankle and joints of right foot 08/21/2011  . Varicose veins of lower extremities with other complications 41/28/7867   PCP:  Leanna Battles, MD Pharmacy:   [place] ([place]), [place] - [address] [place] ([place]) [place] [address] Phone: [phone] Fax: [phone]  [place]. [place] [address] Phone: [phone] Fax: [phone]     Social Determinants of Health (SDOH) Interventions    Readmission Risk Interventions No flowsheet data found.

## 2020-06-30 NOTE — Progress Notes (Signed)
PROGRESS NOTE                                                                                                                                                                                                             Patient Demographics:    Anna [name], is a 70 y.o. female, DOB - Mar 26, 1950, PYK:[id number]  Outpatient Primary MD for the patient is Anna Battles, MD    LOS - 2  Admit date - 06/28/2020    Chief Complaint  Patient presents with  . Shortness of Breath  . Weakness  . Tachycardia  . Fever       Brief Narrative (HPI from H&P) - Anna [name] is a 70 y.o. female with medical history significant for recurrent urinary tract infections, restless leg syndrome, hypertension, type 2 diabetes mellitus, stage III chronic kidney disease with baseline creatinine 1.1-1.4, presented to [facility] with high fever was diagnosed with severe sepsis due to UTI and admitted to the hospital.   Subjective:   Patient in bed, appears comfortable, denies any headache, no fever, no chest pain or pressure,had developed orthopnea and shortness of breath , no abdominal pain. No new focal weakness.    Assessment  & Plan :     1.  Severe sepsis due to current UTI.  High procalcitonin suggestive of possible bacteremia, has been placed on cefepime which will be continued, monitor cultures and procalcitonin, hydrate with IV appears nontoxic sepsis pathophysiology has likely resolved.  Will check renal ultrasound with outpatient urology follow-up post discharge.  2.  Acute on chronic nonspecific CHF exacerbation night of 06/29/2020.  Echo pending no previous study on chart, responded well to IV Lasix, required BiPAP night of 06/29/2020, better with diuresis, cardiology to follow soon.  3.  Non-ACS pattern elevation of troponin with sinus tachycardia.  Likely caused by sepsis creating demand mismatch, placed on aspirin,  beta-blocker and statin, chest pain-free, check echocardiogram to evaluate EF and wall motion, EKG Non sp changes, Hep gtt started upon admission, continue for 48hrs total.  CTA stable, once she has recovered from sepsis and UTI cardiology to reevaluate.  4.  Hypertension.  Placed on beta-blocker.  5.  Hypomagnesemia and hypokalemia.  Replaced.  6.  Dyslipidemia.  On statin.  7.  CKD 3A.  Baseline creatinine close to 1.4, had mild AKI  resolved with hydration.  Monitor.  Hold nephrotoxins.  8.  Ongoing smoking.  Counseled to quit.       Condition - Extremely Guarded  Family Communication  : Husband Anna [name] [phone] on 06/30/2018 message left @ 9.54 am, updated bedside 06/30/2020  Code Status :  Full  Consults  :  Cards  PUD Prophylaxis : PPI   Procedures  :     CTA - No PE  TTE  Renal US      Disposition Plan  :    Status is: Inpatient  Remains inpatient appropriate because:IV treatments appropriate due to intensity of illness or inability to take PO   Dispo: The patient is from: Home              Anticipated d/c is to: Home              Patient currently is not medically stable to d/c.   Difficult to place patient No   DVT Prophylaxis  :    Heparin   Lab Results  Component Value Date   PLT 305 06/29/2020    Diet :  Diet Order            DIET SOFT Room service appropriate? Yes; Fluid consistency: Thin  Diet effective now                  Inpatient Medications  Scheduled Meds: . aspirin EC  81 mg Oral Daily  . atorvastatin  40 mg Oral Daily  . furosemide  20 mg Intravenous Once  . insulin aspart  0-9 Units Subcutaneous TID WC  . levalbuterol  0.63 mg Nebulization Once  . metoprolol tartrate  25 mg Oral BID  . potassium chloride  40 mEq Oral Once  . pregabalin  50 mg Oral BID   Continuous Infusions: . ceFEPime (MAXIPIME) IV 2 g (06/30/20 0728)  . heparin 1,200 Units/hr (06/30/20 0144)   PRN Meds:.acetaminophen **OR** [DISCONTINUED]  acetaminophen, melatonin, metoprolol tartrate  Antibiotics  :    Anti-infectives (From admission, onward)   Start     Dose/Rate Route Frequency Ordered Stop   06/30/20 1930  vancomycin (VANCOREADY) IVPB 1500 mg/300 mL  Status:  Discontinued        1,500 mg 150 mL/hr over 120 Minutes Intravenous Every 48 hours 06/28/20 2004 06/28/20 2125   06/29/20 0700  ceFEPIme (MAXIPIME) 2 g in sodium chloride 0.9 % 100 mL IVPB        2 g 200 mL/hr over 30 Minutes Intravenous Every 12 hours 06/28/20 2004     06/28/20 1830  ceFEPIme (MAXIPIME) 2 g in sodium chloride 0.9 % 100 mL IVPB        2 g 200 mL/hr over 30 Minutes Intravenous  Once 06/28/20 1815 06/28/20 1918   06/28/20 1830  metroNIDAZOLE (FLAGYL) IVPB 500 mg        500 mg 100 mL/hr over 60 Minutes Intravenous  Once 06/28/20 1815 06/28/20 1944   06/28/20 1830  vancomycin (VANCOREADY) IVPB 1500 mg/300 mL        1,500 mg 150 mL/hr over 120 Minutes Intravenous  Once 06/28/20 1815 06/28/20 2123       Time Spent in minutes  30   Lala Lund M.D on 06/30/2020 at 8:26 AM  To page go to [email or web address]   [facility] -  Office  [phone]     See all Orders from today for further details    Objective:   Vitals:   06/30/20  0100 06/30/20 0400 06/30/20 0422 06/30/20 0739  BP: 126/75 (!) 138/92  116/73  Pulse: 97 92  94  Resp: 20 18  17   Temp: 98.2 F (36.8 C) 98.1 F (36.7 C)  97.6 F (36.4 C)  TempSrc: Axillary Axillary  Axillary  SpO2: 100% 100%  100%  Weight:   78.5 kg   Height:        Wt Readings from Last 3 Encounters:  06/30/20 78.5 kg  03/02/20 69.5 kg  01/11/20 72.1 kg     Intake/Output Summary (Last 24 hours) at 06/30/2020 0826 Last data filed at 06/30/2020 0511 Gross per 24 hour  Intake 1981.84 ml  Output 1850 ml  Net 131.84 ml     Physical Exam  Awake Alert, No new F.N deficits, Normal affect [place].AT,PERRAL Supple Neck,No JVD, No cervical lymphadenopathy appriciated.  Symmetrical Chest wall  movement, Good air movement bilaterally, +ve rales RRR,No Gallops, Rubs or new Murmurs, No Parasternal Heave +ve B.Sounds, Abd Soft, No tenderness, No organomegaly appriciated, No rebound - guarding or rigidity. No Cyanosis, Clubbing or edema, No new Rash or bruise     Data Review:    CBC Recent Labs  Lab 06/28/20 1620 06/29/20 0246 06/29/20 2305  WBC 15.1* 15.8* 15.5*  HGB 10.5* 10.3* 9.6*  HCT 32.5* 32.6* 29.2*  PLT 274 246 305  MCV 101.2* 100.6* 99.0  MCH 32.7 31.8 32.5  MCHC 32.3 31.6 32.9  RDW 18.2* 18.5* 18.4*  LYMPHSABS 0.7 1.3 1.2  MONOABS 0.6 1.1* 1.1*  EOSABS 0.0 0.0 0.0  BASOSABS 0.1 0.1 0.1    Recent Labs  Lab 06/28/20 1620 06/28/20 2001 06/28/20 2315 06/29/20 0246 06/29/20 2305  NA 134*  --   --  136 137  K 4.1  --   --  3.8 3.3*  CL 105  --   --  105 105  CO2 19*  --   --  20* 21*  GLUCOSE 310*  --   --  220* 212*  BUN 24*  --   --  22 23  CREATININE 1.20*  --   --  1.23* 1.19*  CALCIUM 8.6*  --   --  8.7* 8.9  AST 18  --   --  17 17  ALT 13  --   --  15 16  ALKPHOS 78  --   --  71 71  BILITOT 1.1  --   --  0.8 0.8  ALBUMIN 2.8*  --   --  2.7* 2.7*  MG  --   --  1.4* 1.5* 2.0  PROCALCITON  --   --  6.95  --   --   LATICACIDVEN 3.3* 2.5*  --  1.6  --   INR 1.1  --   --   --   --   TSH  --   --   --  1.099  --   HGBA1C  --   --   --  7.5*  --   BNP  --   --   --   --  0,938.1*    ------------------------------------------------------------------------------------------------------------------ No results for input(s): CHOL, HDL, LDLCALC, TRIG, CHOLHDL, LDLDIRECT in the last 72 hours.  Lab Results  Component Value Date   HGBA1C 7.5 (H) 06/29/2020   ------------------------------------------------------------------------------------------------------------------ Recent Labs    06/29/20 0246  TSH 1.099    Cardiac Enzymes No results for input(s): CKMB, TROPONINI, MYOGLOBIN in the last 168 hours.  Invalid input(s):  CK ------------------------------------------------------------------------------------------------------------------    Component Value  Date/Time   BNP 2,548.4 (H) 06/29/2020 2305    Micro Results Recent Results (from the past 240 hour(s))  Resp Panel by RT-PCR (Flu A&B, Covid) Nasopharyngeal Swab     Status: None   Collection Time: 06/28/20  4:20 PM   Specimen: Nasopharyngeal Swab; Nasopharyngeal(NP) swabs in vial transport medium  Result Value Ref Range Status   SARS Coronavirus 2 by RT PCR NEGATIVE NEGATIVE Final    Comment: (NOTE) SARS-CoV-2 target nucleic acids are NOT DETECTED.  The SARS-CoV-2 RNA is generally detectable in upper respiratory specimens during the acute phase of infection. The lowest concentration of SARS-CoV-2 viral copies this assay can detect is 138 copies/mL. A negative result does not preclude SARS-Cov-2 infection and should not be used as the sole basis for treatment or other patient management decisions. A negative result may occur with  improper specimen collection/handling, submission of specimen other than nasopharyngeal swab, presence of viral mutation(s) within the areas targeted by this assay, and inadequate number of viral copies(<138 copies/mL). A negative result must be combined with clinical observations, patient history, and epidemiological information. The expected result is Negative.  Fact Sheet for Patients:  EntrepreneurPulse.com.au  Fact Sheet for Healthcare Providers:  IncredibleEmployment.be  This test is no t yet approved or cleared by the [place] FDA and  has been authorized for detection and/or diagnosis of SARS-CoV-2 by FDA under an Emergency Use Authorization (EUA). This EUA will remain  in effect (meaning this test can be used) for the duration of the COVID-19 declaration under Section 564(b)(1) of the Act, 21 U.S.C.section 360bbb-3(b)(1), unless the authorization is terminated  or  revoked sooner.       Influenza A by PCR NEGATIVE NEGATIVE Final   Influenza B by PCR NEGATIVE NEGATIVE Final    Comment: (NOTE) The Xpert Xpress SARS-CoV-2/FLU/RSV plus assay is intended as an aid in the diagnosis of influenza from Nasopharyngeal swab specimens and should not be used as a sole basis for treatment. Nasal washings and aspirates are unacceptable for Xpert Xpress SARS-CoV-2/FLU/RSV testing.  Fact Sheet for Patients: EntrepreneurPulse.com.au  Fact Sheet for Healthcare Providers: IncredibleEmployment.be  This test is not yet approved or cleared by the [place] FDA and has been authorized for detection and/or diagnosis of SARS-CoV-2 by FDA under an Emergency Use Authorization (EUA). This EUA will remain in effect (meaning this test can be used) for the duration of the COVID-19 declaration under Section 564(b)(1) of the Act, 21 U.S.C. section 360bbb-3(b)(1), unless the authorization is terminated or revoked.  Performed at [facility], [place] [address], [place], [place] [address]   Blood culture (routine single)     Status: None (Preliminary result)   Collection Time: 06/28/20  4:23 PM   Specimen: BLOOD  Result Value Ref Range Status   Specimen Description BLOOD SITE NOT SPECIFIED  Final   Special Requests   Final    BOTTLES DRAWN AEROBIC AND ANAEROBIC Blood Culture results may not be optimal due to an inadequate volume of blood received in culture bottles   Culture   Final    NO GROWTH < 12 HOURS Performed at [facility], [place] [address], [place], [place] [address]    Report Status PENDING  Incomplete  Urine culture     Status: None   Collection Time: 06/28/20  8:50 PM   Specimen: In/Out Cath Urine  Result Value Ref Range Status   Specimen Description IN/OUT CATH URINE  Final   Special Requests NONE  Final  Culture   Final    NO GROWTH Performed at [facility], [place] [address] [place], [place]  [address]    Report Status 06/30/2020 FINAL  Final  MRSA PCR Screening     Status: None   Collection Time: 06/29/20  6:58 AM   Specimen: Nasal Mucosa; Nasopharyngeal  Result Value Ref Range Status   MRSA by PCR NEGATIVE NEGATIVE Final    Comment:        The GeneXpert MRSA Assay (FDA approved for NASAL specimens only), is one component of a comprehensive MRSA colonization surveillance program. It is not intended to diagnose MRSA infection nor to guide or monitor treatment for MRSA infections. Performed at [facility], [place] [address], [place], [place] [address]     Radiology Reports  CT Angio Chest PE W/Cm &/Or Wo Cm  Result Date: 06/28/2020 CLINICAL DATA:  PE suspected, high prob Shortness of breath.  Weakness.  Tachycardia. EXAM: CT ANGIOGRAPHY CHEST WITH CONTRAST TECHNIQUE: Multidetector CT imaging of the chest was performed using the standard protocol during bolus administration of intravenous contrast. Multiplanar CT image reconstructions and MIPs were obtained to evaluate the vascular anatomy. CONTRAST:  33mL OMNIPAQUE IOHEXOL 350 MG/ML SOLN COMPARISON:  Radiograph earlier this day.  Chest CTA 01/14/2020 FINDINGS: Cardiovascular: There are no filling defects within the pulmonary arteries to suggest pulmonary embolus. Aortic atherosclerosis. Conventional branching pattern from the aortic arch. No evidence of dissection or acute aortic findings. Normal heart size. Small amount of pericardial fluid anteriorly is similar to prior exam. Coronary artery calcifications. Mediastinum/Nodes: No enlarged mediastinal or hilar lymph nodes. No thyroid nodule. No esophageal wall thickening. Lungs/Pleura: Linear right upper lobe scarring is unchanged from prior exam. Previous pleural effusions have resolved. Previous septal thickening has resolved. No acute airspace disease. Trachea and central bronchi are patent. Mild central bronchial thickening. Upper Abdomen: No acute findings. Vascular  calcifications. Suspected hepatic steatosis. Musculoskeletal: There are no acute or suspicious osseous abnormalities. Review of the MIP images confirms the above findings. IMPRESSION: 1. No pulmonary embolus. 2. Mild central bronchial thickening, can be seen with bronchitis or reactive airways disease. 3. Right upper lobe scarring. 4. Aortic atherosclerosis.  Coronary artery calcifications. Aortic Atherosclerosis (ICD10-I70.0). Electronically Signed   By: Keith Rake M.D.   On: 06/28/2020 19:45     DG Chest Port 1 View  Result Date: 06/28/2020 CLINICAL DATA:  Fever and shortness of breath. EXAM: PORTABLE CHEST 1 VIEW COMPARISON:  June 28, 2020 (4:44 p.m.) FINDINGS: Decreased lung volumes are again seen with mild diffusely increased interstitial lung markings. This represents a new finding when compared to the prior study. Mild areas of atelectasis and/or infiltrate are seen within the bilateral infrahilar regions and medial aspect of the upper left lung. There is no evidence of a pleural effusion or pneumothorax. The heart size and mediastinal contours are within normal limits. The visualized skeletal structures are unremarkable. IMPRESSION: 1. Interval development of mild left upper lobe and bilateral infrahilar atelectasis and/or infiltrate since the prior study. 2. Additional findings consistent with mild interstitial edema. Electronically Signed   By: Virgina Norfolk M.D.   On: 06/28/2020 22:29   DG Chest Port 1 View  Result Date: 06/28/2020 CLINICAL DATA:  Questionable sepsis, fever, shortness of breath, weakness, tachycardia EXAM: PORTABLE CHEST 1 VIEW COMPARISON:  01/13/2020 FINDINGS: Cardiomegaly. Low volume AP portable examination without acute airspace opacity. The visualized skeletal structures are unremarkable. IMPRESSION: Cardiomegaly without acute abnormality of the lungs in AP portable  projection. Electronically Signed   By: Eddie Candle M.D.   On: 06/28/2020 16:56

## 2020-06-30 NOTE — Progress Notes (Addendum)
Progress Note  Patient Name: Anna [name] Date of Encounter: 06/30/2020  [facility] Cardiologist: Werner Lean, MD   Subjective   Patient is sitting in the chair, no acute distress, able to carry conversation comfortably. She states she had intermittent SOB since yesterday. She need oxygen support last night and is now weaned off nasal cannula. She states she had some wheezing with the SOB episodes. She noted her legs were swelling, reports improved swelling with lasix. She denied any chest pain, chest pressure, heart palpitation, dizziness, syncope, orthopnea, PNA. She states SOB occurred while she is hospitalized here and no similar symptoms at home. She does endorse overall weakness and limited capacity to walking up stairs since her back surgery with continued back pain.   Inpatient Medications    Scheduled Meds:  aspirin EC  81 mg Oral Daily   atorvastatin  40 mg Oral Daily   furosemide  20 mg Intravenous Once   insulin aspart  0-9 Units Subcutaneous TID WC   levalbuterol  0.63 mg Nebulization Once   metoprolol tartrate  25 mg Oral BID   potassium chloride  40 mEq Oral Once   pregabalin  50 mg Oral BID   sodium chloride flush  3 mL Intravenous Q12H   Continuous Infusions:  ceFEPime (MAXIPIME) IV 2 g (06/30/20 0728)   heparin 1,200 Units/hr (06/30/20 0144)   PRN Meds: acetaminophen **OR** [DISCONTINUED] acetaminophen, melatonin, metoprolol tartrate   Vital Signs    Vitals:   06/30/20 0422 06/30/20 0739 06/30/20 0815 06/30/20 0912  BP:  116/73    Pulse:  94    Resp:  17    Temp:  97.6 F (36.4 C)    TempSrc:  Axillary    SpO2:  100% 99% 100%  Weight: 78.5 kg     Height:        Intake/Output Summary (Last 24 hours) at 06/30/2020 1118 Last data filed at 06/30/2020 0825 Gross per 24 hour  Intake 1981.84 ml  Output 1850 ml  Net 131.84 ml   Last 3 Weights 06/30/2020 06/29/2020 06/29/2020  Weight (lbs) 173 lb 1 oz 157 lb 13.6 oz 157 lb 13.6 oz  Weight  (kg) 78.5 kg 71.6 kg 71.6 kg      Telemetry    Sinus rhythm /sinus tachycardia with ventricular rate of 90-140s, artifacts. - Personally Reviewed  ECG    EKG showed sinus tachycardia with rate of 129s with associated TWI on lateral leads, appears changed from EKG on 03/14/20- Personally Reviewed  Physical Exam   GEN: No acute distress.   Neck: No JVD Cardiac: RRR, no murmurs, rubs, or gallops. No carotid bruit. Respiratory: Lung sound decreased at base to auscultation bilaterally. On room air.  GI: Abdomen obese. Soft, nontender, non-distended  MS: BLE 1- edema. Varicose veins noted of BLE. No deformity.  Neuro:  Alert and oriented x3. Follow commands appropriately. No focal neuro deficit.  Psych: Mild anxiety.   Labs    High Sensitivity Troponin:   Recent Labs  Lab 06/28/20 2315 06/29/20 0246 06/29/20 2305  TROPONINIHS 1,034* 932* 375*      Chemistry Recent Labs  Lab 06/28/20 1620 06/29/20 0246 06/29/20 2305  NA 134* 136 137  K 4.1 3.8 3.3*  CL 105 105 105  CO2 19* 20* 21*  GLUCOSE 310* 220* 212*  BUN 24* 22 23  CREATININE 1.20* 1.23* 1.19*  CALCIUM 8.6* 8.7* 8.9  PROT 6.1* 5.9* 6.3*  ALBUMIN 2.8* 2.7* 2.7*  AST 18  17 17  ALT 13 15 16   ALKPHOS 78 71 71  BILITOT 1.1 0.8 0.8  GFRNONAA 48* 47* 49*  ANIONGAP 10 11 11      Hematology Recent Labs  Lab 06/28/20 1620 06/29/20 0246 06/29/20 2305  WBC 15.1* 15.8* 15.5*  RBC 3.21* 3.24* 2.95*  HGB 10.5* 10.3* 9.6*  HCT 32.5* 32.6* 29.2*  MCV 101.2* 100.6* 99.0  MCH 32.7 31.8 32.5  MCHC 32.3 31.6 32.9  RDW 18.2* 18.5* 18.4*  PLT 274 246 305    BNP Recent Labs  Lab 06/29/20 2305  BNP 2,548.4*     DDimer No results for input(s): DDIMER in the last 168 hours.   Radiology    CT Angio Chest PE W/Cm &/Or Wo Cm  Result Date: 06/28/2020 CLINICAL DATA:  PE suspected, high prob Shortness of breath.  Weakness.  Tachycardia. EXAM: CT ANGIOGRAPHY CHEST WITH CONTRAST TECHNIQUE: Multidetector CT imaging of  the chest was performed using the standard protocol during bolus administration of intravenous contrast. Multiplanar CT image reconstructions and MIPs were obtained to evaluate the vascular anatomy. CONTRAST:  76mL OMNIPAQUE IOHEXOL 350 MG/ML SOLN COMPARISON:  Radiograph earlier this day.  Chest CTA 01/14/2020 FINDINGS: Cardiovascular: There are no filling defects within the pulmonary arteries to suggest pulmonary embolus. Aortic atherosclerosis. Conventional branching pattern from the aortic arch. No evidence of dissection or acute aortic findings. Normal heart size. Small amount of pericardial fluid anteriorly is similar to prior exam. Coronary artery calcifications. Mediastinum/Nodes: No enlarged mediastinal or hilar lymph nodes. No thyroid nodule. No esophageal wall thickening. Lungs/Pleura: Linear right upper lobe scarring is unchanged from prior exam. Previous pleural effusions have resolved. Previous septal thickening has resolved. No acute airspace disease. Trachea and central bronchi are patent. Mild central bronchial thickening. Upper Abdomen: No acute findings. Vascular calcifications. Suspected hepatic steatosis. Musculoskeletal: There are no acute or suspicious osseous abnormalities. Review of the MIP images confirms the above findings. IMPRESSION: 1. No pulmonary embolus. 2. Mild central bronchial thickening, can be seen with bronchitis or reactive airways disease. 3. Right upper lobe scarring. 4. Aortic atherosclerosis.  Coronary artery calcifications. Aortic Atherosclerosis (ICD10-I70.0). Electronically Signed   By: Keith Rake M.D.   On: 06/28/2020 19:45   US RENAL  Result Date: 06/29/2020 CLINICAL DATA:  Acute kidney injury EXAM: RENAL / URINARY TRACT ULTRASOUND COMPLETE COMPARISON:  Ultrasound 01/09/2019 FINDINGS: Right Kidney: Renal measurements: 9.4 x 4.2 x 3.9 cm = volume: 81.5 mL. Cortical echogenicity is within normal limits. No mass or hydronephrosis. Possible areas of cortical  thinning and scarring. Left Kidney: Renal measurements: 10.5 x 4.9 x 4 cm = volume: 107.1 mL. Cortical echogenicity within normal limits possible cortical thinning at the upper pole. Bladder: Appears normal for degree of bladder distention. Other: None. IMPRESSION: 1. Negative for hydronephrosis. 2. Probable cortical thinning upper pole left kidney and possible cortical thinning scarring in the right kidney Electronically Signed   By: Donavan Foil M.D.   On: 06/29/2020 19:43   DG Chest Port 1 View  Result Date: 06/30/2020 CLINICAL DATA:  Shortness of breath EXAM: PORTABLE CHEST 1 VIEW COMPARISON:  06/29/2020 FINDINGS: Cardiac shadow is stable. Significant improved aeration is noted bilaterally with the exception of some mild right basilar atelectasis. No new focal abnormality is seen. IMPRESSION: Mild right basilar atelectasis. Otherwise significant improved aeration bilaterally Electronically Signed   By: Inez Catalina M.D.   On: 06/30/2020 08:55   DG CHEST PORT 1 VIEW  Result Date: 06/29/2020 CLINICAL DATA:  Dyspnea. EXAM:  PORTABLE CHEST 1 VIEW COMPARISON:  Radiographs and CT earlier today. FINDINGS: Stable heart size and mediastinal contours. Again seen diffuse bronchial thickening. No acute or progressive airspace disease. Linear scarring in the left mid lung. No pleural effusion or pneumothorax. Stable osseous structures. IMPRESSION: Diffuse bronchial thickening, unchanged from CT earlier this day. No acute or progressive airspace disease. Electronically Signed   By: Keith Rake M.D.   On: 06/29/2020 21:17   DG Chest Port 1 View  Result Date: 06/28/2020 CLINICAL DATA:  Fever and shortness of breath. EXAM: PORTABLE CHEST 1 VIEW COMPARISON:  June 28, 2020 (4:44 p.m.) FINDINGS: Decreased lung volumes are again seen with mild diffusely increased interstitial lung markings. This represents a new finding when compared to the prior study. Mild areas of atelectasis and/or infiltrate are seen within  the bilateral infrahilar regions and medial aspect of the upper left lung. There is no evidence of a pleural effusion or pneumothorax. The heart size and mediastinal contours are within normal limits. The visualized skeletal structures are unremarkable. IMPRESSION: 1. Interval development of mild left upper lobe and bilateral infrahilar atelectasis and/or infiltrate since the prior study. 2. Additional findings consistent with mild interstitial edema. Electronically Signed   By: Virgina Norfolk M.D.   On: 06/28/2020 22:29   DG Chest Port 1 View  Result Date: 06/28/2020 CLINICAL DATA:  Questionable sepsis, fever, shortness of breath, weakness, tachycardia EXAM: PORTABLE CHEST 1 VIEW COMPARISON:  01/13/2020 FINDINGS: Cardiomegaly. Low volume AP portable examination without acute airspace opacity. The visualized skeletal structures are unremarkable. IMPRESSION: Cardiomegaly without acute abnormality of the lungs in AP portable projection. Electronically Signed   By: Eddie Candle M.D.   On: 06/28/2020 16:56   ECHOCARDIOGRAM COMPLETE  Result Date: 06/30/2020    ECHOCARDIOGRAM REPORT   Patient Name:   LAVANDA NEVELS Date of Exam: 06/29/2020 Medical Rec #:  [id number]      Height:       64.0 in Accession #:    [id number]     Weight:       157.8 lb Date of Birth:  Jul 29, 1950      BSA:          1.769 m Patient Age:    70 years       BP:           110/75 mmHg Patient Gender: F              HR:           115 bpm. Exam Location:  Inpatient Procedure: 2D Echo, Cardiac Doppler and Color Doppler Indications:    R01.1 Murmur  History:        Patient has no prior history of Echocardiogram examinations.                 Signs/Symptoms:Shortness of Breath.  Sonographer:    [name] RDCS Referring Phys: [id number] Margaree Mackintosh Fabens  1. Left ventricular ejection fraction, by estimation, is 35 to 40%. Left ventricular ejection fraction by 3D volume is 37 %. The left ventricle has moderately decreased function. The left  ventricle demonstrates regional wall motion abnormalities (see scoring diagram/findings for description) in the mid and apical distribution. This can be seen in stress mediated cardiomyopathy. No LV thrombus visualized. There is mild concentric left ventricular hypertrophy. Indeterminate diastolic filling due to E-A  fusion.  2. Right ventricular systolic function is normal. The right ventricular size is normal.  3. A small pericardial effusion is present.  4. The mitral valve is grossly normal. Trivial mitral valve regurgitation.  5. The aortic valve is tricuspid. There is mild calcification of the aortic valve. Aortic valve regurgitation is not visualized. Mild aortic valve sclerosis is present, with no evidence of aortic valve stenosis.  6. The inferior vena cava is normal in size with greater than 50% respiratory variability, suggesting right atrial pressure of 3 mmHg. Comparison(s): No prior Echocardiogram. FINDINGS  Left Ventricle: Left ventricular ejection fraction, by estimation, is 35 to 40%. Left ventricular ejection fraction by 3D volume is 37 %. The left ventricle has moderately decreased function. The left ventricle demonstrates regional wall motion abnormalities. The left ventricular internal cavity size was normal in size. There is mild concentric left ventricular hypertrophy. Indeterminate diastolic filling due to E-A fusion.  LV Wall Scoring: The mid and distal lateral wall, mid and distal anterior septum, entire apex, mid anterolateral segment, and mid inferoseptal segment are hypokinetic. Right Ventricle: The right ventricular size is normal. No increase in right ventricular wall thickness. Right ventricular systolic function is normal. Left Atrium: Left atrial size was normal in size. Right Atrium: Right atrial size was normal in size. Pericardium: A small pericardial effusion is present. Mitral Valve: The mitral valve is grossly normal. Trivial mitral valve regurgitation. Tricuspid Valve: The  tricuspid valve is normal in structure. Tricuspid valve regurgitation is not demonstrated. No evidence of tricuspid stenosis. Aortic Valve: The aortic valve is tricuspid. There is mild calcification of the aortic valve. Aortic valve regurgitation is not visualized. Mild aortic valve sclerosis is present, with no evidence of aortic valve stenosis. Aortic valve mean gradient measures 6.0 mmHg. Aortic valve peak gradient measures 10.8 mmHg. Aortic valve area, by VTI measures 1.13 cm. Pulmonic Valve: The pulmonic valve was grossly normal. Pulmonic valve regurgitation is not visualized. No evidence of pulmonic stenosis. Aorta: The aortic root is normal in size and structure. Venous: The inferior vena cava is normal in size with greater than 50% respiratory variability, suggesting right atrial pressure of 3 mmHg. IAS/Shunts: The atrial septum is grossly normal.  LEFT VENTRICLE PLAX 2D LVIDd:         5.00 cm LVIDs:         3.70 cm LV PW:         1.10 cm         3D Volume EF LV IVS:        1.10 cm         LV 3D EF:    Left LVOT diam:     1.80 cm                      ventricular LV SV:         28                           ejection LV SV Index:   16                           fraction by LVOT Area:     2.54 cm                     3D volume  is 37 %.  LV Volumes (MOD) LV vol d, MOD    77.8 ml       3D Volume EF: A2C:                           3D EF:        37 % LV vol d, MOD    63.9 ml       LV EDV:       146 ml A4C:                           LV ESV:       91 ml LV vol s, MOD    52.6 ml       LV SV:        54 ml A2C: LV vol s, MOD    50.2 ml A4C: LV SV MOD A2C:   25.2 ml LV SV MOD A4C:   63.9 ml LV SV MOD BP:    22.8 ml RIGHT VENTRICLE             IVC RV Basal diam:  2.90 cm     IVC diam: 1.80 cm RV S prime:     11.60 cm/s TAPSE (M-mode): 1.6 cm LEFT ATRIUM             Index       RIGHT ATRIUM           Index LA diam:        4.20 cm 2.37 cm/m  RA Area:     10.70 cm LA Vol (A2C):    46.6 ml 26.34 ml/m RA Volume:   24.10 ml  13.62 ml/m LA Vol (A4C):   41.2 ml 23.29 ml/m LA Biplane Vol: 46.8 ml 26.45 ml/m  AORTIC VALVE AV Area (Vmax):    1.20 cm AV Area (Vmean):   1.17 cm AV Area (VTI):     1.13 cm AV Vmax:           164.00 cm/s AV Vmean:          115.000 cm/s AV VTI:            0.246 m AV Peak Grad:      10.8 mmHg AV Mean Grad:      6.0 mmHg LVOT Vmax:         77.10 cm/s LVOT Vmean:        53.100 cm/s LVOT VTI:          0.109 m LVOT/AV VTI ratio: 0.44  AORTA Ao Root diam: 2.60 cm  SHUNTS Systemic VTI:  0.11 m Systemic Diam: 1.80 cm Rudean Haskell MD Electronically signed by Rudean Haskell MD Signature Date/Time: 06/30/2020/9:19:12 AM    Final     Cardiac Studies   Echo from 06/29/20:   1. Left ventricular ejection fraction, by estimation, is 35 to 40%. Left  ventricular ejection fraction by 3D volume is 37 %. The left ventricle has  moderately decreased function. The left ventricle demonstrates regional  wall motion abnormalities (see  scoring diagram/findings for description) in the mid and apical  distribution. This can be seen in stress mediated cardiomyopathy. No LV  thrombus visualized. There is mild concentric left ventricular  hypertrophy. Indeterminate diastolic filling due to E-A   fusion.   2. Right ventricular systolic function is normal. The right ventricular  size is normal.   3. A small  pericardial effusion is present.   4. The mitral valve is grossly normal. Trivial mitral valve  regurgitation.   5. The aortic valve is tricuspid. There is mild calcification of the  aortic valve. Aortic valve regurgitation is not visualized. Mild aortic  valve sclerosis is present, with no evidence of aortic valve stenosis.   6. The inferior vena cava is normal in size with greater than 50%  respiratory variability, suggesting right atrial pressure of 3 mmHg.     Patient Profile     70 y.o. female with PMH of frequent UTI, restless leg syndrome, HTN,  DM type 2, CKD stage III, hyperlipidemia, peripheral neuropathy, chronic back pain, arthritis, essential thrombocytosis,  who is currently admitted for sepsis due to UTI. Cardiology is consulted and following for elevated troponin, dyspnea, and volume overload.   Assessment & Plan    Elevated troponin in the setting coronary artery calcifications on CTA - patient presented here with fever, chills, and rigors. She was found to have sepsis with UTI, started on empiric coverage and given aggressive IVF resuscitation. 06/29/20 she become increasingly SOB requiring NRB support, denied any chest pain or pressure, dizziness, syncope, heart palpitation. No previous similar symptoms at home. No prior cardiac diagnosis in the past.  Hs strop was checked 1034 > 932 > 375. BNP elevated to 2548.  EKG with sinus tachycardia 130s with associated TWI of lateral leads.  Echo from 06/29/20 showed EF 35-40%, moderate LV depressed function, RWM abnormalities in mid and apical distribution, ? stress mediated cardiomyopathy, indeterminate diastolic function, RV normal, small pericardial effusion and trivial MR, mild aortic sclerosis, no AS.  - she has risk factors for CAD: A1C 7.5%, will check lipid panel tomorrow  - continued on heparin gtt - continue ASA and BBlocker and statin  - suspect etiology of stress induced cardiomyopathy versus ischemic process,  will plan for right and left heart catheterization tomorrow for further evaluation of CAD. The risks [stroke (1 in 1000), death (1 in 24), kidney failure [usually temporary] (1 in 500), bleeding (1 in 200), allergic reaction [possibly serious] (1 in 200)], benefits (diagnostic support and management of coronary artery disease) and alternatives of a cardiac catheterization were discussed in detail with Ms. Weisensel and she is willing to proceed. 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  - ABG from 4/12 consistent hypoxemia, CXR 06/29/20 suggestive pulmonary edema  - received 3-4L IVF resuscitation thus far for sepsis  - CXR repeated today with improving aeration after total Lasix 100mg  over the past 72 hours  - weaned off oxygen currently  - Echo as above, depressed EF, no previous Echo for comparison  - clinically remains mildly hypervolemic, will add one more dose IV lasix 20mg  x1 this afternoon - please monitor strict intake and output, low sodium (2g) diet, judicious use IVF - hopeful to add GDMT pending cath finding/renal function, plan transition to metoprolol XL at time of DC    Sepsis with end organ dysfunction 2/2 complicated UTI  - lactic acid normalized within 24 hours, afebrile,  BP stable, tachycardiac resolved, off oxygen over the past 12 hours - blood culture and urine culture NTD - on cefepime, management per primary team   Hypertension - BP normotensive, continue metorpolol    Hyperlipidemia - check lipid panel tomorrow - continue atorvastatin 40mg    Type 2 DM, non-insulin dependent  - agree with  holding metformin and Novolog SSI while in house - glycemic control is fair - A1C 7.5% this admission, may add SGLT-2i but pending urology evaluation for etiology of frequent UTI  CKD stage IIIa - baseline Cr 1.2-1.3, near baseline  - would monitor for CIN closely with planned cardiac cath   Normocytic Anemia - Hgb 10.5 POA, dropped to 9.6 today, appears in 12 ranges prior - she does not have active bleeding reported - workup per primary team, Hgb ok for cath   Tobacco abuse  - denied hx of asthma or COPD, smokes 3 cig daily - discussed the need of smoking cessation, patient agreeable   Essential thrombocytosis  - follows with hematology Dr Irene Limbo  -  Hydroxyurea currently held per primary team  -  PLT WNL this admission, within goal of [id number] for thromboembolic event risk reduction     For  questions or updates, please contact [facility] Please consult [email or web address] for contact info under        Signed, Margie Billet, NP  06/30/2020, 11:18 AM     Personally seen and examined. Agree with APP above with the following comments: Briefly 70 yo F with HTN, HLD, CKD IIIa, and Tobacco Use had NSTEMI, thought to be sepsis related with new WMAs and a history of   Patient notes SOB overnight requiring O2.  Notes that this scared her some much she could not go back to sleep for fear of dying.  Notes that since the age of 2 she has had something wrong with her. Exam notable for Decreased lung sounds in bases and +1 pitting edema Labs notable for novel troponin 831; event from overnight not consistent with new coronary event. Personally reviewed relevant tests; EF is decreased with WMAs.  No LV thrombus Would recommend  - will add additional IV lasix for today - InterTAK score 62; need to rule out coronary disease given risk factors and TWI. - planned [facility] tomorrow 07/01/20; discussed with patient the reason to do [place] when as euvolemic as possible. - discussed concerns of the procedure; patient notes significant anxiety about the procedure - we do not have a time planned for the procedure, we hope to do the case in the AM  Rudean Haskell, MD [place]  [address] [place], [place] [address] [phone]  11:55 AM

## 2020-06-30 NOTE — Progress Notes (Signed)
[facility] for Heparin Indication: chest pain/ACS  Allergies  Allergen Reactions  . Benadryl [Diphenhydramine Hcl] Other (See Comments)    chills    Patient Measurements: Height: 5\' 4"  (162.6 cm) Weight: 78.5 kg (173 lb 1 oz) IBW/kg (Calculated) : 54.7 Heparin Dosing Weight: 69.3 kg   Vital Signs: Temp: 98 F (36.7 C) (04/13 1157) Temp Source: Oral (04/13 1157) BP: 114/66 (04/13 1157) Pulse Rate: 71 (04/13 1157)  Labs: Recent Labs    06/28/20 1620 06/28/20 2315 06/29/20 0246 06/29/20 0945 06/29/20 2305 06/30/20 0842 06/30/20 1810  HGB 10.5*  --  10.3*  --  9.6*  --   --   HCT 32.5*  --  32.6*  --  29.2*  --   --   PLT 274  --  246  --  305  --   --   APTT 32  --   --   --   --   --   --   LABPROT 13.6  --   --   --   --   --   --   INR 1.1  --   --   --   --   --   --   HEPARINUNFRC  --   --   --    < > 0.23* 0.22* 0.32  CREATININE 1.20*  --  1.23*  --  1.19*  --   --   TROPONINIHS  --  1,034* 932*  --  375*  --   --    < > = values in this interval not displayed.    Estimated Creatinine Clearance: 43.3 mL/min (A) (by C-G formula based on SCr of 1.19 mg/dL (H)).   Assessment: 70 yo female admitted 06/28/2020 with sepsis found to have elevated troponins to 1034. Pharmacy consulted to dose heparin for ACS.  Heparin level is therapeutic at 0.32 units/mL on heparin 1350 units/hr.  No issue with heparin infusion.  Goal of Therapy:  Heparin level 0.3-0.7 units/ml Monitor platelets by anticoagulation protocol: Yes   Plan:  Increase heparin gtt slightly to 1400 units/hr F/U AM labs  Mc Bloodworth D. Mina Marble, PharmD, BCPS, [place] 06/30/2020, 7:25 PM

## 2020-06-30 NOTE — Progress Notes (Signed)
PT Cancellation Note  Patient Details Name: Anna [name] MRN: [id number] DOB: [birth year]   Cancelled Treatment:    Reason Eval/Treat Not Completed: Other (comment). Pt reports she is fatigued after being up/down frequently today and had just finished bathing at the sink. Reports she did well with the above. Also has been OOB in the chair all day.    Shary Decamp [facility] 06/30/2020, 3:38 PM [place] Pager [phone] Office [phone]

## 2020-06-30 NOTE — Plan of Care (Signed)

## 2020-06-30 NOTE — Progress Notes (Signed)
Patient was feeling SOB and tachypnea at 40-46 and heart rate >130. Patient was alert and oriented x4 but anxious, restless and diaphoretic.  Started 02 by non rebreather due to low 02 sat<90%. Notified on call MD [place], rapid response and respiratory. Patient has been done EKG, blood gas and chest x-ray as per ordered. Given iv meds lasix 40mg , metoprolol 5mg  and xopenex nebulizer. Patient is on Bipap. Will continue monitor.

## 2020-06-30 NOTE — Progress Notes (Signed)
[place] for Heparin Indication: chest pain/ACS  Allergies  Allergen Reactions  . Benadryl [Diphenhydramine Hcl] Other (See Comments)    chills    Patient Measurements: Height: 5\' 4"  (162.6 cm) Weight: 78.5 kg (173 lb 1 oz) IBW/kg (Calculated) : 54.7 Heparin Dosing Weight: 69.3 kg   Vital Signs: Temp: 97.6 F (36.4 C) (04/13 0739) Temp Source: Axillary (04/13 0739) BP: 116/73 (04/13 0739) Pulse Rate: 94 (04/13 0739)  Labs: Recent Labs    06/28/20 1620 06/28/20 2315 06/29/20 0246 06/29/20 0945 06/29/20 2032 06/29/20 2305 06/30/20 0842  HGB 10.5*  --  10.3*  --   --  9.6*  --   HCT 32.5*  --  32.6*  --   --  29.2*  --   PLT 274  --  246  --   --  305  --   APTT 32  --   --   --   --   --   --   LABPROT 13.6  --   --   --   --   --   --   INR 1.1  --   --   --   --   --   --   HEPARINUNFRC  --   --   --    < > 0.24* 0.23* 0.22*  CREATININE 1.20*  --  1.23*  --   --  1.19*  --   TROPONINIHS  --  1,034* 932*  --   --  375*  --    < > = values in this interval not displayed.    Estimated Creatinine Clearance: 43.3 mL/min (A) (by C-G formula based on SCr of 1.19 mg/dL (H)).   Assessment: 70 yo female admitted 06/28/2020 with sepsis found to have elevated troponins to 1034. Pharmacy consulted to dose heparin for ACS.  Heparin level is still slightly sub-therapeutic at 0.22 units/mL on heparin 1200 units/hr.  No issue with heparin infusion.  Goal of Therapy:  Heparin level 0.3-0.7 units/ml Monitor platelets by anticoagulation protocol: Yes   Plan:  Increase heparin gtt to 1350 units/hr Heparin level in 8 hours  Nikia Levels A. Levada Dy, PharmD, BCPS, FNKF Clinical Pharmacist [place] Please utilize Amion for appropriate phone number to reach the unit pharmacist ([place])   06/30/2020, 11:10 AM

## 2020-07-01 ENCOUNTER — Inpatient Hospital Stay (HOSPITAL_COMMUNITY)
Admission: EM | Disposition: A | Discharge status: Home / Self Care | Payer: Self-pay | Source: Home / Self Care | Attending: Internal Medicine

## 2020-07-01 DIAGNOSIS — R778 Other specified abnormalities of plasma proteins: Secondary | ICD-10-CM

## 2020-07-01 DIAGNOSIS — I5041 Acute combined systolic (congestive) and diastolic (congestive) heart failure: Secondary | ICD-10-CM

## 2020-07-01 DIAGNOSIS — I2510 Atherosclerotic heart disease of native coronary artery without angina pectoris: Secondary | ICD-10-CM | POA: Diagnosis not present

## 2020-07-01 DIAGNOSIS — I420 Dilated cardiomyopathy: Secondary | ICD-10-CM | POA: Diagnosis not present

## 2020-07-01 DIAGNOSIS — R6520 Severe sepsis without septic shock: Secondary | ICD-10-CM | POA: Diagnosis not present

## 2020-07-01 DIAGNOSIS — R7989 Other specified abnormal findings of blood chemistry: Secondary | ICD-10-CM

## 2020-07-01 DIAGNOSIS — A419 Sepsis, unspecified organism: Secondary | ICD-10-CM | POA: Diagnosis not present

## 2020-07-01 HISTORY — PX: RIGHT/LEFT HEART CATH AND CORONARY ANGIOGRAPHY: CATH118266

## 2020-07-01 LAB — POCT I-STAT EG7
Acid-base deficit: 2 mmol/L (ref 0.0–2.0)
Acid-base deficit: 2 mmol/L (ref 0.0–2.0)
Acid-base deficit: 3 mmol/L — ABNORMAL HIGH (ref 0.0–2.0)
Bicarbonate: 22.5 mmol/L (ref 20.0–28.0)
Bicarbonate: 23.6 mmol/L (ref 20.0–28.0)
Bicarbonate: 24.1 mmol/L (ref 20.0–28.0)
Calcium, Ion: 1.17 mmol/L (ref 1.15–1.40)
Calcium, Ion: 1.17 mmol/L (ref 1.15–1.40)
Calcium, Ion: 1.29 mmol/L (ref 1.15–1.40)
HCT: 24 % — ABNORMAL LOW (ref 36.0–46.0)
HCT: 25 % — ABNORMAL LOW (ref 36.0–46.0)
HCT: 25 % — ABNORMAL LOW (ref 36.0–46.0)
Hemoglobin: 8.2 g/dL — ABNORMAL LOW (ref 12.0–15.0)
Hemoglobin: 8.5 g/dL — ABNORMAL LOW (ref 12.0–15.0)
Hemoglobin: 8.5 g/dL — ABNORMAL LOW (ref 12.0–15.0)
O2 Saturation: 45 %
O2 Saturation: 48 %
O2 Saturation: 53 %
Potassium: 3.6 mmol/L (ref 3.5–5.1)
Potassium: 3.6 mmol/L (ref 3.5–5.1)
Potassium: 3.8 mmol/L (ref 3.5–5.1)
Sodium: 138 mmol/L (ref 135–145)
Sodium: 140 mmol/L (ref 135–145)
Sodium: 140 mmol/L (ref 135–145)
TCO2: 24 mmol/L (ref 22–32)
TCO2: 25 mmol/L (ref 22–32)
TCO2: 25 mmol/L (ref 22–32)
pCO2, Ven: 41.8 mmHg — ABNORMAL LOW (ref 44.0–60.0)
pCO2, Ven: 43.1 mmHg — ABNORMAL LOW (ref 44.0–60.0)
pCO2, Ven: 45.3 mmHg (ref 44.0–60.0)
pH, Ven: 7.334 (ref 7.250–7.430)
pH, Ven: 7.338 (ref 7.250–7.430)
pH, Ven: 7.345 (ref 7.250–7.430)
pO2, Ven: 27 mmHg — CL (ref 32.0–45.0)
pO2, Ven: 28 mmHg — CL (ref 32.0–45.0)
pO2, Ven: 30 mmHg — CL (ref 32.0–45.0)

## 2020-07-01 LAB — CBC
HCT: 31.9 % — ABNORMAL LOW (ref 36.0–46.0)
Hemoglobin: 10.6 g/dL — ABNORMAL LOW (ref 12.0–15.0)
MCH: 32 pg (ref 26.0–34.0)
MCHC: 33.2 g/dL (ref 30.0–36.0)
MCV: 96.4 fL (ref 80.0–100.0)
Platelets: 244 10*3/uL (ref 150–400)
RBC: 3.31 MIL/uL — ABNORMAL LOW (ref 3.87–5.11)
RDW: 18.5 % — ABNORMAL HIGH (ref 11.5–15.5)
WBC: 11.2 10*3/uL — ABNORMAL HIGH (ref 4.0–10.5)
nRBC: 0.2 % (ref 0.0–0.2)

## 2020-07-01 LAB — CBC WITH DIFFERENTIAL/PLATELET
Abs Immature Granulocytes: 0.1 10*3/uL — ABNORMAL HIGH (ref 0.00–0.07)
Basophils Absolute: 0.1 10*3/uL (ref 0.0–0.1)
Basophils Relative: 1 %
Eosinophils Absolute: 0.1 10*3/uL (ref 0.0–0.5)
Eosinophils Relative: 1 %
HCT: 30.6 % — ABNORMAL LOW (ref 36.0–46.0)
Hemoglobin: 9.8 g/dL — ABNORMAL LOW (ref 12.0–15.0)
Immature Granulocytes: 1 %
Lymphocytes Relative: 21 %
Lymphs Abs: 2 10*3/uL (ref 0.7–4.0)
MCH: 32.3 pg (ref 26.0–34.0)
MCHC: 32 g/dL (ref 30.0–36.0)
MCV: 101 fL — ABNORMAL HIGH (ref 80.0–100.0)
Monocytes Absolute: 1 10*3/uL (ref 0.1–1.0)
Monocytes Relative: 10 %
Neutro Abs: 6.2 10*3/uL (ref 1.7–7.7)
Neutrophils Relative %: 66 %
Platelets: 324 10*3/uL (ref 150–400)
RBC: 3.03 MIL/uL — ABNORMAL LOW (ref 3.87–5.11)
RDW: 18.5 % — ABNORMAL HIGH (ref 11.5–15.5)
WBC: 9.5 10*3/uL (ref 4.0–10.5)
nRBC: 0 % (ref 0.0–0.2)

## 2020-07-01 LAB — LIPID PANEL
Cholesterol: 135 mg/dL (ref 0–200)
HDL: 42 mg/dL (ref >40)
LDL Cholesterol: 59 mg/dL (ref 0–99)
Total CHOL/HDL Ratio: 3.2 RATIO
Triglycerides: 168 mg/dL — ABNORMAL HIGH (ref <150)
VLDL: 34 mg/dL (ref 0–40)

## 2020-07-01 LAB — POCT I-STAT 7, (LYTES, BLD GAS, ICA,H+H)
Acid-base deficit: 2 mmol/L (ref 0.0–2.0)
Bicarbonate: 22.8 mmol/L (ref 20.0–28.0)
Calcium, Ion: 1.28 mmol/L (ref 1.15–1.40)
HCT: 25 % — ABNORMAL LOW (ref 36.0–46.0)
Hemoglobin: 8.5 g/dL — ABNORMAL LOW (ref 12.0–15.0)
O2 Saturation: 91 %
Potassium: 3.9 mmol/L (ref 3.5–5.1)
Sodium: 137 mmol/L (ref 135–145)
TCO2: 24 mmol/L (ref 22–32)
pCO2 arterial: 39.6 mmHg (ref 32.0–48.0)
pH, Arterial: 7.367 (ref 7.350–7.450)
pO2, Arterial: 64 mmHg — ABNORMAL LOW (ref 83.0–108.0)

## 2020-07-01 LAB — GLUCOSE, CAPILLARY
Glucose-Capillary: 195 mg/dL — ABNORMAL HIGH (ref 70–99)
Glucose-Capillary: 177 mg/dL — ABNORMAL HIGH (ref 70–99)
Glucose-Capillary: 143 mg/dL — ABNORMAL HIGH (ref 70–99)
Glucose-Capillary: 204 mg/dL — ABNORMAL HIGH (ref 70–99)

## 2020-07-01 LAB — COMPREHENSIVE METABOLIC PANEL
ALT: 19 U/L (ref 0–44)
AST: 24 U/L (ref 15–41)
Albumin: 2.7 g/dL — ABNORMAL LOW (ref 3.5–5.0)
Alkaline Phosphatase: 83 U/L (ref 38–126)
Anion gap: 8 (ref 5–15)
BUN: 28 mg/dL — ABNORMAL HIGH (ref 8–23)
CO2: 26 mmol/L (ref 22–32)
Calcium: 8.8 mg/dL — ABNORMAL LOW (ref 8.9–10.3)
Chloride: 103 mmol/L (ref 98–111)
Creatinine, Ser: 1.38 mg/dL — ABNORMAL HIGH (ref 0.44–1.00)
GFR, Estimated: 41 mL/min — ABNORMAL LOW (ref >60)
Glucose, Bld: 235 mg/dL — ABNORMAL HIGH (ref 70–99)
Potassium: 3.8 mmol/L (ref 3.5–5.1)
Sodium: 137 mmol/L (ref 135–145)
Total Bilirubin: 0.5 mg/dL (ref 0.3–1.2)
Total Protein: 6.5 g/dL (ref 6.5–8.1)

## 2020-07-01 LAB — MAGNESIUM: Magnesium: 2 mg/dL (ref 1.7–2.4)

## 2020-07-01 LAB — CREATININE, SERUM
Creatinine, Ser: 1.1 mg/dL — ABNORMAL HIGH (ref 0.44–1.00)
GFR, Estimated: 53 mL/min — ABNORMAL LOW (ref >60)

## 2020-07-01 LAB — HEPARIN LEVEL (UNFRACTIONATED): Heparin Unfractionated: 0.51 IU/mL (ref 0.30–0.70)

## 2020-07-01 LAB — BRAIN NATRIURETIC PEPTIDE: B Natriuretic Peptide: 1430.1 pg/mL — ABNORMAL HIGH (ref 0.0–100.0)

## 2020-07-01 LAB — PROCALCITONIN: Procalcitonin: 8.12 ng/mL

## 2020-07-01 SURGERY — RIGHT/LEFT HEART CATH AND CORONARY ANGIOGRAPHY
Anesthesia: LOCAL

## 2020-07-01 MED ORDER — HYDRALAZINE HCL 20 MG/ML IJ SOLN: 10.0000 mg | INTRAMUSCULAR | Status: AC | PRN

## 2020-07-01 MED ORDER — DEXTROSE 5 % IV SOLN: INTRAVENOUS | Status: AC

## 2020-07-01 MED ORDER — LIDOCAINE HCL (PF) 1 % IJ SOLN
INTRAMUSCULAR | Status: DC | PRN
  Administered 2020-07-01: 4 mL

## 2020-07-01 MED ORDER — SODIUM CHLORIDE 0.9 % IV SOLN
INTRAVENOUS | Status: AC | PRN
  Administered 2020-07-01: 50 mL/h via INTRAVENOUS

## 2020-07-01 MED ORDER — HEPARIN SODIUM (PORCINE) 5000 UNIT/ML IJ SOLN
5000.0000 [IU] | Freq: Three times a day (TID) | INTRAMUSCULAR | Status: DC
  Administered 2020-07-01 – 2020-07-05 (×10): 5000 [IU] via SUBCUTANEOUS
  Filled 2020-07-01 (×8): qty 1

## 2020-07-01 MED ORDER — SODIUM CHLORIDE 0.9 % IV SOLN: 250.0000 mL | INTRAVENOUS | Status: DC | PRN

## 2020-07-01 MED ORDER — VERAPAMIL HCL 2.5 MG/ML IV SOLN
INTRAVENOUS | Status: AC
  Filled 2020-07-01: qty 2

## 2020-07-01 MED ORDER — SODIUM CHLORIDE 0.9% FLUSH: 3.0000 mL | INTRAVENOUS | Status: DC | PRN

## 2020-07-01 MED ORDER — SODIUM CHLORIDE 0.9 % IV SOLN: INTRAVENOUS | Status: DC

## 2020-07-01 MED ORDER — MIDAZOLAM HCL 2 MG/2ML IJ SOLN
INTRAMUSCULAR | Status: AC
  Filled 2020-07-01: qty 2

## 2020-07-01 MED ORDER — ASPIRIN 81 MG PO CHEW: 81.0000 mg | CHEWABLE_TABLET | ORAL | Status: AC

## 2020-07-01 MED ORDER — NITROGLYCERIN 1 MG/10 ML FOR IR/CATH LAB
INTRA_ARTERIAL | Status: AC
  Filled 2020-07-01: qty 10

## 2020-07-01 MED ORDER — SODIUM CHLORIDE 0.9 % IV SOLN: INTRAVENOUS | Status: AC

## 2020-07-01 MED ORDER — NITROGLYCERIN 1 MG/10 ML FOR IR/CATH LAB
INTRA_ARTERIAL | Status: DC | PRN
  Administered 2020-07-01: 200 ug via INTRACORONARY

## 2020-07-01 MED ORDER — HEPARIN SODIUM (PORCINE) 1000 UNIT/ML IJ SOLN
INTRAMUSCULAR | Status: DC | PRN
  Administered 2020-07-01: 4000 [IU] via INTRAVENOUS

## 2020-07-01 MED ORDER — LIDOCAINE HCL (PF) 1 % IJ SOLN
INTRAMUSCULAR | Status: AC
  Filled 2020-07-01: qty 30

## 2020-07-01 MED ORDER — IOHEXOL 350 MG/ML SOLN
INTRAVENOUS | Status: DC | PRN
  Administered 2020-07-01: 65 mL

## 2020-07-01 MED ORDER — FENTANYL CITRATE (PF) 100 MCG/2ML IJ SOLN
INTRAMUSCULAR | Status: AC
  Filled 2020-07-01: qty 2

## 2020-07-01 MED ORDER — FUROSEMIDE 10 MG/ML IJ SOLN
INTRAMUSCULAR | Status: AC
  Filled 2020-07-01: qty 4

## 2020-07-01 MED ORDER — SODIUM CHLORIDE 0.9% FLUSH
3.0000 mL | Freq: Two times a day (BID) | INTRAVENOUS | Status: DC
  Administered 2020-07-02 – 2020-07-04 (×6): 3 mL via INTRAVENOUS

## 2020-07-01 MED ORDER — FUROSEMIDE 10 MG/ML IJ SOLN
INTRAMUSCULAR | Status: DC | PRN
  Administered 2020-07-01: 40 mg via INTRAVENOUS

## 2020-07-01 MED ORDER — HEPARIN SODIUM (PORCINE) 1000 UNIT/ML IJ SOLN
INTRAMUSCULAR | Status: AC
  Filled 2020-07-01: qty 1

## 2020-07-01 MED ORDER — HEPARIN (PORCINE) IN NACL 1000-0.9 UT/500ML-% IV SOLN
INTRAVENOUS | Status: AC
  Filled 2020-07-01: qty 1000

## 2020-07-01 MED ORDER — FENTANYL CITRATE (PF) 100 MCG/2ML IJ SOLN
INTRAMUSCULAR | Status: DC | PRN
  Administered 2020-07-01: 25 ug via INTRAVENOUS

## 2020-07-01 MED ORDER — LABETALOL HCL 5 MG/ML IV SOLN: 10.0000 mg | INTRAVENOUS | Status: AC | PRN

## 2020-07-01 MED ORDER — HEPARIN (PORCINE) IN NACL 1000-0.9 UT/500ML-% IV SOLN
INTRAVENOUS | Status: DC | PRN
  Administered 2020-07-01 (×2): 500 mL

## 2020-07-01 MED ORDER — ASPIRIN 81 MG PO CHEW: 81.0000 mg | CHEWABLE_TABLET | ORAL | Status: DC

## 2020-07-01 MED ORDER — MIDAZOLAM HCL 2 MG/2ML IJ SOLN
INTRAMUSCULAR | Status: DC | PRN
  Administered 2020-07-01: 1 mg via INTRAVENOUS

## 2020-07-01 MED ORDER — FUROSEMIDE 10 MG/ML IJ SOLN
40.0000 mg | Freq: Two times a day (BID) | INTRAMUSCULAR | Status: DC
  Administered 2020-07-02 – 2020-07-03 (×3): 40 mg via INTRAVENOUS
  Filled 2020-07-01 (×3): qty 4

## 2020-07-01 SURGICAL SUPPLY — 11 items
CATH INFINITI 5 FR JL3.5 (CATHETERS) ×1 IMPLANT
CATH OPTITORQUE TIG 4.0 5F (CATHETERS) ×1 IMPLANT
CATH SWAN GANZ 7F STRAIGHT (CATHETERS) ×1 IMPLANT
DEVICE RAD COMP TR BAND LRG (VASCULAR PRODUCTS) ×1 IMPLANT
GLIDESHEATH SLEND SS 6F .021 (SHEATH) ×1 IMPLANT
GUIDEWIRE INQWIRE 1.5J.035X260 (WIRE) IMPLANT
INQWIRE 1.5J .035X260CM (WIRE) ×2
KIT HEART LEFT (KITS) ×2 IMPLANT
PACK CARDIAC CATHETERIZATION (CUSTOM PROCEDURE TRAY) ×2 IMPLANT
TRANSDUCER W/STOPCOCK (MISCELLANEOUS) ×2 IMPLANT
TUBING CIL FLEX 10 FLL-RA (TUBING) ×2 IMPLANT

## 2020-07-01 NOTE — Progress Notes (Signed)
PT Cancellation Note  Patient Details Name: [name] MRN: [id number] DOB: [birth year]   Cancelled Treatment:    Reason Eval/Treat Not Completed: Other (comment). Pt up earlier and now waiting to go to cardiac cath. Defers mobility at this time.    Shary Decamp [facility] 07/01/2020, 10:55 AM [place] Pager [phone] Office [phone]

## 2020-07-01 NOTE — Progress Notes (Addendum)
Progress Note  Patient Name: Anna [name] Date of Encounter: 07/01/2020  [facility] Cardiologist: Werner Lean, MD   Subjective   Feeling okay today. Reported concerns about going to sleep last night but did not have any more SOB overnight. No complaints of chest pain/SOB. CHF education reviewed with patient today. Going for [facility] later today - all questions answered.   Inpatient Medications    Scheduled Meds: . aspirin EC  81 mg Oral Daily  . atorvastatin  40 mg Oral Daily  . insulin aspart  0-9 Units Subcutaneous TID WC  . metoprolol tartrate  25 mg Oral BID  . pregabalin  50 mg Oral BID   Continuous Infusions: . ceFEPime (MAXIPIME) IV 2 g (07/01/20 0650)  . dextrose    . heparin 1,400 Units/hr (07/01/20 0214)   PRN Meds: acetaminophen **OR** [DISCONTINUED] acetaminophen, melatonin, metoprolol tartrate   Vital Signs    Vitals:   06/30/20 2356 07/01/20 0400 07/01/20 0434 07/01/20 0818  BP: 134/81 134/69  134/89  Pulse: (!) 101 95  98  Resp: 20 (!) 24  (!) 23  Temp: 98.1 F (36.7 C) 98 F (36.7 C)  98.6 F (37 C)  TempSrc: Axillary Oral  Oral  SpO2: 91% 92%  94%  Weight:   81.6 kg 77.5 kg  Height:        Intake/Output Summary (Last 24 hours) at 07/01/2020 0910 Last data filed at 07/01/2020 0714 Gross per 24 hour  Intake 717.17 ml  Output 1100 ml  Net -382.83 ml   Last 3 Weights 07/01/2020 07/01/2020 06/30/2020  Weight (lbs) 170 lb 14.4 oz 179 lb 14.3 oz 173 lb 1 oz  Weight (kg) 77.52 kg 81.6 kg 78.5 kg      Telemetry    Sinus rhyhtm - Personally Reviewed  ECG    No new tracings - Personally Reviewed  Physical Exam   GEN: No acute distress.   Neck: No JVD Cardiac: RRR, no murmurs, rubs, or gallops.  Respiratory: Clear to auscultation bilaterally. GI: Soft, obese, nontender, non-distended  MS: No edema; No deformity. Neuro:  Nonfocal  Psych: Normal affect   Labs    High Sensitivity Troponin:   Recent Labs  Lab 06/28/20 2315  06/29/20 0246 06/29/20 2305  TROPONINIHS 1,034* 932* 375*      Chemistry Recent Labs  Lab 06/29/20 0246 06/29/20 2305 07/01/20 0021  NA 136 137 137  K 3.8 3.3* 3.8  CL 105 105 103  CO2 20* 21* 26  GLUCOSE 220* 212* 235*  BUN 22 23 28*  CREATININE 1.23* 1.19* 1.38*  CALCIUM 8.7* 8.9 8.8*  PROT 5.9* 6.3* 6.5  ALBUMIN 2.7* 2.7* 2.7*  AST 17 17 24   ALT 15 16 19   ALKPHOS 71 71 83  BILITOT 0.8 0.8 0.5  GFRNONAA 47* 49* 41*  ANIONGAP 11 11 8      Hematology Recent Labs  Lab 06/29/20 0246 06/29/20 2305 07/01/20 0021  WBC 15.8* 15.5* 9.5  RBC 3.24* 2.95* 3.03*  HGB 10.3* 9.6* 9.8*  HCT 32.6* 29.2* 30.6*  MCV 100.6* 99.0 101.0*  MCH 31.8 32.5 32.3  MCHC 31.6 32.9 32.0  RDW 18.5* 18.4* 18.5*  PLT 246 305 324    BNP Recent Labs  Lab 06/29/20 2305 07/01/20 0021  BNP 2,548.4* 1,430.1*     DDimer No results for input(s): DDIMER in the last 168 hours.   Radiology    US RENAL  Result Date: 06/29/2020 CLINICAL DATA:  Acute kidney injury EXAM:  RENAL / URINARY TRACT ULTRASOUND COMPLETE COMPARISON:  Ultrasound 01/09/2019 FINDINGS: Right Kidney: Renal measurements: 9.4 x 4.2 x 3.9 cm = volume: 81.5 mL. Cortical echogenicity is within normal limits. No mass or hydronephrosis. Possible areas of cortical thinning and scarring. Left Kidney: Renal measurements: 10.5 x 4.9 x 4 cm = volume: 107.1 mL. Cortical echogenicity within normal limits possible cortical thinning at the upper pole. Bladder: Appears normal for degree of bladder distention. Other: None. IMPRESSION: 1. Negative for hydronephrosis. 2. Probable cortical thinning upper pole left kidney and possible cortical thinning scarring in the right kidney Electronically Signed   By: Donavan Foil M.D.   On: 06/29/2020 19:43   DG Chest Port 1 View  Result Date: 06/30/2020 CLINICAL DATA:  Shortness of breath EXAM: PORTABLE CHEST 1 VIEW COMPARISON:  06/29/2020 FINDINGS: Cardiac shadow is stable. Significant improved aeration is  noted bilaterally with the exception of some mild right basilar atelectasis. No new focal abnormality is seen. IMPRESSION: Mild right basilar atelectasis. Otherwise significant improved aeration bilaterally Electronically Signed   By: Inez Catalina M.D.   On: 06/30/2020 08:55   DG CHEST PORT 1 VIEW  Result Date: 06/29/2020 CLINICAL DATA:  Dyspnea. EXAM: PORTABLE CHEST 1 VIEW COMPARISON:  Radiographs and CT earlier today. FINDINGS: Stable heart size and mediastinal contours. Again seen diffuse bronchial thickening. No acute or progressive airspace disease. Linear scarring in the left mid lung. No pleural effusion or pneumothorax. Stable osseous structures. IMPRESSION: Diffuse bronchial thickening, unchanged from CT earlier this day. No acute or progressive airspace disease. Electronically Signed   By: Keith Rake M.D.   On: 06/29/2020 21:17   ECHOCARDIOGRAM COMPLETE  Result Date: 06/30/2020    ECHOCARDIOGRAM REPORT   Patient Name:   Anna [name] Date of Exam: 06/29/2020 Medical Rec #:  [id number]      Height:       64.0 in Accession #:    [id number]     Weight:       157.8 lb Date of Birth:  1950/12/03      BSA:          1.769 m Patient Age:    3 years       BP:           110/75 mmHg Patient Gender: F              HR:           115 bpm. Exam Location:  Inpatient Procedure: 2D Echo, Cardiac Doppler and Color Doppler Indications:    R01.1 Murmur  History:        Patient has no prior history of Echocardiogram examinations.                 Signs/Symptoms:Shortness of Breath.  Sonographer:    [name] RDCS Referring Phys: [id number] [facility]  1. Left ventricular ejection fraction, by estimation, is 35 to 40%. Left ventricular ejection fraction by 3D volume is 37 %. The left ventricle has moderately decreased function. The left ventricle demonstrates regional wall motion abnormalities (see scoring diagram/findings for description) in the mid and apical distribution. This can be seen in stress  mediated cardiomyopathy. No LV thrombus visualized. There is mild concentric left ventricular hypertrophy. Indeterminate diastolic filling due to E-A  fusion.  2. Right ventricular systolic function is normal. The right ventricular size is normal.  3. A small pericardial effusion is present.  4. The mitral valve is grossly normal. Trivial mitral valve regurgitation.  5. The aortic valve is tricuspid. There is mild calcification of the aortic valve. Aortic valve regurgitation is not visualized. Mild aortic valve sclerosis is present, with no evidence of aortic valve stenosis.  6. The inferior vena cava is normal in size with greater than 50% respiratory variability, suggesting right atrial pressure of 3 mmHg. Comparison(s): No prior Echocardiogram. FINDINGS  Left Ventricle: Left ventricular ejection fraction, by estimation, is 35 to 40%. Left ventricular ejection fraction by 3D volume is 37 %. The left ventricle has moderately decreased function. The left ventricle demonstrates regional wall motion abnormalities. The left ventricular internal cavity size was normal in size. There is mild concentric left ventricular hypertrophy. Indeterminate diastolic filling due to E-A fusion.  LV Wall Scoring: The mid and distal lateral wall, mid and distal anterior septum, entire apex, mid anterolateral segment, and mid inferoseptal segment are hypokinetic. Right Ventricle: The right ventricular size is normal. No increase in right ventricular wall thickness. Right ventricular systolic function is normal. Left Atrium: Left atrial size was normal in size. Right Atrium: Right atrial size was normal in size. Pericardium: A small pericardial effusion is present. Mitral Valve: The mitral valve is grossly normal. Trivial mitral valve regurgitation. Tricuspid Valve: The tricuspid valve is normal in structure. Tricuspid valve regurgitation is not demonstrated. No evidence of tricuspid stenosis. Aortic Valve: The aortic valve is tricuspid.  There is mild calcification of the aortic valve. Aortic valve regurgitation is not visualized. Mild aortic valve sclerosis is present, with no evidence of aortic valve stenosis. Aortic valve mean gradient measures 6.0 mmHg. Aortic valve peak gradient measures 10.8 mmHg. Aortic valve area, by VTI measures 1.13 cm. Pulmonic Valve: The pulmonic valve was grossly normal. Pulmonic valve regurgitation is not visualized. No evidence of pulmonic stenosis. Aorta: The aortic root is normal in size and structure. Venous: The inferior vena cava is normal in size with greater than 50% respiratory variability, suggesting right atrial pressure of 3 mmHg. IAS/Shunts: The atrial septum is grossly normal.  LEFT VENTRICLE PLAX 2D LVIDd:         5.00 cm LVIDs:         3.70 cm LV PW:         1.10 cm         3D Volume EF LV IVS:        1.10 cm         LV 3D EF:    Left LVOT diam:     1.80 cm                      ventricular LV SV:         28                           ejection LV SV Index:   16                           fraction by LVOT Area:     2.54 cm                     3D volume                                             is 37 %.  LV Volumes (  MOD) LV vol d, MOD    77.8 ml       3D Volume EF: A2C:                           3D EF:        37 % LV vol d, MOD    63.9 ml       LV EDV:       146 ml A4C:                           LV ESV:       91 ml LV vol s, MOD    52.6 ml       LV SV:        54 ml A2C: LV vol s, MOD    50.2 ml A4C: LV SV MOD A2C:   25.2 ml LV SV MOD A4C:   63.9 ml LV SV MOD BP:    22.8 ml RIGHT VENTRICLE             IVC RV Basal diam:  2.90 cm     IVC diam: 1.80 cm RV S prime:     11.60 cm/s TAPSE (M-mode): 1.6 cm LEFT ATRIUM             Index       RIGHT ATRIUM           Index LA diam:        4.20 cm 2.37 cm/m  RA Area:     10.70 cm LA Vol (A2C):   46.6 ml 26.34 ml/m RA Volume:   24.10 ml  13.62 ml/m LA Vol (A4C):   41.2 ml 23.29 ml/m LA Biplane Vol: 46.8 ml 26.45 ml/m  AORTIC VALVE AV Area (Vmax):    1.20 cm AV  Area (Vmean):   1.17 cm AV Area (VTI):     1.13 cm AV Vmax:           164.00 cm/s AV Vmean:          115.000 cm/s AV VTI:            0.246 m AV Peak Grad:      10.8 mmHg AV Mean Grad:      6.0 mmHg LVOT Vmax:         77.10 cm/s LVOT Vmean:        53.100 cm/s LVOT VTI:          0.109 m LVOT/AV VTI ratio: 0.44  AORTA Ao Root diam: 2.60 cm  SHUNTS Systemic VTI:  0.11 m Systemic Diam: 1.80 cm Rudean Haskell MD Electronically signed by Rudean Haskell MD Signature Date/Time: 06/30/2020/9:19:12 AM    Final     Cardiac Studies   Echo from 06/29/20:  1. Left ventricular ejection fraction, by estimation, is 35 to 40%. Left  ventricular ejection fraction by 3D volume is 37 %. The left ventricle has  moderately decreased function. The left ventricle demonstrates regional  wall motion abnormalities (see  scoring diagram/findings for description) in the mid and apical  distribution. This can be seen in stress mediated cardiomyopathy. No LV  thrombus visualized. There is mild concentric left ventricular  hypertrophy. Indeterminate diastolic filling due to E-A  fusion.  2. Right ventricular systolic function is normal. The right ventricular  size is normal.  3. A small pericardial effusion is present.  4. The mitral valve is  grossly normal. Trivial mitral valve  regurgitation.  5. The aortic valve is tricuspid. There is mild calcification of the  aortic valve. Aortic valve regurgitation is not visualized. Mild aortic  valve sclerosis is present, with no evidence of aortic valve stenosis.  6. The inferior vena cava is normal in size with greater than 50%  respiratory variability, suggesting right atrial pressure of 3 mmHg.  Patient Profile     70 y.o. female with PMH of frequent UTI, restless leg syndrome, HTN, DM type 2, CKD stage III, hyperlipidemia, peripheral neuropathy, chronic back pain, arthritis, essential thrombocytosis,  who is currently admitted for sepsis due to UTI.  Cardiology is following for elevated troponin, dyspnea, and volume overload.   Assessment & Plan    1. NSTEMI in patient with coronary artery calcifications: patient presented with sepsis with episodes of acute hypoxic respiratory failure this admission following IVF resuscitation. HsTrop peaked at 1034 and trended down. BNP elevated to 2500. EKG showed sinus tachycardia with lateral TWI. Echo showed EF 35-40% with mid-apical WMA c/f stress mediated cardiomyopathy. Risk factors for CAD include HTN, HLD, DM type 2, and tobacco abuse. Decision made to pursue [facility] to evaluate coronary anatomy - Keep NPO for LHC today - Continue heparin gtt for now - Continue aspirin and statin - Continue BBlocker  2. Acute combined CHF: patient with episodes of hypoxemic respiratory failure this admission, likely 2/2 IVF resuscitation in the setting of sepsis. Found to have newly reduced EF 35-40% with RWMA on echo this admission c/f stress mediated cardiomyopathy. She was diuresed with IV lasix with UOP net +486mL in the past 24 hours with at least 1 unmeasured UOP occurrence. Weight if accurate is quite labile this admission from 157lbs>179lbs>170lbs.  Cr bumped a bit to 1.38 today from 1.19 yesterday. BNP improved from 2500>1400 - Hold lasix in anticipation of R/LHC today - Continue metoprolol with plans to transition to Toprol-XL prior to discharge - Hopeful to add ARB/Entresto if Cr improves post-cath - Continue to monitor strict I&Os and daily weights - Continue to monitor electrolytes and replete as needed to maintain K>4, Mg>2  3. Sepsis with end organ dysfunction 2/2 complicated UTI: patient presented with fever. Admitted to medicine for sepsis. Procalcitonin significantly elevated though surprisingly BCx/UCx remain negative so far. She continues on IV antibiotics - Continue management per primary team  4. HTN: BP stable - Managed in the context of #2  5. HLD: LDL 59, triglyceride 168 this admission -  Continue atorvastatin - Could consider addition of fenofibrate if cath is revealing of CAD  6. DM type 2: A1C 7.5 this admission; goal <7. With frequent UTI's, addition of SGLT2-inhibitor may be contraindicated - Continue management per primary team  7. CKD stage 3a: Cr 1.38 today, up a bit from 1.19 yesterday. - Continue to monitor closely post-cath  8. Anemia/thrombocytocytosis: follows with Dr. Irene Limbo outpatient for thrombocytosis on hydroxyurea. Hgb stable at 9.8 today - Continue management per primary team  9. Tobacco abuse: reports smoking 3 cigarettes per day since age 62.  - Continue to encourage cessation.     For questions or updates, please contact [place] Please consult [email or web address] for contact info under      Signed, Abigail Butts, PA-C  07/01/2020, 9:10 AM    Personally seen and examined. Agree with APP above with the following comments: Briefly 70 yo F with NSTEMI and concerns for Stress medicated cardimopathy Patient notes that she has no further breathing issues overnight. Exam notable  for non pitting edema bilaterally. Labs notable for slight increase in creatinine and elevation in BNP Personally reviewed relevant tests; no concerning arrhythmias on telemetry Would recommend  - planned for Heart Catheterization ([place] today).  No further questions about procedure; discussed with husband as well- who clarifies that their family cardiologist is Dr. Irish Lack

## 2020-07-01 NOTE — H&P (View-Only) (Signed)
Progress Note  Patient Name: Anna [name] Date of Encounter: 07/01/2020  [facility] Cardiologist: Werner Lean, MD   Subjective   Feeling okay today. Reported concerns about going to sleep last night but did not have any more SOB overnight. No complaints of chest pain/SOB. CHF education reviewed with patient today. Going for [facility] later today - all questions answered.   Inpatient Medications    Scheduled Meds: . aspirin EC  81 mg Oral Daily  . atorvastatin  40 mg Oral Daily  . insulin aspart  0-9 Units Subcutaneous TID WC  . metoprolol tartrate  25 mg Oral BID  . pregabalin  50 mg Oral BID   Continuous Infusions: . ceFEPime (MAXIPIME) IV 2 g (07/01/20 0650)  . dextrose    . heparin 1,400 Units/hr (07/01/20 0214)   PRN Meds: acetaminophen **OR** [DISCONTINUED] acetaminophen, melatonin, metoprolol tartrate   Vital Signs    Vitals:   06/30/20 2356 07/01/20 0400 07/01/20 0434 07/01/20 0818  BP: 134/81 134/69  134/89  Pulse: (!) 101 95  98  Resp: 20 (!) 24  (!) 23  Temp: 98.1 F (36.7 C) 98 F (36.7 C)  98.6 F (37 C)  TempSrc: Axillary Oral  Oral  SpO2: 91% 92%  94%  Weight:   81.6 kg 77.5 kg  Height:        Intake/Output Summary (Last 24 hours) at 07/01/2020 0910 Last data filed at 07/01/2020 0714 Gross per 24 hour  Intake 717.17 ml  Output 1100 ml  Net -382.83 ml   Last 3 Weights 07/01/2020 07/01/2020 06/30/2020  Weight (lbs) 170 lb 14.4 oz 179 lb 14.3 oz 173 lb 1 oz  Weight (kg) 77.52 kg 81.6 kg 78.5 kg      Telemetry    Sinus rhyhtm - Personally Reviewed  ECG    No new tracings - Personally Reviewed  Physical Exam   GEN: No acute distress.   Neck: No JVD Cardiac: RRR, no murmurs, rubs, or gallops.  Respiratory: Clear to auscultation bilaterally. GI: Soft, obese, nontender, non-distended  MS: No edema; No deformity. Neuro:  Nonfocal  Psych: Normal affect   Labs    High Sensitivity Troponin:   Recent Labs  Lab 06/28/20 2315  06/29/20 0246 06/29/20 2305  TROPONINIHS 1,034* 932* 375*      Chemistry Recent Labs  Lab 06/29/20 0246 06/29/20 2305 07/01/20 0021  NA 136 137 137  K 3.8 3.3* 3.8  CL 105 105 103  CO2 20* 21* 26  GLUCOSE 220* 212* 235*  BUN 22 23 28*  CREATININE 1.23* 1.19* 1.38*  CALCIUM 8.7* 8.9 8.8*  PROT 5.9* 6.3* 6.5  ALBUMIN 2.7* 2.7* 2.7*  AST 17 17 24   ALT 15 16 19   ALKPHOS 71 71 83  BILITOT 0.8 0.8 0.5  GFRNONAA 47* 49* 41*  ANIONGAP 11 11 8      Hematology Recent Labs  Lab 06/29/20 0246 06/29/20 2305 07/01/20 0021  WBC 15.8* 15.5* 9.5  RBC 3.24* 2.95* 3.03*  HGB 10.3* 9.6* 9.8*  HCT 32.6* 29.2* 30.6*  MCV 100.6* 99.0 101.0*  MCH 31.8 32.5 32.3  MCHC 31.6 32.9 32.0  RDW 18.5* 18.4* 18.5*  PLT 246 305 324    BNP Recent Labs  Lab 06/29/20 2305 07/01/20 0021  BNP 2,548.4* 1,430.1*     DDimer No results for input(s): DDIMER in the last 168 hours.   Radiology    US RENAL  Result Date: 06/29/2020 CLINICAL DATA:  Acute kidney injury EXAM:  RENAL / URINARY TRACT ULTRASOUND COMPLETE COMPARISON:  Ultrasound 01/09/2019 FINDINGS: Right Kidney: Renal measurements: 9.4 x 4.2 x 3.9 cm = volume: 81.5 mL. Cortical echogenicity is within normal limits. No mass or hydronephrosis. Possible areas of cortical thinning and scarring. Left Kidney: Renal measurements: 10.5 x 4.9 x 4 cm = volume: 107.1 mL. Cortical echogenicity within normal limits possible cortical thinning at the upper pole. Bladder: Appears normal for degree of bladder distention. Other: None. IMPRESSION: 1. Negative for hydronephrosis. 2. Probable cortical thinning upper pole left kidney and possible cortical thinning scarring in the right kidney Electronically Signed   By: Donavan Foil M.D.   On: 06/29/2020 19:43   DG Chest Port 1 View  Result Date: 06/30/2020 CLINICAL DATA:  Shortness of breath EXAM: PORTABLE CHEST 1 VIEW COMPARISON:  06/29/2020 FINDINGS: Cardiac shadow is stable. Significant improved aeration is  noted bilaterally with the exception of some mild right basilar atelectasis. No new focal abnormality is seen. IMPRESSION: Mild right basilar atelectasis. Otherwise significant improved aeration bilaterally Electronically Signed   By: Inez Catalina M.D.   On: 06/30/2020 08:55   DG CHEST PORT 1 VIEW  Result Date: 06/29/2020 CLINICAL DATA:  Dyspnea. EXAM: PORTABLE CHEST 1 VIEW COMPARISON:  Radiographs and CT earlier today. FINDINGS: Stable heart size and mediastinal contours. Again seen diffuse bronchial thickening. No acute or progressive airspace disease. Linear scarring in the left mid lung. No pleural effusion or pneumothorax. Stable osseous structures. IMPRESSION: Diffuse bronchial thickening, unchanged from CT earlier this day. No acute or progressive airspace disease. Electronically Signed   By: Keith Rake M.D.   On: 06/29/2020 21:17   ECHOCARDIOGRAM COMPLETE  Result Date: 06/30/2020    ECHOCARDIOGRAM REPORT   Patient Name:   Anna [name] Date of Exam: 06/29/2020 Medical Rec #:  [id number]      Height:       64.0 in Accession #:    [id number]     Weight:       157.8 lb Date of Birth:  September 25, 1950      BSA:          1.769 m Patient Age:    70 years       BP:           110/75 mmHg Patient Gender: F              HR:           115 bpm. Exam Location:  Inpatient Procedure: 2D Echo, Cardiac Doppler and Color Doppler Indications:    R01.1 Murmur  History:        Patient has no prior history of Echocardiogram examinations.                 Signs/Symptoms:Shortness of Breath.  Sonographer:    [name] RDCS Referring Phys: [id number] Margaree Mackintosh Ripley  1. Left ventricular ejection fraction, by estimation, is 35 to 40%. Left ventricular ejection fraction by 3D volume is 37 %. The left ventricle has moderately decreased function. The left ventricle demonstrates regional wall motion abnormalities (see scoring diagram/findings for description) in the mid and apical distribution. This can be seen in stress  mediated cardiomyopathy. No LV thrombus visualized. There is mild concentric left ventricular hypertrophy. Indeterminate diastolic filling due to E-A  fusion.  2. Right ventricular systolic function is normal. The right ventricular size is normal.  3. A small pericardial effusion is present.  4. The mitral valve is grossly normal. Trivial mitral valve regurgitation.  5. The aortic valve is tricuspid. There is mild calcification of the aortic valve. Aortic valve regurgitation is not visualized. Mild aortic valve sclerosis is present, with no evidence of aortic valve stenosis.  6. The inferior vena cava is normal in size with greater than 50% respiratory variability, suggesting right atrial pressure of 3 mmHg. Comparison(s): No prior Echocardiogram. FINDINGS  Left Ventricle: Left ventricular ejection fraction, by estimation, is 35 to 40%. Left ventricular ejection fraction by 3D volume is 37 %. The left ventricle has moderately decreased function. The left ventricle demonstrates regional wall motion abnormalities. The left ventricular internal cavity size was normal in size. There is mild concentric left ventricular hypertrophy. Indeterminate diastolic filling due to E-A fusion.  LV Wall Scoring: The mid and distal lateral wall, mid and distal anterior septum, entire apex, mid anterolateral segment, and mid inferoseptal segment are hypokinetic. Right Ventricle: The right ventricular size is normal. No increase in right ventricular wall thickness. Right ventricular systolic function is normal. Left Atrium: Left atrial size was normal in size. Right Atrium: Right atrial size was normal in size. Pericardium: A small pericardial effusion is present. Mitral Valve: The mitral valve is grossly normal. Trivial mitral valve regurgitation. Tricuspid Valve: The tricuspid valve is normal in structure. Tricuspid valve regurgitation is not demonstrated. No evidence of tricuspid stenosis. Aortic Valve: The aortic valve is tricuspid.  There is mild calcification of the aortic valve. Aortic valve regurgitation is not visualized. Mild aortic valve sclerosis is present, with no evidence of aortic valve stenosis. Aortic valve mean gradient measures 6.0 mmHg. Aortic valve peak gradient measures 10.8 mmHg. Aortic valve area, by VTI measures 1.13 cm. Pulmonic Valve: The pulmonic valve was grossly normal. Pulmonic valve regurgitation is not visualized. No evidence of pulmonic stenosis. Aorta: The aortic root is normal in size and structure. Venous: The inferior vena cava is normal in size with greater than 50% respiratory variability, suggesting right atrial pressure of 3 mmHg. IAS/Shunts: The atrial septum is grossly normal.  LEFT VENTRICLE PLAX 2D LVIDd:         5.00 cm LVIDs:         3.70 cm LV PW:         1.10 cm         3D Volume EF LV IVS:        1.10 cm         LV 3D EF:    Left LVOT diam:     1.80 cm                      ventricular LV SV:         28                           ejection LV SV Index:   16                           fraction by LVOT Area:     2.54 cm                     3D volume                                             is 37 %.  LV Volumes (  MOD) LV vol d, MOD    77.8 ml       3D Volume EF: A2C:                           3D EF:        37 % LV vol d, MOD    63.9 ml       LV EDV:       146 ml A4C:                           LV ESV:       91 ml LV vol s, MOD    52.6 ml       LV SV:        54 ml A2C: LV vol s, MOD    50.2 ml A4C: LV SV MOD A2C:   25.2 ml LV SV MOD A4C:   63.9 ml LV SV MOD BP:    22.8 ml RIGHT VENTRICLE             IVC RV Basal diam:  2.90 cm     IVC diam: 1.80 cm RV S prime:     11.60 cm/s TAPSE (M-mode): 1.6 cm LEFT ATRIUM             Index       RIGHT ATRIUM           Index LA diam:        4.20 cm 2.37 cm/m  RA Area:     10.70 cm LA Vol (A2C):   46.6 ml 26.34 ml/m RA Volume:   24.10 ml  13.62 ml/m LA Vol (A4C):   41.2 ml 23.29 ml/m LA Biplane Vol: 46.8 ml 26.45 ml/m  AORTIC VALVE AV Area (Vmax):    1.20 cm AV  Area (Vmean):   1.17 cm AV Area (VTI):     1.13 cm AV Vmax:           164.00 cm/s AV Vmean:          115.000 cm/s AV VTI:            0.246 m AV Peak Grad:      10.8 mmHg AV Mean Grad:      6.0 mmHg LVOT Vmax:         77.10 cm/s LVOT Vmean:        53.100 cm/s LVOT VTI:          0.109 m LVOT/AV VTI ratio: 0.44  AORTA Ao Root diam: 2.60 cm  SHUNTS Systemic VTI:  0.11 m Systemic Diam: 1.80 cm Rudean Haskell MD Electronically signed by Rudean Haskell MD Signature Date/Time: 06/30/2020/9:19:12 AM    Final     Cardiac Studies   Echo from 06/29/20:  1. Left ventricular ejection fraction, by estimation, is 35 to 40%. Left  ventricular ejection fraction by 3D volume is 37 %. The left ventricle has  moderately decreased function. The left ventricle demonstrates regional  wall motion abnormalities (see  scoring diagram/findings for description) in the mid and apical  distribution. This can be seen in stress mediated cardiomyopathy. No LV  thrombus visualized. There is mild concentric left ventricular  hypertrophy. Indeterminate diastolic filling due to E-A  fusion.  2. Right ventricular systolic function is normal. The right ventricular  size is normal.  3. A small pericardial effusion is present.  4. The mitral valve is  grossly normal. Trivial mitral valve  regurgitation.  5. The aortic valve is tricuspid. There is mild calcification of the  aortic valve. Aortic valve regurgitation is not visualized. Mild aortic  valve sclerosis is present, with no evidence of aortic valve stenosis.  6. The inferior vena cava is normal in size with greater than 50%  respiratory variability, suggesting right atrial pressure of 3 mmHg.  Patient Profile     70 y.o. female with PMH of frequent UTI, restless leg syndrome, HTN, DM type 2, CKD stage III, hyperlipidemia, peripheral neuropathy, chronic back pain, arthritis, essential thrombocytosis,  who is currently admitted for sepsis due to UTI.  Cardiology is following for elevated troponin, dyspnea, and volume overload.   Assessment & Plan    1. NSTEMI in patient with coronary artery calcifications: patient presented with sepsis with episodes of acute hypoxic respiratory failure this admission following IVF resuscitation. HsTrop peaked at 1034 and trended down. BNP elevated to 2500. EKG showed sinus tachycardia with lateral TWI. Echo showed EF 35-40% with mid-apical WMA c/f stress mediated cardiomyopathy. Risk factors for CAD include HTN, HLD, DM type 2, and tobacco abuse. Decision made to pursue [facility] to evaluate coronary anatomy - Keep NPO for LHC today - Continue heparin gtt for now - Continue aspirin and statin - Continue BBlocker  2. Acute combined CHF: patient with episodes of hypoxemic respiratory failure this admission, likely 2/2 IVF resuscitation in the setting of sepsis. Found to have newly reduced EF 35-40% with RWMA on echo this admission c/f stress mediated cardiomyopathy. She was diuresed with IV lasix with UOP net +471mL in the past 24 hours with at least 1 unmeasured UOP occurrence. Weight if accurate is quite labile this admission from 157lbs>179lbs>170lbs.  Cr bumped a bit to 1.38 today from 1.19 yesterday. BNP improved from 2500>1400 - Hold lasix in anticipation of R/LHC today - Continue metoprolol with plans to transition to Toprol-XL prior to discharge - Hopeful to add ARB/Entresto if Cr improves post-cath - Continue to monitor strict I&Os and daily weights - Continue to monitor electrolytes and replete as needed to maintain K>4, Mg>2  3. Sepsis with end organ dysfunction 2/2 complicated UTI: patient presented with fever. Admitted to medicine for sepsis. Procalcitonin significantly elevated though surprisingly BCx/UCx remain negative so far. She continues on IV antibiotics - Continue management per primary team  4. HTN: BP stable - Managed in the context of #2  5. HLD: LDL 59, triglyceride 168 this admission -  Continue atorvastatin - Could consider addition of fenofibrate if cath is revealing of CAD  6. DM type 2: A1C 7.5 this admission; goal <7. With frequent UTI's, addition of SGLT2-inhibitor may be contraindicated - Continue management per primary team  7. CKD stage 3a: Cr 1.38 today, up a bit from 1.19 yesterday. - Continue to monitor closely post-cath  8. Anemia/thrombocytocytosis: follows with Dr. Irene Limbo outpatient for thrombocytosis on hydroxyurea. Hgb stable at 9.8 today - Continue management per primary team  9. Tobacco abuse: reports smoking 3 cigarettes per day since age 40.  - Continue to encourage cessation.     For questions or updates, please contact [place] Please consult [email or web address] for contact info under      Signed, Abigail Butts, PA-C  07/01/2020, 9:10 AM    Personally seen and examined. Agree with APP above with the following comments: Briefly 70 yo F with NSTEMI and concerns for Stress medicated cardimopathy Patient notes that she has no further breathing issues overnight. Exam notable  for non pitting edema bilaterally. Labs notable for slight increase in creatinine and elevation in BNP Personally reviewed relevant tests; no concerning arrhythmias on telemetry Would recommend  - planned for Heart Catheterization ([place] today).  No further questions about procedure; discussed with husband as well- who clarifies that their family cardiologist is Dr. Irish Lack

## 2020-07-01 NOTE — Interval H&P Note (Signed)
History and Physical Interval Note:  07/01/2020 2:35 PM  Anna [name]  has presented today for surgery, with the diagnosis of heart failure.  The various methods of treatment have been discussed with the patient and family. After consideration of risks, benefits and other options for treatment, the patient has consented to  Procedure(s): RIGHT/LEFT HEART CATH AND CORONARY ANGIOGRAPHY (N/A)  PERCUTANEOUS CORONARY INTERVENTION  as a surgical intervention.  The patient's history has been reviewed, patient examined, no change in status, stable for surgery.  I have reviewed the patient's chart and labs.  Questions were answered to the patient's satisfaction.    Cath Lab Visit (complete for each Cath Lab visit)  Clinical Evaluation Leading to the Procedure:   ACS: Yes.    Non-ACS:    Anginal Classification: No Symptoms - NYHA CHF Sgx Class III  Anti-ischemic medical therapy: Minimal Therapy (1 class of medications)  Non-Invasive Test Results: No non-invasive testing performed; Reduced LVEF on Echo  Prior CABG: No previous CABG   Glenetta Hew

## 2020-07-01 NOTE — Progress Notes (Signed)
Patient taken down to cath lab. Husband at bedside and aware.

## 2020-07-01 NOTE — Progress Notes (Signed)
ANTICOAGULATION CONSULT NOTE - Follow Up Consult  Pharmacy Consult for Heparin Indication: chest pain/ACS  Allergies  Allergen Reactions  . Benadryl [Diphenhydramine Hcl] Other (See Comments)    chills    Patient Measurements: Height: 5\' 4"  (162.6 cm) Weight: 81.6 kg (179 lb 14.3 oz) IBW/kg (Calculated) : 54.7 Heparin Dosing Weight:    Vital Signs: Temp: 98.6 F (37 C) (04/14 0818) Temp Source: Oral (04/14 0818) BP: 134/89 (04/14 0818) Pulse Rate: 98 (04/14 0818)  Labs: Recent Labs    06/28/20 1620 06/28/20 2315 06/29/20 0246 06/29/20 0945 06/29/20 2305 06/30/20 0842 06/30/20 1810 07/01/20 0021  HGB 10.5*  --  10.3*  --  9.6*  --   --  9.8*  HCT 32.5*  --  32.6*  --  29.2*  --   --  30.6*  PLT 274  --  246  --  305  --   --  324  APTT 32  --   --   --   --   --   --   --   LABPROT 13.6  --   --   --   --   --   --   --   INR 1.1  --   --   --   --   --   --   --   HEPARINUNFRC  --   --   --    < > 0.23* 0.22* 0.32 0.51  CREATININE 1.20*  --  1.23*  --  1.19*  --   --  1.38*  TROPONINIHS  --  1,034* 932*  --  375*  --   --   --    < > = values in this interval not displayed.    Estimated Creatinine Clearance: 38.1 mL/min (A) (by C-G formula based on SCr of 1.38 mg/dL (H)).   Assessment: Anticoag: heparin for ACS. Hep level 0.52 in goal. - Hgb 9.8 down. Plts WNL  Goal of Therapy:  Heparin level 0.3-0.7 units/ml Monitor platelets by anticoagulation protocol: Yes   Plan:  Continue IV heparin at 1400 units/r F/u after cath today. Daily HL and CBC   Lani Havlik S. Alford Highland, PharmD, BCPS Clinical Staff Pharmacist [email or web address] Alford Highland, Shanikia [facility] 07/01/2020,8:40 AM

## 2020-07-01 NOTE — Progress Notes (Addendum)
PROGRESS NOTE                                                                                                                                                                                                             Patient Demographics:    Anna [name], is a 70 y.o. female, DOB - [birth year], QJF:[id number]  Outpatient Primary MD for the patient is Leanna Battles, MD    LOS - 3  Admit date - 06/28/2020    Chief Complaint  Patient presents with  . Shortness of Breath  . Weakness  . Tachycardia  . Fever       Brief Narrative (HPI from H&P) - Anna [name] is a 70 y.o. female with medical history significant for recurrent urinary tract infections, restless leg syndrome, hypertension, type 2 diabetes mellitus, stage III chronic kidney disease with baseline creatinine 1.1-1.4, presented to [facility] with high fever was diagnosed with severe sepsis due to UTI and admitted to the hospital.   Subjective:   Patient in bed, appears comfortable, denies any headache, no fever, no chest pain or pressure, no shortness of breath , no abdominal pain. No new focal weakness.   Assessment  & Plan :     1.  Severe sepsis due to current UTI.  High procalcitonin suggestive of possible bacteremia, has been placed on cefepime which will be continued, monitor cultures and procalcitonin, hydrate with IV appears nontoxic sepsis pathophysiology has likely resolved, urine and blood cultures negative so far, renal ultrasound nonacute, procalcitonin is still elevated however appears nontoxic and clinically improving on IV antibiotics which will be continued. Will need Urology follow up post DC due to multiple UTIs.  2.  Acute on chronic nonspecific CHF exacerbation night of 06/29/2020, echo shows acute on chronic systolic CHF with EF 89% along with some wall motion abnormality, cardiology on board, continue heparin drip along with aspirin,  statin and beta-blocker.  Clinically euvolemic after diuresis with IV Lasix, likely left heart cath on 07/01/2020.  3.  Non-ACS pattern elevation of troponin with sinus tachycardia.  Likely caused by sepsis creating demand mismatch, placed on aspirin, beta-blocker and statin, chest pain-free, echocardiogram and CHF as above, for now IV heparin drip has been continued, will have cardiology decide post left heart cath on 07/01/2020.  4.  Hypertension.  Placed on beta-blocker.  5.  Hypomagnesemia and hypokalemia.  Replaced.  6.  Dyslipidemia.  On statin.  7.  CKD 3A.  Baseline creatinine close to 1.4, had mild AKI resolved with hydration.  Monitor.  Hold nephrotoxins.  8.  Ongoing smoking.  Counseled to quit.       Condition -  Guarded  Family Communication  : Husband Dorothyann Peng [phone] on 06/30/2018 message left @ 9.54 am, updated bedside 06/30/2020  Code Status :  Full  Consults  :  Cards  PUD Prophylaxis : PPI   Procedures  :      Left heart cath due on 07/01/2020 -   CTA - No PE  TTE -   1. Left ventricular ejection fraction, by estimation, is 35 to 40%. Left ventricular ejection fraction by 3D volume is 37 %. The left ventricle has moderately decreased function. The left ventricle demonstrates regional wall motion abnormalities (see  scoring diagram/findings for description) in the mid and apical distribution. This can be seen in stress mediated cardiomyopathy. No LV thrombus visualized. There is mild concentric left ventricular hypertrophy. Indeterminate diastolic filling due to E-A fusion.  2. Right ventricular systolic function is normal. The right ventricular size is normal.  3. A small pericardial effusion is present.  4. The mitral valve is grossly normal. Trivial mitral valve regurgitation.  5. The aortic valve is tricuspid. There is mild calcification of the aortic valve. Aortic valve regurgitation is not visualized. Mild aortic valve sclerosis is present, with  no evidence of aortic valve stenosis.  6. The inferior vena cava is normal in size with greater than 50% respiratory variability, suggesting right atrial pressure of 3 mmHg.   Renal US - Non acute      Disposition Plan  :    Status is: Inpatient  Remains inpatient appropriate because:IV treatments appropriate due to intensity of illness or inability to take PO   Dispo: The patient is from: Home              Anticipated d/c is to: Home              Patient currently is not medically stable to d/c.   Difficult to place patient No   DVT Prophylaxis  :    Heparin   Lab Results  Component Value Date   PLT 324 07/01/2020    Diet :  Diet Order            Diet NPO time specified Except for: Sips with Meds  Diet effective midnight                  Inpatient Medications  Scheduled Meds: . aspirin  81 mg Oral Pre-Cath  . aspirin EC  81 mg Oral Daily  . atorvastatin  40 mg Oral Daily  . insulin aspart  0-9 Units Subcutaneous TID WC  . levalbuterol  0.63 mg Nebulization Once  . metoprolol tartrate  25 mg Oral BID  . pregabalin  50 mg Oral BID   Continuous Infusions: . ceFEPime (MAXIPIME) IV 2 g (07/01/20 0650)  . dextrose    . heparin 1,400 Units/hr (07/01/20 0214)   PRN Meds:.acetaminophen **OR** [DISCONTINUED] acetaminophen, melatonin, metoprolol tartrate  Antibiotics  :    Anti-infectives (From admission, onward)   Start     Dose/Rate Route Frequency Ordered Stop   06/30/20 1930  vancomycin (VANCOREADY) IVPB 1500 mg/300 mL  Status:  Discontinued        1,500 mg 150 mL/hr over 120 Minutes Intravenous  Every 48 hours 06/28/20 2004 06/28/20 2125   06/29/20 0700  ceFEPIme (MAXIPIME) 2 g in sodium chloride 0.9 % 100 mL IVPB        2 g 200 mL/hr over 30 Minutes Intravenous Every 12 hours 06/28/20 2004     06/28/20 1830  ceFEPIme (MAXIPIME) 2 g in sodium chloride 0.9 % 100 mL IVPB        2 g 200 mL/hr over 30 Minutes Intravenous  Once 06/28/20 1815 06/28/20 1918    06/28/20 1830  metroNIDAZOLE (FLAGYL) IVPB 500 mg        500 mg 100 mL/hr over 60 Minutes Intravenous  Once 06/28/20 1815 06/28/20 1944   06/28/20 1830  vancomycin (VANCOREADY) IVPB 1500 mg/300 mL        1,500 mg 150 mL/hr over 120 Minutes Intravenous  Once 06/28/20 1815 06/28/20 2123       Time Spent in minutes  30   Lala Lund M.D on 07/01/2020 at 8:24 AM  To page go to [email or web address]   [facility] -  Office  [phone]     See all Orders from today for further details    Objective:   Vitals:   06/30/20 2356 07/01/20 0400 07/01/20 0434 07/01/20 0818  BP: 134/81 134/69  134/89  Pulse: (!) 101 95  98  Resp: 20 (!) 24  (!) 23  Temp: 98.1 F (36.7 C) 98 F (36.7 C)  98.6 F (37 C)  TempSrc: Axillary Oral  Oral  SpO2: 91% 92%  94%  Weight:   81.6 kg   Height:        Wt Readings from Last 3 Encounters:  07/01/20 81.6 kg  03/02/20 69.5 kg  01/11/20 72.1 kg     Intake/Output Summary (Last 24 hours) at 07/01/2020 0824 Last data filed at 07/01/2020 0714 Gross per 24 hour  Intake 957.17 ml  Output 1100 ml  Net -142.83 ml     Physical Exam  Awake Alert, No new F.N deficits, Normal affect [place].AT,PERRAL Supple Neck,No JVD, No cervical lymphadenopathy appriciated.  Symmetrical Chest wall movement, Good air movement bilaterally, CTAB RRR,No Gallops, Rubs or new Murmurs, No Parasternal Heave +ve B.Sounds, Abd Soft, No tenderness, No organomegaly appriciated, No rebound - guarding or rigidity. No Cyanosis, Clubbing or edema, No new Rash or bruise    Data Review:    CBC Recent Labs  Lab 06/28/20 1620 06/29/20 0246 06/29/20 2305 07/01/20 0021  WBC 15.1* 15.8* 15.5* 9.5  HGB 10.5* 10.3* 9.6* 9.8*  HCT 32.5* 32.6* 29.2* 30.6*  PLT 274 246 305 324  MCV 101.2* 100.6* 99.0 101.0*  MCH 32.7 31.8 32.5 32.3  MCHC 32.3 31.6 32.9 32.0  RDW 18.2* 18.5* 18.4* 18.5*  LYMPHSABS 0.7 1.3 1.2 2.0  MONOABS 0.6 1.1* 1.1* 1.0  EOSABS 0.0 0.0 0.0 0.1  BASOSABS  0.1 0.1 0.1 0.1    Recent Labs  Lab 06/28/20 1620 06/28/20 2001 06/28/20 2315 06/29/20 0246 06/29/20 2305 07/01/20 0021  NA 134*  --   --  136 137 137  K 4.1  --   --  3.8 3.3* 3.8  CL 105  --   --  105 105 103  CO2 19*  --   --  20* 21* 26  GLUCOSE 310*  --   --  220* 212* 235*  BUN 24*  --   --  22 23 28*  CREATININE 1.20*  --   --  1.23* 1.19* 1.38*  CALCIUM 8.6*  --   --  8.7* 8.9 8.8*  AST 18  --   --  17 17 24   ALT 13  --   --  15 16 19   ALKPHOS 78  --   --  71 71 83  BILITOT 1.1  --   --  0.8 0.8 0.5  ALBUMIN 2.8*  --   --  2.7* 2.7* 2.7*  MG  --   --  1.4* 1.5* 2.0 2.0  PROCALCITON  --   --  6.95  --  9.73 8.12  LATICACIDVEN 3.3* 2.5*  --  1.6  --   --   INR 1.1  --   --   --   --   --   TSH  --   --   --  1.099  --   --   HGBA1C  --   --   --  7.5*  --   --   BNP  --   --   --   --  2,548.4* 1,430.1*    ------------------------------------------------------------------------------------------------------------------ No results for input(s): CHOL, HDL, LDLCALC, TRIG, CHOLHDL, LDLDIRECT in the last 72 hours.  Lab Results  Component Value Date   HGBA1C 7.5 (H) 06/29/2020   ------------------------------------------------------------------------------------------------------------------ Recent Labs    06/29/20 0246  TSH 1.099    Cardiac Enzymes No results for input(s): CKMB, TROPONINI, MYOGLOBIN in the last 168 hours.  Invalid input(s): CK ------------------------------------------------------------------------------------------------------------------    Component Value Date/Time   BNP 1,430.1 (H) 07/01/2020 0021    Micro Results Recent Results (from the past 240 hour(s))  Resp Panel by RT-PCR (Flu A&B, Covid) Nasopharyngeal Swab     Status: None   Collection Time: 06/28/20  4:20 PM   Specimen: Nasopharyngeal Swab; Nasopharyngeal(NP) swabs in vial transport medium  Result Value Ref Range Status   SARS Coronavirus 2 by RT PCR NEGATIVE NEGATIVE Final     Comment: (NOTE) SARS-CoV-2 target nucleic acids are NOT DETECTED.  The SARS-CoV-2 RNA is generally detectable in upper respiratory specimens during the acute phase of infection. The lowest concentration of SARS-CoV-2 viral copies this assay can detect is 138 copies/mL. A negative result does not preclude SARS-Cov-2 infection and should not be used as the sole basis for treatment or other patient management decisions. A negative result may occur with  improper specimen collection/handling, submission of specimen other than nasopharyngeal swab, presence of viral mutation(s) within the areas targeted by this assay, and inadequate number of viral copies(<138 copies/mL). A negative result must be combined with clinical observations, patient history, and epidemiological information. The expected result is Negative.  Fact Sheet for Patients:  EntrepreneurPulse.com.au  Fact Sheet for Healthcare Providers:  IncredibleEmployment.be  This test is no t yet approved or cleared by the [place] FDA and  has been authorized for detection and/or diagnosis of SARS-CoV-2 by FDA under an Emergency Use Authorization (EUA). This EUA will remain  in effect (meaning this test can be used) for the duration of the COVID-19 declaration under Section 564(b)(1) of the Act, 21 U.S.C.section 360bbb-3(b)(1), unless the authorization is terminated  or revoked sooner.       Influenza A by PCR NEGATIVE NEGATIVE Final   Influenza B by PCR NEGATIVE NEGATIVE Final    Comment: (NOTE) The Xpert Xpress SARS-CoV-2/FLU/RSV plus assay is intended as an aid in the diagnosis of influenza from Nasopharyngeal swab specimens and should not be used as a sole basis for treatment. Nasal washings and aspirates are unacceptable for Xpert Xpress SARS-CoV-2/FLU/RSV testing.  Fact Sheet for Patients:  EntrepreneurPulse.com.au  Fact Sheet for Healthcare  Providers: IncredibleEmployment.be  This test is not yet approved or cleared by the [place] FDA and has been authorized for detection and/or diagnosis of SARS-CoV-2 by FDA under an Emergency Use Authorization (EUA). This EUA will remain in effect (meaning this test can be used) for the duration of the COVID-19 declaration under Section 564(b)(1) of the Act, 21 U.S.C. section 360bbb-3(b)(1), unless the authorization is terminated or revoked.  Performed at [facility], [place] [address] [place], [place] [address]   Blood culture (routine single)     Status: None (Preliminary result)   Collection Time: 06/28/20  4:23 PM   Specimen: BLOOD  Result Value Ref Range Status   Specimen Description BLOOD SITE NOT SPECIFIED  Final   Special Requests   Final    BOTTLES DRAWN AEROBIC AND ANAEROBIC Blood Culture results may not be optimal due to an inadequate volume of blood received in culture bottles   Culture   Final    NO GROWTH 3 DAYS Performed at [facility], [place] [address] [place], [place] [address]    Report Status PENDING  Incomplete  Urine culture     Status: None   Collection Time: 06/28/20  8:50 PM   Specimen: In/Out Cath Urine  Result Value Ref Range Status   Specimen Description IN/OUT CATH URINE  Final   Special Requests NONE  Final   Culture   Final    NO GROWTH Performed at [facility], [place] [address], [place], [place] [address]    Report Status 06/30/2020 FINAL  Final  MRSA PCR Screening     Status: None   Collection Time: 06/29/20  6:58 AM   Specimen: Nasal Mucosa; Nasopharyngeal  Result Value Ref Range Status   MRSA by PCR NEGATIVE NEGATIVE Final    Comment:        The GeneXpert MRSA Assay (FDA approved for NASAL specimens only), is one component of a comprehensive MRSA colonization surveillance program. It is not intended to diagnose MRSA infection nor to guide or monitor treatment for MRSA infections. Performed at  [facility], [place] [address] [place],  [address]     Radiology Reports  CT Angio Chest PE W/Cm &/Or Wo Cm  Result Date: 06/28/2020 CLINICAL DATA:  PE suspected, high prob Shortness of breath.  Weakness.  Tachycardia. EXAM: CT ANGIOGRAPHY CHEST WITH CONTRAST TECHNIQUE: Multidetector CT imaging of the chest was performed using the standard protocol during bolus administration of intravenous contrast. Multiplanar CT image reconstructions and MIPs were obtained to evaluate the vascular anatomy. CONTRAST:  33mL OMNIPAQUE IOHEXOL 350 MG/ML SOLN COMPARISON:  Radiograph earlier this day.  Chest CTA 01/14/2020 FINDINGS: Cardiovascular: There are no filling defects within the pulmonary arteries to suggest pulmonary embolus. Aortic atherosclerosis. Conventional branching pattern from the aortic arch. No evidence of dissection or acute aortic findings. Normal heart size. Small amount of pericardial fluid anteriorly is similar to prior exam. Coronary artery calcifications. Mediastinum/Nodes: No enlarged mediastinal or hilar lymph nodes. No thyroid nodule. No esophageal wall thickening. Lungs/Pleura: Linear right upper lobe scarring is unchanged from prior exam. Previous pleural effusions have resolved. Previous septal thickening has resolved. No acute airspace disease. Trachea and central bronchi are patent. Mild central bronchial thickening. Upper Abdomen: No acute findings. Vascular calcifications. Suspected hepatic steatosis. Musculoskeletal: There are no acute or suspicious osseous abnormalities. Review of the MIP images confirms the above findings. IMPRESSION: 1. No pulmonary embolus. 2.  Mild central bronchial thickening, can be seen with bronchitis or reactive airways disease. 3. Right upper lobe scarring. 4. Aortic atherosclerosis.  Coronary artery calcifications. Aortic Atherosclerosis (ICD10-I70.0). Electronically Signed   By: Keith Rake M.D.   On: 06/28/2020 19:45     DG Chest Port 1  View  Result Date: 06/28/2020 CLINICAL DATA:  Fever and shortness of breath. EXAM: PORTABLE CHEST 1 VIEW COMPARISON:  June 28, 2020 (4:44 p.m.) FINDINGS: Decreased lung volumes are again seen with mild diffusely increased interstitial lung markings. This represents a new finding when compared to the prior study. Mild areas of atelectasis and/or infiltrate are seen within the bilateral infrahilar regions and medial aspect of the upper left lung. There is no evidence of a pleural effusion or pneumothorax. The heart size and mediastinal contours are within normal limits. The visualized skeletal structures are unremarkable. IMPRESSION: 1. Interval development of mild left upper lobe and bilateral infrahilar atelectasis and/or infiltrate since the prior study. 2. Additional findings consistent with mild interstitial edema. Electronically Signed   By: Virgina Norfolk M.D.   On: 06/28/2020 22:29   DG Chest Port 1 View  Result Date: 06/28/2020 CLINICAL DATA:  Questionable sepsis, fever, shortness of breath, weakness, tachycardia EXAM: PORTABLE CHEST 1 VIEW COMPARISON:  01/13/2020 FINDINGS: Cardiomegaly. Low volume AP portable examination without acute airspace opacity. The visualized skeletal structures are unremarkable. IMPRESSION: Cardiomegaly without acute abnormality of the lungs in AP portable projection. Electronically Signed   By: Eddie Candle M.D.   On: 06/28/2020 16:56

## 2020-07-02 ENCOUNTER — Encounter (HOSPITAL_COMMUNITY): Payer: Self-pay | Admitting: Cardiology

## 2020-07-02 DIAGNOSIS — A419 Sepsis, unspecified organism: Secondary | ICD-10-CM | POA: Diagnosis not present

## 2020-07-02 DIAGNOSIS — R6520 Severe sepsis without septic shock: Secondary | ICD-10-CM | POA: Diagnosis not present

## 2020-07-02 LAB — COMPREHENSIVE METABOLIC PANEL
ALT: 17 U/L (ref 0–44)
AST: 17 U/L (ref 15–41)
Albumin: 2.7 g/dL — ABNORMAL LOW (ref 3.5–5.0)
Alkaline Phosphatase: 71 U/L (ref 38–126)
Anion gap: 9 (ref 5–15)
BUN: 22 mg/dL (ref 8–23)
CO2: 22 mmol/L (ref 22–32)
Calcium: 9.2 mg/dL (ref 8.9–10.3)
Chloride: 102 mmol/L (ref 98–111)
Creatinine, Ser: 1.17 mg/dL — ABNORMAL HIGH (ref 0.44–1.00)
GFR, Estimated: 50 mL/min — ABNORMAL LOW (ref >60)
Glucose, Bld: 177 mg/dL — ABNORMAL HIGH (ref 70–99)
Potassium: 3.5 mmol/L (ref 3.5–5.1)
Sodium: 133 mmol/L — ABNORMAL LOW (ref 135–145)
Total Bilirubin: 0.6 mg/dL (ref 0.3–1.2)
Total Protein: 6.6 g/dL (ref 6.5–8.1)

## 2020-07-02 LAB — CBC WITH DIFFERENTIAL/PLATELET
Abs Immature Granulocytes: 0.08 10*3/uL — ABNORMAL HIGH (ref 0.00–0.07)
Basophils Absolute: 0.1 10*3/uL (ref 0.0–0.1)
Basophils Relative: 1 %
Eosinophils Absolute: 0.1 10*3/uL (ref 0.0–0.5)
Eosinophils Relative: 1 %
HCT: 31 % — ABNORMAL LOW (ref 36.0–46.0)
Hemoglobin: 10.1 g/dL — ABNORMAL LOW (ref 12.0–15.0)
Immature Granulocytes: 1 %
Lymphocytes Relative: 18 %
Lymphs Abs: 1.5 10*3/uL (ref 0.7–4.0)
MCH: 32.6 pg (ref 26.0–34.0)
MCHC: 32.6 g/dL (ref 30.0–36.0)
MCV: 100 fL (ref 80.0–100.0)
Monocytes Absolute: 1.1 10*3/uL — ABNORMAL HIGH (ref 0.1–1.0)
Monocytes Relative: 12 %
Neutro Abs: 5.7 10*3/uL (ref 1.7–7.7)
Neutrophils Relative %: 67 %
Platelets: 355 10*3/uL (ref 150–400)
RBC: 3.1 MIL/uL — ABNORMAL LOW (ref 3.87–5.11)
RDW: 18.6 % — ABNORMAL HIGH (ref 11.5–15.5)
WBC: 8.5 10*3/uL (ref 4.0–10.5)
nRBC: 0 % (ref 0.0–0.2)

## 2020-07-02 LAB — MAGNESIUM: Magnesium: 1.9 mg/dL (ref 1.7–2.4)

## 2020-07-02 LAB — GLUCOSE, CAPILLARY
Glucose-Capillary: 172 mg/dL — ABNORMAL HIGH (ref 70–99)
Glucose-Capillary: 349 mg/dL — ABNORMAL HIGH (ref 70–99)
Glucose-Capillary: 231 mg/dL — ABNORMAL HIGH (ref 70–99)
Glucose-Capillary: 172 mg/dL — ABNORMAL HIGH (ref 70–99)

## 2020-07-02 LAB — BRAIN NATRIURETIC PEPTIDE: B Natriuretic Peptide: 1639.1 pg/mL — ABNORMAL HIGH (ref 0.0–100.0)

## 2020-07-02 LAB — PROCALCITONIN: Procalcitonin: 4.48 ng/mL

## 2020-07-02 MED ORDER — TICAGRELOR 90 MG PO TABS
180.0000 mg | ORAL_TABLET | Freq: Once | ORAL | Status: AC
  Administered 2020-07-02: 180 mg via ORAL
  Filled 2020-07-02: qty 2

## 2020-07-02 MED ORDER — ROPINIROLE HCL 0.5 MG PO TABS
0.5000 mg | ORAL_TABLET | Freq: Two times a day (BID) | ORAL | Status: DC
  Administered 2020-07-02: 0.5 mg via ORAL
  Filled 2020-07-02 (×2): qty 1

## 2020-07-02 MED ORDER — MAGNESIUM SULFATE IN D5W 1-5 GM/100ML-% IV SOLN
1.0000 g | Freq: Once | INTRAVENOUS | Status: AC
  Administered 2020-07-02: 1 g via INTRAVENOUS
  Filled 2020-07-02: qty 100

## 2020-07-02 MED ORDER — TICAGRELOR 90 MG PO TABS
90.0000 mg | ORAL_TABLET | Freq: Two times a day (BID) | ORAL | Status: DC
  Administered 2020-07-02 – 2020-07-06 (×8): 90 mg via ORAL
  Filled 2020-07-02 (×8): qty 1

## 2020-07-02 MED ORDER — FENOFIBRATE 160 MG PO TABS
160.0000 mg | ORAL_TABLET | Freq: Every day | ORAL | Status: DC
  Administered 2020-07-02 – 2020-07-06 (×5): 160 mg via ORAL
  Filled 2020-07-02 (×5): qty 1

## 2020-07-02 MED ORDER — POTASSIUM CHLORIDE CRYS ER 20 MEQ PO TBCR
40.0000 meq | EXTENDED_RELEASE_TABLET | Freq: Once | ORAL | Status: AC
  Administered 2020-07-02: 40 meq via ORAL
  Filled 2020-07-02: qty 2

## 2020-07-02 MED FILL — Verapamil HCl IV Soln 2.5 MG/ML: INTRAVENOUS | Qty: 2 | Status: AC

## 2020-07-02 NOTE — Progress Notes (Signed)
Pharmacy Antibiotic Note  MARGI EDMUNDSON is a 70 y.o. female with sepsis due to UTI Pharmacy has been consulted for cefepime dosing. He started cefepime on 4/11 and plans noted for 5 days of therapy. -WBC= 8.5, afeb, SCr= 1.1, CrCl ~ 45 -cultures- ngtd   Plan: -Today is day 5 of cefepime, discontinue after the last dose today -Will sign off. Please contact pharmacy with any other needs.   Height: 5\' 4"  (162.6 cm) Weight: 77.5 kg (170 lb 14.4 oz) IBW/kg (Calculated) : 54.7  Temp (24hrs), Avg:98.1 F (36.7 C), Min:97.9 F (36.6 C), Max:98.5 F (36.9 C)  Recent Labs  Lab 06/28/20 1620 06/28/20 2001 06/29/20 0246 06/29/20 2305 07/01/20 0021 07/01/20 1839 07/02/20 0340  WBC 15.1*  --  15.8* 15.5* 9.5 11.2* 8.5  CREATININE 1.20*  --  1.23* 1.19* 1.38* 1.10* 1.17*  LATICACIDVEN 3.3* 2.5* 1.6  --   --   --   --     Estimated Creatinine Clearance: 43.8 mL/min (A) (by C-G formula based on SCr of 1.17 mg/dL (H)).    Allergies  Allergen Reactions  . Benadryl [Diphenhydramine Hcl] Other (See Comments)    chills     Thank you for allowing pharmacy to be a part of this patient's care.  Hildred Laser, PharmD Clinical Pharmacist **Pharmacist phone directory can now be found on [email or web address] ([email or web address]).  Listed under [place].

## 2020-07-02 NOTE — Progress Notes (Signed)
Heart Failure Nurse Navigator Progress Note  PCP: Leanna Battles, MD PCP-Cardiologist: none on file Admission Diagnosis: sepsis d/t UTI Admitted from: home with spouse  Presentation:   Anna [name] presented with SOB and UTI. Pt resting in bed with eyes closed, RN at bedside. Pt does not have legal guardian--spouse is POA. Pt interactive with interview process. 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 Spouse came in and asked questions toward the end of the interview process.  ECHO/ LVEF: 35-40%  Clinical Course:  Past Medical History:  Diagnosis Date  . Arthritis   . Chronic kidney disease    kidney stone  . Diabetes mellitus without complication (HCC)    on Metformin  . Fibromyalgia   . History of kidney stones   . Hypertension   . Insomnia   . 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 Pain in limb   . Psoriasis   . RLS (restless legs syndrome)   . Varicose veins      Social History   Socioeconomic History  . Marital status: Married    Spouse name: Anahy Esh  . Number of children: 2  . Years of education: 12 +  . Highest education level: Associate degree: occupational, Hotel manager, or vocational program  Occupational History  . Occupation: retired  Tobacco Use  . Smoking status: Current Every Day Smoker    Packs/day: 0.15    Years: 56.00    Pack years: 8.40    Types: Cigarettes  . Smokeless tobacco: Never Used  . Tobacco comment: smokes 3/day.   Vaping Use  . Vaping Use: Never used  Substance and Sexual Activity  . Alcohol use: Never  . Drug use: No  . Sexual activity: Not on file  Other Topics Concern  . Not on file  Social History Narrative   ** Merged History Encounter **       Lives with husband No caffeine   Social Determinants of Health   Financial Resource Strain: Low Risk   . Difficulty of Paying Living Expenses: Not very hard  Food Insecurity: No Food Insecurity  . Worried About Charity fundraiser  in the Last Year: Never true  . Ran Out of Food in the Last Year: Never true  Transportation Needs: No Transportation Needs  . Lack of Transportation (Medical): No  . Lack of Transportation (Non-Medical): No  Physical Activity: Not on file  Stress: Not on file  Social Connections: Not on file    High Risk Criteria for Readmission and/or Poor Patient Outcomes:  Heart failure hospital admissions (last 6 months): 1   No Show rate: 3%  Difficult social situation: no  Demonstrates medication adherence: yes  Primary Language: English  Literacy level: able to read/write and comprehend  Education Assessment and Provision:  Detailed education and instructions provided on heart failure disease management including the following:  Signs and symptoms of Heart Failure When to call the physician Importance of daily weights Low sodium diet Fluid restriction Medication management Anticipated future follow-up appointments  Patient education given on each of the above topics.  Patient acknowledges understanding via teach back method and acceptance of all instructions.  Education Materials:  "Living Better With Heart Failure" Booklet, HF zone tool, & Daily Weight Tracker Tool.  Patient has scale at home: yes Patient has pill box at home: yes, several   Barriers of Care:   -new HF diagnosis  Considerations/Referrals:   Referral made  to Heart Failure Pharmacist Stewardship: yes, appreciated Referral made to Heart & Vascular TOC clinic: yes, April 21 @ 2pm--pending to change depending on cardiology/cath potential Monday 4/18.  Items for Follow-up on DC/TOC: -cardiology (new, Dr. Gasper Sells) -medication optimization -medication regimen/cost -continued HF education -diet/fluid restictions  Pricilla Holm, RN, BSN Heart Failure Nurse Navigator [phone]

## 2020-07-02 NOTE — Progress Notes (Addendum)
Progress Note  Patient Name: Anna [name] Date of Encounter: 07/02/2020  [facility] Cardiologist: Werner Lean, MD   Subjective   Patient states she had a good experience at cath lab yesterday, she was treated well and got a good explanation of her heart disease. She feels little more SOB today, denied any chest pain.  She is frustrated that her grandson can't come visit her and she has to stay for Easter' weekend. She understands the need for hospitalization for her cardiac problems.   Inpatient Medications    Scheduled Meds:  aspirin EC  81 mg Oral Daily   atorvastatin  40 mg Oral Daily   fenofibrate  160 mg Oral Daily   furosemide  40 mg Intravenous BID   heparin  5,000 Units Subcutaneous Q8H   insulin aspart  0-9 Units Subcutaneous TID WC   metoprolol tartrate  25 mg Oral BID   pregabalin  50 mg Oral BID   sodium chloride flush  3 mL Intravenous Q12H   ticagrelor  180 mg Oral Once   ticagrelor  90 mg Oral BID   Continuous Infusions:  sodium chloride     ceFEPime (MAXIPIME) IV 2 g (07/02/20 0823)   PRN Meds: sodium chloride, acetaminophen **OR** [DISCONTINUED] acetaminophen, melatonin, metoprolol tartrate, sodium chloride flush   Vital Signs    Vitals:   07/02/20 0700 07/02/20 0731 07/02/20 0800 07/02/20 0830  BP: 140/72 131/74 129/64 124/66  Pulse: 89 94 93 94  Resp:      Temp:  98 F (36.7 C)    TempSrc:  Oral    SpO2: 93% 97% 95% 97%  Weight:      Height:        Intake/Output Summary (Last 24 hours) at 07/02/2020 1033 Last data filed at 07/02/2020 0601 Gross per 24 hour  Intake 753.88 ml  Output 450 ml  Net 303.88 ml   Last 3 Weights 07/01/2020 07/01/2020 06/30/2020  Weight (lbs) 170 lb 14.4 oz 179 lb 14.3 oz 173 lb 1 oz  Weight (kg) 77.52 kg 81.6 kg 78.5 kg      Telemetry    Sinus rhythm with rate of 70-90s, artifacts noted - Personally Reviewed  ECG    No new tracing today- Personally Reviewed  Physical Exam   GEN: No acute  distress.  Speaks in full sentence.  Neck: No JVD Cardiac: RRR, no murmurs, rubs, or gallops.  Respiratory: Fine crackles noted to auscultation bilaterally. On room air. No use accessory muscle GI: Soft, nontender, non-distended  MS: No BLE edema; No deformity. Neuro:  Nonfocal  Psych: Anxious  GU: suction Foley with clear yellow urine   Labs    High Sensitivity Troponin:   Recent Labs  Lab 06/28/20 2315 06/29/20 0246 06/29/20 2305  TROPONINIHS 1,034* 932* 375*      Chemistry Recent Labs  Lab 06/29/20 2305 07/01/20 0021 07/01/20 1516 07/01/20 1524 07/01/20 1528 07/01/20 1839 07/02/20 0340  NA 137 137   < > 140 138  --  133*  K 3.3* 3.8   < > 3.6 3.8  --  3.5  CL 105 103  --   --   --   --  102  CO2 21* 26  --   --   --   --  22  GLUCOSE 212* 235*  --   --   --   --  177*  BUN 23 28*  --   --   --   --  22  CREATININE 1.19* 1.38*  --   --   --  1.10* 1.17*  CALCIUM 8.9 8.8*  --   --   --   --  9.2  PROT 6.3* 6.5  --   --   --   --  6.6  ALBUMIN 2.7* 2.7*  --   --   --   --  2.7*  AST 17 24  --   --   --   --  17  ALT 16 19  --   --   --   --  17  ALKPHOS 71 83  --   --   --   --  71  BILITOT 0.8 0.5  --   --   --   --  0.6  GFRNONAA 49* 41*  --   --   --  53* 50*  ANIONGAP 11 8  --   --   --   --  9   < > = values in this interval not displayed.     Hematology Recent Labs  Lab 07/01/20 0021 07/01/20 1516 07/01/20 1528 07/01/20 1839 07/02/20 0340  WBC 9.5  --   --  11.2* 8.5  RBC 3.03*  --   --  3.31* 3.10*  HGB 9.8*   < > 8.5* 10.6* 10.1*  HCT 30.6*   < > 25.0* 31.9* 31.0*  MCV 101.0*  --   --  96.4 100.0  MCH 32.3  --   --  32.0 32.6  MCHC 32.0  --   --  33.2 32.6  RDW 18.5*  --   --  18.5* 18.6*  PLT 324  --   --  244 355   < > = values in this interval not displayed.    BNP Recent Labs  Lab 06/29/20 2305 07/01/20 0021 07/02/20 0340  BNP 2,548.4* 1,430.1* 1,639.1*     DDimer No results for input(s): DDIMER in the last 168 hours.    Radiology    CARDIAC CATHETERIZATION  Result Date: 07/01/2020  Ost LAD to Prox LAD lesion is 60% stenosed. Prox LAD to Mid LAD lesion is 70% stenosed with 50% stenosed side branch in 1st Sept.  Mid LAD lesion is 70% stenosed.  Prox Cx to Mid Cx lesion is 40% stenosed with 55% stenosed side branch in 1st Mrg.  2nd Mrg lesion is 80% stenosed.  LV end diastolic pressure is severely elevated.  Hemodynamic findings consistent with mild pulmonary hypertension.  Hemodynamic findings are consistent with severe cardiomyopathy  Hemodynamics consistent ACUTE COMBINED SYSTOLIC AND DIASTOLIC HEART FAILURE  SUMMARY  Severe two-vessel disease (codominant system);  extensive LAD disease: Ostial (60%), proximal (segmental 70%)  and focal mid 70% just after major 1st Diag branch  Focal eccentric 80% 2nd Mrg  Mild Secondary Pulmonary Hypertension with mean PAP of 26 mmHg, PCWP and LVEDP of 26 and 27 mmHg.  RVEDP and RAP 25mmHg  Moderate to severely reduced cardiac Output-Index: (Fick) 4.45-2.38; Thermodilution's 2.96-1.58.  Findings consistent with ACUTE COMBINED SYSTOLIC AND DIASTOLIC HEART FAILURE with likely ischemic contribution, but EF out of portion due to existing CAD. RECOMMENDATIONS:  The patient be transferred to cardiac unit for post-cath care.  As per discussion with Dr. Gasper Sells -> we will diurese starting with 40 mg IV Lasix in the Cath Lab followed by 40 mg IV twice daily starting tomorrow.  Initiate GDMT for ICM and load with Brilinta over the weekend  Once stable from a volume and renal  function standpoint, would anticipate staged PCI of the LAD and OM 2 early next week. Glenetta Hew, MD    Cardiac Studies   Echo from 06/29/20:    1. Left ventricular ejection fraction, by estimation, is 35 to 40%. Left  ventricular ejection fraction by 3D volume is 37 %. The left ventricle has  moderately decreased function. The left ventricle demonstrates regional  wall motion abnormalities  (see  scoring diagram/findings for description) in the mid and apical  distribution. This can be seen in stress mediated cardiomyopathy. No LV  thrombus visualized. There is mild concentric left ventricular  hypertrophy. Indeterminate diastolic filling due to E-A   fusion.   2. Right ventricular systolic function is normal. The right ventricular  size is normal.   3. A small pericardial effusion is present.   4. The mitral valve is grossly normal. Trivial mitral valve  regurgitation.   5. The aortic valve is tricuspid. There is mild calcification of the  aortic valve. Aortic valve regurgitation is not visualized. Mild aortic  valve sclerosis is present, with no evidence of aortic valve stenosis.   6. The inferior vena cava is normal in size with greater than 50%  respiratory variability, suggesting right atrial pressure of 3 mmHg.    Left heart catheterization 07/01/20: Ost LAD to Prox LAD lesion is 60% stenosed. Prox LAD to Mid LAD lesion is 70% stenosed with 50% stenosed side branch in 1st Sept. Mid LAD lesion is 70% stenosed. Prox Cx to Mid Cx lesion is 40% stenosed with 55% stenosed side branch in 1st Mrg. 2nd Mrg lesion is 80% stenosed. LV end diastolic pressure is severely elevated. Hemodynamic findings consistent with mild pulmonary hypertension. Hemodynamic findings are consistent with severe cardiomyopathy Hemodynamics consistent ACUTE COMBINED SYSTOLIC AND DIASTOLIC HEART FAILURE   SUMMARY Severe two-vessel disease (codominant system); extensive LAD disease: Ostial (60%), proximal (segmental 70%)  and focal mid 70% just after major 1st Diag branch Focal eccentric 80% 2nd Mrg   Mild Secondary Pulmonary Hypertension with mean PAP of 26 mmHg, PCWP and LVEDP of 26 and 27 mmHg.  RVEDP and RAP 12mmHg Moderate to severely reduced cardiac Output-Index: (Fick) 4.45-2.38; Thermodilution's 2.96-1.58.   Findings consistent with ACUTE COMBINED SYSTOLIC AND DIASTOLIC HEART FAILURE with  likely ischemic contribution, but EF out of portion due to existing CAD.     RECOMMENDATIONS: The patient be transferred to cardiac unit for post-cath care. As per discussion with Dr. Gasper Sells -> we will diurese starting with 40 mg IV Lasix in the Cath Lab followed by 40 mg IV twice daily starting tomorrow. Initiate GDMT for ICM and load with Brilinta over the weekend Once stable from a volume and renal function standpoint, would anticipate staged PCI of the LAD and OM 2 early next week.   Diagnostic Dominance: Co-dominant      Patient Profile      70 y.o. female with PMH of frequent UTI, HTN, DM type 2, CKD stage III, hyperlipidemia, peripheral neuropathy, chronic back pain, arthritis, essential thrombocytosis,  restless leg syndrome, who is currently admitted for sepsis due to UTI. Cardiology is following for elevated troponin, dyspnea, and volume overload. Cath revealing severe 2 vessel CAD, plan for staged PCI.     Assessment & Plan    1. NSTEMI, new diagnosed CAD: patient presented with sepsis with episodes of acute hypoxic respiratory failure this admission following IVF resuscitation. HsTrop peaked at 1034 and trended down. BNP elevated to 2500. EKG showed sinus tachycardia with lateral TWI.  Echo showed EF 35-40% with mid-apical WMA c/f stress mediated cardiomyopathy. LHC 4/14 showed severe 2-vessel CAD extensive LAD disease: Ostial (60%), proximal (segmental 70%)  and focal mid 70% just after major 1st Diag branch; Focal eccentric 80% 2nd Mrg. Mild Secondary Pulmonary Hypertension with mean PAP of 26 mmHg, PCWP and LVEDP of 26 and 27 mmHg.  RVEDP and RAP 71mmHg. Moderate to severely reduced cardiac Output-Index: (Fick) 4.45-2.38; Thermodilution's 2.96-1.58. - Plan for staged PCI of the LAD and OM 2 early next week,  diuresis to euvolemia and Brilinta load over the weekend - heparin gtt stopped post cath,  low threshold to resume heparin gtt if chest pain or ACS equivalent  symptoms occur  - Continue aspirin, statin, Bblocker  - start Brilinta loading: 180mg  x1 and then 90mg  BID after  - start IV Lasix 40mg  BID   2. Acute combined CHF: patient with episodes of hypoxemic respiratory failure this admission, likely 2/2 IVF resuscitation in the setting of sepsis. Found to have newly reduced EF 35-40% with RWMA on echo this admission c/f stress mediated cardiomyopathy. She was diuresed with intermittent bolus of  IV lasix with UOP net -253ml in the past 24 hours. Weight if accurate is quite labile past 24 hours, 157lbs>179lbs>170lbs.  Cr bumped to 1.38 on 07/01/20 prior to cath but now improving to 1.1>1.17 while holding lasix . BNP fluctuate 2500>1400>1639.  - lasix held prior to cath due to rising Cr, will resume IV lasix 40mg  BID today - Continue metoprolol with plans to transition to Toprol-XL prior to discharge - Hopeful to add ARB/Entresto if Cr improves post-cath - Continue to monitor strict I&Os and daily weights - Continue to monitor electrolytes and replete as needed to maintain K>4, Mg>2   3. Sepsis with end organ dysfunction 2/2 complicated UTI: patient presented with fever. Admitted to medicine for sepsis. Procalcitonin significantly elevated though surprisingly BCx/UCx remain negative so far. She continues on IV cefepime  - management per primary team   4. HTN: BP stable - Managed in the context of #2   5. HLD: LDL 59, triglyceride 168 this admission - Continue atorvastatin - will start fenofibrate 160mg  since cath revealing CAD   6. DM type 2: A1C 7.5 this admission; goal <7. With frequent UTI's, addition of SGLT2-inhibitor may be contraindicated - Continue management per primary team   7. CKD stage 3a: Cr 1.17 today, overall stable near her baseline  - Continue to monitor closely post-cath while diuresing    8. Anemia/thrombocytocytosis: follows with Dr. Irene Limbo outpatient for thrombocytosis on hydroxyurea. Hgb stable at 10.1 and PLT 355k today,  hydroxyurea on hold per primary team  - Continue management per primary team   9. Tobacco abuse: reports smoking 3 cigarettes per day since age 60.  - Continue to encourage cessation      For questions or updates, please contact [place] Please consult [email or web address] for contact info under        Signed, Margie Billet, NP  07/02/2020, 10:33 AM     Personally seen and examined. Agree with APP above with the following comments: Briefly 70 yo F with HTN, DM, Tobacco Abuse with NSTEMI and new HFrEF in the setting of sepsis Patient notes CP with stress post cath.  Notes her sugars were elevated and attributes this to her cranberry juice Exam notable for R radial without bruit or hematoma.  Distally fingers are slightly cool to touch but stable from prior; patient has what appear to be acrylic nails on.  Labs notable for increase in creatinine Personally reviewed relevant tests; Reviewed cath PM 07/01/20:  lesions appear amenable to PCI Would recommend the above and - DAPT load - Changed to diabetic heart healthy diet - Continue diuresis - patient pre-contemplative about smoking cessation - given CP willing to stay for PCI - tentatively schedule for PCI Monday if improving with GDMT and diuresis

## 2020-07-02 NOTE — Progress Notes (Signed)
Physical Therapy Treatment Patient Details Name: Anna [name] MRN: [id number] DOB: [birth year] Today's Date: 07/02/2020    History of Present Illness Pt is a 70 y/o female admitted 4/11 secondary to sepsis from  UTI. PMH includes DM, HTN, and chronic back pain s/p surgery.    PT Comments    Pt reports to be tired, but is agreeable to participating in therapy. Pt demonstrates limitations in activity tolerance secondary to pain of her R LE as well as fatigue. Pt is able to perform bed mobility and transfers with little physical assistance, but relies on B UE for gait activities and is limited to short distances due to endurance deficits. Pt will continue to benefit from acute PT to improve functional independence, activity tolerance, strength, and overall safety with mobility.   Follow Up Recommendations  Outpatient PT     Equipment Recommendations  Rolling walker with 5" wheels    Recommendations for Other Services       Precautions / Restrictions Precautions Precautions: Fall Restrictions Weight Bearing Restrictions: No    Mobility  Bed Mobility Overal bed mobility: Modified Independent (HOB elevated uses handrail) Bed Mobility: Supine to Sit     Supine to sit: Modified independent (Device/Increase time);HOB elevated          Transfers Overall transfer level: Needs assistance Equipment used: Rolling walker (2 wheeled) Transfers: Sit to/from Stand Sit to Stand: Min guard            Ambulation/Gait Ambulation/Gait assistance: Min guard Gait Distance (Feet): 60 Feet Assistive device: Rolling walker (2 wheeled) Gait Pattern/deviations: Step-to pattern;Trunk flexed (Corrects posture with VC) Gait velocity: reduced Gait velocity interpretation: 1.31 - 2.62 ft/sec, indicative of limited community ambulator General Gait Details: Reports needing to sit down after gait.   Stairs             Wheelchair Mobility    Modified Rankin (Stroke Patients Only)        Balance Overall balance assessment: Needs assistance Sitting-balance support: No upper extremity supported;Feet supported Sitting balance-Leahy Scale: Good (Pt able to don socks independently)     Standing balance support: Bilateral upper extremity supported;During functional activity Standing balance-Leahy Scale: Poor                              Cognition Arousal/Alertness: Awake/alert Behavior During Therapy: WFL for tasks assessed/performed Overall Cognitive Status: Within Functional Limits for tasks assessed                                        Exercises      General Comments General comments (skin integrity, edema, etc.): VSS with ambulation, pt limited 2/2 fatigue.      Pertinent Vitals/Pain Pain Assessment: 0-10 Pain Score: 10-Worst pain ever Pain Location: R LE Pain Descriptors / Indicators: Aching Pain Intervention(s): Monitored during session;Limited activity within patient's tolerance    Home Living                      Prior Function            PT Goals (current goals can now be found in the care plan section) Acute Rehab PT Goals Patient Stated Goal: To go home Progress towards PT goals: Progressing toward goals    Frequency    Min 3X/week      PT  Plan Current plan remains appropriate    Co-evaluation              AM-PAC PT "6 Clicks" Mobility   Outcome Measure  Help needed turning from your back to your side while in a flat bed without using bedrails?: None Help needed moving from lying on your back to sitting on the side of a flat bed without using bedrails?: A Little Help needed moving to and from a bed to a chair (including a wheelchair)?: A Little Help needed standing up from a chair using your arms (e.g., wheelchair or bedside chair)?: A Little Help needed to walk in hospital room?: A Little Help needed climbing 3-5 steps with a railing? : A Lot 6 Click Score: 18    End of  Session Equipment Utilized During Treatment: Gait belt Activity Tolerance: Patient limited by fatigue Patient left: in bed;with call bell/phone within reach;with bed alarm set Nurse Communication: Mobility status PT Visit Diagnosis: Unsteadiness on feet (R26.81);Other abnormalities of gait and mobility (R26.89);Pain;Muscle weakness (generalized) (M62.81) Pain - Right/Left: Right Pain - part of body: Leg     Time: 5102-5852 PT Time Calculation (min) (ACUTE ONLY): 30 min  Charges:  $Gait Training: 8-22 mins $Therapeutic Activity: 8-22 mins                     Acute Rehab  Pager: [phone]    Garwin Brothers, SPT  07/02/2020, 2:29 PM

## 2020-07-02 NOTE — Progress Notes (Signed)
Pt resting comfortably SPo2 97 Hr 73 RR 18 No bipap needed at this time.

## 2020-07-02 NOTE — Progress Notes (Signed)
PROGRESS NOTE                                                                                                                                                                                                             Patient Demographics:    Anna [name], is a 70 y.o. female, DOB - [birth year], OJJ:[id number]  Outpatient Primary MD for the patient is Leanna Battles, MD    LOS - 4  Admit date - 06/28/2020    Chief Complaint  Patient presents with  . Shortness of Breath  . Weakness  . Tachycardia  . Fever       Brief Narrative (HPI from H&P) - Anna [name] is a 70 y.o. female with medical history significant for recurrent urinary tract infections, restless leg syndrome, hypertension, type 2 diabetes mellitus, stage III chronic kidney disease with baseline creatinine 1.1-1.4, presented to [facility] with high fever was diagnosed with severe sepsis due to UTI and admitted to the hospital.   Subjective:   Patient in bed, appears comfortable, denies any headache, no fever, no chest pain or pressure, no shortness of breath , no abdominal pain. No new focal weakness.    Assessment  & Plan :     1.  Severe sepsis due to current UTI.  High procalcitonin suggestive of possible bacteremia, has been placed on cefepime which will be continued, monitor cultures and procalcitonin, hydrate with IV appears nontoxic sepsis pathophysiology has likely resolved, urine and blood cultures negative so far, renal ultrasound nonacute, procalcitonin is still elevated however appears nontoxic and clinically improving on IV antibiotics which will be continued for total 5 days. Will need Urology follow up post DC due to multiple UTIs.  2.  Acute on chronic nonspecific CHF exacerbation night of 06/29/2020, echo shows acute on chronic systolic CHF with EF 93% along with some wall motion abnormality, cardiology on board, continue heparin drip  along with aspirin, statin and beta-blocker.  Volume status has improved, as needed Lasix IV.    3.  Non-ACS pattern elevation of troponin with sinus tachycardia, CAD on Cath.  Likely caused by sepsis creating demand mismatch, placed on aspirin, beta-blocker and statin, chest pain-free, echocardiogram as above,she underwent L.Cath 07/01/20 showing 2 vessel disease, plan for staged Cath early next week, Med Rx for now. Heart issues managed by Cards.  4.  Hypertension.  Placed on beta-blocker.  5.  Hypomagnesemia and hypokalemia.  Replaced.  6.  Dyslipidemia.  On statin.  7.  CKD 3A.  Baseline creatinine close to 1.4, had mild AKI resolved with hydration.  Monitor.  Hold nephrotoxins.  8.  Ongoing smoking.  Counseled to quit.       Condition -  Guarded  Family Communication  : Husband Dorothyann Peng [phone] on 06/30/2018 message left @ 9.54 am, updated bedside 06/30/2020  Code Status :  Full  Consults  :  Cards  PUD Prophylaxis : PPI   Procedures  :      Left heart cath due on 07/01/2020 -   SUMMARY  Severe two-vessel disease (codominant system); ? extensive LAD disease: Ostial (60%), proximal (segmental 70%)  and focal mid 70% just after major 1st Diag branch ? Focal eccentric 80% 2nd Mrg   Mild Secondary Pulmonary Hypertension with mean PAP of 26 mmHg, PCWP and LVEDP of 26 and 27 mmHg.  RVEDP and RAP 83mmHg  Moderate to severely reduced cardiac Output-Index: (Fick) 4.45-2.38; Thermodilution's 2.96-1.58.   Findings consistent with ACUTE COMBINED SYSTOLIC AND DIASTOLIC HEART FAILURE with likely ischemic contribution, but EF out of portion due to existing CAD.   RECOMMENDATIONS:  The patient be transferred to cardiac unit for post-cath care.  As per discussion with Dr. Gasper Sells -> we will diurese starting with 40 mg IV Lasix in the Cath Lab followed by 40 mg IV twice daily starting tomorrow.  Initiate GDMT for ICM and load with Brilinta over the weekend  Once  stable from a volume and renal function standpoint, would anticipate staged PCI of the LAD and OM 2 early next week.    CTA - No PE  TTE -   1. Left ventricular ejection fraction, by estimation, is 35 to 40%. Left ventricular ejection fraction by 3D volume is 37 %. The left ventricle has moderately decreased function. The left ventricle demonstrates regional wall motion abnormalities (see  scoring diagram/findings for description) in the mid and apical distribution. This can be seen in stress mediated cardiomyopathy. No LV thrombus visualized. There is mild concentric left ventricular hypertrophy. Indeterminate diastolic filling due to E-A fusion.  2. Right ventricular systolic function is normal. The right ventricular size is normal.  3. A small pericardial effusion is present.  4. The mitral valve is grossly normal. Trivial mitral valve regurgitation.  5. The aortic valve is tricuspid. There is mild calcification of the aortic valve. Aortic valve regurgitation is not visualized. Mild aortic valve sclerosis is present, with no evidence of aortic valve stenosis.  6. The inferior vena cava is normal in size with greater than 50% respiratory variability, suggesting right atrial pressure of 3 mmHg.   Renal US - Non acute      Disposition Plan  :    Status is: Inpatient  Remains inpatient appropriate because:IV treatments appropriate due to intensity of illness or inability to take PO   Dispo: The patient is from: Home              Anticipated d/c is to: Home              Patient currently is not medically stable to d/c.   Difficult to place patient No   DVT Prophylaxis  :    Heparin   Lab Results  Component Value Date   PLT 355 07/02/2020    Diet :  Diet Order  Diet Heart Room service appropriate? Yes; Fluid consistency: Thin  Diet effective now                  Inpatient Medications  Scheduled Meds: . aspirin EC  81 mg Oral Daily  . atorvastatin   40 mg Oral Daily  . fenofibrate  160 mg Oral Daily  . furosemide  40 mg Intravenous BID  . heparin  5,000 Units Subcutaneous Q8H  . insulin aspart  0-9 Units Subcutaneous TID WC  . metoprolol tartrate  25 mg Oral BID  . pregabalin  50 mg Oral BID  . sodium chloride flush  3 mL Intravenous Q12H  . ticagrelor  180 mg Oral Once  . ticagrelor  90 mg Oral BID   Continuous Infusions: . sodium chloride    . ceFEPime (MAXIPIME) IV 2 g (07/02/20 0823)   PRN Meds:.sodium chloride, acetaminophen **OR** [DISCONTINUED] acetaminophen, melatonin, metoprolol tartrate, sodium chloride flush  Antibiotics  :    Anti-infectives (From admission, onward)   Start     Dose/Rate Route Frequency Ordered Stop   06/30/20 1930  vancomycin (VANCOREADY) IVPB 1500 mg/300 mL  Status:  Discontinued        1,500 mg 150 mL/hr over 120 Minutes Intravenous Every 48 hours 06/28/20 2004 06/28/20 2125   06/29/20 0700  ceFEPIme (MAXIPIME) 2 g in sodium chloride 0.9 % 100 mL IVPB        2 g 200 mL/hr over 30 Minutes Intravenous Every 12 hours 06/28/20 2004     06/28/20 1830  ceFEPIme (MAXIPIME) 2 g in sodium chloride 0.9 % 100 mL IVPB        2 g 200 mL/hr over 30 Minutes Intravenous  Once 06/28/20 1815 06/28/20 1918   06/28/20 1830  metroNIDAZOLE (FLAGYL) IVPB 500 mg        500 mg 100 mL/hr over 60 Minutes Intravenous  Once 06/28/20 1815 06/28/20 1944   06/28/20 1830  vancomycin (VANCOREADY) IVPB 1500 mg/300 mL        1,500 mg 150 mL/hr over 120 Minutes Intravenous  Once 06/28/20 1815 06/28/20 2123       Time Spent in minutes  30   Lala Lund M.D on 07/02/2020 at 10:30 AM  To page go to [email or web address]   [facility] -  Office  [phone]     See all Orders from today for further details    Objective:   Vitals:   07/02/20 0700 07/02/20 0731 07/02/20 0800 07/02/20 0830  BP: 140/72 131/74 129/64 124/66  Pulse: 89 94 93 94  Resp:      Temp:  98 F (36.7 C)    TempSrc:  Oral    SpO2: 93% 97%  95% 97%  Weight:      Height:        Wt Readings from Last 3 Encounters:  07/01/20 77.5 kg  03/02/20 69.5 kg  01/11/20 72.1 kg     Intake/Output Summary (Last 24 hours) at 07/02/2020 1030 Last data filed at 07/02/2020 0601 Gross per 24 hour  Intake 753.88 ml  Output 450 ml  Net 303.88 ml     Physical Exam  Awake Alert, No new F.N deficits, Normal affect [place].AT,PERRAL Supple Neck,No JVD, No cervical lymphadenopathy appriciated.  Symmetrical Chest wall movement, Good air movement bilaterally, CTAB RRR,No Gallops, Rubs or new Murmurs, No Parasternal Heave +ve B.Sounds, Abd Soft, No tenderness, No organomegaly appriciated, No rebound - guarding or rigidity. No Cyanosis, Clubbing or edema, No new Rash  or bruise     Data Review:    CBC Recent Labs  Lab 06/28/20 1620 06/29/20 0246 06/29/20 2305 07/01/20 0021 07/01/20 1516 07/01/20 1521 07/01/20 1524 07/01/20 1528 07/01/20 1839 07/02/20 0340  WBC 15.1* 15.8* 15.5* 9.5  --   --   --   --  11.2* 8.5  HGB 10.5* 10.3* 9.6* 9.8*   < > 8.2* 8.5* 8.5* 10.6* 10.1*  HCT 32.5* 32.6* 29.2* 30.6*   < > 24.0* 25.0* 25.0* 31.9* 31.0*  PLT 274 246 305 324  --   --   --   --  244 355  MCV 101.2* 100.6* 99.0 101.0*  --   --   --   --  96.4 100.0  MCH 32.7 31.8 32.5 32.3  --   --   --   --  32.0 32.6  MCHC 32.3 31.6 32.9 32.0  --   --   --   --  33.2 32.6  RDW 18.2* 18.5* 18.4* 18.5*  --   --   --   --  18.5* 18.6*  LYMPHSABS 0.7 1.3 1.2 2.0  --   --   --   --   --  1.5  MONOABS 0.6 1.1* 1.1* 1.0  --   --   --   --   --  1.1*  EOSABS 0.0 0.0 0.0 0.1  --   --   --   --   --  0.1  BASOSABS 0.1 0.1 0.1 0.1  --   --   --   --   --  0.1   < > = values in this interval not displayed.    Recent Labs  Lab 06/28/20 1620 06/28/20 2001 06/28/20 2315 06/29/20 0246 06/29/20 2305 07/01/20 0021 07/01/20 1516 07/01/20 1521 07/01/20 1524 07/01/20 1528 07/01/20 1839 07/02/20 0340  NA 134*  --   --  136 137 137 137 140 140 138  --  133*   K 4.1  --   --  3.8 3.3* 3.8 3.9 3.6 3.6 3.8  --  3.5  CL 105  --   --  105 105 103  --   --   --   --   --  102  CO2 19*  --   --  20* 21* 26  --   --   --   --   --  22  GLUCOSE 310*  --   --  220* 212* 235*  --   --   --   --   --  177*  BUN 24*  --   --  22 23 28*  --   --   --   --   --  22  CREATININE 1.20*  --   --  1.23* 1.19* 1.38*  --   --   --   --  1.10* 1.17*  CALCIUM 8.6*  --   --  8.7* 8.9 8.8*  --   --   --   --   --  9.2  AST 18  --   --  17 17 24   --   --   --   --   --  17  ALT 13  --   --  15 16 19   --   --   --   --   --  17  ALKPHOS 78  --   --  71 71 83  --   --   --   --   --  71  BILITOT 1.1  --   --  0.8 0.8 0.5  --   --   --   --   --  0.6  ALBUMIN 2.8*  --   --  2.7* 2.7* 2.7*  --   --   --   --   --  2.7*  MG  --   --  1.4* 1.5* 2.0 2.0  --   --   --   --   --  1.9  PROCALCITON  --   --  6.95  --  9.73 8.12  --   --   --   --   --  4.48  LATICACIDVEN 3.3* 2.5*  --  1.6  --   --   --   --   --   --   --   --   INR 1.1  --   --   --   --   --   --   --   --   --   --   --   TSH  --   --   --  1.099  --   --   --   --   --   --   --   --   HGBA1C  --   --   --  7.5*  --   --   --   --   --   --   --   --   BNP  --   --   --   --  2,725.3* 1,430.1*  --   --   --   --   --  1,639.1*    ------------------------------------------------------------------------------------------------------------------ Recent Labs    07/01/20 0748  CHOL 135  HDL 42  LDLCALC 59  TRIG 168*  CHOLHDL 3.2    Lab Results  Component Value Date   HGBA1C 7.5 (H) 06/29/2020   ------------------------------------------------------------------------------------------------------------------ No results for input(s): TSH, T4TOTAL, T3FREE, THYROIDAB in the last 72 hours.  Invalid input(s): FREET3  Cardiac Enzymes No results for input(s): CKMB, TROPONINI, MYOGLOBIN in the last 168 hours.  Invalid input(s):  CK ------------------------------------------------------------------------------------------------------------------    Component Value Date/Time   BNP 1,639.1 (H) 07/02/2020 0340    Micro Results Recent Results (from the past 240 hour(s))  Resp Panel by RT-PCR (Flu A&B, Covid) Nasopharyngeal Swab     Status: None   Collection Time: 06/28/20  4:20 PM   Specimen: Nasopharyngeal Swab; Nasopharyngeal(NP) swabs in vial transport medium  Result Value Ref Range Status   SARS Coronavirus 2 by RT PCR NEGATIVE NEGATIVE Final    Comment: (NOTE) SARS-CoV-2 target nucleic acids are NOT DETECTED.  The SARS-CoV-2 RNA is generally detectable in upper respiratory specimens during the acute phase of infection. The lowest concentration of SARS-CoV-2 viral copies this assay can detect is 138 copies/mL. A negative result does not preclude SARS-Cov-2 infection and should not be used as the sole basis for treatment or other patient management decisions. A negative result may occur with  improper specimen collection/handling, submission of specimen other than nasopharyngeal swab, presence of viral mutation(s) within the areas targeted by this assay, and inadequate number of viral copies(<138 copies/mL). A negative result must be combined with clinical observations, patient history, and epidemiological information. The expected result is Negative.  Fact Sheet for Patients:  EntrepreneurPulse.com.au  Fact Sheet for Healthcare Providers:  IncredibleEmployment.be  This test is no t yet approved or cleared by the [place] FDA and  has  been authorized for detection and/or diagnosis of SARS-CoV-2 by FDA under an Emergency Use Authorization (EUA). This EUA will remain  in effect (meaning this test can be used) for the duration of the COVID-19 declaration under Section 564(b)(1) of the Act, 21 U.S.C.section 360bbb-3(b)(1), unless the authorization is terminated  or  revoked sooner.       Influenza A by PCR NEGATIVE NEGATIVE Final   Influenza B by PCR NEGATIVE NEGATIVE Final    Comment: (NOTE) The Xpert Xpress SARS-CoV-2/FLU/RSV plus assay is intended as an aid in the diagnosis of influenza from Nasopharyngeal swab specimens and should not be used as a sole basis for treatment. Nasal washings and aspirates are unacceptable for Xpert Xpress SARS-CoV-2/FLU/RSV testing.  Fact Sheet for Patients: EntrepreneurPulse.com.au  Fact Sheet for Healthcare Providers: IncredibleEmployment.be  This test is not yet approved or cleared by the [place] FDA and has been authorized for detection and/or diagnosis of SARS-CoV-2 by FDA under an Emergency Use Authorization (EUA). This EUA will remain in effect (meaning this test can be used) for the duration of the COVID-19 declaration under Section 564(b)(1) of the Act, 21 U.S.C. section 360bbb-3(b)(1), unless the authorization is terminated or revoked.  Performed at [facility], [place] [address] [place], [place] [address]   Blood culture (routine single)     Status: None (Preliminary result)   Collection Time: 06/28/20  4:23 PM   Specimen: BLOOD  Result Value Ref Range Status   Specimen Description BLOOD SITE NOT SPECIFIED  Final   Special Requests   Final    BOTTLES DRAWN AEROBIC AND ANAEROBIC Blood Culture results may not be optimal due to an inadequate volume of blood received in culture bottles   Culture   Final    NO GROWTH 4 DAYS Performed at [facility], [place] [address], [place], [place] [address]    Report Status PENDING  Incomplete  Urine culture     Status: None   Collection Time: 06/28/20  8:50 PM   Specimen: In/Out Cath Urine  Result Value Ref Range Status   Specimen Description IN/OUT CATH URINE  Final   Special Requests NONE  Final   Culture   Final    NO GROWTH Performed at [facility], [place] [address], [place], [place]  [address]    Report Status 06/30/2020 FINAL  Final  MRSA PCR Screening     Status: None   Collection Time: 06/29/20  6:58 AM   Specimen: Nasal Mucosa; Nasopharyngeal  Result Value Ref Range Status   MRSA by PCR NEGATIVE NEGATIVE Final    Comment:        The GeneXpert MRSA Assay (FDA approved for NASAL specimens only), is one component of a comprehensive MRSA colonization surveillance program. It is not intended to diagnose MRSA infection nor to guide or monitor treatment for MRSA infections. Performed at [facility], [place] [address], [place], [place] [address]     Radiology Reports  CT Angio Chest PE W/Cm &/Or Wo Cm  Result Date: 06/28/2020 CLINICAL DATA:  PE suspected, high prob Shortness of breath.  Weakness.  Tachycardia. EXAM: CT ANGIOGRAPHY CHEST WITH CONTRAST TECHNIQUE: Multidetector CT imaging of the chest was performed using the standard protocol during bolus administration of intravenous contrast. Multiplanar CT image reconstructions and MIPs were obtained to evaluate the vascular anatomy. CONTRAST:  12mL OMNIPAQUE IOHEXOL 350 MG/ML SOLN COMPARISON:  Radiograph earlier this day.  Chest CTA 01/14/2020 FINDINGS: Cardiovascular: There are no  filling defects within the pulmonary arteries to suggest pulmonary embolus. Aortic atherosclerosis. Conventional branching pattern from the aortic arch. No evidence of dissection or acute aortic findings. Normal heart size. Small amount of pericardial fluid anteriorly is similar to prior exam. Coronary artery calcifications. Mediastinum/Nodes: No enlarged mediastinal or hilar lymph nodes. No thyroid nodule. No esophageal wall thickening. Lungs/Pleura: Linear right upper lobe scarring is unchanged from prior exam. Previous pleural effusions have resolved. Previous septal thickening has resolved. No acute airspace disease. Trachea and central bronchi are patent. Mild central bronchial thickening. Upper Abdomen: No acute findings. Vascular  calcifications. Suspected hepatic steatosis. Musculoskeletal: There are no acute or suspicious osseous abnormalities. Review of the MIP images confirms the above findings. IMPRESSION: 1. No pulmonary embolus. 2. Mild central bronchial thickening, can be seen with bronchitis or reactive airways disease. 3. Right upper lobe scarring. 4. Aortic atherosclerosis.  Coronary artery calcifications. Aortic Atherosclerosis (ICD10-I70.0). Electronically Signed   By: Keith Rake M.D.   On: 06/28/2020 19:45     DG Chest Port 1 View  Result Date: 06/28/2020 CLINICAL DATA:  Fever and shortness of breath. EXAM: PORTABLE CHEST 1 VIEW COMPARISON:  June 28, 2020 (4:44 p.m.) FINDINGS: Decreased lung volumes are again seen with mild diffusely increased interstitial lung markings. This represents a new finding when compared to the prior study. Mild areas of atelectasis and/or infiltrate are seen within the bilateral infrahilar regions and medial aspect of the upper left lung. There is no evidence of a pleural effusion or pneumothorax. The heart size and mediastinal contours are within normal limits. The visualized skeletal structures are unremarkable. IMPRESSION: 1. Interval development of mild left upper lobe and bilateral infrahilar atelectasis and/or infiltrate since the prior study. 2. Additional findings consistent with mild interstitial edema. Electronically Signed   By: Virgina Norfolk M.D.   On: 06/28/2020 22:29   DG Chest Port 1 View  Result Date: 06/28/2020 CLINICAL DATA:  Questionable sepsis, fever, shortness of breath, weakness, tachycardia EXAM: PORTABLE CHEST 1 VIEW COMPARISON:  01/13/2020 FINDINGS: Cardiomegaly. Low volume AP portable examination without acute airspace opacity. The visualized skeletal structures are unremarkable. IMPRESSION: Cardiomegaly without acute abnormality of the lungs in AP portable projection. Electronically Signed   By: Eddie Candle M.D.   On: 06/28/2020 16:56

## 2020-07-02 NOTE — Care Management Important Message (Signed)
Important Message  Patient Details  Name: Anna [name] MRN: [id number] Date of Birth: 1950/12/08   Medicare Important Message Given:  Yes - Important Message mailed due to current National Emergency  Verbal consent obtained due to current National Emergency    Contact Name: Nelly Scriven Call Date: 07/02/20  Time: 1358 Phone: [phone] Outcome: Spoke with contact Important Message mailed to: Patient address on file    Delorse Lek 07/02/2020, 1:58 PM

## 2020-07-03 DIAGNOSIS — A419 Sepsis, unspecified organism: Secondary | ICD-10-CM | POA: Diagnosis not present

## 2020-07-03 DIAGNOSIS — R6520 Severe sepsis without septic shock: Secondary | ICD-10-CM | POA: Diagnosis not present

## 2020-07-03 LAB — COMPREHENSIVE METABOLIC PANEL
ALT: 16 U/L (ref 0–44)
AST: 16 U/L (ref 15–41)
Albumin: 2.7 g/dL — ABNORMAL LOW (ref 3.5–5.0)
Alkaline Phosphatase: 77 U/L (ref 38–126)
Anion gap: 10 (ref 5–15)
BUN: 23 mg/dL (ref 8–23)
CO2: 25 mmol/L (ref 22–32)
Calcium: 9.1 mg/dL (ref 8.9–10.3)
Chloride: 100 mmol/L (ref 98–111)
Creatinine, Ser: 1.34 mg/dL — ABNORMAL HIGH (ref 0.44–1.00)
GFR, Estimated: 42 mL/min — ABNORMAL LOW (ref >60)
Glucose, Bld: 182 mg/dL — ABNORMAL HIGH (ref 70–99)
Potassium: 3.1 mmol/L — ABNORMAL LOW (ref 3.5–5.1)
Sodium: 135 mmol/L (ref 135–145)
Total Bilirubin: 0.7 mg/dL (ref 0.3–1.2)
Total Protein: 6.7 g/dL (ref 6.5–8.1)

## 2020-07-03 LAB — GLUCOSE, CAPILLARY
Glucose-Capillary: 216 mg/dL — ABNORMAL HIGH (ref 70–99)
Glucose-Capillary: 337 mg/dL — ABNORMAL HIGH (ref 70–99)
Glucose-Capillary: 247 mg/dL — ABNORMAL HIGH (ref 70–99)
Glucose-Capillary: 226 mg/dL — ABNORMAL HIGH (ref 70–99)

## 2020-07-03 LAB — CBC WITH DIFFERENTIAL/PLATELET
Abs Immature Granulocytes: 0.09 10*3/uL — ABNORMAL HIGH (ref 0.00–0.07)
Basophils Absolute: 0.1 10*3/uL (ref 0.0–0.1)
Basophils Relative: 1 %
Eosinophils Absolute: 0.1 10*3/uL (ref 0.0–0.5)
Eosinophils Relative: 1 %
HCT: 30.5 % — ABNORMAL LOW (ref 36.0–46.0)
Hemoglobin: 10.2 g/dL — ABNORMAL LOW (ref 12.0–15.0)
Immature Granulocytes: 1 %
Lymphocytes Relative: 21 %
Lymphs Abs: 1.6 10*3/uL (ref 0.7–4.0)
MCH: 32.4 pg (ref 26.0–34.0)
MCHC: 33.4 g/dL (ref 30.0–36.0)
MCV: 96.8 fL (ref 80.0–100.0)
Monocytes Absolute: 1.1 10*3/uL — ABNORMAL HIGH (ref 0.1–1.0)
Monocytes Relative: 14 %
Neutro Abs: 4.7 10*3/uL (ref 1.7–7.7)
Neutrophils Relative %: 62 %
Platelets: 474 10*3/uL — ABNORMAL HIGH (ref 150–400)
RBC: 3.15 MIL/uL — ABNORMAL LOW (ref 3.87–5.11)
RDW: 18.2 % — ABNORMAL HIGH (ref 11.5–15.5)
WBC: 7.7 10*3/uL (ref 4.0–10.5)
nRBC: 0 % (ref 0.0–0.2)

## 2020-07-03 LAB — CULTURE, BLOOD (SINGLE): Culture: NO GROWTH

## 2020-07-03 LAB — PROCALCITONIN: Procalcitonin: 3.11 ng/mL

## 2020-07-03 LAB — BRAIN NATRIURETIC PEPTIDE: B Natriuretic Peptide: 1476.3 pg/mL — ABNORMAL HIGH (ref 0.0–100.0)

## 2020-07-03 LAB — MAGNESIUM: Magnesium: 1.9 mg/dL (ref 1.7–2.4)

## 2020-07-03 MED ORDER — ROPINIROLE HCL 0.5 MG PO TABS
0.5000 mg | ORAL_TABLET | Freq: Two times a day (BID) | ORAL | Status: DC
  Administered 2020-07-03 – 2020-07-06 (×7): 0.5 mg via ORAL
  Filled 2020-07-03 (×8): qty 1

## 2020-07-03 MED ORDER — METOPROLOL SUCCINATE ER 100 MG PO TB24
100.0000 mg | ORAL_TABLET | Freq: Every day | ORAL | Status: DC
  Administered 2020-07-04 – 2020-07-06 (×3): 100 mg via ORAL
  Filled 2020-07-03 (×3): qty 1

## 2020-07-03 MED ORDER — POTASSIUM CHLORIDE CRYS ER 20 MEQ PO TBCR
40.0000 meq | EXTENDED_RELEASE_TABLET | Freq: Two times a day (BID) | ORAL | Status: AC
  Administered 2020-07-03 (×2): 40 meq via ORAL
  Filled 2020-07-03 (×2): qty 2

## 2020-07-03 MED ORDER — FUROSEMIDE 10 MG/ML IJ SOLN
40.0000 mg | Freq: Two times a day (BID) | INTRAMUSCULAR | Status: DC
  Administered 2020-07-03: 40 mg via INTRAVENOUS
  Filled 2020-07-03: qty 4

## 2020-07-03 NOTE — Progress Notes (Signed)
PROGRESS NOTE                                                                                                                                                                                                             Patient Demographics:    Anna [name], is a 70 y.o. female, DOB - 03-10-1951, FOY:[id number]  Outpatient Primary MD for the patient is Leanna Battles, MD    LOS - 5  Admit date - 06/28/2020    Chief Complaint  Patient presents with  . Shortness of Breath  . Weakness  . Tachycardia  . Fever       Brief Narrative (HPI from H&P) - Anna [name] is a 70 y.o. female with medical history significant for recurrent urinary tract infections, restless leg syndrome, hypertension, type 2 diabetes mellitus, stage III chronic kidney disease with baseline creatinine 1.1-1.4, presented to [facility] with high fever was diagnosed with severe sepsis due to UTI and admitted to the hospital.   Subjective:   Patient in bed, appears comfortable, denies any headache, no fever, no chest pain or pressure, no shortness of breath , no abdominal pain. No new focal weakness.   Assessment  & Plan :     1.  Severe sepsis due to current UTI.  High procalcitonin suggestive of possible bacteremia, has been placed on cefepime which will be continued, monitor cultures and procalcitonin, hydrate with IV appears nontoxic sepsis pathophysiology has likely resolved, urine and blood cultures negative, renal ultrasound nonacute, procalcitonin is still elevated however appears nontoxic and has finished her ABX on 07/04/2018.  2.  Acute on chronic nonspecific CHF exacerbation night of 06/29/2020, echo shows acute on chronic systolic CHF with EF 76% along with some wall motion abnormality, cardiology on board, continue heparin drip along with aspirin, statin and beta-blocker, her volume status has improved, as needed Lasix IV per Cards.    3.   Non-ACS pattern elevation of troponin with sinus tachycardia, CAD on Cath.  Likely caused by sepsis creating demand mismatch, placed on aspirin, beta-blocker and statin, chest pain-free, echocardiogram as above,she underwent a diagnostic L.Cath 07/01/20 showing 2 vessel disease, plan for 2nd Cath early next week, Med Rx for now. Heart issues managed by Cards.  4.  Hypertension.  Placed on beta-blocker.  5.  Hypomagnesemia and hypokalemia.  Replaced.  6.  Dyslipidemia.  On statin.  7.  CKD 3A.  Baseline creatinine close to 1.4, had mild AKI resolved with hydration.     8.  Ongoing smoking.  Counseled to quit.       Condition -  Guarded  Family Communication  : Husband Dorothyann Peng [phone] on 06/30/2018 message left @ 9.54 am, updated bedside 06/30/2020  Code Status :  Full  Consults  :  Cards  PUD Prophylaxis : PPI   Procedures  :      Left heart cath due on 07/01/2020 -   SUMMARY  Severe two-vessel disease (codominant system); ? extensive LAD disease: Ostial (60%), proximal (segmental 70%)  and focal mid 70% just after major 1st Diag branch ? Focal eccentric 80% 2nd Mrg   Mild Secondary Pulmonary Hypertension with mean PAP of 26 mmHg, PCWP and LVEDP of 26 and 27 mmHg.  RVEDP and RAP 89mmHg  Moderate to severely reduced cardiac Output-Index: (Fick) 4.45-2.38; Thermodilution's 2.96-1.58.   Findings consistent with ACUTE COMBINED SYSTOLIC AND DIASTOLIC HEART FAILURE with likely ischemic contribution, but EF out of portion due to existing CAD.   RECOMMENDATIONS:  The patient be transferred to cardiac unit for post-cath care.  As per discussion with Dr. Gasper Sells -> we will diurese starting with 40 mg IV Lasix in the Cath Lab followed by 40 mg IV twice daily starting tomorrow.  Initiate GDMT for ICM and load with Brilinta over the weekend  Once stable from a volume and renal function standpoint, would anticipate staged PCI of the LAD and OM 2 early next  week.    CTA - No PE  TTE -   1. Left ventricular ejection fraction, by estimation, is 35 to 40%. Left ventricular ejection fraction by 3D volume is 37 %. The left ventricle has moderately decreased function. The left ventricle demonstrates regional wall motion abnormalities (see  scoring diagram/findings for description) in the mid and apical distribution. This can be seen in stress mediated cardiomyopathy. No LV thrombus visualized. There is mild concentric left ventricular hypertrophy. Indeterminate diastolic filling due to E-A fusion.  2. Right ventricular systolic function is normal. The right ventricular size is normal.  3. A small pericardial effusion is present.  4. The mitral valve is grossly normal. Trivial mitral valve regurgitation.  5. The aortic valve is tricuspid. There is mild calcification of the aortic valve. Aortic valve regurgitation is not visualized. Mild aortic valve sclerosis is present, with no evidence of aortic valve stenosis.  6. The inferior vena cava is normal in size with greater than 50% respiratory variability, suggesting right atrial pressure of 3 mmHg.   Renal US - Non acute      Disposition Plan  :    Status is: Inpatient  Remains inpatient appropriate because:IV treatments appropriate due to intensity of illness or inability to take PO   Dispo: The patient is from: Home              Anticipated d/c is to: Home              Patient currently is not medically stable to d/c.   Difficult to place patient No   DVT Prophylaxis  :    Heparin   Lab Results  Component Value Date   PLT 474 (H) 07/03/2020    Diet :  Diet Order            Diet Heart Room service appropriate? Yes; Fluid consistency: Thin  Diet effective now  Inpatient Medications  Scheduled Meds: . aspirin EC  81 mg Oral Daily  . atorvastatin  40 mg Oral Daily  . fenofibrate  160 mg Oral Daily  . furosemide  40 mg Intravenous BID  . heparin  5,000  Units Subcutaneous Q8H  . insulin aspart  0-9 Units Subcutaneous TID WC  . metoprolol tartrate  25 mg Oral BID  . potassium chloride  40 mEq Oral BID  . pregabalin  50 mg Oral BID  . rOPINIRole  0.5 mg Oral BID  . sodium chloride flush  3 mL Intravenous Q12H  . ticagrelor  90 mg Oral BID   Continuous Infusions: . sodium chloride     PRN Meds:.sodium chloride, acetaminophen **OR** [DISCONTINUED] acetaminophen, melatonin, metoprolol tartrate, sodium chloride flush     Time Spent in minutes  30   Lala Lund M.D on 07/03/2020 at 9:20 AM  To page go to [email or web address]   [facility] -  Office  [phone]     See all Orders from today for further details    Objective:   Vitals:   07/03/20 0009 07/03/20 0543 07/03/20 0645 07/03/20 0807  BP: 117/69 (!) 157/67  131/82  Pulse: 72 70  89  Resp: 16 16    Temp:  98.2 F (36.8 C)    TempSrc:  Oral    SpO2: 91% 98%    Weight:   75.5 kg   Height:        Wt Readings from Last 3 Encounters:  07/03/20 75.5 kg  03/02/20 69.5 kg  01/11/20 72.1 kg     Intake/Output Summary (Last 24 hours) at 07/03/2020 0920 Last data filed at 07/03/2020 0600 Gross per 24 hour  Intake 465 ml  Output 1250 ml  Net -785 ml     Physical Exam  Awake Alert, No new F.N deficits, Normal affect Elliott.AT,PERRAL Supple Neck,No JVD, No cervical lymphadenopathy appriciated.  Symmetrical Chest wall movement, Good air movement bilaterally, CTAB RRR,No Gallops, Rubs or new Murmurs, No Parasternal Heave +ve B.Sounds, Abd Soft, No tenderness, No organomegaly appriciated, No rebound - guarding or rigidity. No Cyanosis, Clubbing or edema, No new Rash or bruise     Data Review:    CBC Recent Labs  Lab 06/29/20 0246 06/29/20 2305 07/01/20 0021 07/01/20 1516 07/01/20 1524 07/01/20 1528 07/01/20 1839 07/02/20 0340 07/03/20 0144  WBC 15.8* 15.5* 9.5  --   --   --  11.2* 8.5 7.7  HGB 10.3* 9.6* 9.8*   < > 8.5* 8.5* 10.6* 10.1* 10.2*  HCT  32.6* 29.2* 30.6*   < > 25.0* 25.0* 31.9* 31.0* 30.5*  PLT 246 305 324  --   --   --  244 355 474*  MCV 100.6* 99.0 101.0*  --   --   --  96.4 100.0 96.8  MCH 31.8 32.5 32.3  --   --   --  32.0 32.6 32.4  MCHC 31.6 32.9 32.0  --   --   --  33.2 32.6 33.4  RDW 18.5* 18.4* 18.5*  --   --   --  18.5* 18.6* 18.2*  LYMPHSABS 1.3 1.2 2.0  --   --   --   --  1.5 1.6  MONOABS 1.1* 1.1* 1.0  --   --   --   --  1.1* 1.1*  EOSABS 0.0 0.0 0.1  --   --   --   --  0.1 0.1  BASOSABS 0.1 0.1 0.1  --   --   --   --  0.1 0.1   < > = values in this interval not displayed.    Recent Labs  Lab 06/28/20 1620 06/28/20 1620 06/28/20 2001 06/28/20 2315 06/29/20 0246 06/29/20 2305 07/01/20 0021 07/01/20 1516 07/01/20 1521 07/01/20 1524 07/01/20 1528 07/01/20 1839 07/02/20 0340 07/03/20 0144  NA 134*  --   --   --  136 137 137   < > 140 140 138  --  133* 135  K 4.1  --   --   --  3.8 3.3* 3.8   < > 3.6 3.6 3.8  --  3.5 3.1*  CL 105  --   --   --  105 105 103  --   --   --   --   --  102 100  CO2 19*  --   --   --  20* 21* 26  --   --   --   --   --  22 25  GLUCOSE 310*  --   --   --  220* 212* 235*  --   --   --   --   --  177* 182*  BUN 24*  --   --   --  22 23 28*  --   --   --   --   --  22 23  CREATININE 1.20*  --   --   --  1.23* 1.19* 1.38*  --   --   --   --  1.10* 1.17* 1.34*  CALCIUM 8.6*  --   --   --  8.7* 8.9 8.8*  --   --   --   --   --  9.2 9.1  AST 18  --   --   --  17 17 24   --   --   --   --   --  17 16  ALT 13  --   --   --  15 16 19   --   --   --   --   --  17 16  ALKPHOS 78  --   --   --  71 71 83  --   --   --   --   --  71 77  BILITOT 1.1  --   --   --  0.8 0.8 0.5  --   --   --   --   --  0.6 0.7  ALBUMIN 2.8*  --   --   --  2.7* 2.7* 2.7*  --   --   --   --   --  2.7* 2.7*  MG  --    < >  --  1.4* 1.5* 2.0 2.0  --   --   --   --   --  1.9 1.9  PROCALCITON  --   --   --  6.95  --  9.73 8.12  --   --   --   --   --  4.48 3.11  LATICACIDVEN 3.3*  --  2.5*  --  1.6  --   --   --    --   --   --   --   --   --   INR 1.1  --   --   --   --   --   --   --   --   --   --   --   --   --  TSH  --   --   --   --  1.099  --   --   --   --   --   --   --   --   --   HGBA1C  --   --   --   --  7.5*  --   --   --   --   --   --   --   --   --   BNP  --   --   --   --   --  2,548.4* 1,430.1*  --   --   --   --   --  1,639.1* 1,476.3*   < > = values in this interval not displayed.    ------------------------------------------------------------------------------------------------------------------ Recent Labs    07/01/20 0748  CHOL 135  HDL 42  LDLCALC 59  TRIG 168*  CHOLHDL 3.2    Lab Results  Component Value Date   HGBA1C 7.5 (H) 06/29/2020   ------------------------------------------------------------------------------------------------------------------ No results for input(s): TSH, T4TOTAL, T3FREE, THYROIDAB in the last 72 hours.  Invalid input(s): FREET3  Cardiac Enzymes No results for input(s): CKMB, TROPONINI, MYOGLOBIN in the last 168 hours.  Invalid input(s): CK ------------------------------------------------------------------------------------------------------------------    Component Value Date/Time   BNP 1,476.3 (H) 07/03/2020 0144    Micro Results Recent Results (from the past 240 hour(s))  Resp Panel by RT-PCR (Flu A&B, Covid) Nasopharyngeal Swab     Status: None   Collection Time: 06/28/20  4:20 PM   Specimen: Nasopharyngeal Swab; Nasopharyngeal(NP) swabs in vial transport medium  Result Value Ref Range Status   SARS Coronavirus 2 by RT PCR NEGATIVE NEGATIVE Final    Comment: (NOTE) SARS-CoV-2 target nucleic acids are NOT DETECTED.  The SARS-CoV-2 RNA is generally detectable in upper respiratory specimens during the acute phase of infection. The lowest concentration of SARS-CoV-2 viral copies this assay can detect is 138 copies/mL. A negative result does not preclude SARS-Cov-2 infection and should not be used as the sole basis for  treatment or other patient management decisions. A negative result may occur with  improper specimen collection/handling, submission of specimen other than nasopharyngeal swab, presence of viral mutation(s) within the areas targeted by this assay, and inadequate number of viral copies(<138 copies/mL). A negative result must be combined with clinical observations, patient history, and epidemiological information. The expected result is Negative.  Fact Sheet for Patients:  EntrepreneurPulse.com.au  Fact Sheet for Healthcare Providers:  IncredibleEmployment.be  This test is no t yet approved or cleared by the [place] FDA and  has been authorized for detection and/or diagnosis of SARS-CoV-2 by FDA under an Emergency Use Authorization (EUA). This EUA will remain  in effect (meaning this test can be used) for the duration of the COVID-19 declaration under Section 564(b)(1) of the Act, 21 U.S.C.section 360bbb-3(b)(1), unless the authorization is terminated  or revoked sooner.       Influenza A by PCR NEGATIVE NEGATIVE Final   Influenza B by PCR NEGATIVE NEGATIVE Final    Comment: (NOTE) The Xpert Xpress SARS-CoV-2/FLU/RSV plus assay is intended as an aid in the diagnosis of influenza from Nasopharyngeal swab specimens and should not be used as a sole basis for treatment. Nasal washings and aspirates are unacceptable for Xpert Xpress SARS-CoV-2/FLU/RSV testing.  Fact Sheet for Patients: EntrepreneurPulse.com.au  Fact Sheet for Healthcare Providers: IncredibleEmployment.be  This test is not yet approved or cleared by the [place] FDA and has been authorized for detection and/or  diagnosis of SARS-CoV-2 by FDA under an Emergency Use Authorization (EUA). This EUA will remain in effect (meaning this test can be used) for the duration of the COVID-19 declaration under Section 564(b)(1) of the Act, 21  U.S.C. section 360bbb-3(b)(1), unless the authorization is terminated or revoked.  Performed at [facility], [place] [address], [place], [place] [address]   Blood culture (routine single)     Status: None (Preliminary result)   Collection Time: 06/28/20  4:23 PM   Specimen: BLOOD  Result Value Ref Range Status   Specimen Description BLOOD SITE NOT SPECIFIED  Final   Special Requests   Final    BOTTLES DRAWN AEROBIC AND ANAEROBIC Blood Culture results may not be optimal due to an inadequate volume of blood received in culture bottles   Culture   Final    NO GROWTH 4 DAYS Performed at [facility], [place] [address] [place], [place] [address]    Report Status PENDING  Incomplete  Urine culture     Status: None   Collection Time: 06/28/20  8:50 PM   Specimen: In/Out Cath Urine  Result Value Ref Range Status   Specimen Description IN/OUT CATH URINE  Final   Special Requests NONE  Final   Culture   Final    NO GROWTH Performed at [facility], [place] [address], [place], [place] [address]    Report Status 06/30/2020 FINAL  Final  MRSA PCR Screening     Status: None   Collection Time: 06/29/20  6:58 AM   Specimen: Nasal Mucosa; Nasopharyngeal  Result Value Ref Range Status   MRSA by PCR NEGATIVE NEGATIVE Final    Comment:        The GeneXpert MRSA Assay (FDA approved for NASAL specimens only), is one component of a comprehensive MRSA colonization surveillance program. It is not intended to diagnose MRSA infection nor to guide or monitor treatment for MRSA infections. Performed at [facility], [place] [address], [place],  [address]     Radiology Reports  CT Angio Chest PE W/Cm &/Or Wo Cm  Result Date: 06/28/2020 CLINICAL DATA:  PE suspected, high prob Shortness of breath.  Weakness.  Tachycardia. EXAM: CT ANGIOGRAPHY CHEST WITH CONTRAST TECHNIQUE: Multidetector CT imaging of the chest was performed using the standard protocol during bolus  administration of intravenous contrast. Multiplanar CT image reconstructions and MIPs were obtained to evaluate the vascular anatomy. CONTRAST:  56mL OMNIPAQUE IOHEXOL 350 MG/ML SOLN COMPARISON:  Radiograph earlier this day.  Chest CTA 01/14/2020 FINDINGS: Cardiovascular: There are no filling defects within the pulmonary arteries to suggest pulmonary embolus. Aortic atherosclerosis. Conventional branching pattern from the aortic arch. No evidence of dissection or acute aortic findings. Normal heart size. Small amount of pericardial fluid anteriorly is similar to prior exam. Coronary artery calcifications. Mediastinum/Nodes: No enlarged mediastinal or hilar lymph nodes. No thyroid nodule. No esophageal wall thickening. Lungs/Pleura: Linear right upper lobe scarring is unchanged from prior exam. Previous pleural effusions have resolved. Previous septal thickening has resolved. No acute airspace disease. Trachea and central bronchi are patent. Mild central bronchial thickening. Upper Abdomen: No acute findings. Vascular calcifications. Suspected hepatic steatosis. Musculoskeletal: There are no acute or suspicious osseous abnormalities. Review of the MIP images confirms the above findings. IMPRESSION: 1. No pulmonary embolus. 2. Mild central bronchial thickening, can be seen with bronchitis or reactive airways disease. 3. Right upper lobe scarring. 4. Aortic atherosclerosis.  Coronary artery calcifications. Aortic Atherosclerosis (ICD10-I70.0). Electronically  Signed   By: Keith Rake M.D.   On: 06/28/2020 19:45     DG Chest Port 1 View  Result Date: 06/28/2020 CLINICAL DATA:  Fever and shortness of breath. EXAM: PORTABLE CHEST 1 VIEW COMPARISON:  June 28, 2020 (4:44 p.m.) FINDINGS: Decreased lung volumes are again seen with mild diffusely increased interstitial lung markings. This represents a new finding when compared to the prior study. Mild areas of atelectasis and/or infiltrate are seen within the  bilateral infrahilar regions and medial aspect of the upper left lung. There is no evidence of a pleural effusion or pneumothorax. The heart size and mediastinal contours are within normal limits. The visualized skeletal structures are unremarkable. IMPRESSION: 1. Interval development of mild left upper lobe and bilateral infrahilar atelectasis and/or infiltrate since the prior study. 2. Additional findings consistent with mild interstitial edema. Electronically Signed   By: Virgina Norfolk M.D.   On: 06/28/2020 22:29   DG Chest Port 1 View  Result Date: 06/28/2020 CLINICAL DATA:  Questionable sepsis, fever, shortness of breath, weakness, tachycardia EXAM: PORTABLE CHEST 1 VIEW COMPARISON:  01/13/2020 FINDINGS: Cardiomegaly. Low volume AP portable examination without acute airspace opacity. The visualized skeletal structures are unremarkable. IMPRESSION: Cardiomegaly without acute abnormality of the lungs in AP portable projection. Electronically Signed   By: Eddie Candle M.D.   On: 06/28/2020 16:56

## 2020-07-03 NOTE — Progress Notes (Signed)
Progress Note  Patient Name: Anna [name] Date of Encounter: 07/03/2020  Primary Cardiologist: Werner Lean, MD   Subjective   No events overnight.  Creatinine has increase 2 days after LHC.  Patient notes sorness from her R AC stick and discomfort with a left arm IV with no swelling.   Inpatient Medications    Scheduled Meds:  aspirin EC  81 mg Oral Daily   atorvastatin  40 mg Oral Daily   fenofibrate  160 mg Oral Daily   furosemide  40 mg Intravenous BID   heparin  5,000 Units Subcutaneous Q8H   insulin aspart  0-9 Units Subcutaneous TID WC   metoprolol tartrate  25 mg Oral BID   potassium chloride  40 mEq Oral BID   pregabalin  50 mg Oral BID   rOPINIRole  0.5 mg Oral BID   sodium chloride flush  3 mL Intravenous Q12H   ticagrelor  90 mg Oral BID   Continuous Infusions:  sodium chloride     PRN Meds: sodium chloride, acetaminophen **OR** [DISCONTINUED] acetaminophen, melatonin, metoprolol tartrate, sodium chloride flush   Vital Signs    Vitals:   07/03/20 0009 07/03/20 0543 07/03/20 0645 07/03/20 0807  BP: 117/69 (!) 157/67  131/82  Pulse: 72 70  89  Resp: 16 16    Temp:  98.2 F (36.8 C)    TempSrc:  Oral    SpO2: 91% 98%    Weight:   75.5 kg   Height:        Intake/Output Summary (Last 24 hours) at 07/03/2020 1057 Last data filed at 07/03/2020 0600 Gross per 24 hour  Intake 465 ml  Output 1250 ml  Net -785 ml   Filed Weights   07/01/20 0434 07/01/20 0818 07/03/20 0645  Weight: 81.6 kg 77.5 kg 75.5 kg    Telemetry    SR - Personally Reviewed  ECG    No new last 24 hours - Personally Reviewed  Physical Exam   GEN: No acute distress.   Neck: No JVD Cardiac: RRR, no murmurs, rubs, or gallops. No hematoma R radial site Respiratory: Clear to auscultation bilaterally. GI: Soft, nontender, non-distended  MS: No edema; No deformity. Neuro:  Nonfocal  Psych: Normal affect   Labs    Chemistry Recent Labs  Lab 07/01/20 0021  07/01/20 1516 07/01/20 1528 07/01/20 1839 07/02/20 0340 07/03/20 0144  NA 137   < > 138  --  133* 135  K 3.8   < > 3.8  --  3.5 3.1*  CL 103  --   --   --  102 100  CO2 26  --   --   --  22 25  GLUCOSE 235*  --   --   --  177* 182*  BUN 28*  --   --   --  22 23  CREATININE 1.38*  --   --  1.10* 1.17* 1.34*  CALCIUM 8.8*  --   --   --  9.2 9.1  PROT 6.5  --   --   --  6.6 6.7  ALBUMIN 2.7*  --   --   --  2.7* 2.7*  AST 24  --   --   --  17 16  ALT 19  --   --   --  17 16  ALKPHOS 83  --   --   --  71 77  BILITOT 0.5  --   --   --  0.6 0.7  GFRNONAA 41*  --   --  53* 50* 42*  ANIONGAP 8  --   --   --  9 10   < > = values in this interval not displayed.     Hematology Recent Labs  Lab 07/01/20 1839 07/02/20 0340 07/03/20 0144  WBC 11.2* 8.5 7.7  RBC 3.31* 3.10* 3.15*  HGB 10.6* 10.1* 10.2*  HCT 31.9* 31.0* 30.5*  MCV 96.4 100.0 96.8  MCH 32.0 32.6 32.4  MCHC 33.2 32.6 33.4  RDW 18.5* 18.6* 18.2*  PLT 244 355 474*    Cardiac EnzymesNo results for input(s): TROPONINI in the last 168 hours. No results for input(s): TROPIPOC in the last 168 hours.   BNP Recent Labs  Lab 07/01/20 0021 07/02/20 0340 07/03/20 0144  BNP 1,430.1* 1,639.1* 1,476.3*     DDimer No results for input(s): DDIMER in the last 168 hours.   Radiology    CARDIAC CATHETERIZATION  Result Date: 07/01/2020  Ost LAD to Prox LAD lesion is 60% stenosed. Prox LAD to Mid LAD lesion is 70% stenosed with 50% stenosed side branch in 1st Sept.  Mid LAD lesion is 70% stenosed.  Prox Cx to Mid Cx lesion is 40% stenosed with 55% stenosed side branch in 1st Mrg.  2nd Mrg lesion is 80% stenosed.  LV end diastolic pressure is severely elevated.  Hemodynamic findings consistent with mild pulmonary hypertension.  Hemodynamic findings are consistent with severe cardiomyopathy  Hemodynamics consistent ACUTE COMBINED SYSTOLIC AND DIASTOLIC HEART FAILURE  SUMMARY  Severe two-vessel disease (codominant system);   extensive LAD disease: Ostial (60%), proximal (segmental 70%)  and focal mid 70% just after major 1st Diag branch  Focal eccentric 80% 2nd Mrg  Mild Secondary Pulmonary Hypertension with mean PAP of 26 mmHg, PCWP and LVEDP of 26 and 27 mmHg.  RVEDP and RAP 59mmHg  Moderate to severely reduced cardiac Output-Index: (Fick) 4.45-2.38; Thermodilution's 2.96-1.58.  Findings consistent with ACUTE COMBINED SYSTOLIC AND DIASTOLIC HEART FAILURE with likely ischemic contribution, but EF out of portion due to existing CAD. RECOMMENDATIONS:  The patient be transferred to cardiac unit for post-cath care.  As per discussion with Dr. Gasper Sells -> we will diurese starting with 40 mg IV Lasix in the Cath Lab followed by 40 mg IV twice daily starting tomorrow.  Initiate GDMT for ICM and load with Brilinta over the weekend  Once stable from a volume and renal function standpoint, would anticipate staged PCI of the LAD and OM 2 early next week. Glenetta Hew, MD    Cardiac Studies   Transthoracic Echocardiogram: Date:06/29/20 Results: LAD vs Takotsubo  1. Left ventricular ejection fraction, by estimation, is 35 to 40%. Left  ventricular ejection fraction by 3D volume is 37 %. The left ventricle has  moderately decreased function. The left ventricle demonstrates regional  wall motion abnormalities (see  scoring diagram/findings for description) in the mid and apical  distribution. This can be seen in stress mediated cardiomyopathy. No LV  thrombus visualized. There is mild concentric left ventricular  hypertrophy. Indeterminate diastolic filling due to E-A   fusion.   2. Right ventricular systolic function is normal. The right ventricular  size is normal.   3. A small pericardial effusion is present.   4. The mitral valve is grossly normal. Trivial mitral valve  regurgitation.   5. The aortic valve is tricuspid. There is mild calcification of the  aortic valve. Aortic valve regurgitation is not  visualized. Mild aortic  valve sclerosis  is present, with no evidence of aortic valve stenosis.   6. The inferior vena cava is normal in size with greater than 50%  respiratory variability, suggesting right atrial pressure of 3 mmHg.   NonCardiac CT: Date: 06/28/20 Results: Aortic Atherosclerosis and LAD Calcium noted  Left/Right Heart Catheterizations: Date: 07/01/20 Results: Colon Flattery LAD to Prox LAD lesion is 60% stenosed. Prox LAD to Mid LAD lesion is 70% stenosed with 50% stenosed side branch in 1st Sept. Mid LAD lesion is 70% stenosed. Prox Cx to Mid Cx lesion is 40% stenosed with 55% stenosed side branch in 1st Mrg. 2nd Mrg lesion is 80% stenosed. LV end diastolic pressure is severely elevated. Hemodynamic findings consistent with mild pulmonary hypertension. Hemodynamic findings are consistent with severe cardiomyopathy Hemodynamics consistent ACUTE COMBINED SYSTOLIC AND DIASTOLIC HEART FAILURE   SUMMARY Severe two-vessel disease (codominant system); extensive LAD disease: Ostial (60%), proximal (segmental 70%)  and focal mid 70% just after major 1st Diag branch Focal eccentric 80% 2nd Mrg   Mild Secondary Pulmonary Hypertension with mean PAP of 26 mmHg, PCWP and LVEDP of 26 and 27 mmHg.  RVEDP and RAP 70mmHg Moderate to severely reduced cardiac Output-Index: (Fick) 4.45-2.38; Thermodilution's 2.96-1.58.   Findings consistent with ACUTE COMBINED SYSTOLIC AND DIASTOLIC HEART FAILURE with likely ischemic contribution, but EF out of portion due to existing CAD.     Patient Profile     70 y.o. female with PMH of frequent UTI, HTN, DM type 2, CKD stage III, hyperlipidemia, peripheral neuropathy, chronic back pain, arthritis, essential thrombocytosis,  restless leg syndrome, who is currently admitted for sepsis due to UTI. Cardiology is following for elevated troponin, dyspnea, and volume overload. Cath revealing severe 2 vessel CAD, plan for staged PCI.   Assessment & Plan     NSTEMI Sepsis with UTI HFrEF  HTN with DM Aortic Atherosclerosis CKD Stage IIIa Tobacco Abuse - presently planned for PCI 07/05/20 barring kidney issues - continue ASA and Briltina - continue atorvastatin 40 mg PO Daily - transitioning metoprolol to succinate for 06/18/20 (100 mg PO Daily) - BNP still elevated - home fenofibrate reasonable - given diuresis; CIN, repeat cath planned; have not yet started ARNI/ARB/MRA - given initial presentation UTI (fourth presentation), have not yet starte SGLT2i  Time Spent Directly with Patient:   I have spent a total of 35 minutes with the patient reviewing notes, imaging, EKGs, labs and examining the patient as well as establishing an assessment and plan that was discussed personally with the patient.  > 50% of time was spent in direct patient care and family (husband and daughter). - discussed SOB with Ticagrelor (this has resolved with caffeine) - discussed the delays that PCI may have on back surgery in the future - discussed difference between PT and cardiac rehab  For questions or updates, please contact [place] Please consult [email or web address] for contact info under Cardiology/STEMI.      Signed, Werner Lean, MD  07/03/2020, 10:57 AM

## 2020-07-04 DIAGNOSIS — R6520 Severe sepsis without septic shock: Secondary | ICD-10-CM | POA: Diagnosis not present

## 2020-07-04 DIAGNOSIS — A419 Sepsis, unspecified organism: Secondary | ICD-10-CM | POA: Diagnosis not present

## 2020-07-04 LAB — BASIC METABOLIC PANEL
Anion gap: 13 (ref 5–15)
BUN: 33 mg/dL — ABNORMAL HIGH (ref 8–23)
CO2: 24 mmol/L (ref 22–32)
Calcium: 9.6 mg/dL (ref 8.9–10.3)
Chloride: 98 mmol/L (ref 98–111)
Creatinine, Ser: 1.74 mg/dL — ABNORMAL HIGH (ref 0.44–1.00)
GFR, Estimated: 31 mL/min — ABNORMAL LOW (ref >60)
Glucose, Bld: 219 mg/dL — ABNORMAL HIGH (ref 70–99)
Potassium: 3.9 mmol/L (ref 3.5–5.1)
Sodium: 135 mmol/L (ref 135–145)

## 2020-07-04 LAB — CBC
HCT: 32.6 % — ABNORMAL LOW (ref 36.0–46.0)
Hemoglobin: 10.5 g/dL — ABNORMAL LOW (ref 12.0–15.0)
MCH: 31.4 pg (ref 26.0–34.0)
MCHC: 32.2 g/dL (ref 30.0–36.0)
MCV: 97.6 fL (ref 80.0–100.0)
Platelets: 549 10*3/uL — ABNORMAL HIGH (ref 150–400)
RBC: 3.34 MIL/uL — ABNORMAL LOW (ref 3.87–5.11)
RDW: 18.2 % — ABNORMAL HIGH (ref 11.5–15.5)
WBC: 11.5 10*3/uL — ABNORMAL HIGH (ref 4.0–10.5)
nRBC: 0.2 % (ref 0.0–0.2)

## 2020-07-04 LAB — GLUCOSE, CAPILLARY
Glucose-Capillary: 213 mg/dL — ABNORMAL HIGH (ref 70–99)
Glucose-Capillary: 227 mg/dL — ABNORMAL HIGH (ref 70–99)
Glucose-Capillary: 156 mg/dL — ABNORMAL HIGH (ref 70–99)
Glucose-Capillary: 189 mg/dL — ABNORMAL HIGH (ref 70–99)
Glucose-Capillary: 203 mg/dL — ABNORMAL HIGH (ref 70–99)

## 2020-07-04 MED ORDER — LACTATED RINGERS IV SOLN: INTRAVENOUS | Status: AC

## 2020-07-04 MED ORDER — SODIUM CHLORIDE 0.9 % IV SOLN: INTRAVENOUS | Status: DC

## 2020-07-04 MED ORDER — SODIUM CHLORIDE 0.9 % IV SOLN: 250.0000 mL | INTRAVENOUS | Status: DC | PRN

## 2020-07-04 MED ORDER — SODIUM CHLORIDE 0.9% FLUSH: 3.0000 mL | Freq: Two times a day (BID) | INTRAVENOUS | Status: DC

## 2020-07-04 MED ORDER — SODIUM CHLORIDE 0.9% FLUSH: 3.0000 mL | INTRAVENOUS | Status: DC | PRN

## 2020-07-04 MED ORDER — ASPIRIN 81 MG PO CHEW
81.0000 mg | CHEWABLE_TABLET | ORAL | Status: AC
  Administered 2020-07-05: 81 mg via ORAL
  Filled 2020-07-04: qty 1

## 2020-07-04 NOTE — Progress Notes (Signed)
Progress Note  Patient Name: Anna [name] Date of Encounter: 07/04/2020  Primary Cardiologist: Werner Lean, MD   Subjective   No events overnight.  Patient has finally has some sleep.  No Brilinta related SOB since returning caffeine.  Excited to see her family today  Inpatient Medications    Scheduled Meds: . aspirin EC  81 mg Oral Daily  . atorvastatin  40 mg Oral Daily  . fenofibrate  160 mg Oral Daily  . heparin  5,000 Units Subcutaneous Q8H  . insulin aspart  0-9 Units Subcutaneous TID WC  . metoprolol succinate  100 mg Oral Daily  . pregabalin  50 mg Oral BID  . rOPINIRole  0.5 mg Oral BID  . ticagrelor  90 mg Oral BID   Continuous Infusions: . sodium chloride    . lactated ringers 100 mL/hr at 07/04/20 0912   PRN Meds: sodium chloride, acetaminophen **OR** [DISCONTINUED] acetaminophen, melatonin, metoprolol tartrate, sodium chloride flush   Vital Signs    Vitals:   07/03/20 2024 07/03/20 2109 07/04/20 0539 07/04/20 0829  BP: 132/66 107/71 (!) 120/59 136/82  Pulse: (!) 103 100 94 100  Resp: 18  18   Temp: 99.1 F (37.3 C)  98.1 F (36.7 C)   TempSrc: Oral  Oral   SpO2: 99%  95%   Weight:   72.8 kg   Height:        Intake/Output Summary (Last 24 hours) at 07/04/2020 1207 Last data filed at 07/04/2020 0912 Gross per 24 hour  Intake 483 ml  Output 2951 ml  Net -2468 ml   Filed Weights   07/01/20 0818 07/03/20 0645 07/04/20 0539  Weight: 77.5 kg 75.5 kg 72.8 kg    Telemetry    SR - Personally Reviewed  ECG    No new last 24 hours - Personally Reviewed  Physical Exam   GEN: No acute distress.   Neck: No JVD Cardiac: RRR, no murmurs, rubs, or gallops.  Respiratory: Clear to auscultation bilaterally good air movement GI: Soft, nontender, non-distended  MS: No edema; No deformity. Neuro:  Nonfocal  Psych: Normal affect   Labs    Chemistry Recent Labs  Lab 07/01/20 0021 07/01/20 1516 07/02/20 0340 07/03/20 0144  07/04/20 0604  NA 137   < > 133* 135 135  K 3.8   < > 3.5 3.1* 3.9  CL 103  --  102 100 98  CO2 26  --  22 25 24   GLUCOSE 235*  --  177* 182* 219*  BUN 28*  --  22 23 33*  CREATININE 1.38*   < > 1.17* 1.34* 1.74*  CALCIUM 8.8*  --  9.2 9.1 9.6  PROT 6.5  --  6.6 6.7  --   ALBUMIN 2.7*  --  2.7* 2.7*  --   AST 24  --  17 16  --   ALT 19  --  17 16  --   ALKPHOS 83  --  71 77  --   BILITOT 0.5  --  0.6 0.7  --   GFRNONAA 41*   < > 50* 42* 31*  ANIONGAP 8  --  9 10 13    < > = values in this interval not displayed.     Hematology Recent Labs  Lab 07/02/20 0340 07/03/20 0144 07/04/20 0604  WBC 8.5 7.7 11.5*  RBC 3.10* 3.15* 3.34*  HGB 10.1* 10.2* 10.5*  HCT 31.0* 30.5* 32.6*  MCV 100.0 96.8 97.6  MCH 32.6 32.4 31.4  MCHC 32.6 33.4 32.2  RDW 18.6* 18.2* 18.2*  PLT 355 474* 549*    Cardiac EnzymesNo results for input(s): TROPONINI in the last 168 hours. No results for input(s): TROPIPOC in the last 168 hours.   BNP Recent Labs  Lab 07/01/20 0021 07/02/20 0340 07/03/20 0144  BNP 1,430.1* 1,639.1* 1,476.3*     DDimer No results for input(s): DDIMER in the last 168 hours.   Radiology    No results found.  Cardiac Studies   Transthoracic Echocardiogram: Date:06/29/20 Results: LAD vs Takotsubo  1. Left ventricular ejection fraction, by estimation, is 35 to 40%. Left  ventricular ejection fraction by 3D volume is 37 %. The left ventricle has  moderately decreased function. The left ventricle demonstrates regional  wall motion abnormalities (see  scoring diagram/findings for description) in the mid and apical  distribution. This can be seen in stress mediated cardiomyopathy. No LV  thrombus visualized. There is mild concentric left ventricular  hypertrophy. Indeterminate diastolic filling due to E-A   fusion.   2. Right ventricular systolic function is normal. The right ventricular  size is normal.   3. A small pericardial effusion is present.   4. The mitral  valve is grossly normal. Trivial mitral valve  regurgitation.   5. The aortic valve is tricuspid. There is mild calcification of the  aortic valve. Aortic valve regurgitation is not visualized. Mild aortic  valve sclerosis is present, with no evidence of aortic valve stenosis.   6. The inferior vena cava is normal in size with greater than 50%  respiratory variability, suggesting right atrial pressure of 3 mmHg.   NonCardiac CT: Date: 06/28/20 Results: Aortic Atherosclerosis and LAD Calcium noted  Left/Right Heart Catheterizations: Date: 07/01/20 Results:  Colon Flattery LAD to Prox LAD lesion is 60% stenosed. Prox LAD to Mid LAD lesion is 70% stenosed with 50% stenosed side branch in 1st Sept.  Mid LAD lesion is 70% stenosed.  Prox Cx to Mid Cx lesion is 40% stenosed with 55% stenosed side branch in 1st Mrg.  2nd Mrg lesion is 80% stenosed.  LV end diastolic pressure is severely elevated.  Hemodynamic findings consistent with mild pulmonary hypertension.  Hemodynamic findings are consistent with severe cardiomyopathy  Hemodynamics consistent ACUTE COMBINED SYSTOLIC AND DIASTOLIC HEART FAILURE   SUMMARY 1. Severe two-vessel disease (codominant system); ? extensive LAD disease: Ostial (60%), proximal (segmental 70%)  and focal mid 70% just after major 1st Diag branch ? Focal eccentric 80% 2nd Mrg    Mild Secondary Pulmonary Hypertension with mean PAP of 26 mmHg, PCWP and LVEDP of 26 and 27 mmHg.  RVEDP and RAP 57mmHg  Moderate to severely reduced cardiac Output-Index: (Fick) 4.45-2.38; Thermodilution's 2.96-1.58.    Findings consistent with ACUTE COMBINED SYSTOLIC AND DIASTOLIC HEART FAILURE with likely ischemic contribution, but EF out of portion due to existing CAD.     Patient Profile     70 y.o. female with PMH of frequent UTI, HTN, DM type 2, CKD stage III, hyperlipidemia, peripheral neuropathy, chronic back pain, arthritis, essential thrombocytosis,  restless leg syndrome,  who is currently admitted for sepsis due to UTI. Cardiology is following for elevated troponin, dyspnea, and volume overload. Cath revealing severe 2 vessel CAD, plan for staged PCI.   Assessment & Plan    NSTEMI Sepsis with UTI HFrEF  HTN with DM Aortic Atherosclerosis CKD Stage IIIa Tobacco Abuse - presently planned for PCI 07/05/20  - continue ASA and Briltina - continue atorvastatin  40 mg PO Daily -  metoprolol succinate 100 mg - home fenofibrate reasonable - holding off all diuretics for today and tomorrow; hopeful for PCI 07/05/20 - given initial presentation UTI (fourth presentation), have not yet started  SGLT2i; no renal mediated drugs presently in the setting of variable creatinine - NPO at midnight except meds; patient had been added on to 4/18 schedule on Friday    For questions or updates, please contact [place] Please consult [email or web address] for contact info under Cardiology/STEMI.      Signed, Werner Lean, MD  07/04/2020, 12:07 PM

## 2020-07-04 NOTE — Progress Notes (Signed)
PROGRESS NOTE                                                                                                                                                                                                             Patient Demographics:    Anna [name], is a 70 y.o. female, DOB - 1950/06/17, ELF:[id number]  Outpatient Primary MD for the patient is Leanna Battles, MD    LOS - 6  Admit date - 06/28/2020    Chief Complaint  Patient presents with  . Shortness of Breath  . Weakness  . Tachycardia  . Fever       Brief Narrative (HPI from H&P) - Anna [name] is a 70 y.o. female with medical history significant for recurrent urinary tract infections, restless leg syndrome, hypertension, type 2 diabetes mellitus, stage III chronic kidney disease with baseline creatinine 1.1-1.4, presented to [facility] with high fever was diagnosed with severe sepsis due to UTI and admitted to the hospital.   Subjective:   Patient in bed, appears comfortable, denies any headache, no fever, no chest pain or pressure, no shortness of breath , no abdominal pain. No new focal weakness.   Assessment  & Plan :     1.  Severe sepsis due to current UTI.  High procalcitonin suggestive of possible bacteremia, has been placed on cefepime which will be continued, monitor cultures and procalcitonin, hydrate with IV appears nontoxic sepsis pathophysiology has likely resolved, urine and blood cultures negative, renal ultrasound nonacute, procalcitonin is still elevated however appears nontoxic and has finished her ABX on 07/04/2018.  2.  Acute on chronic nonspecific CHF exacerbation night of 06/29/2020, echo shows acute on chronic systolic CHF with EF 58% along with some wall motion abnormality, cardiology on board, was on 3 days of heparin drip, now on aspirin, statin and beta-blocker, appears slightly dry with AKI hold further diuretics, gentle 500  cc LR on 07/04/2020.  3.  Non-ACS pattern elevation of troponin with sinus tachycardia, CAD on Cath.  Likely caused by sepsis creating demand mismatch, placed on aspirin, beta-blocker and statin, chest pain-free, echocardiogram as above,she underwent a diagnostic L.Cath 07/01/20 showing 2 vessel disease, plan for 2nd Cath early next week, Med Rx for now. Heart issues managed by Cards.  4.  Hypertension.  Placed on beta-blocker.  5.  Hypomagnesemia  and hypokalemia.  Replaced.  6.  Dyslipidemia.  On statin.  7. AKI on CKD 3A.  Baseline creatinine close to 1.4, had mild AKI, hold further Lasix, gentle hydration on 07/05/2018.     8.  Ongoing smoking.  Counseled to quit.       Condition -  Guarded  Family Communication  : Husband Dorothyann Peng [phone] on 06/30/2018 message left @ 9.54 am, updated bedside 06/30/2020  Code Status :  Full  Consults  :  Cards  PUD Prophylaxis : PPI   Procedures  :      Left heart cath due on 07/01/2020 -   SUMMARY  Severe two-vessel disease (codominant system); ? extensive LAD disease: Ostial (60%), proximal (segmental 70%)  and focal mid 70% just after major 1st Diag branch ? Focal eccentric 80% 2nd Mrg   Mild Secondary Pulmonary Hypertension with mean PAP of 26 mmHg, PCWP and LVEDP of 26 and 27 mmHg.  RVEDP and RAP 44mmHg  Moderate to severely reduced cardiac Output-Index: (Fick) 4.45-2.38; Thermodilution's 2.96-1.58.   Findings consistent with ACUTE COMBINED SYSTOLIC AND DIASTOLIC HEART FAILURE with likely ischemic contribution, but EF out of portion due to existing CAD.   RECOMMENDATIONS:  The patient be transferred to cardiac unit for post-cath care.  As per discussion with Dr. Gasper Sells -> we will diurese starting with 40 mg IV Lasix in the Cath Lab followed by 40 mg IV twice daily starting tomorrow.  Initiate GDMT for ICM and load with Brilinta over the weekend  Once stable from a volume and renal function standpoint, would  anticipate staged PCI of the LAD and OM 2 early next week.    CTA - No PE  TTE -   1. Left ventricular ejection fraction, by estimation, is 35 to 40%. Left ventricular ejection fraction by 3D volume is 37 %. The left ventricle has moderately decreased function. The left ventricle demonstrates regional wall motion abnormalities (see  scoring diagram/findings for description) in the mid and apical distribution. This can be seen in stress mediated cardiomyopathy. No LV thrombus visualized. There is mild concentric left ventricular hypertrophy. Indeterminate diastolic filling due to E-A fusion.  2. Right ventricular systolic function is normal. The right ventricular size is normal.  3. A small pericardial effusion is present.  4. The mitral valve is grossly normal. Trivial mitral valve regurgitation.  5. The aortic valve is tricuspid. There is mild calcification of the aortic valve. Aortic valve regurgitation is not visualized. Mild aortic valve sclerosis is present, with no evidence of aortic valve stenosis.  6. The inferior vena cava is normal in size with greater than 50% respiratory variability, suggesting right atrial pressure of 3 mmHg.   Renal US - Non acute      Disposition Plan  :    Status is: Inpatient  Remains inpatient appropriate because:IV treatments appropriate due to intensity of illness or inability to take PO   Dispo: The patient is from: Home              Anticipated d/c is to: Home              Patient currently is not medically stable to d/c.   Difficult to place patient No   DVT Prophylaxis  :    Heparin   Lab Results  Component Value Date   PLT 549 (H) 07/04/2020    Diet :  Diet Order            Diet heart healthy/carb modified  Room service appropriate? Yes; Fluid consistency: Thin  Diet effective now                  Inpatient Medications  Scheduled Meds: . aspirin EC  81 mg Oral Daily  . atorvastatin  40 mg Oral Daily  . fenofibrate   160 mg Oral Daily  . heparin  5,000 Units Subcutaneous Q8H  . insulin aspart  0-9 Units Subcutaneous TID WC  . metoprolol succinate  100 mg Oral Daily  . pregabalin  50 mg Oral BID  . rOPINIRole  0.5 mg Oral BID  . ticagrelor  90 mg Oral BID   Continuous Infusions: . sodium chloride    . lactated ringers     PRN Meds:.sodium chloride, acetaminophen **OR** [DISCONTINUED] acetaminophen, melatonin, metoprolol tartrate, sodium chloride flush     Time Spent in minutes  30   Lala Lund M.D on 07/04/2020 at 8:46 AM  To page go to [email or web address]   [facility] -  Office  [phone]     See all Orders from today for further details    Objective:   Vitals:   07/03/20 2024 07/03/20 2109 07/04/20 0539 07/04/20 0829  BP: 132/66 107/71 (!) 120/59 136/82  Pulse: (!) 103 100 94 100  Resp: 18  18   Temp: 99.1 F (37.3 C)  98.1 F (36.7 C)   TempSrc: Oral  Oral   SpO2: 99%  95%   Weight:   72.8 kg   Height:        Wt Readings from Last 3 Encounters:  07/04/20 72.8 kg  03/02/20 69.5 kg  01/11/20 72.1 kg     Intake/Output Summary (Last 24 hours) at 07/04/2020 0846 Last data filed at 07/03/2020 2110 Gross per 24 hour  Intake 243 ml  Output 2951 ml  Net -2708 ml     Physical Exam  Awake Alert, No new F.N deficits, Normal affect [place].AT,PERRAL Supple Neck,No JVD, No cervical lymphadenopathy appriciated.  Symmetrical Chest wall movement, Good air movement bilaterally, CTAB RRR,No Gallops, Rubs or new Murmurs, No Parasternal Heave +ve B.Sounds, Abd Soft, No tenderness, No organomegaly appriciated, No rebound - guarding or rigidity. No Cyanosis, Clubbing or edema, No new Rash or bruise    Data Review:    CBC Recent Labs  Lab 06/29/20 0246 06/29/20 2305 07/01/20 0021 07/01/20 1516 07/01/20 1528 07/01/20 1839 07/02/20 0340 07/03/20 0144 07/04/20 0604  WBC 15.8* 15.5* 9.5  --   --  11.2* 8.5 7.7 11.5*  HGB 10.3* 9.6* 9.8*   < > 8.5* 10.6* 10.1* 10.2*  10.5*  HCT 32.6* 29.2* 30.6*   < > 25.0* 31.9* 31.0* 30.5* 32.6*  PLT 246 305 324  --   --  244 355 474* 549*  MCV 100.6* 99.0 101.0*  --   --  96.4 100.0 96.8 97.6  MCH 31.8 32.5 32.3  --   --  32.0 32.6 32.4 31.4  MCHC 31.6 32.9 32.0  --   --  33.2 32.6 33.4 32.2  RDW 18.5* 18.4* 18.5*  --   --  18.5* 18.6* 18.2* 18.2*  LYMPHSABS 1.3 1.2 2.0  --   --   --  1.5 1.6  --   MONOABS 1.1* 1.1* 1.0  --   --   --  1.1* 1.1*  --   EOSABS 0.0 0.0 0.1  --   --   --  0.1 0.1  --   BASOSABS 0.1 0.1 0.1  --   --   --  0.1 0.1  --    < > = values in this interval not displayed.    Recent Labs  Lab 06/28/20 1620 06/28/20 1620 06/28/20 2001 06/28/20 2315 06/29/20 0246 06/29/20 2305 07/01/20 0021 07/01/20 1516 07/01/20 1524 07/01/20 1528 07/01/20 1839 07/02/20 0340 07/03/20 0144 07/04/20 0604  NA 134*  --   --   --  136 137 137   < > 140 138  --  133* 135 135  K 4.1  --   --   --  3.8 3.3* 3.8   < > 3.6 3.8  --  3.5 3.1* 3.9  CL 105  --   --   --  105 105 103  --   --   --   --  102 100 98  CO2 19*  --   --   --  20* 21* 26  --   --   --   --  22 25 24   GLUCOSE 310*  --   --   --  220* 212* 235*  --   --   --   --  177* 182* 219*  BUN 24*  --   --   --  22 23 28*  --   --   --   --  22 23 33*  CREATININE 1.20*  --   --   --  1.23* 1.19* 1.38*  --   --   --  1.10* 1.17* 1.34* 1.74*  CALCIUM 8.6*  --   --   --  8.7* 8.9 8.8*  --   --   --   --  9.2 9.1 9.6  AST 18  --   --   --  17 17 24   --   --   --   --  17 16  --   ALT 13  --   --   --  15 16 19   --   --   --   --  17 16  --   ALKPHOS 78  --   --   --  71 71 83  --   --   --   --  71 77  --   BILITOT 1.1  --   --   --  0.8 0.8 0.5  --   --   --   --  0.6 0.7  --   ALBUMIN 2.8*  --   --   --  2.7* 2.7* 2.7*  --   --   --   --  2.7* 2.7*  --   MG  --    < >  --  1.4* 1.5* 2.0 2.0  --   --   --   --  1.9 1.9  --   PROCALCITON  --   --   --  6.95  --  9.73 8.12  --   --   --   --  4.48 3.11  --   LATICACIDVEN 3.3*  --  2.5*  --  1.6  --    --   --   --   --   --   --   --   --   INR 1.1  --   --   --   --   --   --   --   --   --   --   --   --   --   TSH  --   --   --   --  1.099  --   --   --   --   --   --   --   --   --   HGBA1C  --   --   --   --  7.5*  --   --   --   --   --   --   --   --   --   BNP  --   --   --   --   --  2,548.4* 1,430.1*  --   --   --   --  1,639.1* 1,476.3*  --    < > = values in this interval not displayed.    ------------------------------------------------------------------------------------------------------------------ No results for input(s): CHOL, HDL, LDLCALC, TRIG, CHOLHDL, LDLDIRECT in the last 72 hours.  Lab Results  Component Value Date   HGBA1C 7.5 (H) 06/29/2020   ------------------------------------------------------------------------------------------------------------------ No results for input(s): TSH, T4TOTAL, T3FREE, THYROIDAB in the last 72 hours.  Invalid input(s): FREET3  Cardiac Enzymes No results for input(s): CKMB, TROPONINI, MYOGLOBIN in the last 168 hours.  Invalid input(s): CK ------------------------------------------------------------------------------------------------------------------    Component Value Date/Time   BNP 1,476.3 (H) 07/03/2020 0144    Micro Results Recent Results (from the past 240 hour(s))  Resp Panel by RT-PCR (Flu A&B, Covid) Nasopharyngeal Swab     Status: None   Collection Time: 06/28/20  4:20 PM   Specimen: Nasopharyngeal Swab; Nasopharyngeal(NP) swabs in vial transport medium  Result Value Ref Range Status   SARS Coronavirus 2 by RT PCR NEGATIVE NEGATIVE Final    Comment: (NOTE) SARS-CoV-2 target nucleic acids are NOT DETECTED.  The SARS-CoV-2 RNA is generally detectable in upper respiratory specimens during the acute phase of infection. The lowest concentration of SARS-CoV-2 viral copies this assay can detect is 138 copies/mL. A negative result does not preclude SARS-Cov-2 infection and should not be used as the sole basis  for treatment or other patient management decisions. A negative result may occur with  improper specimen collection/handling, submission of specimen other than nasopharyngeal swab, presence of viral mutation(s) within the areas targeted by this assay, and inadequate number of viral copies(<138 copies/mL). A negative result must be combined with clinical observations, patient history, and epidemiological information. The expected result is Negative.  Fact Sheet for Patients:  EntrepreneurPulse.com.au  Fact Sheet for Healthcare Providers:  IncredibleEmployment.be  This test is no t yet approved or cleared by the [place] FDA and  has been authorized for detection and/or diagnosis of SARS-CoV-2 by FDA under an Emergency Use Authorization (EUA). This EUA will remain  in effect (meaning this test can be used) for the duration of the COVID-19 declaration under Section 564(b)(1) of the Act, 21 U.S.C.section 360bbb-3(b)(1), unless the authorization is terminated  or revoked sooner.       Influenza A by PCR NEGATIVE NEGATIVE Final   Influenza B by PCR NEGATIVE NEGATIVE Final    Comment: (NOTE) The Xpert Xpress SARS-CoV-2/FLU/RSV plus assay is intended as an aid in the diagnosis of influenza from Nasopharyngeal swab specimens and should not be used as a sole basis for treatment. Nasal washings and aspirates are unacceptable for Xpert Xpress SARS-CoV-2/FLU/RSV testing.  Fact Sheet for Patients: EntrepreneurPulse.com.au  Fact Sheet for Healthcare Providers: IncredibleEmployment.be  This test is not yet approved or cleared by the [place] FDA and has been authorized for detection and/or diagnosis of SARS-CoV-2 by FDA under an Emergency Use Authorization (EUA). This EUA will remain in effect (meaning this test can be  used) for the duration of the COVID-19 declaration under Section 564(b)(1) of the Act, 21  U.S.C. section 360bbb-3(b)(1), unless the authorization is terminated or revoked.  Performed at [facility], [place] [address], [place], [place] [address]   Blood culture (routine single)     Status: None   Collection Time: 06/28/20  4:23 PM   Specimen: BLOOD  Result Value Ref Range Status   Specimen Description BLOOD SITE NOT SPECIFIED  Final   Special Requests   Final    BOTTLES DRAWN AEROBIC AND ANAEROBIC Blood Culture results may not be optimal due to an inadequate volume of blood received in culture bottles   Culture   Final    NO GROWTH 5 DAYS Performed at [facility], [place] [address], [place], [place] [address]    Report Status 07/03/2020 FINAL  Final  Urine culture     Status: None   Collection Time: 06/28/20  8:50 PM   Specimen: In/Out Cath Urine  Result Value Ref Range Status   Specimen Description IN/OUT CATH URINE  Final   Special Requests NONE  Final   Culture   Final    NO GROWTH Performed at [facility], [place] [address] [place], [place] [address]    Report Status 06/30/2020 FINAL  Final  MRSA PCR Screening     Status: None   Collection Time: 06/29/20  6:58 AM   Specimen: Nasal Mucosa; Nasopharyngeal  Result Value Ref Range Status   MRSA by PCR NEGATIVE NEGATIVE Final    Comment:        The GeneXpert MRSA Assay (FDA approved for NASAL specimens only), is one component of a comprehensive MRSA colonization surveillance program. It is not intended to diagnose MRSA infection nor to guide or monitor treatment for MRSA infections. Performed at [facility], [place] [address], [place],  [address]     Radiology Reports  CT Angio Chest PE W/Cm &/Or Wo Cm  Result Date: 06/28/2020 CLINICAL DATA:  PE suspected, high prob Shortness of breath.  Weakness.  Tachycardia. EXAM: CT ANGIOGRAPHY CHEST WITH CONTRAST TECHNIQUE: Multidetector CT imaging of the chest was performed using the standard protocol during bolus administration of  intravenous contrast. Multiplanar CT image reconstructions and MIPs were obtained to evaluate the vascular anatomy. CONTRAST:  63mL OMNIPAQUE IOHEXOL 350 MG/ML SOLN COMPARISON:  Radiograph earlier this day.  Chest CTA 01/14/2020 FINDINGS: Cardiovascular: There are no filling defects within the pulmonary arteries to suggest pulmonary embolus. Aortic atherosclerosis. Conventional branching pattern from the aortic arch. No evidence of dissection or acute aortic findings. Normal heart size. Small amount of pericardial fluid anteriorly is similar to prior exam. Coronary artery calcifications. Mediastinum/Nodes: No enlarged mediastinal or hilar lymph nodes. No thyroid nodule. No esophageal wall thickening. Lungs/Pleura: Linear right upper lobe scarring is unchanged from prior exam. Previous pleural effusions have resolved. Previous septal thickening has resolved. No acute airspace disease. Trachea and central bronchi are patent. Mild central bronchial thickening. Upper Abdomen: No acute findings. Vascular calcifications. Suspected hepatic steatosis. Musculoskeletal: There are no acute or suspicious osseous abnormalities. Review of the MIP images confirms the above findings. IMPRESSION: 1. No pulmonary embolus. 2. Mild central bronchial thickening, can be seen with bronchitis or reactive airways disease. 3. Right upper lobe scarring. 4. Aortic atherosclerosis.  Coronary artery calcifications. Aortic Atherosclerosis (ICD10-I70.0). Electronically Signed   By: Keith Rake M.D.   On: 06/28/2020 19:45     DG Chest [facility]  Result Date: 06/28/2020 CLINICAL DATA:  Fever and shortness of breath. EXAM: PORTABLE CHEST 1 VIEW COMPARISON:  June 28, 2020 (4:44 p.m.) FINDINGS: Decreased lung volumes are again seen with mild diffusely increased interstitial lung markings. This represents a new finding when compared to the prior study. Mild areas of atelectasis and/or infiltrate are seen within the bilateral infrahilar  regions and medial aspect of the upper left lung. There is no evidence of a pleural effusion or pneumothorax. The heart size and mediastinal contours are within normal limits. The visualized skeletal structures are unremarkable. IMPRESSION: 1. Interval development of mild left upper lobe and bilateral infrahilar atelectasis and/or infiltrate since the prior study. 2. Additional findings consistent with mild interstitial edema. Electronically Signed   By: Virgina Norfolk M.D.   On: 06/28/2020 22:29   DG Chest Port 1 View  Result Date: 06/28/2020 CLINICAL DATA:  Questionable sepsis, fever, shortness of breath, weakness, tachycardia EXAM: PORTABLE CHEST 1 VIEW COMPARISON:  01/13/2020 FINDINGS: Cardiomegaly. Low volume AP portable examination without acute airspace opacity. The visualized skeletal structures are unremarkable. IMPRESSION: Cardiomegaly without acute abnormality of the lungs in AP portable projection. Electronically Signed   By: Eddie Candle M.D.   On: 06/28/2020 16:56

## 2020-07-05 DIAGNOSIS — A419 Sepsis, unspecified organism: Secondary | ICD-10-CM | POA: Diagnosis not present

## 2020-07-05 DIAGNOSIS — R6520 Severe sepsis without septic shock: Secondary | ICD-10-CM | POA: Diagnosis not present

## 2020-07-05 LAB — CBC
HCT: 32.2 % — ABNORMAL LOW (ref 36.0–46.0)
Hemoglobin: 10.4 g/dL — ABNORMAL LOW (ref 12.0–15.0)
MCH: 31.5 pg (ref 26.0–34.0)
MCHC: 32.3 g/dL (ref 30.0–36.0)
MCV: 97.6 fL (ref 80.0–100.0)
Platelets: 573 10*3/uL — ABNORMAL HIGH (ref 150–400)
RBC: 3.3 MIL/uL — ABNORMAL LOW (ref 3.87–5.11)
RDW: 17.8 % — ABNORMAL HIGH (ref 11.5–15.5)
WBC: 10.2 10*3/uL (ref 4.0–10.5)
nRBC: 0 % (ref 0.0–0.2)

## 2020-07-05 LAB — COMPREHENSIVE METABOLIC PANEL
ALT: 13 U/L (ref 0–44)
AST: 12 U/L — ABNORMAL LOW (ref 15–41)
Albumin: 2.8 g/dL — ABNORMAL LOW (ref 3.5–5.0)
Alkaline Phosphatase: 76 U/L (ref 38–126)
Anion gap: 9 (ref 5–15)
BUN: 29 mg/dL — ABNORMAL HIGH (ref 8–23)
CO2: 25 mmol/L (ref 22–32)
Calcium: 9.6 mg/dL (ref 8.9–10.3)
Chloride: 100 mmol/L (ref 98–111)
Creatinine, Ser: 1.77 mg/dL — ABNORMAL HIGH (ref 0.44–1.00)
GFR, Estimated: 30 mL/min — ABNORMAL LOW (ref >60)
Glucose, Bld: 184 mg/dL — ABNORMAL HIGH (ref 70–99)
Potassium: 3.6 mmol/L (ref 3.5–5.1)
Sodium: 134 mmol/L — ABNORMAL LOW (ref 135–145)
Total Bilirubin: 0.3 mg/dL (ref 0.3–1.2)
Total Protein: 6.7 g/dL (ref 6.5–8.1)

## 2020-07-05 LAB — GLUCOSE, CAPILLARY
Glucose-Capillary: 203 mg/dL — ABNORMAL HIGH (ref 70–99)
Glucose-Capillary: 167 mg/dL — ABNORMAL HIGH (ref 70–99)
Glucose-Capillary: 231 mg/dL — ABNORMAL HIGH (ref 70–99)
Glucose-Capillary: 221 mg/dL — ABNORMAL HIGH (ref 70–99)

## 2020-07-05 LAB — BRAIN NATRIURETIC PEPTIDE: B Natriuretic Peptide: 255.6 pg/mL — ABNORMAL HIGH (ref 0.0–100.0)

## 2020-07-05 LAB — MAGNESIUM: Magnesium: 1.9 mg/dL (ref 1.7–2.4)

## 2020-07-05 MED ORDER — LACTATED RINGERS IV SOLN: INTRAVENOUS | Status: AC

## 2020-07-05 MED ORDER — LACTATED RINGERS IV SOLN: INTRAVENOUS | Status: DC

## 2020-07-05 NOTE — Progress Notes (Signed)
Progress Note  Patient Name: Anna [name] Date of Encounter: 07/05/2020  Primary Cardiologist:   Werner Lean, MD   Subjective   No chest pain.    Inpatient Medications    Scheduled Meds: . aspirin EC  81 mg Oral Daily  . atorvastatin  40 mg Oral Daily  . fenofibrate  160 mg Oral Daily  . heparin  5,000 Units Subcutaneous Q8H  . insulin aspart  0-9 Units Subcutaneous TID WC  . metoprolol succinate  100 mg Oral Daily  . pregabalin  50 mg Oral BID  . rOPINIRole  0.5 mg Oral BID  . ticagrelor  90 mg Oral BID   Continuous Infusions: . sodium chloride    . lactated ringers 75 mL/hr at 07/05/20 0946   PRN Meds: sodium chloride, acetaminophen **OR** [DISCONTINUED] acetaminophen, melatonin, metoprolol tartrate, sodium chloride flush   Vital Signs    Vitals:   07/04/20 0539 07/04/20 0829 07/04/20 2006 07/05/20 0931  BP: (!) 120/59 136/82 129/71 (!) 135/54  Pulse: 94 100 87   Resp: 18  16   Temp: 98.1 F (36.7 C)  98.5 F (36.9 C)   TempSrc: Oral  Oral   SpO2: 95%  94%   Weight: 72.8 kg     Height:        Intake/Output Summary (Last 24 hours) at 07/05/2020 0953 Last data filed at 07/05/2020 0708 Gross per 24 hour  Intake --  Output 600 ml  Net -600 ml   Filed Weights   07/01/20 0818 07/03/20 0645 07/04/20 0539  Weight: 77.5 kg 75.5 kg 72.8 kg    Telemetry    NSR - Personally Reviewed  ECG    NA - Personally Reviewed  Physical Exam   GEN: No acute distress.   Neck: No  JVD Cardiac: RRR, no murmurs, rubs, or gallops.  Respiratory: Clear  to auscultation bilaterally. GI: Soft, nontender, non-distended  MS: No  edema; No deformity. Neuro:  Nonfocal  Psych: Normal affect   Labs    Chemistry Recent Labs  Lab 07/02/20 0340 07/03/20 0144 07/04/20 0604 07/05/20 0618  NA 133* 135 135 134*  K 3.5 3.1* 3.9 3.6  CL 102 100 98 100  CO2 22 25 24 25   GLUCOSE 177* 182* 219* 184*  BUN 22 23 33* 29*  CREATININE 1.17* 1.34* 1.74* 1.77*   CALCIUM 9.2 9.1 9.6 9.6  PROT 6.6 6.7  --  6.7  ALBUMIN 2.7* 2.7*  --  2.8*  AST 17 16  --  12*  ALT 17 16  --  13  ALKPHOS 71 77  --  76  BILITOT 0.6 0.7  --  0.3  GFRNONAA 50* 42* 31* 30*  ANIONGAP 9 10 13 9      Hematology Recent Labs  Lab 07/03/20 0144 07/04/20 0604 07/05/20 0618  WBC 7.7 11.5* 10.2  RBC 3.15* 3.34* 3.30*  HGB 10.2* 10.5* 10.4*  HCT 30.5* 32.6* 32.2*  MCV 96.8 97.6 97.6  MCH 32.4 31.4 31.5  MCHC 33.4 32.2 32.3  RDW 18.2* 18.2* 17.8*  PLT 474* 549* 573*    Cardiac EnzymesNo results for input(s): TROPONINI in the last 168 hours. No results for input(s): TROPIPOC in the last 168 hours.   BNP Recent Labs  Lab 07/02/20 0340 07/03/20 0144 07/05/20 0618  BNP 1,639.1* 1,476.3* 255.6*     DDimer No results for input(s): DDIMER in the last 168 hours.   Radiology    No results found.  Cardiac Studies  Diagnostic cath Dominance: Co-dominant      Patient Profile     70 y.o. female with PMH of frequent UTI, HTN, DM type 2, CKD stage III, hyperlipidemia, peripheral neuropathy, chronic back pain, arthritis,essential thrombocytosis, restless leg syndrome,who is currently admitted for sepsis due to UTI. Cardiology isfollowing for elevated troponin, dyspnea, and volume overload.Cath revealing severe 2 vessel CAD, plan for staged PCI.   Assessment & Plan    NSTEMI:  PCI on hold secondary to increased creatinine.  We will reevaluate in the AM and tentatively plan procedure for tomorrow.   Anatomy as above.  Cath films reviewed for these rounds.   ACUTE COMBINED HF:  Newly reduced EF of 35 - 40%.  Intake and output seem to be incomplete over the last couple of days.  Lasix held prior to cath.  Hold again today.  Hold ARB/ARNi.    DYSLIPIDEMIA:  Continue statin.    DM:  Pre primary team.  Agree no SGLT2i given recurrent UTIs.    CKD IIIa:    Creat is elevated.  We will see again in the AM and tentatively plan PCI tomorrow.     For questions  or updates, please contact [place] Please consult [email or web address] for contact info under Cardiology/STEMI.   Signed, Minus Breeding, MD  07/05/2020, 9:53 AM

## 2020-07-05 NOTE — Progress Notes (Signed)
Heart Failure Nurse Navigator Progress Note  Due to continue hospitalization and planned PCI cath today. Changed HV TOC appt to: Wednesday, April 27 @ 2pm per pt and family request.   Confirmed transportation.   Pricilla Holm, RN, BSN Heart Failure Nurse Navigator [phone]

## 2020-07-05 NOTE — Plan of Care (Signed)
  Problem: Clinical Measurements: Goal: Respiratory complications will improve Outcome: Progressing Goal: Cardiovascular complication will be avoided Outcome: Progressing   

## 2020-07-05 NOTE — Progress Notes (Signed)
PT Cancellation Note  Patient Details Name: Anna [name] MRN: [id number] DOB: 02-27-1951   Cancelled Treatment:    Reason Eval/Treat Not Completed: Patient declined, no reason specified Pt declined mobility at this time, "I have been up all morning and I could just give out and go to sleep." Will follow.   Anna [name] 07/05/2020, 11:21 AM Marisa Severin, PT, DPT Acute Rehabilitation Services Pager [phone] Office [phone]

## 2020-07-05 NOTE — Progress Notes (Signed)
PROGRESS NOTE                                                                                                                                                                                                             Patient Demographics:    Anna [name], is a 70 y.o. female, DOB - 01/15/1951, TDV:[id number]  Outpatient Primary MD for the patient is Leanna Battles, MD    LOS - 7  Admit date - 06/28/2020    Chief Complaint  Patient presents with  . Shortness of Breath  . Weakness  . Tachycardia  . Fever       Brief Narrative (HPI from H&P) - Anna [name] is a 70 y.o. female with medical history significant for recurrent urinary tract infections, restless leg syndrome, hypertension, type 2 diabetes mellitus, stage III chronic kidney disease with baseline creatinine 1.1-1.4, presented to [facility] with high fever was diagnosed with severe sepsis due to UTI and admitted to the hospital.   Subjective:   Patient in bed, appears comfortable, denies any headache, no fever, no chest pain or pressure, no shortness of breath , no abdominal pain. No new focal weakness.    Assessment  & Plan :     1.  Severe sepsis due to current UTI.  High procalcitonin suggestive of possible bacteremia, has been placed on cefepime which will be continued, monitor cultures and procalcitonin, hydrate with IV appears nontoxic sepsis pathophysiology has likely resolved, urine and blood cultures negative, renal ultrasound nonacute, procalcitonin is still elevated however appears nontoxic and has finished her ABX on 07/04/2018.  2.  Acute on chronic nonspecific CHF exacerbation night of 06/29/2020, echo shows acute on chronic systolic CHF with EF 06% along with some wall motion abnormality, cardiology on board, was on 3 days of heparin drip, now on aspirin, statin and beta-blocker, appears slightly dry with AKI hold further diuretics, gentle  500 cc LR on 07/04/2020, repeat 07/05/20.  3.  Non-ACS pattern elevation of troponin with sinus tachycardia, CAD on Cath.  Likely caused by sepsis creating demand mismatch, placed on aspirin, beta-blocker and statin, chest pain-free, echocardiogram as above,she underwent a diagnostic L.Cath 07/01/20 showing 2 vessel disease, plan for 2nd Cath 07/05/20, Med Rx for now. Heart issues managed by Cards.  4.  Hypertension.  Placed on beta-blocker.  5.  Hypomagnesemia and hypokalemia.  Replaced abd stable..  6.  Dyslipidemia.  On statin.  7. AKI on CKD 3A.  Baseline creatinine close to 1.4, had mild AKI, hold further Lasix, gentle hydration on 07/05/2018 & 07/05/20.    8.  Ongoing smoking.  Counseled to quit.       Condition -  Guarded  Family Communication  : Husband Dorothyann Peng [phone] on 06/30/2018 message left @ 9.54 am, updated bedside 06/30/2020  Code Status :  Full  Consults  :  Cards  PUD Prophylaxis : PPI   Procedures  :      Left heart cath due on 07/01/2020 -   SUMMARY  Severe two-vessel disease (codominant system); ? extensive LAD disease: Ostial (60%), proximal (segmental 70%)  and focal mid 70% just after major 1st Diag branch ? Focal eccentric 80% 2nd Mrg   Mild Secondary Pulmonary Hypertension with mean PAP of 26 mmHg, PCWP and LVEDP of 26 and 27 mmHg.  RVEDP and RAP 9mmHg  Moderate to severely reduced cardiac Output-Index: (Fick) 4.45-2.38; Thermodilution's 2.96-1.58.   Findings consistent with ACUTE COMBINED SYSTOLIC AND DIASTOLIC HEART FAILURE with likely ischemic contribution, but EF out of portion due to existing CAD.   RECOMMENDATIONS:  The patient be transferred to cardiac unit for post-cath care.  As per discussion with Dr. Gasper Sells -> we will diurese starting with 40 mg IV Lasix in the Cath Lab followed by 40 mg IV twice daily starting tomorrow.  Initiate GDMT for ICM and load with Brilinta over the weekend  Once stable from a volume and renal  function standpoint, would anticipate staged PCI of the LAD and OM 2 early next week.    CTA - No PE  TTE -   1. Left ventricular ejection fraction, by estimation, is 35 to 40%. Left ventricular ejection fraction by 3D volume is 37 %. The left ventricle has moderately decreased function. The left ventricle demonstrates regional wall motion abnormalities (see  scoring diagram/findings for description) in the mid and apical distribution. This can be seen in stress mediated cardiomyopathy. No LV thrombus visualized. There is mild concentric left ventricular hypertrophy. Indeterminate diastolic filling due to E-A fusion.  2. Right ventricular systolic function is normal. The right ventricular size is normal.  3. A small pericardial effusion is present.  4. The mitral valve is grossly normal. Trivial mitral valve regurgitation.  5. The aortic valve is tricuspid. There is mild calcification of the aortic valve. Aortic valve regurgitation is not visualized. Mild aortic valve sclerosis is present, with no evidence of aortic valve stenosis.  6. The inferior vena cava is normal in size with greater than 50% respiratory variability, suggesting right atrial pressure of 3 mmHg.   Renal US - Non acute      Disposition Plan  :    Status is: Inpatient  Remains inpatient appropriate because:IV treatments appropriate due to intensity of illness or inability to take PO   Dispo: The patient is from: Home              Anticipated d/c is to: Home              Patient currently is not medically stable to d/c.   Difficult to place patient No   DVT Prophylaxis  :    Heparin   Lab Results  Component Value Date   PLT 573 (H) 07/05/2020    Diet :  Diet Order  Diet NPO time specified Except for: Sips with Meds  Diet effective midnight                  Inpatient Medications  Scheduled Meds: . aspirin EC  81 mg Oral Daily  . atorvastatin  40 mg Oral Daily  . fenofibrate  160  mg Oral Daily  . heparin  5,000 Units Subcutaneous Q8H  . insulin aspart  0-9 Units Subcutaneous TID WC  . metoprolol succinate  100 mg Oral Daily  . pregabalin  50 mg Oral BID  . rOPINIRole  0.5 mg Oral BID  . ticagrelor  90 mg Oral BID   Continuous Infusions: . sodium chloride     PRN Meds:.sodium chloride, acetaminophen **OR** [DISCONTINUED] acetaminophen, melatonin, metoprolol tartrate, sodium chloride flush     Time Spent in minutes  30   Lala Lund M.D on 07/05/2020 at 9:32 AM  To page go to [email or web address]   [facility] -  Office  [phone]     See all Orders from today for further details    Objective:   Vitals:   07/03/20 2109 07/04/20 0539 07/04/20 0829 07/04/20 2006  BP: 107/71 (!) 120/59 136/82 129/71  Pulse: 100 94 100 87  Resp:  18  16  Temp:  98.1 F (36.7 C)  98.5 F (36.9 C)  TempSrc:  Oral  Oral  SpO2:  95%  94%  Weight:  72.8 kg    Height:        Wt Readings from Last 3 Encounters:  07/04/20 72.8 kg  03/02/20 69.5 kg  01/11/20 72.1 kg     Intake/Output Summary (Last 24 hours) at 07/05/2020 0932 Last data filed at 07/05/2020 0708 Gross per 24 hour  Intake --  Output 600 ml  Net -600 ml     Physical Exam  Awake Alert, No new F.N deficits, Normal affect [place].AT,PERRAL Supple Neck,No JVD, No cervical lymphadenopathy appriciated.  Symmetrical Chest wall movement, Good air movement bilaterally, CTAB RRR,No Gallops, Rubs or new Murmurs, No Parasternal Heave +ve B.Sounds, Abd Soft, No tenderness, No organomegaly appriciated, No rebound - guarding or rigidity. No Cyanosis, Clubbing or edema, No new Rash or bruise     Data Review:    CBC Recent Labs  Lab 06/29/20 0246 06/29/20 2305 07/01/20 0021 07/01/20 1516 07/01/20 1839 07/02/20 0340 07/03/20 0144 07/04/20 0604 07/05/20 0618  WBC 15.8* 15.5* 9.5  --  11.2* 8.5 7.7 11.5* 10.2  HGB 10.3* 9.6* 9.8*   < > 10.6* 10.1* 10.2* 10.5* 10.4*  HCT 32.6* 29.2* 30.6*   < >  31.9* 31.0* 30.5* 32.6* 32.2*  PLT 246 305 324  --  244 355 474* 549* 573*  MCV 100.6* 99.0 101.0*  --  96.4 100.0 96.8 97.6 97.6  MCH 31.8 32.5 32.3  --  32.0 32.6 32.4 31.4 31.5  MCHC 31.6 32.9 32.0  --  33.2 32.6 33.4 32.2 32.3  RDW 18.5* 18.4* 18.5*  --  18.5* 18.6* 18.2* 18.2* 17.8*  LYMPHSABS 1.3 1.2 2.0  --   --  1.5 1.6  --   --   MONOABS 1.1* 1.1* 1.0  --   --  1.1* 1.1*  --   --   EOSABS 0.0 0.0 0.1  --   --  0.1 0.1  --   --   BASOSABS 0.1 0.1 0.1  --   --  0.1 0.1  --   --    < > = values in this interval  not displayed.    Recent Labs  Lab 06/28/20 1620 06/28/20 1620 06/28/20 2001 06/28/20 2315 06/29/20 0246 06/29/20 2305 07/01/20 0021 07/01/20 1516 07/01/20 1528 07/01/20 1839 07/02/20 0340 07/03/20 0144 07/04/20 0604 07/05/20 0618  NA 134*  --   --   --  136 137 137   < > 138  --  133* 135 135 134*  K 4.1  --   --   --  3.8 3.3* 3.8   < > 3.8  --  3.5 3.1* 3.9 3.6  CL 105  --   --   --  105 105 103  --   --   --  102 100 98 100  CO2 19*  --   --   --  20* 21* 26  --   --   --  22 25 24 25   GLUCOSE 310*  --   --   --  220* 212* 235*  --   --   --  177* 182* 219* 184*  BUN 24*  --   --   --  22 23 28*  --   --   --  22 23 33* 29*  CREATININE 1.20*  --   --   --  1.23* 1.19* 1.38*  --   --  1.10* 1.17* 1.34* 1.74* 1.77*  CALCIUM 8.6*  --   --   --  8.7* 8.9 8.8*  --   --   --  9.2 9.1 9.6 9.6  AST 18  --   --   --  17 17 24   --   --   --  17 16  --  12*  ALT 13  --   --   --  15 16 19   --   --   --  17 16  --  13  ALKPHOS 78  --   --   --  71 71 83  --   --   --  71 77  --  76  BILITOT 1.1  --   --   --  0.8 0.8 0.5  --   --   --  0.6 0.7  --  0.3  ALBUMIN 2.8*  --   --   --  2.7* 2.7* 2.7*  --   --   --  2.7* 2.7*  --  2.8*  MG  --    < >  --  1.4* 1.5* 2.0 2.0  --   --   --  1.9 1.9  --  1.9  PROCALCITON  --   --   --  6.95  --  9.73 8.12  --   --   --  4.48 3.11  --   --   LATICACIDVEN 3.3*  --  2.5*  --  1.6  --   --   --   --   --   --   --   --   --   INR  1.1  --   --   --   --   --   --   --   --   --   --   --   --   --   TSH  --   --   --   --  1.099  --   --   --   --   --   --   --   --   --   HGBA1C  --   --   --   --  7.5*  --   --   --   --   --   --   --   --   --   BNP  --   --   --   --   --  2,548.4* 1,430.1*  --   --   --  1,639.1* 1,476.3*  --  255.6*   < > = values in this interval not displayed.    ------------------------------------------------------------------------------------------------------------------ No results for input(s): CHOL, HDL, LDLCALC, TRIG, CHOLHDL, LDLDIRECT in the last 72 hours.  Lab Results  Component Value Date   HGBA1C 7.5 (H) 06/29/2020   ------------------------------------------------------------------------------------------------------------------ No results for input(s): TSH, T4TOTAL, T3FREE, THYROIDAB in the last 72 hours.  Invalid input(s): FREET3  Cardiac Enzymes No results for input(s): CKMB, TROPONINI, MYOGLOBIN in the last 168 hours.  Invalid input(s): CK ------------------------------------------------------------------------------------------------------------------    Component Value Date/Time   BNP 255.6 (H) 07/05/2020 7062    Micro Results Recent Results (from the past 240 hour(s))  Resp Panel by RT-PCR (Flu A&B, Covid) Nasopharyngeal Swab     Status: None   Collection Time: 06/28/20  4:20 PM   Specimen: Nasopharyngeal Swab; Nasopharyngeal(NP) swabs in vial transport medium  Result Value Ref Range Status   SARS Coronavirus 2 by RT PCR NEGATIVE NEGATIVE Final    Comment: (NOTE) SARS-CoV-2 target nucleic acids are NOT DETECTED.  The SARS-CoV-2 RNA is generally detectable in upper respiratory specimens during the acute phase of infection. The lowest concentration of SARS-CoV-2 viral copies this assay can detect is 138 copies/mL. A negative result does not preclude SARS-Cov-2 infection and should not be used as the sole basis for treatment or other patient management  decisions. A negative result may occur with  improper specimen collection/handling, submission of specimen other than nasopharyngeal swab, presence of viral mutation(s) within the areas targeted by this assay, and inadequate number of viral copies(<138 copies/mL). A negative result must be combined with clinical observations, patient history, and epidemiological information. The expected result is Negative.  Fact Sheet for Patients:  EntrepreneurPulse.com.au  Fact Sheet for Healthcare Providers:  IncredibleEmployment.be  This test is no t yet approved or cleared by the [place] FDA and  has been authorized for detection and/or diagnosis of SARS-CoV-2 by FDA under an Emergency Use Authorization (EUA). This EUA will remain  in effect (meaning this test can be used) for the duration of the COVID-19 declaration under Section 564(b)(1) of the Act, 21 U.S.C.section 360bbb-3(b)(1), unless the authorization is terminated  or revoked sooner.       Influenza A by PCR NEGATIVE NEGATIVE Final   Influenza B by PCR NEGATIVE NEGATIVE Final    Comment: (NOTE) The Xpert Xpress SARS-CoV-2/FLU/RSV plus assay is intended as an aid in the diagnosis of influenza from Nasopharyngeal swab specimens and should not be used as a sole basis for treatment. Nasal washings and aspirates are unacceptable for Xpert Xpress SARS-CoV-2/FLU/RSV testing.  Fact Sheet for Patients: EntrepreneurPulse.com.au  Fact Sheet for Healthcare Providers: IncredibleEmployment.be  This test is not yet approved or cleared by the [place] FDA and has been authorized for detection and/or diagnosis of SARS-CoV-2 by FDA under an Emergency Use Authorization (EUA). This EUA will remain in effect (meaning this test can be used) for the duration of the COVID-19 declaration under Section 564(b)(1) of the Act, 21 U.S.C. section 360bbb-3(b)(1), unless the  authorization is terminated or revoked.  Performed at [facility], [place] [address], [place], [place] [address]   Blood culture (  routine single)     Status: None   Collection Time: 06/28/20  4:23 PM   Specimen: BLOOD  Result Value Ref Range Status   Specimen Description BLOOD SITE NOT SPECIFIED  Final   Special Requests   Final    BOTTLES DRAWN AEROBIC AND ANAEROBIC Blood Culture results may not be optimal due to an inadequate volume of blood received in culture bottles   Culture   Final    NO GROWTH 5 DAYS Performed at [facility], [place] [address], [place], [place] [address]    Report Status 07/03/2020 FINAL  Final  Urine culture     Status: None   Collection Time: 06/28/20  8:50 PM   Specimen: In/Out Cath Urine  Result Value Ref Range Status   Specimen Description IN/OUT CATH URINE  Final   Special Requests NONE  Final   Culture   Final    NO GROWTH Performed at [facility], [place] [address] [place], [place] [address]    Report Status 06/30/2020 FINAL  Final  MRSA PCR Screening     Status: None   Collection Time: 06/29/20  6:58 AM   Specimen: Nasal Mucosa; Nasopharyngeal  Result Value Ref Range Status   MRSA by PCR NEGATIVE NEGATIVE Final    Comment:        The GeneXpert MRSA Assay (FDA approved for NASAL specimens only), is one component of a comprehensive MRSA colonization surveillance program. It is not intended to diagnose MRSA infection nor to guide or monitor treatment for MRSA infections. Performed at [facility], [place] [address] [place], [place] [address]     Radiology Reports  CT Angio Chest PE W/Cm &/Or Wo Cm  Result Date: 06/28/2020 CLINICAL DATA:  PE suspected, high prob Shortness of breath.  Weakness.  Tachycardia. EXAM: CT ANGIOGRAPHY CHEST WITH CONTRAST TECHNIQUE: Multidetector CT imaging of the chest was performed using the standard protocol during bolus administration of intravenous contrast. Multiplanar CT image  reconstructions and MIPs were obtained to evaluate the vascular anatomy. CONTRAST:  45mL OMNIPAQUE IOHEXOL 350 MG/ML SOLN COMPARISON:  Radiograph earlier this day.  Chest CTA 01/14/2020 FINDINGS: Cardiovascular: There are no filling defects within the pulmonary arteries to suggest pulmonary embolus. Aortic atherosclerosis. Conventional branching pattern from the aortic arch. No evidence of dissection or acute aortic findings. Normal heart size. Small amount of pericardial fluid anteriorly is similar to prior exam. Coronary artery calcifications. Mediastinum/Nodes: No enlarged mediastinal or hilar lymph nodes. No thyroid nodule. No esophageal wall thickening. Lungs/Pleura: Linear right upper lobe scarring is unchanged from prior exam. Previous pleural effusions have resolved. Previous septal thickening has resolved. No acute airspace disease. Trachea and central bronchi are patent. Mild central bronchial thickening. Upper Abdomen: No acute findings. Vascular calcifications. Suspected hepatic steatosis. Musculoskeletal: There are no acute or suspicious osseous abnormalities. Review of the MIP images confirms the above findings. IMPRESSION: 1. No pulmonary embolus. 2. Mild central bronchial thickening, can be seen with bronchitis or reactive airways disease. 3. Right upper lobe scarring. 4. Aortic atherosclerosis.  Coronary artery calcifications. Aortic Atherosclerosis (ICD10-I70.0). Electronically Signed   By: Keith Rake M.D.   On: 06/28/2020 19:45     DG Chest Port 1 View  Result Date: 06/28/2020 CLINICAL DATA:  Fever and shortness of breath. EXAM: PORTABLE CHEST 1 VIEW COMPARISON:  June 28, 2020 (4:44 p.m.) FINDINGS: Decreased lung volumes are again seen with mild diffusely increased interstitial lung markings. This represents a new finding when compared  to the prior study. Mild areas of atelectasis and/or infiltrate are seen within the bilateral infrahilar regions and medial aspect of the upper left  lung. There is no evidence of a pleural effusion or pneumothorax. The heart size and mediastinal contours are within normal limits. The visualized skeletal structures are unremarkable. IMPRESSION: 1. Interval development of mild left upper lobe and bilateral infrahilar atelectasis and/or infiltrate since the prior study. 2. Additional findings consistent with mild interstitial edema. Electronically Signed   By: Virgina Norfolk M.D.   On: 06/28/2020 22:29   DG Chest Port 1 View  Result Date: 06/28/2020 CLINICAL DATA:  Questionable sepsis, fever, shortness of breath, weakness, tachycardia EXAM: PORTABLE CHEST 1 VIEW COMPARISON:  01/13/2020 FINDINGS: Cardiomegaly. Low volume AP portable examination without acute airspace opacity. The visualized skeletal structures are unremarkable. IMPRESSION: Cardiomegaly without acute abnormality of the lungs in AP portable projection. Electronically Signed   By: Eddie Candle M.D.   On: 06/28/2020 16:56

## 2020-07-06 ENCOUNTER — Other Ambulatory Visit (HOSPITAL_COMMUNITY): Payer: Self-pay

## 2020-07-06 DIAGNOSIS — R6520 Severe sepsis without septic shock: Secondary | ICD-10-CM | POA: Diagnosis not present

## 2020-07-06 DIAGNOSIS — A419 Sepsis, unspecified organism: Secondary | ICD-10-CM | POA: Diagnosis not present

## 2020-07-06 LAB — COMPREHENSIVE METABOLIC PANEL
ALT: 13 U/L (ref 0–44)
AST: 15 U/L (ref 15–41)
Albumin: 2.8 g/dL — ABNORMAL LOW (ref 3.5–5.0)
Alkaline Phosphatase: 78 U/L (ref 38–126)
Anion gap: 8 (ref 5–15)
BUN: 26 mg/dL — ABNORMAL HIGH (ref 8–23)
CO2: 25 mmol/L (ref 22–32)
Calcium: 9.5 mg/dL (ref 8.9–10.3)
Chloride: 102 mmol/L (ref 98–111)
Creatinine, Ser: 1.5 mg/dL — ABNORMAL HIGH (ref 0.44–1.00)
GFR, Estimated: 37 mL/min — ABNORMAL LOW (ref >60)
Glucose, Bld: 224 mg/dL — ABNORMAL HIGH (ref 70–99)
Potassium: 4.1 mmol/L (ref 3.5–5.1)
Sodium: 135 mmol/L (ref 135–145)
Total Bilirubin: 0.4 mg/dL (ref 0.3–1.2)
Total Protein: 6.2 g/dL — ABNORMAL LOW (ref 6.5–8.1)

## 2020-07-06 LAB — CBC
HCT: 30.2 % — ABNORMAL LOW (ref 36.0–46.0)
Hemoglobin: 9.9 g/dL — ABNORMAL LOW (ref 12.0–15.0)
MCH: 32 pg (ref 26.0–34.0)
MCHC: 32.8 g/dL (ref 30.0–36.0)
MCV: 97.7 fL (ref 80.0–100.0)
Platelets: 595 10*3/uL — ABNORMAL HIGH (ref 150–400)
RBC: 3.09 MIL/uL — ABNORMAL LOW (ref 3.87–5.11)
RDW: 17.7 % — ABNORMAL HIGH (ref 11.5–15.5)
WBC: 9.4 10*3/uL (ref 4.0–10.5)
nRBC: 0 % (ref 0.0–0.2)

## 2020-07-06 LAB — MAGNESIUM: Magnesium: 2 mg/dL (ref 1.7–2.4)

## 2020-07-06 LAB — BRAIN NATRIURETIC PEPTIDE: B Natriuretic Peptide: 286.4 pg/mL — ABNORMAL HIGH (ref 0.0–100.0)

## 2020-07-06 LAB — GLUCOSE, CAPILLARY: Glucose-Capillary: 215 mg/dL — ABNORMAL HIGH (ref 70–99)

## 2020-07-06 SURGERY — CORONARY STENT INTERVENTION
Anesthesia: LOCAL

## 2020-07-06 MED ORDER — FENOFIBRATE 160 MG PO TABS: 160.0000 mg | ORAL_TABLET | Freq: Every day | ORAL | 0 refills | Status: DC

## 2020-07-06 MED ORDER — TICAGRELOR 90 MG PO TABS
90.0000 mg | ORAL_TABLET | Freq: Two times a day (BID) | ORAL | 0 refills | Status: DC
  Filled 2020-07-06: qty 60, 30d supply, fill #0

## 2020-07-06 MED ORDER — SODIUM CHLORIDE 0.9 % IV SOLN: INTRAVENOUS | Status: DC

## 2020-07-06 MED ORDER — ROPINIROLE HCL 0.5 MG PO TABS: 0.5000 mg | ORAL_TABLET | Freq: Two times a day (BID) | ORAL | 0 refills | Status: DC

## 2020-07-06 MED ORDER — METOPROLOL SUCCINATE ER 100 MG PO TB24: 100.0000 mg | ORAL_TABLET | Freq: Every day | ORAL | 0 refills | Status: DC

## 2020-07-06 MED ORDER — ASPIRIN 81 MG PO CHEW
81.0000 mg | CHEWABLE_TABLET | ORAL | Status: AC
  Administered 2020-07-06: 81 mg via ORAL
  Filled 2020-07-06: qty 1

## 2020-07-06 MED ORDER — TICAGRELOR 90 MG PO TABS: 90.0000 mg | ORAL_TABLET | Freq: Two times a day (BID) | ORAL | 0 refills | Status: DC

## 2020-07-06 NOTE — Discharge Summary (Signed)
Anna [name] FBP:[id number] DOB: 05/11/1950 DOA: 06/28/2020  PCP: Leanna Battles, MD  Admit date: 06/28/2020  Discharge date: 07/06/2020  Admitted From: Home   Disposition:  Home   Recommendations for Outpatient Follow-up:   Follow up with PCP in 1-2 weeks  PCP Please obtain BMP/CBC, 2 view CXR in 1week,  (see Discharge instructions)   PCP Please follow up on the following pending results: Monitor blood pressure, glycemic control, CBC and CMP cardiology and nephrology follow-up recommended.   Home Health: None   Equipment/Devices: None  Consultations: Cards Discharge Condition: Stable    CODE STATUS: Full    Diet Recommendation: Heart Healthy Low Carb, with 1.5lit/day fluid restriction  Diet Order            Diet NPO time specified Except for: Sips with Meds  Diet effective midnight                  Chief Complaint  Patient presents with  . Shortness of Breath  . Weakness  . Tachycardia  . Fever     Brief history of present illness from the day of admission and additional interim summary    Anna Magnussen Bookeris a 70 y.o.femalewith medical history significant forrecurrent urinary tract infections, restless leg syndrome, hypertension, type 2 diabetes mellitus, stage III chronic kidney disease with baseline creatinine 1.1-1.4, presented to [facility] with high fever was diagnosed with severe sepsis due to UTI and admitted to the hospital, subsequently she developed CHF, echocardiogram showed she had an EF of 45%, underwent left heart catheterization showing CAD.                                                                 Hospital Course   1.  Severe sepsis due to current UTI.  High procalcitonin suggestive of possible bacteremia, has been placed on cefepime which will be continued,  monitor cultures and procalcitonin, hydrate with IV appears nontoxic sepsis pathophysiology has likely resolved, urine and blood cultures negative, renal ultrasound nonacute, procalcitonin is still elevated however appears nontoxic and has finished her ABX on 07/04/2018.  2.  Acute on chronic nonspecific CHF exacerbation night of 06/29/2020, echo shows acute on chronic systolic CHF with EF 82% along with some wall motion abnormality, cardiology on board, and has been adequately diuresed and currently compensated, medications as below.  3.  Non-ACS pattern elevation of troponin with sinus tachycardia, CAD on Cath.  Likely caused by sepsis creating demand mismatch, placed on aspirin and Brilinta, beta-blocker and statin, chest pain-free, echocardiogram as above,she underwent a diagnostic L.Cath 07/01/20 showing 2 vessel disease, due to AKI underlying CKD second cath has been postponed, discussed with cardiologist Dr. Percival Spanish recommended medical treatment for now with dual antiplatelet therapy, beta-blocker and statin outpatient cardiology follow-up  4.  Hypertension.  Placed on beta-blocker.  5.  Hypomagnesemia and hypokalemia.  Replaced and stable.  6.  Dyslipidemia.  On statin and added Tricor.  7. AKI on CKD 3A .  Baseline creatinine close to 1.4, had mild AKI, Lasix was discontinued along with home ARB, after gentle hydration renal function close to baseline PCP to monitor, she will benefit from outpatient nephrology follow-up, renal ultrasound was nonacute but did suggest possible underlying chronic renal disease.     8.  Ongoing smoking.  Counseled to quit.  9.DM 2 - home Rx but hold Glucophage due to CKD.  Lab Results  Component Value Date   HGBA1C 7.5 (H) 06/29/2020      Discharge diagnosis     Principal Problem:   Severe sepsis ([place]) Active Problems:   Type 2 diabetes mellitus with hyperglycemia (HCC)   Essential hypertension   Hyperlipidemia   Acute lower UTI   SOB  (shortness of breath)   Acute combined systolic and diastolic heart failure (HCC)   Dilated cardiomyopathy (HCC)   Elevated troponin    Discharge instructions    Discharge Instructions    Ambulatory referral to Physical Therapy   Complete by: As directed    Discharge instructions   Complete by: As directed    Follow with Primary MD Leanna Battles, MD in 7 days   Get CBC, CMP, 2 view Chest X ray -  checked next visit within 1 week by Primary MD    Activity: As tolerated with Full fall precautions use walker/cane & assistance as needed  Disposition Home    Diet: Heart Healthy  Low Carb, 1.5 lit/day fluid restriction.   Accuchecks 4 times/day, Once in AM empty stomach and then before each meal. Log in all results and show them to your Prim.MD in 3 days. If any glucose reading is under 80 or above 300 call your Prim MD immidiately. Follow Low glucose instructions for glucose under 80 as instructed.  Special Instructions: If you have smoked or chewed Tobacco  in the last 2 yrs please stop smoking, stop any regular Alcohol  and or any Recreational drug use.  On your next visit with your primary care physician please Get Medicines reviewed and adjusted.  Please request your Prim.MD to go over all Hospital Tests and Procedure/Radiological results at the follow up, please get all Hospital records sent to your Prim MD by signing hospital release before you go home.  If you experience worsening of your admission symptoms, develop shortness of breath, life threatening emergency, suicidal or homicidal thoughts you must seek medical attention immediately by calling 911 or calling your MD immediately  if symptoms less severe.  You Must read complete instructions/literature along with all the possible adverse reactions/side effects for all the Medicines you take and that have been prescribed to you. Take any new Medicines after you have completely understood and accpet all the possible adverse  reactions/side effects.   Increase activity slowly   Complete by: As directed       Discharge Medications   Allergies as of 07/06/2020      Reactions   Benadryl [diphenhydramine Hcl] Other (See Comments)   chills      Medication List    STOP taking these medications   amLODipine-olmesartan 5-20 MG tablet Commonly known as: AZOR   celecoxib 200 MG capsule Commonly known as: CELEBREX   gabapentin 100 MG capsule Commonly known as: NEURONTIN   metFORMIN 500 MG 24 hr tablet Commonly known as: GLUCOPHAGE-XR  TAKE these medications   acetaminophen 325 MG tablet Commonly known as: TYLENOL Take 2 tablets (650 mg total) by mouth every 6 (six) hours as needed for mild pain, fever or headache.   ascorbic acid 500 MG tablet Commonly known as: VITAMIN C Take 500 mg by mouth daily.   aspirin EC 81 MG tablet Take 81 mg by mouth daily.   atorvastatin 40 MG tablet Commonly known as: LIPITOR Take 40 mg by mouth daily.   calcium carbonate 1500 (600 Ca) MG Tabs tablet Commonly known as: OSCAL Take 600 mg by mouth daily.   fenofibrate 160 MG tablet Take 1 tablet (160 mg total) by mouth daily. Start taking on: July 07, 2020   GLUCOSAMINE PO Take 1,000 mg by mouth daily.   hydroxyurea 500 MG capsule Commonly known as: HYDREA Take 1 capsule (500 mg total) by mouth daily. May take with food to minimize GI side effects.   Magnesium 200 MG Tabs Take 200 mg by mouth daily.   metoprolol succinate 100 MG 24 hr tablet Commonly known as: TOPROL-XL Take 1 tablet (100 mg total) by mouth daily. Take with or immediately following a meal. Start taking on: July 07, 2020   pioglitazone 15 MG tablet Commonly known as: ACTOS Take 15 mg by mouth daily.   pregabalin 50 MG capsule Commonly known as: LYRICA Take 50 mg by mouth in the morning and at bedtime.   saccharomyces boulardii 250 MG capsule Commonly known as: FLORASTOR Take 250 mg by mouth daily.   ticagrelor 90 MG Tabs  tablet Commonly known as: BRILINTA Take 1 tablet (90 mg total) by mouth 2 (two) times daily.        Follow-up [place] Follow up.   Specialty: Rehabilitation Why: They will call you to set an appointment for outpatient physical therapy.  Contact information: [place]. [id number] [place] [phone]       [facility]. Go on 07/14/2020.   Specialty: Cardiology Why: AT 2pm. [facility] ([facility]. FREE valet parking at [employer], off [address] all medications and pill box with you.  Contact information: [address] [phone] mc [address] [place]       Leanna Battles, MD. Schedule an appointment as soon as possible for a visit in 1 week(s).   Specialty: Internal Medicine Contact information: [place] [address] [phone]        Werner Lean, MD .   Specialty: Cardiology Contact information: [address] [place] [address] [phone]        Gean Quint, MD. Schedule an appointment as soon as possible for a visit in 1 week(s).   Specialty: Nephrology Contact information: [place] [address] [phone]               Major procedures and Radiology Reports - PLEASE review detailed and final reports thoroughly  -       CT Angio Chest PE W/Cm &/Or Wo Cm  Result Date: 06/28/2020 CLINICAL DATA:  PE suspected, high prob Shortness of breath.  Weakness.  Tachycardia. EXAM: CT ANGIOGRAPHY CHEST WITH CONTRAST TECHNIQUE: Multidetector CT imaging of the chest was performed using the standard protocol during bolus administration of intravenous contrast. Multiplanar CT image reconstructions and MIPs were obtained to evaluate the vascular anatomy. CONTRAST:  18mL OMNIPAQUE  IOHEXOL 350 MG/ML  SOLN COMPARISON:  Radiograph earlier this day.  Chest CTA 01/14/2020 FINDINGS: Cardiovascular: There are no filling defects within the pulmonary arteries to suggest pulmonary embolus. Aortic atherosclerosis. Conventional branching pattern from the aortic arch. No evidence of dissection or acute aortic findings. Normal heart size. Small amount of pericardial fluid anteriorly is similar to prior exam. Coronary artery calcifications. Mediastinum/Nodes: No enlarged mediastinal or hilar lymph nodes. No thyroid nodule. No esophageal wall thickening. Lungs/Pleura: Linear right upper lobe scarring is unchanged from prior exam. Previous pleural effusions have resolved. Previous septal thickening has resolved. No acute airspace disease. Trachea and central bronchi are patent. Mild central bronchial thickening. Upper Abdomen: No acute findings. Vascular calcifications. Suspected hepatic steatosis. Musculoskeletal: There are no acute or suspicious osseous abnormalities. Review of the MIP images confirms the above findings. IMPRESSION: 1. No pulmonary embolus. 2. Mild central bronchial thickening, can be seen with bronchitis or reactive airways disease. 3. Right upper lobe scarring. 4. Aortic atherosclerosis.  Coronary artery calcifications. Aortic Atherosclerosis (ICD10-I70.0). Electronically Signed   By: Keith Rake M.D.   On: 06/28/2020 19:45   CT LUMBAR SPINE W CONTRAST  Result Date: 06/14/2020 CLINICAL DATA:  Lumbar radiculopathy. Right-sided low back/buttock pain radiating to the anterior right thigh. EXAM: LUMBAR MYELOGRAM FLUOROSCOPY TIME:  Fluoroscopy Time: 56 seconds Radiation Exposure Index: 227.09 microGray*m^2 PROCEDURE: After thorough discussion of risks and benefits of the procedure including bleeding, infection, injury to nerves, blood vessels, adjacent structures as well as headache and CSF leak, written and oral informed consent was obtained. Consent was obtained by Dr. Logan Bores. Time out form was  completed. Patient was positioned prone on the fluoroscopy table. Local anesthesia was provided with 1% lidocaine without epinephrine after prepped and draped in the usual sterile fashion. Puncture was performed at L5-S1 using a 3 1/2 inch 22-gauge spinal needle via a left interlaminar approach. Using a single pass through the dura, the needle was placed within the thecal sac, with return of clear CSF. 15 mL of Isovue M-200 was injected into the thecal sac, with normal opacification of the nerve roots and cauda equina consistent with free flow within the subarachnoid space. I personally performed the lumbar puncture and administered the intrathecal contrast. I also personally supervised acquisition of the myelogram images. TECHNIQUE: Contiguous axial images were obtained through the Lumbar spine after the intrathecal infusion of contrast. Coronal and sagittal reconstructions were obtained of the axial image sets. COMPARISON:  Lumbar spine MRI 01/10/2020 FINDINGS: LUMBAR MYELOGRAM FINDINGS: There are 5 non rib-bearing lumbar type vertebrae. Minimal retrolisthesis of L2 on L3 and L3 on L4 do not change with flexion or extension. There is a moderate-sized ventral extradural defect at L3-4 with evidence of asymmetric right lateral recess stenosis effacing the right L4 nerve root. There is also evidence of right lateral recess stenosis at L2-3 affecting the right L3 nerve root. CT LUMBAR MYELOGRAM FINDINGS: Minimal retrolisthesis of L2 on L3 and L3 on L4 unchanged from the prior MRI. There is mild levoscoliosis with apex at L2-3. Large Schmorl's nodes involving the L2 superior and inferior endplates are unchanged. There is an L1 superior endplate compression fracture which is new from the prior MRI with 10% anterior vertebral body height loss and mild sclerosis along the superior endplate. A subcentimeter sclerotic focus in the T12 vertebral body is unchanged and may reflect a bone island. There is vacuum disc at every  level from T12-L1 to L5-S1 with exception of L1-2. There is mild-to-moderate right-sided disc  space narrowing at L3-4 with mild narrowing at L1-2 and L2-3. The conus medullaris terminates at L1. There is abdominal aortic atherosclerosis without aneurysm. T12-L1: Small calcified right central disc protrusion and moderate facet arthrosis without stenosis, unchanged. L1-2: Left eccentric disc bulging, endplate spurring, and facet arthrosis without stenosis, unchanged. Bilateral facet ankylosis. L2-3: Right eccentric disc bulging, a right subarticular to right extraforaminal disc osteophyte complex, and moderate facet hypertrophy result in moderate right lateral recess stenosis and moderate right neural foraminal stenosis without spinal stenosis, unchanged. Potential right L3 nerve root impingement. L3-4: Prior right laminectomy. There is a new moderately large right paracentral and subarticular disc protrusion containing gas which results in recurrent right lateral recess stenosis and potential right L4 nerve root impingement. Right eccentric disc bulging, endplate spurring, and moderate right and severe left facet hypertrophy result in mild left lateral recess stenosis and moderate to severe right and mild-to-moderate left neural foraminal stenosis with potential right L3 nerve root impingement, unchanged. No significant generalized spinal stenosis. L4-5: Prior right laminectomy. Left eccentric disc bulging and mild right and severe left facet hypertrophy result in mild right and moderate left neural foraminal stenosis without spinal stenosis, unchanged. L5-S1: Disc bulging, endplate spurring, and severe facet hypertrophy result in mild left neural foraminal stenosis without spinal stenosis, unchanged. IMPRESSION: 1. Recurrent right paracentral and subarticular disc protrusion at L3-4 with right lateral recess stenosis and potential right L4 nerve root impingement. 2. Unchanged moderate right lateral recess and  moderate right neural foraminal stenosis at L2-3. 3. New mild L1 compression fracture, potentially subacute. 4. Aortic Atherosclerosis (ICD10-I70.0). Electronically Signed   By: Logan Bores M.D.   On: 06/14/2020 15:48   CARDIAC CATHETERIZATION  Result Date: 07/01/2020  Ost LAD to Prox LAD lesion is 60% stenosed. Prox LAD to Mid LAD lesion is 70% stenosed with 50% stenosed side branch in 1st Sept.  Mid LAD lesion is 70% stenosed.  Prox Cx to Mid Cx lesion is 40% stenosed with 55% stenosed side branch in 1st Mrg.  2nd Mrg lesion is 80% stenosed.  LV end diastolic pressure is severely elevated.  Hemodynamic findings consistent with mild pulmonary hypertension.  Hemodynamic findings are consistent with severe cardiomyopathy  Hemodynamics consistent ACUTE COMBINED SYSTOLIC AND DIASTOLIC HEART FAILURE  SUMMARY  Severe two-vessel disease (codominant system);  extensive LAD disease: Ostial (60%), proximal (segmental 70%)  and focal mid 70% just after major 1st Diag branch  Focal eccentric 80% 2nd Mrg  Mild Secondary Pulmonary Hypertension with mean PAP of 26 mmHg, PCWP and LVEDP of 26 and 27 mmHg.  RVEDP and RAP 23mmHg  Moderate to severely reduced cardiac Output-Index: (Fick) 4.45-2.38; Thermodilution's 2.96-1.58.  Findings consistent with ACUTE COMBINED SYSTOLIC AND DIASTOLIC HEART FAILURE with likely ischemic contribution, but EF out of portion due to existing CAD. RECOMMENDATIONS:  The patient be transferred to cardiac unit for post-cath care.  As per discussion with Dr. Gasper Sells -> we will diurese starting with 40 mg IV Lasix in the Cath Lab followed by 40 mg IV twice daily starting tomorrow.  Initiate GDMT for ICM and load with Brilinta over the weekend  Once stable from a volume and renal function standpoint, would anticipate staged PCI of the LAD and OM 2 early next week. Glenetta Hew, MD   US RENAL  Result Date: 06/29/2020 CLINICAL DATA:  Acute kidney injury EXAM: RENAL / URINARY  TRACT ULTRASOUND COMPLETE COMPARISON:  Ultrasound 01/09/2019 FINDINGS: Right Kidney: Renal measurements: 9.4 x 4.2 x 3.9 cm = volume:  81.5 mL. Cortical echogenicity is within normal limits. No mass or hydronephrosis. Possible areas of cortical thinning and scarring. Left Kidney: Renal measurements: 10.5 x 4.9 x 4 cm = volume: 107.1 mL. Cortical echogenicity within normal limits possible cortical thinning at the upper pole. Bladder: Appears normal for degree of bladder distention. Other: None. IMPRESSION: 1. Negative for hydronephrosis. 2. Probable cortical thinning upper pole left kidney and possible cortical thinning scarring in the right kidney Electronically Signed   By: Donavan Foil M.D.   On: 06/29/2020 19:43   DG Chest Port 1 View  Result Date: 06/30/2020 CLINICAL DATA:  Shortness of breath EXAM: PORTABLE CHEST 1 VIEW COMPARISON:  06/29/2020 FINDINGS: Cardiac shadow is stable. Significant improved aeration is noted bilaterally with the exception of some mild right basilar atelectasis. No new focal abnormality is seen. IMPRESSION: Mild right basilar atelectasis. Otherwise significant improved aeration bilaterally Electronically Signed   By: Inez Catalina M.D.   On: 06/30/2020 08:55   DG CHEST PORT 1 VIEW  Result Date: 06/29/2020 CLINICAL DATA:  Dyspnea. EXAM: PORTABLE CHEST 1 VIEW COMPARISON:  Radiographs and CT earlier today. FINDINGS: Stable heart size and mediastinal contours. Again seen diffuse bronchial thickening. No acute or progressive airspace disease. Linear scarring in the left mid lung. No pleural effusion or pneumothorax. Stable osseous structures. IMPRESSION: Diffuse bronchial thickening, unchanged from CT earlier this day. No acute or progressive airspace disease. Electronically Signed   By: Keith Rake M.D.   On: 06/29/2020 21:17   DG Chest Port 1 View  Result Date: 06/28/2020 CLINICAL DATA:  Fever and shortness of breath. EXAM: PORTABLE CHEST 1 VIEW COMPARISON:  June 28, 2020  (4:44 p.m.) FINDINGS: Decreased lung volumes are again seen with mild diffusely increased interstitial lung markings. This represents a new finding when compared to the prior study. Mild areas of atelectasis and/or infiltrate are seen within the bilateral infrahilar regions and medial aspect of the upper left lung. There is no evidence of a pleural effusion or pneumothorax. The heart size and mediastinal contours are within normal limits. The visualized skeletal structures are unremarkable. IMPRESSION: 1. Interval development of mild left upper lobe and bilateral infrahilar atelectasis and/or infiltrate since the prior study. 2. Additional findings consistent with mild interstitial edema. Electronically Signed   By: Virgina Norfolk M.D.   On: 06/28/2020 22:29   DG Chest Port 1 View  Result Date: 06/28/2020 CLINICAL DATA:  Questionable sepsis, fever, shortness of breath, weakness, tachycardia EXAM: PORTABLE CHEST 1 VIEW COMPARISON:  01/13/2020 FINDINGS: Cardiomegaly. Low volume AP portable examination without acute airspace opacity. The visualized skeletal structures are unremarkable. IMPRESSION: Cardiomegaly without acute abnormality of the lungs in AP portable projection. Electronically Signed   By: Eddie Candle M.D.   On: 06/28/2020 16:56   DG MYELOGRAPHY LUMBAR INJ LUMBOSACRAL  Result Date: 06/14/2020 CLINICAL DATA:  Lumbar radiculopathy. Right-sided low back/buttock pain radiating to the anterior right thigh. EXAM: LUMBAR MYELOGRAM FLUOROSCOPY TIME:  Fluoroscopy Time: 56 seconds Radiation Exposure Index: 227.09 microGray*m^2 PROCEDURE: After thorough discussion of risks and benefits of the procedure including bleeding, infection, injury to nerves, blood vessels, adjacent structures as well as headache and CSF leak, written and oral informed consent was obtained. Consent was obtained by Dr. Logan Bores. Time out form was completed. Patient was positioned prone on the fluoroscopy table. Local anesthesia  was provided with 1% lidocaine without epinephrine after prepped and draped in the usual sterile fashion. Puncture was performed at L5-S1 using a 3 1/2 inch 22-gauge  spinal needle via a left interlaminar approach. Using a single pass through the dura, the needle was placed within the thecal sac, with return of clear CSF. 15 mL of Isovue M-200 was injected into the thecal sac, with normal opacification of the nerve roots and cauda equina consistent with free flow within the subarachnoid space. I personally performed the lumbar puncture and administered the intrathecal contrast. I also personally supervised acquisition of the myelogram images. TECHNIQUE: Contiguous axial images were obtained through the Lumbar spine after the intrathecal infusion of contrast. Coronal and sagittal reconstructions were obtained of the axial image sets. COMPARISON:  Lumbar spine MRI 01/10/2020 FINDINGS: LUMBAR MYELOGRAM FINDINGS: There are 5 non rib-bearing lumbar type vertebrae. Minimal retrolisthesis of L2 on L3 and L3 on L4 do not change with flexion or extension. There is a moderate-sized ventral extradural defect at L3-4 with evidence of asymmetric right lateral recess stenosis effacing the right L4 nerve root. There is also evidence of right lateral recess stenosis at L2-3 affecting the right L3 nerve root. CT LUMBAR MYELOGRAM FINDINGS: Minimal retrolisthesis of L2 on L3 and L3 on L4 unchanged from the prior MRI. There is mild levoscoliosis with apex at L2-3. Large Schmorl's nodes involving the L2 superior and inferior endplates are unchanged. There is an L1 superior endplate compression fracture which is new from the prior MRI with 10% anterior vertebral body height loss and mild sclerosis along the superior endplate. A subcentimeter sclerotic focus in the T12 vertebral body is unchanged and may reflect a bone island. There is vacuum disc at every level from T12-L1 to L5-S1 with exception of L1-2. There is mild-to-moderate  right-sided disc space narrowing at L3-4 with mild narrowing at L1-2 and L2-3. The conus medullaris terminates at L1. There is abdominal aortic atherosclerosis without aneurysm. T12-L1: Small calcified right central disc protrusion and moderate facet arthrosis without stenosis, unchanged. L1-2: Left eccentric disc bulging, endplate spurring, and facet arthrosis without stenosis, unchanged. Bilateral facet ankylosis. L2-3: Right eccentric disc bulging, a right subarticular to right extraforaminal disc osteophyte complex, and moderate facet hypertrophy result in moderate right lateral recess stenosis and moderate right neural foraminal stenosis without spinal stenosis, unchanged. Potential right L3 nerve root impingement. L3-4: Prior right laminectomy. There is a new moderately large right paracentral and subarticular disc protrusion containing gas which results in recurrent right lateral recess stenosis and potential right L4 nerve root impingement. Right eccentric disc bulging, endplate spurring, and moderate right and severe left facet hypertrophy result in mild left lateral recess stenosis and moderate to severe right and mild-to-moderate left neural foraminal stenosis with potential right L3 nerve root impingement, unchanged. No significant generalized spinal stenosis. L4-5: Prior right laminectomy. Left eccentric disc bulging and mild right and severe left facet hypertrophy result in mild right and moderate left neural foraminal stenosis without spinal stenosis, unchanged. L5-S1: Disc bulging, endplate spurring, and severe facet hypertrophy result in mild left neural foraminal stenosis without spinal stenosis, unchanged. IMPRESSION: 1. Recurrent right paracentral and subarticular disc protrusion at L3-4 with right lateral recess stenosis and potential right L4 nerve root impingement. 2. Unchanged moderate right lateral recess and moderate right neural foraminal stenosis at L2-3. 3. New mild L1 compression  fracture, potentially subacute. 4. Aortic Atherosclerosis (ICD10-I70.0). Electronically Signed   By: Logan Bores M.D.   On: 06/14/2020 15:48   ECHOCARDIOGRAM COMPLETE  Result Date: 06/30/2020    ECHOCARDIOGRAM REPORT   Patient Name:   DIELLA GILLINGHAM Date of Exam: 06/29/2020 Medical Rec #:  [id number]      Height:       64.0 in Accession #:    [id number]     Weight:       157.8 lb Date of Birth:  [birth year]      BSA:          1.769 m Patient Age:    22 years       BP:           110/75 mmHg Patient Gender: F              HR:           115 bpm. Exam Location:  Inpatient Procedure: 2D Echo, Cardiac Doppler and Color Doppler Indications:    R01.1 Murmur  History:        Patient has no prior history of Echocardiogram examinations.                 Signs/Symptoms:Shortness of Breath.  Sonographer:    [name] RDCS Referring Phys: [id number]  1. Left ventricular ejection fraction, by estimation, is 35 to 40%. Left ventricular ejection fraction by 3D volume is 37 %. The left ventricle has moderately decreased function. The left ventricle demonstrates regional wall motion abnormalities (see scoring diagram/findings for description) in the mid and apical distribution. This can be seen in stress mediated cardiomyopathy. No LV thrombus visualized. There is mild concentric left ventricular hypertrophy. Indeterminate diastolic filling due to E-A  fusion.  2. Right ventricular systolic function is normal. The right ventricular size is normal.  3. A small pericardial effusion is present.  4. The mitral valve is grossly normal. Trivial mitral valve regurgitation.  5. The aortic valve is tricuspid. There is mild calcification of the aortic valve. Aortic valve regurgitation is not visualized. Mild aortic valve sclerosis is present, with no evidence of aortic valve stenosis.  6. The inferior vena cava is normal in size with greater than 50% respiratory variability, suggesting right atrial pressure of 3 mmHg.  Comparison(s): No prior Echocardiogram. FINDINGS  Left Ventricle: Left ventricular ejection fraction, by estimation, is 35 to 40%. Left ventricular ejection fraction by 3D volume is 37 %. The left ventricle has moderately decreased function. The left ventricle demonstrates regional wall motion abnormalities. The left ventricular internal cavity size was normal in size. There is mild concentric left ventricular hypertrophy. Indeterminate diastolic filling due to E-A fusion.  LV Wall Scoring: The mid and distal lateral wall, mid and distal anterior septum, entire apex, mid anterolateral segment, and mid inferoseptal segment are hypokinetic. Right Ventricle: The right ventricular size is normal. No increase in right ventricular wall thickness. Right ventricular systolic function is normal. Left Atrium: Left atrial size was normal in size. Right Atrium: Right atrial size was normal in size. Pericardium: A small pericardial effusion is present. Mitral Valve: The mitral valve is grossly normal. Trivial mitral valve regurgitation. Tricuspid Valve: The tricuspid valve is normal in structure. Tricuspid valve regurgitation is not demonstrated. No evidence of tricuspid stenosis. Aortic Valve: The aortic valve is tricuspid. There is mild calcification of the aortic valve. Aortic valve regurgitation is not visualized. Mild aortic valve sclerosis is present, with no evidence of aortic valve stenosis. Aortic valve mean gradient measures 6.0 mmHg. Aortic valve peak gradient measures 10.8 mmHg. Aortic valve area, by VTI measures 1.13 cm. Pulmonic Valve: The pulmonic valve was grossly normal. Pulmonic valve regurgitation is not visualized. No evidence of pulmonic stenosis. Aorta: The aortic root is normal in  size and structure. Venous: The inferior vena cava is normal in size with greater than 50% respiratory variability, suggesting right atrial pressure of 3 mmHg. IAS/Shunts: The atrial septum is grossly normal.  LEFT VENTRICLE  PLAX 2D LVIDd:         5.00 cm LVIDs:         3.70 cm LV PW:         1.10 cm         3D Volume EF LV IVS:        1.10 cm         LV 3D EF:    Left LVOT diam:     1.80 cm                      ventricular LV SV:         28                           ejection LV SV Index:   16                           fraction by LVOT Area:     2.54 cm                     3D volume                                             is 37 %.  LV Volumes (MOD) LV vol d, MOD    77.8 ml       3D Volume EF: A2C:                           3D EF:        37 % LV vol d, MOD    63.9 ml       LV EDV:       146 ml A4C:                           LV ESV:       91 ml LV vol s, MOD    52.6 ml       LV SV:        54 ml A2C: LV vol s, MOD    50.2 ml A4C: LV SV MOD A2C:   25.2 ml LV SV MOD A4C:   63.9 ml LV SV MOD BP:    22.8 ml RIGHT VENTRICLE             IVC RV Basal diam:  2.90 cm     IVC diam: 1.80 cm RV S prime:     11.60 cm/s TAPSE (M-mode): 1.6 cm LEFT ATRIUM             Index       RIGHT ATRIUM           Index LA diam:        4.20 cm 2.37 cm/m  RA Area:     10.70 cm LA Vol (A2C):   46.6 ml 26.34 ml/m RA Volume:   24.10 ml  13.62 ml/m LA Vol (A4C):   41.2 ml 23.29 ml/m LA Biplane Vol:  46.8 ml 26.45 ml/m  AORTIC VALVE AV Area (Vmax):    1.20 cm AV Area (Vmean):   1.17 cm AV Area (VTI):     1.13 cm AV Vmax:           164.00 cm/s AV Vmean:          115.000 cm/s AV VTI:            0.246 m AV Peak Grad:      10.8 mmHg AV Mean Grad:      6.0 mmHg LVOT Vmax:         77.10 cm/s LVOT Vmean:        53.100 cm/s LVOT VTI:          0.109 m LVOT/AV VTI ratio: 0.44  AORTA Ao Root diam: 2.60 cm  SHUNTS Systemic VTI:  0.11 m Systemic Diam: 1.80 cm Rudean Haskell MD Electronically signed by Rudean Haskell MD Signature Date/Time: 06/30/2020/9:19:12 AM    Final     Micro Results     Recent Results (from the past 240 hour(s))  Resp Panel by RT-PCR (Flu A&B, Covid) Nasopharyngeal Swab     Status: None   Collection Time: 06/28/20  4:20 PM    Specimen: Nasopharyngeal Swab; Nasopharyngeal(NP) swabs in vial transport medium  Result Value Ref Range Status   SARS Coronavirus 2 by RT PCR NEGATIVE NEGATIVE Final    Comment: (NOTE) SARS-CoV-2 target nucleic acids are NOT DETECTED.  The SARS-CoV-2 RNA is generally detectable in upper respiratory specimens during the acute phase of infection. The lowest concentration of SARS-CoV-2 viral copies this assay can detect is 138 copies/mL. A negative result does not preclude SARS-Cov-2 infection and should not be used as the sole basis for treatment or other patient management decisions. A negative result may occur with  improper specimen collection/handling, submission of specimen other than nasopharyngeal swab, presence of viral mutation(s) within the areas targeted by this assay, and inadequate number of viral copies(<138 copies/mL). A negative result must be combined with clinical observations, patient history, and epidemiological information. The expected result is Negative.  Fact Sheet for Patients:  EntrepreneurPulse.com.au  Fact Sheet for Healthcare Providers:  IncredibleEmployment.be  This test is no t yet approved or cleared by the [place] FDA and  has been authorized for detection and/or diagnosis of SARS-CoV-2 by FDA under an Emergency Use Authorization (EUA). This EUA will remain  in effect (meaning this test can be used) for the duration of the COVID-19 declaration under Section 564(b)(1) of the Act, 21 U.S.C.section 360bbb-3(b)(1), unless the authorization is terminated  or revoked sooner.       Influenza A by PCR NEGATIVE NEGATIVE Final   Influenza B by PCR NEGATIVE NEGATIVE Final    Comment: (NOTE) The Xpert Xpress SARS-CoV-2/FLU/RSV plus assay is intended as an aid in the diagnosis of influenza from Nasopharyngeal swab specimens and should not be used as a sole basis for treatment. Nasal washings and aspirates are  unacceptable for Xpert Xpress SARS-CoV-2/FLU/RSV testing.  Fact Sheet for Patients: EntrepreneurPulse.com.au  Fact Sheet for Healthcare Providers: IncredibleEmployment.be  This test is not yet approved or cleared by the [place] FDA and has been authorized for detection and/or diagnosis of SARS-CoV-2 by FDA under an Emergency Use Authorization (EUA). This EUA will remain in effect (meaning this test can be used) for the duration of the COVID-19 declaration under Section 564(b)(1) of the Act, 21 U.S.C. section 360bbb-3(b)(1), unless the authorization is terminated or revoked.  Performed at  [facility], [place] [address], [place], [place] [address]   Blood culture (routine single)     Status: None   Collection Time: 06/28/20  4:23 PM   Specimen: BLOOD  Result Value Ref Range Status   Specimen Description BLOOD SITE NOT SPECIFIED  Final   Special Requests   Final    BOTTLES DRAWN AEROBIC AND ANAEROBIC Blood Culture results may not be optimal due to an inadequate volume of blood received in culture bottles   Culture   Final    NO GROWTH 5 DAYS Performed at [facility], [place] [address], [place], [place] [address]    Report Status 07/03/2020 FINAL  Final  Urine culture     Status: None   Collection Time: 06/28/20  8:50 PM   Specimen: In/Out Cath Urine  Result Value Ref Range Status   Specimen Description IN/OUT CATH URINE  Final   Special Requests NONE  Final   Culture   Final    NO GROWTH Performed at [facility], [place] [address], [place], [place] [address]    Report Status 06/30/2020 FINAL  Final  MRSA PCR Screening     Status: None   Collection Time: 06/29/20  6:58 AM   Specimen: Nasal Mucosa; Nasopharyngeal  Result Value Ref Range Status   MRSA by PCR NEGATIVE NEGATIVE Final    Comment:        The GeneXpert MRSA Assay (FDA approved for NASAL specimens only), is one component of a comprehensive MRSA  colonization surveillance program. It is not intended to diagnose MRSA infection nor to guide or monitor treatment for MRSA infections. Performed at [facility], [place] [address], [place], [place] [address]     Today   Subjective    Anna [name] today has no headache,no chest abdominal pain,no new weakness tingling or numbness, feels much better      Objective   Blood pressure (!) 134/58, pulse 67, temperature 97.8 F (36.6 C), temperature source Oral, resp. rate 18, height 5\' 4"  (1.626 m), weight 73.8 kg, SpO2 93 %.   Intake/Output Summary (Last 24 hours) at 07/06/2020 0928 Last data filed at 07/06/2020 0731 Gross per 24 hour  Intake 600 ml  Output 1625 ml  Net -1025 ml    Exam  Awake Alert, No new F.N deficits, Normal affect [place].AT,PERRAL Supple Neck,No JVD, No cervical lymphadenopathy appriciated.  Symmetrical Chest wall movement, Good air movement bilaterally, CTAB RRR,No Gallops,Rubs or new Murmurs, No Parasternal Heave +ve B.Sounds, Abd Soft, Non tender, No organomegaly appriciated, No rebound -guarding or rigidity. No Cyanosis, Clubbing or edema, No new Rash or bruise   Data Review   CBC w Diff:  Lab Results  Component Value Date   WBC 9.4 07/06/2020   HGB 9.9 (L) 07/06/2020   HGB 11.9 (L) 05/20/2020   HCT 30.2 (L) 07/06/2020   PLT 595 (H) 07/06/2020   PLT 567 (H) 05/20/2020   LYMPHOPCT 21 07/03/2020   MONOPCT 14 07/03/2020   EOSPCT 1 07/03/2020   BASOPCT 1 07/03/2020    CMP:  Lab Results  Component Value Date   NA 135 07/06/2020   K 4.1 07/06/2020   CL 102 07/06/2020   CO2 25 07/06/2020   BUN 26 (H) 07/06/2020   CREATININE 1.50 (H) 07/06/2020   CREATININE 1.27 (H) 05/20/2020   PROT 6.2 (L) 07/06/2020   ALBUMIN 2.8 (L) 07/06/2020   BILITOT 0.4 07/06/2020   BILITOT 0.2 (L) 05/20/2020  ALKPHOS 78 07/06/2020   AST 15 07/06/2020   AST 15 05/20/2020   ALT 13 07/06/2020   ALT 19 05/20/2020  .   Total Time in preparing paper work,  data evaluation and todays exam - 67 minutes  Lala Lund M.D on 07/06/2020 at 9:28 AM  [facility]

## 2020-07-06 NOTE — Progress Notes (Addendum)
Progress Note  Patient Name: Anna [name] Date of Encounter: 07/06/2020  [facility] Cardiologist: Werner Lean, MD   Subjective   Patient states she is feeling well, denied any chest pain, SOB, dizziness. She is urinating well. She states she had some abdominal wall bleeding from heparin injection and had refused the shot this morning.   Inpatient Medications    Scheduled Meds: . aspirin EC  81 mg Oral Daily  . atorvastatin  40 mg Oral Daily  . fenofibrate  160 mg Oral Daily  . heparin  5,000 Units Subcutaneous Q8H  . insulin aspart  0-9 Units Subcutaneous TID WC  . metoprolol succinate  100 mg Oral Daily  . pregabalin  50 mg Oral BID  . rOPINIRole  0.5 mg Oral BID  . ticagrelor  90 mg Oral BID   Continuous Infusions: . sodium chloride    . sodium chloride 50 mL/hr at 07/06/20 0557   PRN Meds: sodium chloride, acetaminophen **OR** [DISCONTINUED] acetaminophen, melatonin, metoprolol tartrate, sodium chloride flush   Vital Signs    Vitals:   07/05/20 2028 07/06/20 0500 07/06/20 0731 07/06/20 0839  BP: (!) 147/67 (!) 161/80 (!) 154/76 (!) 134/58  Pulse: 84 86 68 67  Resp: 18 18 18    Temp: 97.8 F (36.6 C) 97.8 F (36.6 C) 97.8 F (36.6 C)   TempSrc: Oral Oral Oral   SpO2: 96% 95% 93%   Weight:  73.8 kg    Height:        Intake/Output Summary (Last 24 hours) at 07/06/2020 0930 Last data filed at 07/06/2020 0731 Gross per 24 hour  Intake 600 ml  Output 1625 ml  Net -1025 ml   Last 3 Weights 07/06/2020 07/04/2020 07/03/2020  Weight (lbs) 162 lb 9.6 oz 160 lb 8 oz 166 lb 7.2 oz  Weight (kg) 73.755 kg 72.802 kg 75.5 kg      Telemetry    Sinus rhythm with rate of 70s, occasional PVCs  - Personally Reviewed  ECG    N new tracings today - Personally Reviewed  Physical Exam   GEN: No acute distress.   Neck: No JVD Cardiac: RRR, no murmurs, rubs, or gallops.  Respiratory: Clear to auscultation bilaterally. GI: Soft, nontender, non-distended   MS: No edema; No deformity. Neuro:  Nonfocal  Psych: Normal affect   Labs    High Sensitivity Troponin:   Recent Labs  Lab 06/28/20 2315 06/29/20 0246 06/29/20 2305  TROPONINIHS 1,034* 932* 375*      Chemistry Recent Labs  Lab 07/03/20 0144 07/04/20 0604 07/05/20 0618 07/06/20 0651  NA 135 135 134* 135  K 3.1* 3.9 3.6 4.1  CL 100 98 100 102  CO2 25 24 25 25   GLUCOSE 182* 219* 184* 224*  BUN 23 33* 29* 26*  CREATININE 1.34* 1.74* 1.77* 1.50*  CALCIUM 9.1 9.6 9.6 9.5  PROT 6.7  --  6.7 6.2*  ALBUMIN 2.7*  --  2.8* 2.8*  AST 16  --  12* 15  ALT 16  --  13 13  ALKPHOS 77  --  76 78  BILITOT 0.7  --  0.3 0.4  GFRNONAA 42* 31* 30* 37*  ANIONGAP 10 13 9 8      Hematology Recent Labs  Lab 07/04/20 0604 07/05/20 0618 07/06/20 0651  WBC 11.5* 10.2 9.4  RBC 3.34* 3.30* 3.09*  HGB 10.5* 10.4* 9.9*  HCT 32.6* 32.2* 30.2*  MCV 97.6 97.6 97.7  MCH 31.4 31.5 32.0  MCHC  32.2 32.3 32.8  RDW 18.2* 17.8* 17.7*  PLT 549* 573* 595*    BNP Recent Labs  Lab 07/03/20 0144 07/05/20 0618 07/06/20 0651  BNP 1,476.3* 255.6* 286.4*     DDimer No results for input(s): DDIMER in the last 168 hours.   Radiology    No results found.  Cardiac Studies   Echo from 06/29/20:  1. Left ventricular ejection fraction, by estimation, is 35 to 40%. Left  ventricular ejection fraction by 3D volume is 37 %. The left ventricle has  moderately decreased function. The left ventricle demonstrates regional  wall motion abnormalities (see  scoring diagram/findings for description) in the mid and apical  distribution. This can be seen in stress mediated cardiomyopathy. No LV  thrombus visualized. There is mild concentric left ventricular  hypertrophy. Indeterminate diastolic filling due to E-A  fusion.  2. Right ventricular systolic function is normal. The right ventricular  size is normal.  3. A small pericardial effusion is present.  4. The mitral valve is grossly normal.  Trivial mitral valve  regurgitation.  5. The aortic valve is tricuspid. There is mild calcification of the  aortic valve. Aortic valve regurgitation is not visualized. Mild aortic  valve sclerosis is present, with no evidence of aortic valve stenosis.  6. The inferior vena cava is normal in size with greater than 50%  respiratory variability, suggesting right atrial pressure of 3 mmHg.   Left heart catheterization 07/01/20:  Ost LAD to Prox LAD lesion is 60% stenosed. Prox LAD to Mid LAD lesion is 70% stenosed with 50% stenosed side branch in 1st Sept.  Mid LAD lesion is 70% stenosed.  Prox Cx to Mid Cx lesion is 40% stenosed with 55% stenosed side branch in 1st Mrg.  2nd Mrg lesion is 80% stenosed.  LV end diastolic pressure is severely elevated.  Hemodynamic findings consistent with mild pulmonary hypertension.  Hemodynamic findings are consistent with severe cardiomyopathy  Hemodynamics consistent ACUTE COMBINED SYSTOLIC AND DIASTOLIC HEART FAILURE  SUMMARY 1. Severe two-vessel disease (codominant system); ? extensive LAD disease: Ostial (60%), proximal (segmental 70%) and focal mid 70% just after major 1st Diag branch ? Focal eccentric 80% 2nd Mrg   Mild Secondary Pulmonary Hypertension with mean PAP of 26 mmHg, PCWP and LVEDP of 26 and 27 mmHg. RVEDP and RAP 70mmHg  Moderate to severely reduced cardiac Output-Index: (Fick) 4.45-2.38; Thermodilution's 2.96-1.58.   Findings consistent with ACUTE COMBINED SYSTOLIC AND DIASTOLIC HEART FAILURE with likely ischemic contribution, but EF out of portion due to existing CAD.   RECOMMENDATIONS:  The patient be transferred to cardiac unit for post-cath care.  As per discussion with Dr. Gasper Sells -> we will diurese starting with 40 mg IV Lasix in the Cath Lab followed by 40 mg IV twice daily starting tomorrow.  Initiate GDMT for ICM and load with Brilinta over the weekend  Once stable from a volume and renal  function standpoint, would anticipate staged PCI of the LAD and OM 2 early next week.   Diagnostic Dominance: Co-dominant     Patient Profile   70 y.o.femalewith PMH of frequent UTI, HTN, DM type 2, CKD stage III, hyperlipidemia, peripheral neuropathy, chronic back pain, arthritis,essential thrombocytosis,  restless leg syndrome,who is currently admitted for sepsis due to UTI. Cardiology isfollowing for elevated troponin, dyspnea, and volume overload. Cath revealing severe 2 vessel CAD, plan for staged PCI, now cancelled for AKI and will pursue for outpatient PCI.   Assessment & Plan    NSTEMI, new  diagnosed CAD: patient presented with sepsis with episodes of acute hypoxic respiratory failure this admission following IVF resuscitation. HsTrop peaked at 1034 and trended down. BNP elevated to 2500. EKG showed sinus tachycardia with lateral TWI. Echo showed EF 35-40% with mid-apical WMA c/f stress mediated cardiomyopathy. LHC 4/14 showed severe 2-vessel CAD extensive LAD disease: Ostial (60%), proximal (segmental 70%) and focal mid 70% just after major 1st Diag branch; Focal eccentric 80% 2nd Mrg. Mild Secondary Pulmonary Hypertension with mean PAP of 26 mmHg, PCWP and LVEDP of 26 and 27 mmHg. RVEDP and RAP 38mmHg. Moderate to severely reduced cardiac Output-Index: (Fick) 4.45-2.38; Thermodilution's 2.96-1.58. - Initially planned for staged PCI of the LAD and OM 2 this admission , but due to AKI, this is held, will plan for outpatient PCI upon recovery of renal function  - Continue medical therapy with aspirin, Brilinta, statin, fenofibrate, metoprolol    Acute combined [id number] with episodes of hypoxemic respiratory failure this admission, likely 2/2 IVF resuscitation in the setting of sepsis. Found to have newly reduced EF 35-40% with RWMA on echo this admission c/f stress mediated cardiomyopathy. She was diuresed with  IV lasix, diuretic on hold due to AKI, UOP net -1475 ml in the  past 24 hours. Weight if accurate is quite labile past 24 hours, 157lbs>179lbs>162lbs.  - lasix held now due to rising Cr to 1.77 yesterday, fluid restriction lifted with gentle IVF hydration given, Cr improving to 1.5 today - Continue metoprolol XL 100mg  daily  - will hold off adding Entresto given AKI pending recovery  - Continue to monitor strict I&Os and daily weights - Continue to monitor electrolytes and replete as needed to maintain K>4, Mg>2 - will need repeat BMP in 1-2 weeks with PCP follow up for re-evaluation of AKI  AKI on CKD stage IIIa, nonoliguric  - Cr up to 1.77 on 4/18, possible due to recent contrast use and diuresis - Lasix held currently, fluid restriction lifted, repeat BMP with Cr down to 1.5 today - holding off PCI this admission, plan for outpatient PCI upon complete recovery of AKI - avoid nephrotoxin  - will need repeat BMP in 1-2 weeks with PCP follow up for re-evaluation of AKI  Sepsis with end organ dysfunction 2/2 complicated UTI - She completed full course  IV cefepime   HTN:BP fairly controlled  - continue metoprolol, may add Entresto at outpatient when AKI resolves   HLD:LDL 59, triglyceride 168 this admission - Continue atorvastatin - added fenofibrate 160mg  since cath revealing CAD this admission   DM type 2:A1C 7.5 this admission; goal <7. With frequent UTI's, addition of SGLT2-inhibitor may be contraindicated - Continue management per primary team  Essential thrombocytocytosis:follows with Dr. Irene Limbo outpatient for thrombocytosis on hydroxyurea. PLT rising,  hydroxyurea on hold per primary team this admission - advised patient to discuss with Dr Irene Limbo going forward on treatment plan   Tobacco abuse:reports smoking 3 cigarettes per day since age 74.  - Continue to encourage cessation    Follow up appointment arranged with cardiology on 08/17/20 at 9:20AM  (first available appointment with Dr Gasper Sells, no prior appointment with APP  available )   For questions or updates, please contact [facility] Please consult [email or web address] for contact info under        Signed, Margie Billet, NP  07/06/2020, 9:30 AM    History and all data above reviewed.  Patient examined.  I agree with the findings as above.  She denies any chest pain or SOB.  Ready for discharge.  The patient exam reveals COR:RRR  ,  Lungs: Mild decreased breath sounds that improve with inspiration.   ,  Abd: Positive bowel sounds, no rebound no guarding, Ext no edema  .  All available labs, radiology testing, previous records reviewed. Agree with documented assessment and plan.   CAD:  Reviewed with my interventional partners and the plan is to defer PCI until her kidney function is at baseline as she has no acute symptoms.  Discussed at length with the patient and her husband and they agree.  We have follow up arranged and I have a message to Dr. Gasper Sells to move up the follow up with him at which time they will make arrangements for elective PCI.   I would suggest that creat be checked next week and she will likely need to have Entresto restarted.  BP is up (EF is down).  She needs to be on beta blocker at discharge and I will start Coreg 3.125 mg bid.       Jeneen Rinks Kyliah Deanda  11:21 AM  07/06/2020

## 2020-07-06 NOTE — Discharge Instructions (Signed)
Follow with Primary MD Leanna Battles, MD in 7 days   Get CBC, CMP, 2 view Chest X ray -  checked next visit within 1 week by Primary MD    Activity: As tolerated with Full fall precautions use walker/cane & assistance as needed  Disposition Home    Diet: Heart Healthy  Low Carb, 1.5 lit/day fluid restriction.   Accuchecks 4 times/day, Once in AM empty stomach and then before each meal. Log in all results and show them to your Prim.MD in 3 days. If any glucose reading is under 80 or above 300 call your Prim MD immidiately. Follow Low glucose instructions for glucose under 80 as instructed.   Special Instructions: If you have smoked or chewed Tobacco  in the last 2 yrs please stop smoking, stop any regular Alcohol  and or any Recreational drug use.  On your next visit with your primary care physician please Get Medicines reviewed and adjusted.  Please request your Prim.MD to go over all Hospital Tests and Procedure/Radiological results at the follow up, please get all Hospital records sent to your Prim MD by signing hospital release before you go home.  If you experience worsening of your admission symptoms, develop shortness of breath, life threatening emergency, suicidal or homicidal thoughts you must seek medical attention immediately by calling 911 or calling your MD immediately  if symptoms less severe.  You Must read complete instructions/literature along with all the possible adverse reactions/side effects for all the Medicines you take and that have been prescribed to you. Take any new Medicines after you have completely understood and accpet all the possible adverse reactions/side effects.

## 2020-07-06 NOTE — Progress Notes (Signed)
Pt refusing SQ Heparin at this time due to multiple bruises and oozing of blood from injection sites on abdomen. AM dose will be held due to planned PCI in am, so will pass on to discuss with MD in am. Jessie Foot, RN

## 2020-07-08 ENCOUNTER — Other Ambulatory Visit (HOSPITAL_BASED_OUTPATIENT_CLINIC_OR_DEPARTMENT_OTHER): Payer: Self-pay

## 2020-07-08 ENCOUNTER — Encounter (HOSPITAL_COMMUNITY): Payer: Medicare Other

## 2020-07-08 ENCOUNTER — Other Ambulatory Visit (HOSPITAL_COMMUNITY): Payer: Self-pay

## 2020-07-13 DIAGNOSIS — N390 Urinary tract infection, site not specified: Secondary | ICD-10-CM | POA: Diagnosis not present

## 2020-07-13 DIAGNOSIS — I2510 Atherosclerotic heart disease of native coronary artery without angina pectoris: Secondary | ICD-10-CM | POA: Diagnosis not present

## 2020-07-13 DIAGNOSIS — E1151 Type 2 diabetes mellitus with diabetic peripheral angiopathy without gangrene: Secondary | ICD-10-CM | POA: Diagnosis not present

## 2020-07-13 DIAGNOSIS — A419 Sepsis, unspecified organism: Secondary | ICD-10-CM | POA: Diagnosis not present

## 2020-07-14 ENCOUNTER — Encounter (HOSPITAL_COMMUNITY): Payer: Medicare Other

## 2020-07-14 ENCOUNTER — Telehealth (HOSPITAL_COMMUNITY): Payer: Self-pay | Admitting: Pharmacist

## 2020-07-14 ENCOUNTER — Other Ambulatory Visit (HOSPITAL_COMMUNITY): Payer: Self-pay

## 2020-07-14 NOTE — Telephone Encounter (Signed)
Pharmacy Transitions of Care Follow-up Telephone Call  Date of discharge:  07/08/2020  How have you been since you were released from the hospital? Good  Medication changes made at discharge:  - START: fenofibrate, metoprolol, ropinirole, Brilinta  - STOPPED: Azor, Celebrex, gabapentin, metformin   Medication changes verified by the patient? Yes   Medication Accessibility:  Was the patient provided with refills on discharged medications? No    Medication Review:  TICAGRELOR (BRILINTA) Ticagrelor 90 mg BID initiated on 07/07/20 - Educated patient on expected duration of therapy of aspirin with ticagrelor. Aspirin will be continued indefinitely/discontinued. - Discussed importance of taking medication around the same time every day, - Reviewed potential DDIs with patient - Advised patient of medications to avoid (NSAIDs, aspirin maintenance doses>100 mg daily) - Educated that Tylenol (acetaminophen) will be the preferred analgesic to prevent risk of bleeding  - Emphasized importance of monitoring for signs and symptoms of bleeding (abnormal bruising, prolonged bleeding, nose bleeds, bleeding from gums, discolored urine, black tarry stools)  - Educated patient to notify doctor if shortness of breath or abnormal heartbeat occur - Advised patient to alert all providers of antiplatelet therapy prior to starting a new medication or having a procedure    Follow-up Appointments:  PCP Hospital f/u appt confirmed? Friday at 11   If their condition worsens, is the pt aware to call PCP or go to the Emergency Dept.? Yes  Final Patient Assessment: Patient reports she is feeling well.  Reviewed current medications and possible side effects to be aware about.

## 2020-07-14 NOTE — Progress Notes (Addendum)
[facility] Trans[facility]  PCP: Leanna Battles Primary Cardiologist: Evon Slack  HPI:  Anna [name] is a 70 y.o.  female  with a PMH significant for Combined systolic and Diastolic CHF, recurrent urinary tract infections, hypertension, type 2 diabetes mellitus, CKD stage IIIa,    Admitted to [facility] on 06/28/2020 with severe sepsis due to urinary tract infection after presenting from home to [facility] ED complaining of fever. Started on early empiric antibiotic therapy and given aggressive IVF resuscitation. 06/29/20 she became increasingly SOB requiring NRB support, denied any chest pain or pressure, dizziness, syncope, heart palpitation. No previous similar symptoms at home. No prior cardiac diagnosis in the past.  Hs strop was checked 1034 > 932 > 375. BNP elevated to 2548.  EKG with sinus tachycardia 130s with associated TWI of lateral leads.  She had CTA of the chest which was negative for PE but positive for coronary artery calcifications.  Echo from 06/29/20 showed EF 35-40%, moderate LV depressed function, RWM abnormalities in mid and apical distribution, indeterminate diastolic function.  Given these findings the decision was made to proceed with L/RHC:   Severe two-vessel disease (codominant system); ? extensive LAD disease: Ostial (60%), proximal (segmental 70%)  and focal mid 70% just after major 1st Diag branch ? Focal eccentric 80% 2nd Mrg   Mild Secondary Pulmonary Hypertension with mean PAP of 26 mmHg, PCWP and LVEDP of 26 and 27 mmHg.  RVEDP and RAP 68mmHg  Moderate to severely reduced cardiac Output-Index: (Fick) 4.45-2.38; Thermodilution's 2.96-1.58.   Findings consistent with ACUTE COMBINED SYSTOLIC AND DIASTOLIC HEART FAILURE with likely ischemic contribution, but EF out of proportion to existing CAD.   Patient was continued on IV lasix given findings, started on metoprolol succinate 100mg . plan was for PCI on 4/18 however  renal function continued to worsen and was thought to be secondary to contrast use and diuresis.  Lasix was held, Cr peaked from  1.2> 1.7 and was 1.5 on discharge.  Plans in place to perform PCI in the outpatient setting.  Admission weight 155 and discharge weight 162lbs.     Tells me she feels good currently.  Her breathing has been doing well.  Ambulation has been limited due to back pain, her main concern today is getting cardiac clearance for back surgery.  Denies any chest pains.  Sleeping poorly due to back pain but not waking up short of breath.      ROS: All systems negative except as listed in HPI, PMH and Problem List.  SH:  Social History   Socioeconomic History  . Marital status: Married    Spouse name: [name]  . Number of children: 2  . Years of education: 12 +  . Highest education level: Associate degree: occupational, Hotel manager, or vocational program  Occupational History  . Occupation: retired  Tobacco Use  . Smoking status: Former Smoker    Packs/day: 0.15    Years: 56.00    Pack years: 8.40    Types: Cigarettes    Quit date: 06/18/2020    Years since quitting: 0.0  . Smokeless tobacco: Never Used  . Tobacco comment: smokes 3/day.   Vaping Use  . Vaping Use: Never used  Substance and Sexual Activity  . Alcohol use: Never  . Drug use: No  . Sexual activity: Not on file  Other Topics Concern  . Not on file  Social History Narrative   ** Merged History Encounter **  Lives with husband No caffeine   Social Determinants of Health   Financial Resource Strain: Low Risk   . Difficulty of Paying Living Expenses: Not very hard  Food Insecurity: No Food Insecurity  . Worried About Charity fundraiser in the Last Year: Never true  . Ran Out of Food in the Last Year: Never true  Transportation Needs: No Transportation Needs  . Lack of Transportation (Medical): No  . Lack of Transportation (Non-Medical): No  Physical Activity: Not on file  Stress:  Not on file  Social Connections: Not on file  Intimate Partner Violence: Not on file    FH:  Family History  Problem Relation Age of Onset  . Heart disease Father   . Heart disease Mother   . Cancer Mother        leukemia  . Diabetes Paternal Aunt   . Cancer Sister   . Heart attack Maternal Grandfather     Past Medical History:  Diagnosis Date  . Arthritis   . Chronic kidney disease    kidney stone  . Diabetes mellitus without complication (HCC)    on Metformin  . Fibromyalgia   . History of kidney stones   . Hypertension   . Insomnia   . 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 Pain in limb   . Psoriasis   . RLS (restless legs syndrome)   . Varicose veins     Current Outpatient Medications  Medication Sig Dispense Refill  . acetaminophen (TYLENOL) 325 MG tablet Take 2 tablets (650 mg total) by mouth every 6 (six) hours as needed for mild pain, fever or headache.    Marland Kitchen ascorbic acid (VITAMIN C) 500 MG tablet Take 500 mg by mouth daily.    Marland Kitchen aspirin EC 81 MG tablet Take 81 mg by mouth daily.    Marland Kitchen atorvastatin (LIPITOR) 40 MG tablet Take 40 mg by mouth daily.     . calcium carbonate (OSCAL) 1500 (600 Ca) MG TABS tablet Take 600 mg by mouth daily.    . fenofibrate 160 MG tablet Take 1 tablet (160 mg total) by mouth daily. 30 tablet 0  . Glucosamine HCl (GLUCOSAMINE PO) Take 1,000 mg by mouth daily.     . hydroxyurea (HYDREA) 500 MG capsule Take 1 capsule (500 mg total) by mouth daily. May take with food to minimize GI side effects. 30 capsule 2  . Magnesium 200 MG TABS Take 200 mg by mouth daily.    . metoprolol succinate (TOPROL-XL) 100 MG 24 hr tablet Take 1 tablet (100 mg total) by mouth daily. Take with or immediately following a meal. 30 tablet 0  . pioglitazone (ACTOS) 15 MG tablet Take 15 mg by mouth daily.    . pregabalin (LYRICA) 50 MG capsule Take 50 mg by mouth in the morning and at bedtime.    Marland Kitchen rOPINIRole (REQUIP) 0.5 MG tablet Take 1 tablet (0.5 mg  total) by mouth 2 (two) times daily. 60 tablet 0  . saccharomyces boulardii (FLORASTOR) 250 MG capsule Take 250 mg by mouth daily.    . ticagrelor (BRILINTA) 90 MG TABS tablet Take 1 tablet (90 mg total) by mouth 2 (two) times daily. 60 tablet 0   No current facility-administered medications for this encounter.    Vitals:   07/16/20 1051  BP: 134/80  Pulse: (!) 55  SpO2: 98%  Weight: 72.5 kg (159 lb 12.8 oz)  Height: 5\' 4"  (1.626 m)  PHYSICAL EXAM: Cardiac: JVD flat, normal rate and rhythm, clear s1 and s2, no murmurs, rubs or gallops, no LE edema Pulmonary: CTAB, not in distress Abdominal: non distended abdomen, soft and nontender Psych: Alert, conversant, in good spirits  ECG   ASSESSMENT & PLAN:  Chronic Combined Systolic and Diastolic CHF: -Recent diagnosis in the setting of sepsis and CAD, CHF felt to be out of proportion to amount of CAD -RHC with Mild Secondary Pulmonary Hypertension with mean PAP of 26 mmHg, PCWP and LVEDP of 26 and 27 mmHg. RVEDP and RAP 83mmHg.Moderate to severely reduced cardiac Output-Index:(Fick) 4.45-2.38; Thermodilution's 2.96-1.58. -ECHO 06/29/20 showed EF 35-40%, moderate LV depressed function, RWM abnormalities in mid and apical distribution -NYHA class II, euvolemic on exam -continue toprol XL 100mg  -start entresto 24/26mg  -Patient to discuss with PCP changing pioglitazone to a different agent   CAD:  -LHC4/14showed severe 2-vessel CADextensive LAD disease: Ostial (60%), proximal (segmental 70%) and focal mid 70% just after major 1st Diag branch;Focal eccentric 80% 2nd Mrg. -Initially planned forstaged PCI of the LAD and OM 2, but due to AKI she did not receive this while inpatient, plan is for outpatient PCI -Continue medical therapy with aspirin,Brilinta, statin, fenofibrate, metoprolol   CKD IIIb  -baseline creatinine 1.1-1.4 improved from 1.7 to 1.5 before discharge -repeat bmp today, recommend repeat at FU appointment  on Tuesday as well -add entresto for renal protection  Sepsis with end organ dysfunction 2/2 complicated UTI - She completed full course  IV cefepime   HTN: - continue metoprolol,    Tobacco use disorder: - Has not smoked in 3 weeks.  Continue to encourage cessation  DM type 2: -recent A1C 7.5  -no sglt2i with urosepsis -patient will discuss with PCP possibly switching pioglitazone to a different agent, she has mild hepatic steatosis on imaging I think the cardiac risk outweighs the benefit, given her coronary disease could consider a GLP-1RA which also has some evidence for hepatic steatosis and weight loss as her BMI is 27.43  Essential thrombocytosis: -Increased somewhat in the setting of sepsis and anemia, last platelet count 595k -follows with Dr. Irene Limbo on hydroxyurea -already on asa/brillinta for CAD    Follow up with general cardiology on Tuesday

## 2020-07-15 ENCOUNTER — Other Ambulatory Visit (HOSPITAL_COMMUNITY): Payer: Self-pay

## 2020-07-15 ENCOUNTER — Telehealth: Payer: Self-pay | Admitting: Hematology

## 2020-07-15 NOTE — Telephone Encounter (Signed)
Scheduled appt per 4/27 sch msg. Called pt, who asked for me to call her back at a later time to discuss appts. Will call pt back at a later date.

## 2020-07-16 ENCOUNTER — Other Ambulatory Visit: Payer: Self-pay

## 2020-07-16 ENCOUNTER — Encounter (HOSPITAL_COMMUNITY): Payer: Self-pay

## 2020-07-16 ENCOUNTER — Ambulatory Visit (HOSPITAL_COMMUNITY)
Admission: RE | Admit: 2020-07-16 | Discharge: 2020-07-16 | Disposition: A | Discharge status: Home / Self Care | Payer: Medicare Other | Source: Ambulatory Visit | Attending: Internal Medicine | Admitting: Internal Medicine

## 2020-07-16 ENCOUNTER — Telehealth (HOSPITAL_COMMUNITY): Payer: Self-pay

## 2020-07-16 ENCOUNTER — Other Ambulatory Visit (HOSPITAL_COMMUNITY): Payer: Self-pay

## 2020-07-16 VITALS — BP 134/80 | HR 55 | Ht 64.0 in | Wt 159.8 lb

## 2020-07-16 DIAGNOSIS — K760 Fatty (change of) liver, not elsewhere classified: Secondary | ICD-10-CM | POA: Diagnosis not present

## 2020-07-16 DIAGNOSIS — I10 Essential (primary) hypertension: Secondary | ICD-10-CM | POA: Diagnosis not present

## 2020-07-16 DIAGNOSIS — Z7982 Long term (current) use of aspirin: Secondary | ICD-10-CM | POA: Diagnosis not present

## 2020-07-16 DIAGNOSIS — Z87440 Personal history of urinary (tract) infections: Secondary | ICD-10-CM | POA: Diagnosis not present

## 2020-07-16 DIAGNOSIS — D473 Essential (hemorrhagic) thrombocythemia: Secondary | ICD-10-CM | POA: Diagnosis not present

## 2020-07-16 DIAGNOSIS — E118 Type 2 diabetes mellitus with unspecified complications: Secondary | ICD-10-CM

## 2020-07-16 DIAGNOSIS — I2729 Other secondary pulmonary hypertension: Secondary | ICD-10-CM | POA: Insufficient documentation

## 2020-07-16 DIAGNOSIS — Z79899 Other long term (current) drug therapy: Secondary | ICD-10-CM | POA: Insufficient documentation

## 2020-07-16 DIAGNOSIS — I2510 Atherosclerotic heart disease of native coronary artery without angina pectoris: Secondary | ICD-10-CM | POA: Insufficient documentation

## 2020-07-16 DIAGNOSIS — E1122 Type 2 diabetes mellitus with diabetic chronic kidney disease: Secondary | ICD-10-CM | POA: Diagnosis not present

## 2020-07-16 DIAGNOSIS — M549 Dorsalgia, unspecified: Secondary | ICD-10-CM | POA: Diagnosis present

## 2020-07-16 DIAGNOSIS — F17200 Nicotine dependence, unspecified, uncomplicated: Secondary | ICD-10-CM

## 2020-07-16 DIAGNOSIS — I5042 Chronic combined systolic (congestive) and diastolic (congestive) heart failure: Secondary | ICD-10-CM | POA: Insufficient documentation

## 2020-07-16 DIAGNOSIS — G8929 Other chronic pain: Secondary | ICD-10-CM | POA: Diagnosis present

## 2020-07-16 DIAGNOSIS — Z87891 Personal history of nicotine dependence: Secondary | ICD-10-CM | POA: Insufficient documentation

## 2020-07-16 DIAGNOSIS — Z7984 Long term (current) use of oral hypoglycemic drugs: Secondary | ICD-10-CM | POA: Diagnosis not present

## 2020-07-16 DIAGNOSIS — I130 Hypertensive heart and chronic kidney disease with heart failure and stage 1 through stage 4 chronic kidney disease, or unspecified chronic kidney disease: Secondary | ICD-10-CM | POA: Insufficient documentation

## 2020-07-16 DIAGNOSIS — N1832 Chronic kidney disease, stage 3b: Secondary | ICD-10-CM | POA: Diagnosis not present

## 2020-07-16 LAB — BASIC METABOLIC PANEL
Anion gap: 7 (ref 5–15)
BUN: 30 mg/dL — ABNORMAL HIGH (ref 8–23)
CO2: 26 mmol/L (ref 22–32)
Calcium: 10 mg/dL (ref 8.9–10.3)
Chloride: 102 mmol/L (ref 98–111)
Creatinine, Ser: 1.56 mg/dL — ABNORMAL HIGH (ref 0.44–1.00)
GFR, Estimated: 35 mL/min — ABNORMAL LOW (ref >60)
Glucose, Bld: 225 mg/dL — ABNORMAL HIGH (ref 70–99)
Potassium: 4.8 mmol/L (ref 3.5–5.1)
Sodium: 135 mmol/L (ref 135–145)

## 2020-07-16 MED ORDER — SACUBITRIL-VALSARTAN 24-26 MG PO TABS: 1.0000 | ORAL_TABLET | Freq: Two times a day (BID) | ORAL | 5 refills | Status: DC

## 2020-07-16 NOTE — Patient Instructions (Signed)
START Entresto 24/26mg  (1 tab) twice a day  Labs today We will only contact you if something comes back abnormal or we need to make some changes. Otherwise no news is good news!  Follow up with General Cardiology  At the [facility], you and your health needs are our priority. As part of our continuing mission to provide you with exceptional heart care, we have created designated Provider Care Teams. These Care Teams include your primary Cardiologist (physician) and Advanced Practice Providers (APPs- Physician Assistants and Nurse Practitioners) who all work together to provide you with the care you need, when you need it.   You may see any of the following providers on your designated Care Team at your next follow up: Marland Kitchen Dr Glori Bickers . Dr Loralie Champagne . Dr Vickki Muff . Darrick Grinder, NP . Lyda Jester, [place] . Audry Riles, PharmD   Please be sure to bring in all your medications bottles to every appointment.

## 2020-07-16 NOTE — Telephone Encounter (Signed)
Heart Failure Nurse Navigator Progress Note  Called to confirm Heart & Vascular Transitions of Care appointment at [place]. Patient reminded to bring all medications and pill box organizer with them. Confirmed patient has transportation. Gave directions, instructed to utilize [address] parking.  Confirmed appointment prior to ending call.   Pricilla Holm, RN, BSN Heart Failure Nurse Navigator [phone]

## 2020-07-16 NOTE — Progress Notes (Signed)
[facility] Center Transitions of Care Clinic Heart Failure Pharmacist Encounter  PCP: Leanna Battles, MD PCP-Cardiologist: Werner Lean, MD  HPI:  70 yo F with PMH of recurrent urinary tract infections, restless leg syndrome, hypertension, type 2 diabetes mellitus, and CKD III. She was hospitalized from 06/28/20-07/06/20 for severe sepsis due to UTI and subsequently found to have CHF during the admission. An ECHO was done on 06/30/20 with LVEF of 35-40%. R/LHC done on 07/01/20 showed extensive severe 2 vessel CAD and pulmonary hypertension (PA 26, wedge 26, CO 4.5, CI 1.6). Pending staged PCI of LAD and OM as outpatient as her AKI was limiting additional contrast during hospitalization.   Today, Anna [name] presents to the [facility] for follow up. She denies having shortness of breath, DOE, orthopnea/PND, edema, lightheadedness, or dizziness. Her BP at home averages 130-140/80s and weights remain stable from 157-159 lbs. She has been following a low sodium and fluid restricted diet at home. She takes all medications as prescribed and uses two separate pill boxes for AM / PM dosing.  HF Medications: Metoprolol XL 100 mg daily  Has the patient been experiencing any side effects to the medications prescribed?  no  Does the patient have any problems obtaining medications due to transportation or finances?   no  Understanding of regimen: good Understanding of indications: good Potential of compliance: good Patient understands to avoid NSAIDs. Patient understands to avoid decongestants.   Pertinent Lab Values: . Serum creatinine 1.56, BUN 30, Potassium 4.8, Sodium 135   Vital Signs: . Weight: 159 lbs (discharge weight: 162 lbs) . Blood pressure: 134/80 mmHg  . Heart rate: 55 bpm   Medication Assistance / Insurance Benefits Check: Does the patient have prescription insurance?  Yes Type of insurance plan: Medicare  Outpatient Pharmacy:  Current  outpatient pharmacy: [facility] Was the [facility] pharmacy used to supply discharge medications? no  Is the patient willing to transition their outpatient pharmacy to utilize a [facility] outpatient pharmacy with or without mail order?   No  Assessment: 1) Chronic systolic CHF (EF 91-63%), due to ICM. NYHA class II symptoms. - Continue metoprolol XL 100 mg daily - Start Entresto 24/26 mg BID - Consider starting spironolactone at follow up if SCr and potassium allows - No SGLT2i with history of urosepsis and recurrent UTI - Consider stopping pioglitazone with HFrEF  Plan: 1) Medication changes: - Start Entresto 24/26 mg BID  2) Patient Assistance: - Provided patient with new BID pill box - Provided patient with free 30-day Entresto card Delene Loll copay $37 per month  3) Follow up: - Next appointment with [facility] on 5/3 - recommend BMET during this visit  Kerby Nora, PharmD, BCPS Heart Failure Transitions of Care Clinic Pharmacist [phone]

## 2020-07-19 ENCOUNTER — Telehealth: Payer: Self-pay | Admitting: Hematology
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
## 2020-07-19 NOTE — Telephone Encounter (Signed)
Scheduled appt per 4/27 sch msg. Pt aware.  

## 2020-07-20 ENCOUNTER — Telehealth: Payer: Self-pay | Admitting: Internal Medicine

## 2020-07-20 ENCOUNTER — Other Ambulatory Visit: Payer: Self-pay

## 2020-07-20 ENCOUNTER — Encounter: Payer: Self-pay | Admitting: Internal Medicine

## 2020-07-20 ENCOUNTER — Ambulatory Visit: Payer: Medicare Other | Admitting: Internal Medicine

## 2020-07-20 VITALS — BP 120/70 | HR 63 | Ht 64.0 in | Wt 158.0 lb

## 2020-07-20 DIAGNOSIS — I739 Peripheral vascular disease, unspecified: Secondary | ICD-10-CM

## 2020-07-20 DIAGNOSIS — I25118 Atherosclerotic heart disease of native coronary artery with other forms of angina pectoris: Secondary | ICD-10-CM | POA: Diagnosis not present

## 2020-07-20 DIAGNOSIS — I10 Essential (primary) hypertension: Secondary | ICD-10-CM | POA: Diagnosis not present

## 2020-07-20 DIAGNOSIS — I5042 Chronic combined systolic (congestive) and diastolic (congestive) heart failure: Secondary | ICD-10-CM

## 2020-07-20 LAB — BASIC METABOLIC PANEL
BUN/Creatinine Ratio: 16 (ref 12–28)
BUN: 35 mg/dL — ABNORMAL HIGH (ref 8–27)
CO2: 21 mmol/L (ref 20–29)
Calcium: 10.7 mg/dL — ABNORMAL HIGH (ref 8.7–10.3)
Chloride: 97 mmol/L (ref 96–106)
Creatinine, Ser: 2.2 mg/dL — ABNORMAL HIGH (ref 0.57–1.00)
Glucose: 374 mg/dL — ABNORMAL HIGH (ref 65–99)
Potassium: 5 mmol/L (ref 3.5–5.2)
Sodium: 135 mmol/L (ref 134–144)
eGFR: 23 mL/min/{1.73_m2} — ABNORMAL LOW (ref >59)

## 2020-07-20 LAB — CBC
Hematocrit: 37.7 % (ref 34.0–46.6)
Hemoglobin: 12.5 g/dL (ref 11.1–15.9)
MCH: 31.6 pg (ref 26.6–33.0)
MCHC: 33.2 g/dL (ref 31.5–35.7)
MCV: 95 fL (ref 79–97)
Platelets: 536 10*3/uL — ABNORMAL HIGH (ref 150–450)
RBC: 3.96 x10E6/uL (ref 3.77–5.28)
RDW: 15.4 % (ref 11.7–15.4)
WBC: 6.7 10*3/uL (ref 3.4–10.8)

## 2020-07-20 NOTE — Telephone Encounter (Signed)
Called husband.  Verified Correct patient.  Discussed increase in creatinine.  Will hold entresto and recheck labs on Friday.  Husband has no further questions.

## 2020-07-20 NOTE — Patient Instructions (Addendum)
Medication Instructions:  Your physician recommends that you continue on your current medications as directed. Please refer to the Current Medication list given to you today.  *If you need a refill on your cardiac medications before your next appointment, please call your pharmacy*   Lab Work: TODAY: CBC and BMET If you have labs (blood work) drawn today and your tests are completely normal, you will receive your results only by: Marland Kitchen MyChart Message (if you have MyChart) OR . A paper copy in the mail If you have any lab test that is abnormal or we need to change your treatment, we will call you to review the results.   Testing/Procedures:  Your physician has requested that you have an ankle brachial index (ABI) and lower extremity duplex study. During this test an ultrasound and blood pressure cuff are used to evaluate the arteries that supply the arms and legs with blood. Allow thirty minutes for this exam. There are no restrictions or special instructions.  Your physician has requested that you have a cardiac catheterization on Tues Jul 27, 2020. Cardiac catheterization is used to diagnose and/or treat various heart conditions. Doctors may recommend this procedure for a number of different reasons. The most common reason is to evaluate chest pain. Chest pain can be a symptom of coronary artery disease (CAD), and cardiac catheterization can show whether plaque is narrowing or blocking your heart's arteries. This procedure is also used to evaluate the valves, as well as measure the blood flow and oxygen levels in different parts of your heart. Please follow instruction sheet, as given.  Due to recent COVID-19 restrictions implemented by our local and state authorities and in an effort to keep both patients and staff as safe as possible, our hospital system requires COVID-19 testing prior to certain scheduled hospital procedures.  Please go to [place]. [place], [place] [address] on MONDAY  Jul 26, 2020 at 12:50 PM  .  This is a drive up testing site.  You will not need to exit your vehicle.  You will not be billed at the time of testing but may receive a bill later depending on your insurance. You must agree to self-quarantine from the time of your testing until the procedure date on Jul 27, 2020.  This should included staying home with ONLY the people you live with.  Avoid take-out, grocery store shopping or leaving the house for any non-emergent reason.  Failure to have your COVID-19 test done on the date and time you have been scheduled will result in cancellation of your procedure.  Please call our office at [phone] if you have any questions.    Follow-Up: At [facility], you and your health needs are our priority.  As part of our continuing mission to provide you with exceptional heart care, we have created designated Provider Care Teams.  These Care Teams include your primary Cardiologist (physician) and Advanced Practice Providers (APPs -  Physician Assistants and Nurse Practitioners) who all work together to provide you with the care you need, when you need it.  We recommend signing up for the patient portal called "MyChart".  Sign up information is provided on this After Visit Summary.  MyChart is used to connect with patients for Virtual Visits (Telemedicine).  Patients are able to view lab/test results, encounter notes, upcoming appointments, etc.  Non-urgent messages can be sent to your provider as well.   To learn more about what you can do with MyChart, go to [email or web address].  Your next appointment:   1 month(s)  The format for your next appointment:   In Person  Provider:   You may see Werner Lean, MD or one of the following Advanced Practice Providers on your designated Care Team:    Melina Copa, PA-C  Ermalinda Barrios, PA-C    Other Instructions     [facility] OFFICE [place], [place] [address] Dept: [phone] Loc: [place]  07/20/2020  You are scheduled for a Cardiac Catheterization on Tuesday, May 10 with Dr. Larae Grooms.  1. Please arrive at the [facility] (Main Entrance A) at [facility]: [address] [place], [place] [address] at 5:30 AM (This time is two hours before your procedure to ensure your preparation). Free valet parking service is available.   Special note: Every effort is made to have your procedure done on time. Please understand that emergencies sometimes delay scheduled procedures.  2. Diet: Do not eat solid foods after midnight.  The patient may have clear liquids until 5am upon the day of the procedure.  3. Labs: will be drawn today   4. Medication instructions in preparation for your procedure:   Contrast Allergy: No   Take Brilinta the morning of procedure  Do not take Metformin on the day of heart cath and 48 hours after procedure   On the morning of your procedure, take your Aspirin 81 mg  and any morning medicines NOT listed above.  You may use sips of water.  5. Plan for one night stay--bring personal belongings. 6. Bring a current list of your medications and current insurance cards. 7. You MUST have a responsible person to drive you home. 8. Someone MUST be with you the first 24 hours after you arrive home or your discharge will be delayed. 9. Please wear clothes that are easy to get on and off and wear slip-on shoes.  Thank you for allowing [place] to care for you!   -- [place] Invasive Cardiovascular services

## 2020-07-21 NOTE — Addendum Note (Signed)
Addended by: Antonieta Iba on: 07/21/2020 03:07 PM   Modules accepted: Orders

## 2020-07-21 NOTE — Telephone Encounter (Signed)
Spoke with the patient's husband. They are unable to come in on Friday for repeat lab work. They will come in on Monday 5/9.

## 2020-07-21 NOTE — Addendum Note (Signed)
Addended by: Antonieta Iba on: 07/21/2020 03:09 PM   Modules accepted: Orders

## 2020-07-22 ENCOUNTER — Telehealth: Payer: Self-pay | Admitting: *Deleted

## 2020-07-22 DIAGNOSIS — I25118 Atherosclerotic heart disease of native coronary artery with other forms of angina pectoris: Secondary | ICD-10-CM

## 2020-07-22 DIAGNOSIS — N1832 Chronic kidney disease, stage 3b: Secondary | ICD-10-CM

## 2020-07-22 NOTE — Telephone Encounter (Addendum)
Pt is scheduled for BMP/pre-procedure COVID test Monday Jul 26, 2020. Since cath is Tuesday, call placed to  patient to ask her to go to [facility] Monday Jul 26, 2020 8:00 AM for STAT BMP  and then she will plan to go for pre-procedure COVID test at [facility] location, pt knows to quarantine until she goes to hospital for cath Tuesday. I have discussed with patient and her husband, with her permission.

## 2020-07-23 ENCOUNTER — Other Ambulatory Visit (HOSPITAL_COMMUNITY): Payer: Medicare Other

## 2020-07-23 NOTE — Telephone Encounter (Signed)
Per Cath Lab protocol /Dr Gasper Sells will arrange pre-procedure hydration. Pt currently scheduled Tuesday May 10 arrive 5:30 AM for 7:30 AM procedure.  To accommodate hydration, will need to move procedure time,  Dr Irish Lack (Cath Lab AM only) would be unable to do procedure later in morning. I spoke with patient's husband to discuss rescheduling with Dr Irish Lack next available day on May 13. Pt's husband and patient prefer to schedule later in the morning on May 10 with another provider and request I reschedule with Dr Ellyn Hack.  Procedure has been rescheduled on Tuesday May 10  to 10:30 AM with Dr Ellyn Hack, arrive 5:30 AM for pre-procedure hydration.  I confirmed patient has stopped taking Entresto, knows to get BMP STAT Monday morning May 9 at [facility] and will follow up with patient later in the day on Monday once BMP results are available.

## 2020-07-23 NOTE — Telephone Encounter (Signed)
Called patient to ensure she is aware of changes r/t heart cath.  Webb Silversmith, RN has already went over these changes with the patient.  She has no further questions or concerns.

## 2020-07-26 ENCOUNTER — Other Ambulatory Visit: Payer: Medicare Other

## 2020-07-26 ENCOUNTER — Other Ambulatory Visit: Payer: Medicare Other | Admitting: *Deleted

## 2020-07-26 ENCOUNTER — Other Ambulatory Visit: Payer: Self-pay

## 2020-07-26 ENCOUNTER — Other Ambulatory Visit (HOSPITAL_COMMUNITY): Payer: Medicare Other

## 2020-07-26 ENCOUNTER — Other Ambulatory Visit (HOSPITAL_COMMUNITY)
Admission: RE | Admit: 2020-07-26 | Discharge: 2020-07-26 | Disposition: A | Discharge status: Home / Self Care | Payer: Medicare Other | Source: Ambulatory Visit | Attending: Interventional Cardiology | Admitting: Interventional Cardiology

## 2020-07-26 DIAGNOSIS — I25118 Atherosclerotic heart disease of native coronary artery with other forms of angina pectoris: Secondary | ICD-10-CM

## 2020-07-26 DIAGNOSIS — N1832 Chronic kidney disease, stage 3b: Secondary | ICD-10-CM

## 2020-07-26 DIAGNOSIS — Z01812 Encounter for preprocedural laboratory examination: Secondary | ICD-10-CM | POA: Insufficient documentation

## 2020-07-26 DIAGNOSIS — Z20822 Contact with and (suspected) exposure to covid-19: Secondary | ICD-10-CM | POA: Diagnosis not present

## 2020-07-26 LAB — BASIC METABOLIC PANEL
BUN/Creatinine Ratio: 19 (ref 12–28)
BUN: 28 mg/dL — ABNORMAL HIGH (ref 8–27)
CO2: 27 mmol/L (ref 20–29)
Calcium: 10.3 mg/dL (ref 8.7–10.3)
Chloride: 100 mmol/L (ref 96–106)
Creatinine, Ser: 1.47 mg/dL — ABNORMAL HIGH (ref 0.57–1.00)
Glucose: 262 mg/dL — ABNORMAL HIGH (ref 65–99)
Potassium: 4.6 mmol/L (ref 3.5–5.2)
Sodium: 137 mmol/L (ref 134–144)
eGFR: 38 mL/min/{1.73_m2} — ABNORMAL LOW (ref >59)

## 2020-07-26 LAB — SARS CORONAVIRUS 2 (TAT 6-24 HRS): SARS Coronavirus 2: NEGATIVE

## 2020-07-26 NOTE — Telephone Encounter (Signed)
07/26/20 GFR 38/Cr 1.47/k 4.6 -reviewed with patient's husband.  Pt contacted pre-catheterization scheduled at [facility] for: Tuesday Jul 27, 2020 10:30 AM Verified arrival time and place: [facility]) at: 5:30 AM   No solid food after midnight prior to cath, clear liquids until 5 AM day of procedure.  Hold: Metformin-day of procedure and 48 hours post procedure  Except hold medications AM meds can be  taken pre-cath with sips of water including: ASA 81 mg Brilinta 90 mg  Confirmed patient has responsible adult to drive home post procedure and be with patient first 24 hours after arriving home: yes  You are allowed ONE visitor in the waiting room during the time you are at the hospital for your procedure. Both you and your visitor must wear a mask once you enter the hospital.   Reviewed procedure/mask/visitor instructions, pre-procedure hydration with patient's husband  Per Dr Maryjean Morn IV orders standard weight based 63ml/kg/hr for one hour, then 1/ml/kg/hr continuous.

## 2020-07-27 ENCOUNTER — Other Ambulatory Visit (HOSPITAL_COMMUNITY): Payer: Self-pay

## 2020-07-27 ENCOUNTER — Encounter (HOSPITAL_COMMUNITY)
Admission: RE | Disposition: A | Discharge status: Home / Self Care | Payer: Self-pay | Source: Ambulatory Visit | Attending: Cardiology

## 2020-07-27 ENCOUNTER — Ambulatory Visit (HOSPITAL_COMMUNITY)
Admission: RE | Admit: 2020-07-27 | Discharge: 2020-07-27 | Disposition: A | Discharge status: Home / Self Care | Payer: Medicare Other | Source: Ambulatory Visit | Attending: Cardiology | Admitting: Cardiology

## 2020-07-27 DIAGNOSIS — I700 Atherosclerosis of aorta: Secondary | ICD-10-CM | POA: Insufficient documentation

## 2020-07-27 DIAGNOSIS — Z87891 Personal history of nicotine dependence: Secondary | ICD-10-CM | POA: Insufficient documentation

## 2020-07-27 DIAGNOSIS — I420 Dilated cardiomyopathy: Secondary | ICD-10-CM | POA: Diagnosis present

## 2020-07-27 DIAGNOSIS — I25119 Atherosclerotic heart disease of native coronary artery with unspecified angina pectoris: Secondary | ICD-10-CM | POA: Diagnosis present

## 2020-07-27 DIAGNOSIS — Z7982 Long term (current) use of aspirin: Secondary | ICD-10-CM | POA: Insufficient documentation

## 2020-07-27 DIAGNOSIS — I5042 Chronic combined systolic (congestive) and diastolic (congestive) heart failure: Secondary | ICD-10-CM | POA: Diagnosis not present

## 2020-07-27 DIAGNOSIS — E1122 Type 2 diabetes mellitus with diabetic chronic kidney disease: Secondary | ICD-10-CM | POA: Insufficient documentation

## 2020-07-27 DIAGNOSIS — I255 Ischemic cardiomyopathy: Secondary | ICD-10-CM | POA: Diagnosis not present

## 2020-07-27 DIAGNOSIS — N1832 Chronic kidney disease, stage 3b: Secondary | ICD-10-CM | POA: Insufficient documentation

## 2020-07-27 DIAGNOSIS — Z7984 Long term (current) use of oral hypoglycemic drugs: Secondary | ICD-10-CM | POA: Insufficient documentation

## 2020-07-27 DIAGNOSIS — Z79899 Other long term (current) drug therapy: Secondary | ICD-10-CM | POA: Insufficient documentation

## 2020-07-27 DIAGNOSIS — I25118 Atherosclerotic heart disease of native coronary artery with other forms of angina pectoris: Secondary | ICD-10-CM

## 2020-07-27 DIAGNOSIS — I130 Hypertensive heart and chronic kidney disease with heart failure and stage 1 through stage 4 chronic kidney disease, or unspecified chronic kidney disease: Secondary | ICD-10-CM | POA: Diagnosis not present

## 2020-07-27 DIAGNOSIS — I5041 Acute combined systolic (congestive) and diastolic (congestive) heart failure: Secondary | ICD-10-CM | POA: Diagnosis present

## 2020-07-27 DIAGNOSIS — Z95820 Peripheral vascular angioplasty status with implants and grafts: Secondary | ICD-10-CM

## 2020-07-27 HISTORY — PX: CORONARY STENT INTERVENTION: CATH118234

## 2020-07-27 LAB — POCT ACTIVATED CLOTTING TIME
Activated Clotting Time: 249 seconds
Activated Clotting Time: 309 seconds
Activated Clotting Time: 303 seconds

## 2020-07-27 LAB — GLUCOSE, CAPILLARY: Glucose-Capillary: 203 mg/dL — ABNORMAL HIGH (ref 70–99)

## 2020-07-27 SURGERY — CORONARY STENT INTERVENTION
Anesthesia: LOCAL

## 2020-07-27 MED ORDER — HEPARIN SODIUM (PORCINE) 1000 UNIT/ML IJ SOLN
INTRAMUSCULAR | Status: DC | PRN
  Administered 2020-07-27: 3000 [IU] via INTRAVENOUS
  Administered 2020-07-27: 6500 [IU] via INTRAVENOUS

## 2020-07-27 MED ORDER — HEPARIN (PORCINE) IN NACL 1000-0.9 UT/500ML-% IV SOLN
INTRAVENOUS | Status: AC
  Filled 2020-07-27: qty 1000

## 2020-07-27 MED ORDER — ONDANSETRON HCL 4 MG/2ML IJ SOLN: 4.0000 mg | Freq: Four times a day (QID) | INTRAMUSCULAR | Status: DC | PRN

## 2020-07-27 MED ORDER — ASPIRIN 81 MG PO CHEW: 81.0000 mg | CHEWABLE_TABLET | ORAL | Status: DC

## 2020-07-27 MED ORDER — SODIUM CHLORIDE 0.9 % WEIGHT BASED INFUSION: 1.0000 mL/kg/h | INTRAVENOUS | Status: DC

## 2020-07-27 MED ORDER — FENTANYL CITRATE (PF) 100 MCG/2ML IJ SOLN
INTRAMUSCULAR | Status: DC | PRN
  Administered 2020-07-27: 25 ug via INTRAVENOUS

## 2020-07-27 MED ORDER — NITROGLYCERIN 1 MG/10 ML FOR IR/CATH LAB
INTRA_ARTERIAL | Status: AC
  Filled 2020-07-27: qty 10

## 2020-07-27 MED ORDER — ASPIRIN 81 MG PO CHEW
81.0000 mg | CHEWABLE_TABLET | ORAL | Status: AC
  Administered 2020-07-27: 81 mg via ORAL
  Filled 2020-07-27: qty 1

## 2020-07-27 MED ORDER — ROPINIROLE HCL 0.5 MG PO TABS: 0.5000 mg | ORAL_TABLET | Freq: Two times a day (BID) | ORAL | Status: DC

## 2020-07-27 MED ORDER — MIDAZOLAM HCL 2 MG/2ML IJ SOLN
INTRAMUSCULAR | Status: AC
  Filled 2020-07-27: qty 2

## 2020-07-27 MED ORDER — LIDOCAINE HCL (PF) 1 % IJ SOLN
INTRAMUSCULAR | Status: AC
  Filled 2020-07-27: qty 30

## 2020-07-27 MED ORDER — HEPARIN (PORCINE) IN NACL 2-0.9 UNITS/ML
INTRAMUSCULAR | Status: DC | PRN
  Administered 2020-07-27: 10 mL via INTRA_ARTERIAL

## 2020-07-27 MED ORDER — SODIUM CHLORIDE 0.9 % WEIGHT BASED INFUSION
1.0000 mL/kg/h | INTRAVENOUS | Status: DC
  Administered 2020-07-27: 1 mL/kg/h via INTRAVENOUS

## 2020-07-27 MED ORDER — SODIUM CHLORIDE 0.9% FLUSH: 3.0000 mL | Freq: Two times a day (BID) | INTRAVENOUS | Status: DC

## 2020-07-27 MED ORDER — ACETAMINOPHEN 325 MG PO TABS: 650.0000 mg | ORAL_TABLET | ORAL | Status: DC | PRN

## 2020-07-27 MED ORDER — SODIUM CHLORIDE 0.9% FLUSH: 3.0000 mL | INTRAVENOUS | Status: DC | PRN

## 2020-07-27 MED ORDER — LIDOCAINE HCL (PF) 1 % IJ SOLN
INTRAMUSCULAR | Status: DC | PRN
  Administered 2020-07-27: 2 mL

## 2020-07-27 MED ORDER — PREGABALIN 50 MG PO CAPS: 50.0000 mg | ORAL_CAPSULE | Freq: Every day | ORAL | Status: DC

## 2020-07-27 MED ORDER — METOPROLOL SUCCINATE ER 100 MG PO TB24: 100.0000 mg | ORAL_TABLET | Freq: Every day | ORAL | Status: DC

## 2020-07-27 MED ORDER — ATORVASTATIN CALCIUM 40 MG PO TABS: 40.0000 mg | ORAL_TABLET | Freq: Every day | ORAL | Status: DC

## 2020-07-27 MED ORDER — MIDAZOLAM HCL 2 MG/2ML IJ SOLN
INTRAMUSCULAR | Status: DC | PRN
  Administered 2020-07-27: 2 mg via INTRAVENOUS

## 2020-07-27 MED ORDER — TICAGRELOR 90 MG PO TABS: 90.0000 mg | ORAL_TABLET | Freq: Two times a day (BID) | ORAL | Status: DC

## 2020-07-27 MED ORDER — VERAPAMIL HCL 2.5 MG/ML IV SOLN
INTRAVENOUS | Status: AC
  Filled 2020-07-27: qty 2

## 2020-07-27 MED ORDER — FENTANYL CITRATE (PF) 100 MCG/2ML IJ SOLN
INTRAMUSCULAR | Status: AC
  Filled 2020-07-27: qty 2

## 2020-07-27 MED ORDER — LABETALOL HCL 5 MG/ML IV SOLN: 10.0000 mg | INTRAVENOUS | Status: DC | PRN

## 2020-07-27 MED ORDER — HEPARIN (PORCINE) IN NACL 1000-0.9 UT/500ML-% IV SOLN
INTRAVENOUS | Status: DC | PRN
  Administered 2020-07-27 (×3): 500 mL

## 2020-07-27 MED ORDER — HYDROXYUREA 500 MG PO CAPS: 500.0000 mg | ORAL_CAPSULE | Freq: Every day | ORAL | Status: DC

## 2020-07-27 MED ORDER — IOHEXOL 350 MG/ML SOLN
INTRAVENOUS | Status: DC | PRN
Start: 2020-07-27 — End: 2020-07-27
  Administered 2020-07-27: 100 mL

## 2020-07-27 MED ORDER — SODIUM CHLORIDE 0.9 % WEIGHT BASED INFUSION
3.0000 mL/kg/h | INTRAVENOUS | Status: AC
  Administered 2020-07-27: 3 mL/kg/h via INTRAVENOUS

## 2020-07-27 MED ORDER — TICAGRELOR 90 MG PO TABS
90.0000 mg | ORAL_TABLET | Freq: Once | ORAL | Status: AC
  Administered 2020-07-27: 90 mg via ORAL
  Filled 2020-07-27: qty 1

## 2020-07-27 MED ORDER — FENOFIBRATE 160 MG PO TABS: 160.0000 mg | ORAL_TABLET | Freq: Every day | ORAL | Status: DC

## 2020-07-27 MED ORDER — HEPARIN SODIUM (PORCINE) 1000 UNIT/ML IJ SOLN
INTRAMUSCULAR | Status: AC
  Filled 2020-07-27: qty 1

## 2020-07-27 MED ORDER — SODIUM CHLORIDE 0.9 % IV SOLN: 250.0000 mL | INTRAVENOUS | Status: DC | PRN

## 2020-07-27 MED ORDER — HYDRALAZINE HCL 20 MG/ML IJ SOLN: 10.0000 mg | INTRAMUSCULAR | Status: DC | PRN

## 2020-07-27 MED ORDER — NITROGLYCERIN 1 MG/10 ML FOR IR/CATH LAB
INTRA_ARTERIAL | Status: DC | PRN
  Administered 2020-07-27 (×4): 200 ug via INTRACORONARY

## 2020-07-27 MED ORDER — HEPARIN (PORCINE) IN NACL 1000-0.9 UT/500ML-% IV SOLN
INTRAVENOUS | Status: AC
  Filled 2020-07-27: qty 500

## 2020-07-27 SURGICAL SUPPLY — 23 items
BALL SAPPHIRE NC24 3.0X22 (BALLOONS) ×2
BALLN EMERGE MR 2.0X12 (BALLOONS) ×2
BALLN SAPPHIRE 2.5X15 (BALLOONS) ×2
BALLOON EMERGE MR 2.0X12 (BALLOONS) IMPLANT
BALLOON SAPPHIRE 2.5X15 (BALLOONS) IMPLANT
BALLOON SAPPHIRE NC24 3.0X22 (BALLOONS) IMPLANT
CATH VISTA GUIDE 6FR XBLAD3.5 (CATHETERS) ×1 IMPLANT
DEVICE RAD COMP TR BAND LRG (VASCULAR PRODUCTS) ×1 IMPLANT
GLIDESHEATH SLEND SS 6F .021 (SHEATH) ×1 IMPLANT
GUIDEWIRE INQWIRE 1.5J.035X260 (WIRE) IMPLANT
INQWIRE 1.5J .035X260CM (WIRE) ×2
KIT ENCORE 26 ADVANTAGE (KITS) ×1 IMPLANT
KIT HEART LEFT (KITS) ×2 IMPLANT
PACK CARDIAC CATHETERIZATION (CUSTOM PROCEDURE TRAY) ×2 IMPLANT
SHEATH PROBE COVER 6X72 (BAG) ×1 IMPLANT
STENT SYNERGY XD 2.25X12 (Permanent Stent) IMPLANT
STENT SYNERGY XD 2.75X38 (Permanent Stent) IMPLANT
SYNERGY XD 2.25X12 (Permanent Stent) ×2 IMPLANT
SYNERGY XD 2.75X38 (Permanent Stent) ×2 IMPLANT
TRANSDUCER W/STOPCOCK (MISCELLANEOUS) ×2 IMPLANT
TUBING CIL FLEX 10 FLL-RA (TUBING) ×2 IMPLANT
WIRE ASAHI PROWATER 180CM (WIRE) ×1 IMPLANT
WIRE HI TORQ BMW 190CM (WIRE) ×1 IMPLANT

## 2020-07-27 NOTE — Interval H&P Note (Signed)
History and Physical Interval Note:  07/27/2020 10:26 AM  Anna [name]  has presented today for surgery, with the diagnosis of 2 vessel disease.  The various methods of treatment have been discussed with the patient and family. After consideration of risks, benefits and other options for treatment, the patient has consented to  Procedure(s): CORONARY STENT INTERVENTION (N/A) as a surgical intervention.  The patient's history has been reviewed, patient examined, no change in status, stable for surgery.  I have reviewed the patient's chart and labs.  Questions were answered to the patient's satisfaction.    Cath Lab Visit (complete for each Cath Lab visit)  Clinical Evaluation Leading to the Procedure:   ACS: No.  Non-ACS:    Anginal Classification: CCS III  Anti-ischemic medical therapy: Minimal Therapy (1 class of medications)  Non-Invasive Test Results: Intermediate-risk stress test findings: cardiac mortality 1-3%/year - reduced EF on Echo; but confirmed + CAD on Dx Cath  Prior CABG: No previous CABG    Glenetta Hew

## 2020-07-27 NOTE — Progress Notes (Signed)
[id number] Education completed with pt and husband who voiced understanding. Stressed importance of brilinta with stent. Reviewed NTG use, heart healthy and low sodium (2000 mg) food choices and gave handouts. Encouraged daily weights which pt stated she has been doing. Did not give ex ed as pt only able to walk short distances in home with walker and sometimes without. Referred to [place] CRP 2 but pt unable to do at this time. Stated she has spinal issues which prohibit program at this time. Graylon Good RN BSN 07/27/2020 1:51 PM

## 2020-07-27 NOTE — Discharge Summary (Signed)
Discharge Summary for Same Day PCI   Patient ID: Anna [name] MRN: [id number]; DOB: 1950/04/19  Admit date: 07/27/2020 Discharge date: 07/27/2020  Primary Care Provider: Leanna Battles, MD  Primary Cardiologist: Werner Lean, MD  Primary Electrophysiologist:  None   Discharge Diagnoses    Principal Problem:   Coronary artery disease involving native coronary artery of native heart with angina pectoris [facility]) Active Problems:   Acute combined systolic and diastolic heart failure ([place])   Dilated cardiomyopathy ([place])   Chronic combined systolic and diastolic heart failure [facility])  Diagnostic Studies/Procedures    Cardiac Catheterization 07/27/2020:   LESION Seg #1: Ost LAD to Prox LAD lesion is 60% stenosed. Prox LAD to Mid LAD lesion is 70% stenosed with 50% stenosed side branch in 1st Sept. Mid LAD lesion is 70% stenosed.  A drug-eluting stent was successfully placed -covering all 3 lesions, using a SYNERGY XD [id number]. Postdilated to 3.0 mm  Post intervention, there is a 0% residual stenosis. Post intervention, the SEPTAL side branch was reduced to 50% residual stenosis.  -------------------------------  LESION #2: 2nd Mrg lesion is 80% stenosed.  A drug-eluting stent was successfully placed using a SYNERGY XD 2.25X12 -> deployed to 2.3 mm  Post intervention, there is a 0% residual stenosis. Slight step up approximately, but otherwise normal.  ---------------------------------------  Prox Cx to Mid Cx lesion is 40% stenosed with 55% stenosed side branch in 1st Mrg.   Successful 2 Vessel DES PCI: --> Ostial and proximal to mid LAD long sequential lesions treated with Synergy DES 2.75 mm x 38 mm postdilated to 3.0 mm. --> Mid OM 2 focal lesion treated with Synergy DES 2.25 mm x 12 mm deployed to 2.3 mm.   RECOMMENDATIONS  Continue to hold Entresto based on renal insufficiency after initially starting.  Will defer to Dr. Gasper Sells (primary  cardiologist)  Continue statin plus fibrate per primary cardiologist.  Gentle hydration for 8 hours and okay for same-day discharge  Antiplatelet/anticoagulation recommendations noted below  Glenetta Hew, MD  Diagnostic Dominance: Co-dominant    Intervention     _____________   History of Present Illness     Anna [name] is a 70 y.o. female with  a hx of frequent UTI, HTN, DM type 2, CKD stage IIIb, hyperlipidemia, peripheral neuropathy, chronic back pain, arthritis,essential thrombocytosis, restless leg syndrome,who is currently admitted for sepsis due to UTI. During 4/21 admission had new HFrEF and LAD and OM disease.  Initial plan was for staged intervention but Cr remained elevated and was deferred.   Patient noted that she was having significant back pain. Since last visit noted no changes.  Relevant interval testing or therapy include Entresto start.    No chest pain or pressure .  Still felt DOE but no PND/Orthopnea.  No weight gain or leg swelling.  No palpitations or syncope. Bilateral leg claudication pain with walking around the house. With stable renal function, she was set up for outpatient cath.  Hospital Course     The patient underwent cardiac cath as noted above with successful 2v PCI/DES to p/mLAD and mid OM focal lesion. Plan for DAPT with ASA/Brilinta for at least 6 months. Ok to stop ASA at 3-6 months but continue to treat with Brilinta for one year. Consider maintenance dose of Brilinta of 60mg  BID thereafter. The patient was seen by cardiac rehab while in short stay. There were no observed complications post cath. Radial cath site was re-evaluated prior to discharge and found to  be stable without any complications. Instructions/precautions regarding cath site care were given prior to discharge. Recommendations to continue to hold Entresto, continue statin and fenofibrate  Donzetta Starch was seen by Dr. Ellyn Hack and determined stable for discharge home.  Follow up with our office has been arranged. Medications are listed below. Pertinent changes include N/a.  _____________  Cath/PCI Registry Performance & Quality Measures: 1. Aspirin prescribed? - Yes 2. ADP Receptor Inhibitor (Plavix/Clopidogrel, Brilinta/Ticagrelor or Effient/Prasugrel) prescribed (includes medically managed patients)? - Yes 3. High Intensity Statin (Lipitor 40-80mg  or Crestor 20-40mg ) prescribed? - Yes 4. For EF <40%, was ACEI/ARB prescribed? - No, elevated Cr 5. For EF <40%, Aldosterone Antagonist (Spironolactone or Eplerenone) prescribed? - No - Reason:  elevated Cr 6. Cardiac Rehab Phase II ordered (Included Medically managed Patients)? - Yes  _____________   Discharge Vitals Blood pressure (!) 154/97, pulse (!) 58, temperature 98.3 F (36.8 C), temperature source Oral, resp. rate 14, height 5\' 5"  (1.651 m), weight 71.7 kg, SpO2 99 %.  Filed Weights   07/27/20 0525  Weight: 71.7 kg    Last Labs & Radiologic Studies    CBC No results for input(s): WBC, NEUTROABS, HGB, HCT, MCV, PLT in the last 72 hours. Basic Metabolic Panel Recent Labs    07/26/20 0750  NA 137  K 4.6  CL 100  CO2 27  GLUCOSE 262*  BUN 28*  CREATININE 1.47*  CALCIUM 10.3   Liver Function Tests No results for input(s): AST, ALT, ALKPHOS, BILITOT, PROT, ALBUMIN in the last 72 hours. No results for input(s): LIPASE, AMYLASE in the last 72 hours. High Sensitivity Troponin:   Recent Labs  Lab 06/28/20 2315 06/29/20 0246 06/29/20 2305  TROPONINIHS 1,034* 932* 375*    BNP Invalid input(s): POCBNP D-Dimer No results for input(s): DDIMER in the last 72 hours. Hemoglobin A1C No results for input(s): HGBA1C in the last 72 hours. Fasting Lipid Panel No results for input(s): CHOL, HDL, LDLCALC, TRIG, CHOLHDL, LDLDIRECT in the last 72 hours. Thyroid Function Tests No results for input(s): TSH, T4TOTAL, T3FREE, THYROIDAB in the last 72 hours.  Invalid input(s):  FREET3 _____________  CT Angio Chest PE W/Cm &/Or Wo Cm  Result Date: 06/28/2020 CLINICAL DATA:  PE suspected, high prob Shortness of breath.  Weakness.  Tachycardia. EXAM: CT ANGIOGRAPHY CHEST WITH CONTRAST TECHNIQUE: Multidetector CT imaging of the chest was performed using the standard protocol during bolus administration of intravenous contrast. Multiplanar CT image reconstructions and MIPs were obtained to evaluate the vascular anatomy. CONTRAST:  65mL OMNIPAQUE IOHEXOL 350 MG/ML SOLN COMPARISON:  Radiograph earlier this day.  Chest CTA 01/14/2020 FINDINGS: Cardiovascular: There are no filling defects within the pulmonary arteries to suggest pulmonary embolus. Aortic atherosclerosis. Conventional branching pattern from the aortic arch. No evidence of dissection or acute aortic findings. Normal heart size. Small amount of pericardial fluid anteriorly is similar to prior exam. Coronary artery calcifications. Mediastinum/Nodes: No enlarged mediastinal or hilar lymph nodes. No thyroid nodule. No esophageal wall thickening. Lungs/Pleura: Linear right upper lobe scarring is unchanged from prior exam. Previous pleural effusions have resolved. Previous septal thickening has resolved. No acute airspace disease. Trachea and central bronchi are patent. Mild central bronchial thickening. Upper Abdomen: No acute findings. Vascular calcifications. Suspected hepatic steatosis. Musculoskeletal: There are no acute or suspicious osseous abnormalities. Review of the MIP images confirms the above findings. IMPRESSION: 1. No pulmonary embolus. 2. Mild central bronchial thickening, can be seen with bronchitis or reactive airways disease. 3. Right upper lobe scarring.  4. Aortic atherosclerosis.  Coronary artery calcifications. Aortic Atherosclerosis (ICD10-I70.0). Electronically Signed   By: Keith Rake M.D.   On: 06/28/2020 19:45   CARDIAC CATHETERIZATION  Result Date: 07/27/2020  LESION Seg #1: Ost LAD to Prox LAD  lesion is 60% stenosed. Prox LAD to Mid LAD lesion is 70% stenosed with 50% stenosed side branch in 1st Sept. Mid LAD lesion is 70% stenosed.  A drug-eluting stent was successfully placed -covering all 3 lesions, using a SYNERGY XD [id number]. Postdilated to 3.0 mm  Post intervention, there is a 0% residual stenosis. Post intervention, the SEPTAL side branch was reduced to 50% residual stenosis.  -------------------------------  LESION #2: 2nd Mrg lesion is 80% stenosed.  A drug-eluting stent was successfully placed using a SYNERGY XD 2.25X12 -> deployed to 2.3 mm  Post intervention, there is a 0% residual stenosis. Slight step up approximately, but otherwise normal.  ---------------------------------------  Prox Cx to Mid Cx lesion is 40% stenosed with 55% stenosed side branch in 1st Mrg.  Successful 2 Vessel DES PCI: --> Ostial and proximal to mid LAD long sequential lesions treated with Synergy DES 2.75 mm x 38 mm postdilated to 3.0 mm. --> Mid OM 2 focal lesion treated with Synergy DES 2.25 mm x 12 mm deployed to 2.3 mm. RECOMMENDATIONS  Continue to hold Entresto based on renal insufficiency after initially starting.  Will defer to Dr. Gasper Sells (primary cardiologist)  Continue statin plus fibrate per primary cardiologist.  Gentle hydration for 8 hours and okay for same-day discharge  Antiplatelet/anticoagulation recommendations noted below Glenetta Hew, MD  CARDIAC CATHETERIZATION  Result Date: 07/01/2020  Ost LAD to Prox LAD lesion is 60% stenosed. Prox LAD to Mid LAD lesion is 70% stenosed with 50% stenosed side branch in 1st Sept.  Mid LAD lesion is 70% stenosed.  Prox Cx to Mid Cx lesion is 40% stenosed with 55% stenosed side branch in 1st Mrg.  2nd Mrg lesion is 80% stenosed.  LV end diastolic pressure is severely elevated.  Hemodynamic findings consistent with mild pulmonary hypertension.  Hemodynamic findings are consistent with severe cardiomyopathy  Hemodynamics consistent  ACUTE COMBINED SYSTOLIC AND DIASTOLIC HEART FAILURE  SUMMARY  Severe two-vessel disease (codominant system);  extensive LAD disease: Ostial (60%), proximal (segmental 70%)  and focal mid 70% just after major 1st Diag branch  Focal eccentric 80% 2nd Mrg  Mild Secondary Pulmonary Hypertension with mean PAP of 26 mmHg, PCWP and LVEDP of 26 and 27 mmHg.  RVEDP and RAP 92mmHg  Moderate to severely reduced cardiac Output-Index: (Fick) 4.45-2.38; Thermodilution's 2.96-1.58.  Findings consistent with ACUTE COMBINED SYSTOLIC AND DIASTOLIC HEART FAILURE with likely ischemic contribution, but EF out of portion due to existing CAD. RECOMMENDATIONS:  The patient be transferred to cardiac unit for post-cath care.  As per discussion with Dr. Gasper Sells -> we will diurese starting with 40 mg IV Lasix in the Cath Lab followed by 40 mg IV twice daily starting tomorrow.  Initiate GDMT for ICM and load with Brilinta over the weekend  Once stable from a volume and renal function standpoint, would anticipate staged PCI of the LAD and OM 2 early next week. Glenetta Hew, MD   US RENAL  Result Date: 06/29/2020 CLINICAL DATA:  Acute kidney injury EXAM: RENAL / URINARY TRACT ULTRASOUND COMPLETE COMPARISON:  Ultrasound 01/09/2019 FINDINGS: Right Kidney: Renal measurements: 9.4 x 4.2 x 3.9 cm = volume: 81.5 mL. Cortical echogenicity is within normal limits. No mass or hydronephrosis. Possible areas of cortical thinning and  scarring. Left Kidney: Renal measurements: 10.5 x 4.9 x 4 cm = volume: 107.1 mL. Cortical echogenicity within normal limits possible cortical thinning at the upper pole. Bladder: Appears normal for degree of bladder distention. Other: None. IMPRESSION: 1. Negative for hydronephrosis. 2. Probable cortical thinning upper pole left kidney and possible cortical thinning scarring in the right kidney Electronically Signed   By: Donavan Foil M.D.   On: 06/29/2020 19:43   DG Chest Port 1 View  Result Date:  06/30/2020 CLINICAL DATA:  Shortness of breath EXAM: PORTABLE CHEST 1 VIEW COMPARISON:  06/29/2020 FINDINGS: Cardiac shadow is stable. Significant improved aeration is noted bilaterally with the exception of some mild right basilar atelectasis. No new focal abnormality is seen. IMPRESSION: Mild right basilar atelectasis. Otherwise significant improved aeration bilaterally Electronically Signed   By: Inez Catalina M.D.   On: 06/30/2020 08:55   DG CHEST PORT 1 VIEW  Result Date: 06/29/2020 CLINICAL DATA:  Dyspnea. EXAM: PORTABLE CHEST 1 VIEW COMPARISON:  Radiographs and CT earlier today. FINDINGS: Stable heart size and mediastinal contours. Again seen diffuse bronchial thickening. No acute or progressive airspace disease. Linear scarring in the left mid lung. No pleural effusion or pneumothorax. Stable osseous structures. IMPRESSION: Diffuse bronchial thickening, unchanged from CT earlier this day. No acute or progressive airspace disease. Electronically Signed   By: Keith Rake M.D.   On: 06/29/2020 21:17   DG Chest Port 1 View  Result Date: 06/28/2020 CLINICAL DATA:  Fever and shortness of breath. EXAM: PORTABLE CHEST 1 VIEW COMPARISON:  June 28, 2020 (4:44 p.m.) FINDINGS: Decreased lung volumes are again seen with mild diffusely increased interstitial lung markings. This represents a new finding when compared to the prior study. Mild areas of atelectasis and/or infiltrate are seen within the bilateral infrahilar regions and medial aspect of the upper left lung. There is no evidence of a pleural effusion or pneumothorax. The heart size and mediastinal contours are within normal limits. The visualized skeletal structures are unremarkable. IMPRESSION: 1. Interval development of mild left upper lobe and bilateral infrahilar atelectasis and/or infiltrate since the prior study. 2. Additional findings consistent with mild interstitial edema. Electronically Signed   By: Virgina Norfolk M.D.   On: 06/28/2020  22:29   DG Chest Port 1 View  Result Date: 06/28/2020 CLINICAL DATA:  Questionable sepsis, fever, shortness of breath, weakness, tachycardia EXAM: PORTABLE CHEST 1 VIEW COMPARISON:  01/13/2020 FINDINGS: Cardiomegaly. Low volume AP portable examination without acute airspace opacity. The visualized skeletal structures are unremarkable. IMPRESSION: Cardiomegaly without acute abnormality of the lungs in AP portable projection. Electronically Signed   By: Eddie Candle M.D.   On: 06/28/2020 16:56   ECHOCARDIOGRAM COMPLETE  Result Date: 06/30/2020    ECHOCARDIOGRAM REPORT   Patient Name:   ZELA SOBIESKI Date of Exam: 06/29/2020 Medical Rec #:  [id number]      Height:       64.0 in Accession #:    [id number]     Weight:       157.8 lb Date of Birth:  Feb 16, 1951      BSA:          1.769 m Patient Age:    41 years       BP:           110/75 mmHg Patient Gender: F              HR:           115 bpm. Exam Location:  Inpatient Procedure: 2D Echo, Cardiac Doppler and Color Doppler Indications:    R01.1 Murmur  History:        Patient has no prior history of Echocardiogram examinations.                 Signs/Symptoms:Shortness of Breath.  Sonographer:    [name] RDCS Referring Phys: [id number] Margaree Mackintosh Hancock  1. Left ventricular ejection fraction, by estimation, is 35 to 40%. Left ventricular ejection fraction by 3D volume is 37 %. The left ventricle has moderately decreased function. The left ventricle demonstrates regional wall motion abnormalities (see scoring diagram/findings for description) in the mid and apical distribution. This can be seen in stress mediated cardiomyopathy. No LV thrombus visualized. There is mild concentric left ventricular hypertrophy. Indeterminate diastolic filling due to E-A  fusion.  2. Right ventricular systolic function is normal. The right ventricular size is normal.  3. A small pericardial effusion is present.  4. The mitral valve is grossly normal. Trivial mitral valve  regurgitation.  5. The aortic valve is tricuspid. There is mild calcification of the aortic valve. Aortic valve regurgitation is not visualized. Mild aortic valve sclerosis is present, with no evidence of aortic valve stenosis.  6. The inferior vena cava is normal in size with greater than 50% respiratory variability, suggesting right atrial pressure of 3 mmHg. Comparison(s): No prior Echocardiogram. FINDINGS  Left Ventricle: Left ventricular ejection fraction, by estimation, is 35 to 40%. Left ventricular ejection fraction by 3D volume is 37 %. The left ventricle has moderately decreased function. The left ventricle demonstrates regional wall motion abnormalities. The left ventricular internal cavity size was normal in size. There is mild concentric left ventricular hypertrophy. Indeterminate diastolic filling due to E-A fusion.  LV Wall Scoring: The mid and distal lateral wall, mid and distal anterior septum, entire apex, mid anterolateral segment, and mid inferoseptal segment are hypokinetic. Right Ventricle: The right ventricular size is normal. No increase in right ventricular wall thickness. Right ventricular systolic function is normal. Left Atrium: Left atrial size was normal in size. Right Atrium: Right atrial size was normal in size. Pericardium: A small pericardial effusion is present. Mitral Valve: The mitral valve is grossly normal. Trivial mitral valve regurgitation. Tricuspid Valve: The tricuspid valve is normal in structure. Tricuspid valve regurgitation is not demonstrated. No evidence of tricuspid stenosis. Aortic Valve: The aortic valve is tricuspid. There is mild calcification of the aortic valve. Aortic valve regurgitation is not visualized. Mild aortic valve sclerosis is present, with no evidence of aortic valve stenosis. Aortic valve mean gradient measures 6.0 mmHg. Aortic valve peak gradient measures 10.8 mmHg. Aortic valve area, by VTI measures 1.13 cm. Pulmonic Valve: The pulmonic valve  was grossly normal. Pulmonic valve regurgitation is not visualized. No evidence of pulmonic stenosis. Aorta: The aortic root is normal in size and structure. Venous: The inferior vena cava is normal in size with greater than 50% respiratory variability, suggesting right atrial pressure of 3 mmHg. IAS/Shunts: The atrial septum is grossly normal.  LEFT VENTRICLE PLAX 2D LVIDd:         5.00 cm LVIDs:         3.70 cm LV PW:         1.10 cm         3D Volume EF LV IVS:        1.10 cm         LV 3D EF:    Left LVOT diam:  1.80 cm                      ventricular LV SV:         28                           ejection LV SV Index:   16                           fraction by LVOT Area:     2.54 cm                     3D volume                                             is 37 %.  LV Volumes (MOD) LV vol d, MOD    77.8 ml       3D Volume EF: A2C:                           3D EF:        37 % LV vol d, MOD    63.9 ml       LV EDV:       146 ml A4C:                           LV ESV:       91 ml LV vol s, MOD    52.6 ml       LV SV:        54 ml A2C: LV vol s, MOD    50.2 ml A4C: LV SV MOD A2C:   25.2 ml LV SV MOD A4C:   63.9 ml LV SV MOD BP:    22.8 ml RIGHT VENTRICLE             IVC RV Basal diam:  2.90 cm     IVC diam: 1.80 cm RV S prime:     11.60 cm/s TAPSE (M-mode): 1.6 cm LEFT ATRIUM             Index       RIGHT ATRIUM           Index LA diam:        4.20 cm 2.37 cm/m  RA Area:     10.70 cm LA Vol (A2C):   46.6 ml 26.34 ml/m RA Volume:   24.10 ml  13.62 ml/m LA Vol (A4C):   41.2 ml 23.29 ml/m LA Biplane Vol: 46.8 ml 26.45 ml/m  AORTIC VALVE AV Area (Vmax):    1.20 cm AV Area (Vmean):   1.17 cm AV Area (VTI):     1.13 cm AV Vmax:           164.00 cm/s AV Vmean:          115.000 cm/s AV VTI:            0.246 m AV Peak Grad:      10.8 mmHg AV Mean Grad:      6.0 mmHg LVOT Vmax:         77.10 cm/s LVOT Vmean:        53.100  cm/s LVOT VTI:          0.109 m LVOT/AV VTI ratio: 0.44  AORTA Ao Root diam: 2.60 cm  SHUNTS  Systemic VTI:  0.11 m Systemic Diam: 1.80 cm Rudean Haskell MD Electronically signed by Rudean Haskell MD Signature Date/Time: 06/30/2020/9:19:12 AM    Final     Disposition   Pt is being discharged home today in good condition.  Follow-up Plans & Appointments     Follow-up Information    Loel Dubonnet, NP Follow up on 08/11/2020.   Specialty: Cardiology Why: at 3:15pm for your follow up appt.  Contact information: [address]  [address] [place] [address] [phone]              Discharge Instructions    Amb Referral to Cardiac Rehabilitation   Complete by: As directed    Diagnosis: Coronary Stents   After initial evaluation and assessments completed: Virtual Based Care may be provided alone or in conjunction with Phase 2 Cardiac Rehab based on patient barriers.: Yes       Discharge Medications   Allergies as of 07/27/2020      Reactions   Simvastatin Other (See Comments)   unknown   Benadryl [diphenhydramine Hcl] Other (See Comments)   chills      Medication List    TAKE these medications   acetaminophen 325 MG tablet Commonly known as: TYLENOL Take 2 tablets (650 mg total) by mouth every 6 (six) hours as needed for mild pain, fever or headache.   ascorbic acid 500 MG tablet Commonly known as: VITAMIN C Take 500 mg by mouth daily.   aspirin EC 81 MG tablet Take 81 mg by mouth daily.   atorvastatin 40 MG tablet Commonly known as: LIPITOR Take 40 mg by mouth daily.   Brilinta 90 MG Tabs tablet Generic drug: ticagrelor Take 1 tablet (90 mg total) by mouth 2 (two) times daily.   calcium carbonate 1500 (600 Ca) MG Tabs tablet Commonly known as: OSCAL Take 600 mg by mouth daily.   Cranberry 425 MG Caps Take 425 mg by mouth daily.   fenofibrate 160 MG tablet Take 1 tablet (160 mg total) by mouth daily.   GLUCOSAMINE PO Take 1,000 mg by mouth daily.   hydroxyurea 500 MG capsule Commonly known as: HYDREA Take 1 capsule  (500 mg total) by mouth daily. May take with food to minimize GI side effects.   Magnesium 200 MG Tabs Take 200 mg by mouth daily.   metFORMIN 500 MG 24 hr tablet Commonly known as: GLUCOPHAGE-XR Take 500 mg by mouth daily with breakfast.   metoprolol succinate 100 MG 24 hr tablet Commonly known as: TOPROL-XL Take 1 tablet (100 mg total) by mouth daily. Take with or immediately following a meal.   pregabalin 50 MG capsule Commonly known as: LYRICA Take 50 mg by mouth in the morning and at bedtime.   rOPINIRole 0.5 MG tablet Commonly known as: REQUIP Take 1 tablet (0.5 mg total) by mouth 2 (two) times daily.   saccharomyces boulardii 250 MG capsule Commonly known as: FLORASTOR Take 250 mg by mouth daily.       Allergies Allergies  Allergen Reactions  . Simvastatin Other (See Comments)    unknown  . Benadryl [Diphenhydramine Hcl] Other (See Comments)    chills    Outstanding Labs/Studies   N/a   Duration of Discharge Encounter   Greater than 30 minutes including physician time.  Signed, Reino Bellis, NP  07/27/2020, 2:53 PM

## 2020-07-27 NOTE — Discharge Instructions (Signed)
Radial Site Care  This sheet gives you information about how to care for yourself after your procedure. Your health care provider may also give you more specific instructions. If you have problems or questions, contact your health care provider. What can I expect after the procedure? After the procedure, it is common to have:  Bruising and tenderness at the catheter insertion area. Follow these instructions at home: Medicines  Take over-the-counter and prescription medicines only as told by your health care provider. Insertion site care  Follow instructions from your health care provider about how to take care of your insertion site. Make sure you: ? Wash your hands with soap and water before you change your bandage (dressing). If soap and water are not available, use hand sanitizer. ? Change your dressing as told by your health care provider. ? Leave stitches (sutures), skin glue, or adhesive strips in place. These skin closures may need to stay in place for 2 weeks or longer. If adhesive strip edges start to loosen and curl up, you may trim the loose edges. Do not remove adhesive strips completely unless your health care provider tells you to do that.  Check your insertion site every day for signs of infection. Check for: ? Redness, swelling, or pain. ? Fluid or blood. ? Pus or a bad smell. ? Warmth.  Do not take baths, swim, or use a hot tub until your health care provider approves.  You may shower 24-48 hours after the procedure, or as directed by your health care provider. ? Remove the dressing and gently wash the site with plain soap and water. ? Pat the area dry with a clean towel. ? Do not rub the site. That could cause bleeding.  Do not apply powder or lotion to the site. Activity  For 24 hours after the procedure, or as directed by your health care provider: ? Do not flex or bend the affected arm. ? Do not push or pull heavy objects with the affected arm. ? Do not drive  yourself home from the hospital or clinic. You may drive 24 hours after the procedure unless your health care provider tells you not to. ? Do not operate machinery or power tools.  Do not lift anything that is heavier than 10 lb (4.5 kg), or the limit that you are told, until your health care provider says that it is safe.  Ask your health care provider when it is okay to: ? Return to work or school. ? Resume usual physical activities or sports. ? Resume sexual activity.   General instructions  If the catheter site starts to bleed, raise your arm and put firm pressure on the site. If the bleeding does not stop, get help right away. This is a medical emergency.  If you went home on the same day as your procedure, a responsible adult should be with you for the first 24 hours after you arrive home.  Keep all follow-up visits as told by your health care provider. This is important. Contact a health care provider if:  You have a fever.  You have redness, swelling, or yellow drainage around your insertion site. Get help right away if:  You have unusual pain at the radial site.  The catheter insertion area swells very fast.  The insertion area is bleeding, and the bleeding does not stop when you hold steady pressure on the area.  Your arm or hand becomes pale, cool, tingly, or numb. These symptoms may represent a serious   problem that is an emergency. Do not wait to see if the symptoms will go away. Get medical help right away. Call your local emergency services (911 in the [place] Do not drive yourself to the hospital. Summary  After the procedure, it is common to have bruising and tenderness at the site.  Follow instructions from your health care provider about how to take care of your radial site wound. Check the wound every day for signs of infection.  Do not lift anything that is heavier than 10 lb (4.5 kg), or the limit that you are told, until your health care provider says that it  is safe. This information is not intended to replace advice given to you by your health care provider. Make sure you discuss any questions you have with your health care provider. Document Revised: 04/11/2017 Document Reviewed: 04/11/2017 Elsevier Patient Education  2021 Elsevier Inc.  

## 2020-07-28 ENCOUNTER — Encounter (HOSPITAL_COMMUNITY): Payer: Self-pay | Admitting: Cardiology

## 2020-07-30 ENCOUNTER — Other Ambulatory Visit: Payer: Self-pay | Admitting: *Deleted

## 2020-07-30 MED ORDER — METOPROLOL SUCCINATE ER 100 MG PO TB24: 100.0000 mg | ORAL_TABLET | Freq: Every day | ORAL | 0 refills | Status: DC
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
## 2020-08-06 ENCOUNTER — Other Ambulatory Visit: Payer: Self-pay

## 2020-08-06 ENCOUNTER — Inpatient Hospital Stay: Payer: Medicare Other

## 2020-08-06 ENCOUNTER — Inpatient Hospital Stay: Payer: Medicare Other | Attending: Hematology | Admitting: Hematology

## 2020-08-06 VITALS — BP 149/62 | HR 51 | Temp 96.0°F | Resp 17 | Ht 65.0 in | Wt 159.0 lb

## 2020-08-06 DIAGNOSIS — D473 Essential (hemorrhagic) thrombocythemia: Secondary | ICD-10-CM

## 2020-08-06 DIAGNOSIS — Z1589 Genetic susceptibility to other disease: Secondary | ICD-10-CM

## 2020-08-06 DIAGNOSIS — Z87891 Personal history of nicotine dependence: Secondary | ICD-10-CM | POA: Insufficient documentation

## 2020-08-06 DIAGNOSIS — E119 Type 2 diabetes mellitus without complications: Secondary | ICD-10-CM | POA: Insufficient documentation

## 2020-08-06 DIAGNOSIS — Z7984 Long term (current) use of oral hypoglycemic drugs: Secondary | ICD-10-CM | POA: Diagnosis not present

## 2020-08-06 LAB — CBC WITH DIFFERENTIAL/PLATELET
Abs Immature Granulocytes: 0.03 10*3/uL (ref 0.00–0.07)
Basophils Absolute: 0.1 10*3/uL (ref 0.0–0.1)
Basophils Relative: 1 %
Eosinophils Absolute: 0 10*3/uL (ref 0.0–0.5)
Eosinophils Relative: 1 %
HCT: 36.3 % (ref 36.0–46.0)
Hemoglobin: 11.6 g/dL — ABNORMAL LOW (ref 12.0–15.0)
Immature Granulocytes: 1 %
Lymphocytes Relative: 16 %
Lymphs Abs: 1 10*3/uL (ref 0.7–4.0)
MCH: 31.8 pg (ref 26.0–34.0)
MCHC: 32 g/dL (ref 30.0–36.0)
MCV: 99.5 fL (ref 80.0–100.0)
Monocytes Absolute: 0.4 10*3/uL (ref 0.1–1.0)
Monocytes Relative: 6 %
Neutro Abs: 5.1 10*3/uL (ref 1.7–7.7)
Neutrophils Relative %: 75 %
Platelets: 557 10*3/uL — ABNORMAL HIGH (ref 150–400)
RBC: 3.65 MIL/uL — ABNORMAL LOW (ref 3.87–5.11)
RDW: 16.8 % — ABNORMAL HIGH (ref 11.5–15.5)
WBC: 6.6 10*3/uL (ref 4.0–10.5)
nRBC: 0 % (ref 0.0–0.2)

## 2020-08-06 LAB — CMP (CANCER CENTER ONLY)
ALT: 17 U/L (ref 0–44)
AST: 24 U/L (ref 15–41)
Albumin: 3.8 g/dL (ref 3.5–5.0)
Alkaline Phosphatase: 71 U/L (ref 38–126)
Anion gap: 9 (ref 5–15)
BUN: 31 mg/dL — ABNORMAL HIGH (ref 8–23)
CO2: 27 mmol/L (ref 22–32)
Calcium: 10.4 mg/dL — ABNORMAL HIGH (ref 8.9–10.3)
Chloride: 104 mmol/L (ref 98–111)
Creatinine: 2.03 mg/dL — ABNORMAL HIGH (ref 0.44–1.00)
GFR, Estimated: 26 mL/min — ABNORMAL LOW (ref >60)
Glucose, Bld: 291 mg/dL — ABNORMAL HIGH (ref 70–99)
Potassium: 4.6 mmol/L (ref 3.5–5.1)
Sodium: 140 mmol/L (ref 135–145)
Total Bilirubin: 0.3 mg/dL (ref 0.3–1.2)
Total Protein: 7.6 g/dL (ref 6.5–8.1)

## 2020-08-06 LAB — RETICULOCYTES
Immature Retic Fract: 17.6 % — ABNORMAL HIGH (ref 2.3–15.9)
RBC.: 3.64 MIL/uL — ABNORMAL LOW (ref 3.87–5.11)
Retic Count, Absolute: 100.5 10*3/uL (ref 19.0–186.0)
Retic Ct Pct: 2.8 % (ref 0.4–3.1)

## 2020-08-11 ENCOUNTER — Other Ambulatory Visit: Payer: Self-pay

## 2020-08-11 ENCOUNTER — Ambulatory Visit: Payer: Medicare Other | Admitting: Family

## 2020-08-11 ENCOUNTER — Encounter: Payer: Self-pay | Admitting: Family

## 2020-08-11 ENCOUNTER — Ambulatory Visit (HOSPITAL_COMMUNITY)
Admission: RE | Admit: 2020-08-11 | Discharge: 2020-08-11 | Disposition: A | Discharge status: Home / Self Care | Payer: Medicare Other | Source: Ambulatory Visit | Attending: Cardiovascular Disease | Admitting: Cardiovascular Disease

## 2020-08-11 VITALS — BP 140/78 | HR 52 | Ht 65.0 in | Wt 159.0 lb

## 2020-08-11 DIAGNOSIS — N1832 Chronic kidney disease, stage 3b: Secondary | ICD-10-CM | POA: Diagnosis not present

## 2020-08-11 DIAGNOSIS — I739 Peripheral vascular disease, unspecified: Secondary | ICD-10-CM | POA: Insufficient documentation

## 2020-08-11 DIAGNOSIS — E118 Type 2 diabetes mellitus with unspecified complications: Secondary | ICD-10-CM

## 2020-08-11 DIAGNOSIS — I5042 Chronic combined systolic (congestive) and diastolic (congestive) heart failure: Secondary | ICD-10-CM | POA: Diagnosis not present

## 2020-08-11 DIAGNOSIS — I255 Ischemic cardiomyopathy: Secondary | ICD-10-CM | POA: Diagnosis not present

## 2020-08-11 DIAGNOSIS — I25118 Atherosclerotic heart disease of native coronary artery with other forms of angina pectoris: Secondary | ICD-10-CM | POA: Diagnosis not present

## 2020-08-11 DIAGNOSIS — E785 Hyperlipidemia, unspecified: Secondary | ICD-10-CM

## 2020-08-11 MED ORDER — METOPROLOL SUCCINATE ER 50 MG PO TB24: 50.0000 mg | ORAL_TABLET | Freq: Every day | ORAL | 1 refills | Status: DC

## 2020-08-11 NOTE — Patient Instructions (Addendum)
Medication Instructions:  Your physician has recommended you make the following change in your medication:   CHANGE Metoprolol Succinate to 50mg  once daily  *If you need a refill on your cardiac medications before your next appointment, please call your pharmacy*  Lab Work: Your physician recommends lab work today: BMP  If you have labs (blood work) drawn today and your tests are completely normal, you will receive your results only by: Marland Kitchen MyChart Message (if you have MyChart) OR . A paper copy in the mail If you have any lab test that is abnormal or we need to change your treatment, we will call you to review the results.   Testing/Procedures: Your EKG today showed normal sinus rhythm.  Follow-Up: At [facility], you and your health needs are our priority.  As part of our continuing mission to provide you with exceptional heart care, we have created designated Provider Care Teams.  These Care Teams include your primary Cardiologist (physician) and Advanced Practice Providers (APPs -  Physician Assistants and Nurse Practitioners) who all work together to provide you with the care you need, when you need it.  We recommend signing up for the patient portal called "MyChart".  Sign up information is provided on this After Visit Summary.  MyChart is used to connect with patients for Virtual Visits (Telemedicine).  Patients are able to view lab/test results, encounter notes, upcoming appointments, etc.  Non-urgent messages can be sent to your provider as well.   To learn more about what you can do with MyChart, go to [email or web address].    Your next appointment:   4 week(s)  The format for your next appointment:   In Person  Provider:   You may see Werner Lean, MD or one of the following Advanced Practice Providers on your designated Care Team:    Melina Copa, PA-C  Ermalinda Barrios, PA-C  Other Instructions Heart Healthy Diet Recommendations: A low-salt diet is  recommended. Meats should be grilled, baked, or boiled. Avoid fried foods. Focus on lean protein sources like fish or chicken with vegetables and fruits. The American Heart Association is a Microbiologist!  American Heart Association Diet and Lifeystyle Recommendations   Exercise recommendations: The American Heart Association recommends 150 minutes of moderate intensity exercise weekly. Try 30 minutes of moderate intensity exercise 4-5 times per week. This could include walking, jogging, or swimming.  Cardiac rehab at [facility] will be contacting your in a few weeks to discuss. We strongly encourage you to participate.
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
## 2020-08-12 ENCOUNTER — Other Ambulatory Visit: Payer: Self-pay

## 2020-08-12 DIAGNOSIS — I25118 Atherosclerotic heart disease of native coronary artery with other forms of angina pectoris: Secondary | ICD-10-CM

## 2020-08-12 LAB — BASIC METABOLIC PANEL
BUN/Creatinine Ratio: 21 (ref 12–28)
BUN: 28 mg/dL — ABNORMAL HIGH (ref 8–27)
CO2: 23 mmol/L (ref 20–29)
Calcium: 11.4 mg/dL — ABNORMAL HIGH (ref 8.7–10.3)
Chloride: 104 mmol/L (ref 96–106)
Creatinine, Ser: 1.33 mg/dL — ABNORMAL HIGH (ref 0.57–1.00)
Glucose: 148 mg/dL — ABNORMAL HIGH (ref 65–99)
Potassium: 4.6 mmol/L (ref 3.5–5.2)
Sodium: 139 mmol/L (ref 134–144)
eGFR: 43 mL/min/{1.73_m2} — ABNORMAL LOW (ref >59)

## 2020-08-13 ENCOUNTER — Encounter (HOSPITAL_COMMUNITY): Payer: Medicare Other

## 2020-08-17 ENCOUNTER — Ambulatory Visit: Payer: Medicare Other | Admitting: Internal Medicine

## 2020-08-17 ENCOUNTER — Telehealth: Payer: Self-pay

## 2020-08-17 DIAGNOSIS — I130 Hypertensive heart and chronic kidney disease with heart failure and stage 1 through stage 4 chronic kidney disease, or unspecified chronic kidney disease: Secondary | ICD-10-CM | POA: Diagnosis not present

## 2020-08-17 DIAGNOSIS — I70209 Unspecified atherosclerosis of native arteries of extremities, unspecified extremity: Secondary | ICD-10-CM

## 2020-08-17 DIAGNOSIS — N1831 Chronic kidney disease, stage 3a: Secondary | ICD-10-CM | POA: Diagnosis not present

## 2020-08-17 DIAGNOSIS — I5033 Acute on chronic diastolic (congestive) heart failure: Secondary | ICD-10-CM | POA: Diagnosis not present

## 2020-08-17 DIAGNOSIS — I739 Peripheral vascular disease, unspecified: Secondary | ICD-10-CM

## 2020-08-17 DIAGNOSIS — E1151 Type 2 diabetes mellitus with diabetic peripheral angiopathy without gangrene: Secondary | ICD-10-CM | POA: Diagnosis not present

## 2020-08-17 NOTE — Telephone Encounter (Signed)
-----   Message from Werner Lean, MD sent at 08/15/2020 10:52 AM EDT ----- Results: SFA blockage Plan: PAD referral to [place] Dragon, MD

## 2020-08-17 NOTE — Telephone Encounter (Signed)
Spoke with patient and spouse notified of results and MD recommendations. Pt ans spouse are agreeable to plan.

## 2020-08-18 ENCOUNTER — Other Ambulatory Visit: Payer: Self-pay | Admitting: Internal Medicine

## 2020-08-18 DIAGNOSIS — I129 Hypertensive chronic kidney disease with stage 1 through stage 4 chronic kidney disease, or unspecified chronic kidney disease: Secondary | ICD-10-CM | POA: Diagnosis not present

## 2020-08-18 DIAGNOSIS — R339 Retention of urine, unspecified: Secondary | ICD-10-CM | POA: Diagnosis not present

## 2020-08-18 DIAGNOSIS — N1831 Chronic kidney disease, stage 3a: Secondary | ICD-10-CM | POA: Diagnosis not present

## 2020-08-18 DIAGNOSIS — D631 Anemia in chronic kidney disease: Secondary | ICD-10-CM | POA: Diagnosis not present

## 2020-08-18 DIAGNOSIS — R809 Proteinuria, unspecified: Secondary | ICD-10-CM | POA: Diagnosis not present

## 2020-08-18 DIAGNOSIS — E1122 Type 2 diabetes mellitus with diabetic chronic kidney disease: Secondary | ICD-10-CM | POA: Diagnosis not present

## 2020-08-19 ENCOUNTER — Other Ambulatory Visit: Payer: Medicare Other

## 2020-08-20 ENCOUNTER — Other Ambulatory Visit: Payer: Medicare Other

## 2020-08-20 ENCOUNTER — Ambulatory Visit
Admission: RE | Admit: 2020-08-20 | Discharge: 2020-08-20 | Disposition: A | Discharge status: Home / Self Care | Payer: Medicare Other | Source: Ambulatory Visit | Attending: Internal Medicine | Admitting: Internal Medicine

## 2020-08-20 DIAGNOSIS — R339 Retention of urine, unspecified: Secondary | ICD-10-CM | POA: Diagnosis not present

## 2020-08-20 DIAGNOSIS — N2889 Other specified disorders of kidney and ureter: Secondary | ICD-10-CM | POA: Diagnosis not present

## 2020-08-23 ENCOUNTER — Other Ambulatory Visit: Payer: Self-pay | Admitting: Internal Medicine

## 2020-08-23 DIAGNOSIS — N1831 Chronic kidney disease, stage 3a: Secondary | ICD-10-CM

## 2020-08-24 ENCOUNTER — Telehealth: Payer: Self-pay | Admitting: Internal Medicine

## 2020-08-24 NOTE — Telephone Encounter (Signed)
Spoke with pt husband in regards to July appointments.  I informed him that OV for 09/22/20 is with Dr. Gasper Sells for check up.  OV 09/24/20 is with Dr. Gwenlyn Found who will assess her SFA blockage.  He is aware that pt will be seen for 2 different reasons.  I was unable to find an appointment with Dr. Gasper Sells for 09/24/20 so that pt would not have to commute twice.  Husband expressed that he will keep appointments as they are now.

## 2020-08-24 NOTE — Telephone Encounter (Signed)
Husband of patient called. He wanted to know if both upcoming office visits the patient is scheduled for are necessary. The husband has to drive the patient and he needs to take time off work.   Please assist

## 2020-08-30 ENCOUNTER — Other Ambulatory Visit: Payer: Self-pay | Admitting: Hematology

## 2020-09-03 ENCOUNTER — Encounter (HOSPITAL_COMMUNITY): Payer: Self-pay | Admitting: Emergency Medicine

## 2020-09-03 ENCOUNTER — Emergency Department (HOSPITAL_COMMUNITY)
Admission: EM | Admit: 2020-09-03 | Discharge: 2020-09-03 | Disposition: A | Discharge status: Home / Self Care | Payer: Medicare Other | Attending: Emergency Medicine | Admitting: Emergency Medicine

## 2020-09-03 DIAGNOSIS — I10 Essential (primary) hypertension: Secondary | ICD-10-CM | POA: Diagnosis not present

## 2020-09-03 DIAGNOSIS — Z87891 Personal history of nicotine dependence: Secondary | ICD-10-CM | POA: Insufficient documentation

## 2020-09-03 DIAGNOSIS — Z79899 Other long term (current) drug therapy: Secondary | ICD-10-CM | POA: Diagnosis not present

## 2020-09-03 DIAGNOSIS — Z955 Presence of coronary angioplasty implant and graft: Secondary | ICD-10-CM | POA: Insufficient documentation

## 2020-09-03 DIAGNOSIS — N189 Chronic kidney disease, unspecified: Secondary | ICD-10-CM | POA: Insufficient documentation

## 2020-09-03 DIAGNOSIS — R350 Frequency of micturition: Secondary | ICD-10-CM | POA: Insufficient documentation

## 2020-09-03 DIAGNOSIS — G8929 Other chronic pain: Secondary | ICD-10-CM | POA: Insufficient documentation

## 2020-09-03 DIAGNOSIS — I25119 Atherosclerotic heart disease of native coronary artery with unspecified angina pectoris: Secondary | ICD-10-CM | POA: Diagnosis not present

## 2020-09-03 DIAGNOSIS — M5441 Lumbago with sciatica, right side: Secondary | ICD-10-CM | POA: Insufficient documentation

## 2020-09-03 DIAGNOSIS — E1165 Type 2 diabetes mellitus with hyperglycemia: Secondary | ICD-10-CM | POA: Insufficient documentation

## 2020-09-03 DIAGNOSIS — M549 Dorsalgia, unspecified: Secondary | ICD-10-CM | POA: Diagnosis not present

## 2020-09-03 DIAGNOSIS — I5041 Acute combined systolic (congestive) and diastolic (congestive) heart failure: Secondary | ICD-10-CM | POA: Insufficient documentation

## 2020-09-03 DIAGNOSIS — Z7982 Long term (current) use of aspirin: Secondary | ICD-10-CM | POA: Insufficient documentation

## 2020-09-03 DIAGNOSIS — I130 Hypertensive heart and chronic kidney disease with heart failure and stage 1 through stage 4 chronic kidney disease, or unspecified chronic kidney disease: Secondary | ICD-10-CM | POA: Insufficient documentation

## 2020-09-03 LAB — CBC WITH DIFFERENTIAL/PLATELET
Abs Immature Granulocytes: 0.03 10*3/uL (ref 0.00–0.07)
Basophils Absolute: 0.1 10*3/uL (ref 0.0–0.1)
Basophils Relative: 1 %
Eosinophils Absolute: 0 10*3/uL (ref 0.0–0.5)
Eosinophils Relative: 0 %
HCT: 33.8 % — ABNORMAL LOW (ref 36.0–46.0)
Hemoglobin: 10.9 g/dL — ABNORMAL LOW (ref 12.0–15.0)
Immature Granulocytes: 1 %
Lymphocytes Relative: 16 %
Lymphs Abs: 1 10*3/uL (ref 0.7–4.0)
MCH: 32.6 pg (ref 26.0–34.0)
MCHC: 32.2 g/dL (ref 30.0–36.0)
MCV: 101.2 fL — ABNORMAL HIGH (ref 80.0–100.0)
Monocytes Absolute: 0.5 10*3/uL (ref 0.1–1.0)
Monocytes Relative: 8 %
Neutro Abs: 4.7 10*3/uL (ref 1.7–7.7)
Neutrophils Relative %: 74 %
Platelets: 476 10*3/uL — ABNORMAL HIGH (ref 150–400)
RBC: 3.34 MIL/uL — ABNORMAL LOW (ref 3.87–5.11)
RDW: 16 % — ABNORMAL HIGH (ref 11.5–15.5)
WBC: 6.3 10*3/uL (ref 4.0–10.5)
nRBC: 0 % (ref 0.0–0.2)

## 2020-09-03 LAB — BASIC METABOLIC PANEL
Anion gap: 8 (ref 5–15)
BUN: 26 mg/dL — ABNORMAL HIGH (ref 8–23)
CO2: 27 mmol/L (ref 22–32)
Calcium: 10.2 mg/dL (ref 8.9–10.3)
Chloride: 102 mmol/L (ref 98–111)
Creatinine, Ser: 1.45 mg/dL — ABNORMAL HIGH (ref 0.44–1.00)
GFR, Estimated: 38 mL/min — ABNORMAL LOW (ref >60)
Glucose, Bld: 149 mg/dL — ABNORMAL HIGH (ref 70–99)
Potassium: 3.9 mmol/L (ref 3.5–5.1)
Sodium: 137 mmol/L (ref 135–145)

## 2020-09-03 LAB — URINALYSIS, ROUTINE W REFLEX MICROSCOPIC
Bilirubin Urine: NEGATIVE
Glucose, UA: 50 mg/dL — AB
Hgb urine dipstick: NEGATIVE
Ketones, ur: NEGATIVE mg/dL
Leukocytes,Ua: NEGATIVE
Nitrite: NEGATIVE
Protein, ur: NEGATIVE mg/dL
Specific Gravity, Urine: 1.015 (ref 1.005–1.030)
pH: 6 (ref 5.0–8.0)

## 2020-09-03 MED ORDER — SODIUM CHLORIDE 0.9 % IV BOLUS
500.0000 mL | Freq: Once | INTRAVENOUS | Status: AC
  Administered 2020-09-03: 500 mL via INTRAVENOUS

## 2020-09-03 MED ORDER — ACETAMINOPHEN 325 MG PO TABS
650.0000 mg | ORAL_TABLET | Freq: Once | ORAL | Status: AC
  Administered 2020-09-03: 650 mg via ORAL
  Filled 2020-09-03: qty 2

## 2020-09-03 MED ORDER — LIDOCAINE 5 % EX PTCH: 1.0000 | MEDICATED_PATCH | CUTANEOUS | 0 refills | Status: DC

## 2020-09-03 MED ORDER — LIDOCAINE 5 % EX PTCH
1.0000 | MEDICATED_PATCH | CUTANEOUS | Status: DC
  Administered 2020-09-03: 1 via TRANSDERMAL
  Filled 2020-09-03: qty 1
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
## 2020-09-03 NOTE — Discharge Instructions (Addendum)
Follow-up with your primary care doctor for ongoing management of your symptoms.  Return here as needed if you have any worsening symptoms.

## 2020-09-03 NOTE — ED Triage Notes (Signed)
Patient BIB [facility] from home for evaluation of urinary frequency. History of hypertension, reports taking antihypertensive medications this morning, BP 188/88 at this time. Patient alert, oriented, and in no apparent distress at this time.

## 2020-09-04 LAB — URINE CULTURE: Culture: <10000 — AB

## 2020-09-06 ENCOUNTER — Other Ambulatory Visit: Payer: Self-pay

## 2020-09-06 MED ORDER — TICAGRELOR 90 MG PO TABS: 90.0000 mg | ORAL_TABLET | Freq: Two times a day (BID) | ORAL | 2 refills | Status: DC

## 2020-09-06 NOTE — Telephone Encounter (Signed)
Received a voicemail from the refill line where the patients spouse has left our office a message requesting a refill on Brilinta.   Spoke with patients spouse, per DPR, to verify if he wanted a ninety day supply or thirty day supply. He voiced understanding.   He requested a ninety day supply.   Rx(s) sent to pharmacy electronically.

## 2020-09-07 ENCOUNTER — Emergency Department (HOSPITAL_COMMUNITY): Payer: Medicare Other

## 2020-09-07 ENCOUNTER — Emergency Department (HOSPITAL_COMMUNITY)
Admission: EM | Admit: 2020-09-07 | Discharge: 2020-09-07 | Disposition: A | Discharge status: Home / Self Care | Payer: Medicare Other | Attending: Emergency Medicine | Admitting: Emergency Medicine

## 2020-09-07 DIAGNOSIS — I130 Hypertensive heart and chronic kidney disease with heart failure and stage 1 through stage 4 chronic kidney disease, or unspecified chronic kidney disease: Secondary | ICD-10-CM | POA: Diagnosis not present

## 2020-09-07 DIAGNOSIS — M5416 Radiculopathy, lumbar region: Secondary | ICD-10-CM

## 2020-09-07 DIAGNOSIS — E1122 Type 2 diabetes mellitus with diabetic chronic kidney disease: Secondary | ICD-10-CM | POA: Insufficient documentation

## 2020-09-07 DIAGNOSIS — I25119 Atherosclerotic heart disease of native coronary artery with unspecified angina pectoris: Secondary | ICD-10-CM | POA: Diagnosis not present

## 2020-09-07 DIAGNOSIS — G8929 Other chronic pain: Secondary | ICD-10-CM | POA: Insufficient documentation

## 2020-09-07 DIAGNOSIS — Z87891 Personal history of nicotine dependence: Secondary | ICD-10-CM | POA: Diagnosis not present

## 2020-09-07 DIAGNOSIS — I10 Essential (primary) hypertension: Secondary | ICD-10-CM | POA: Diagnosis not present

## 2020-09-07 DIAGNOSIS — I5041 Acute combined systolic (congestive) and diastolic (congestive) heart failure: Secondary | ICD-10-CM | POA: Diagnosis not present

## 2020-09-07 DIAGNOSIS — N189 Chronic kidney disease, unspecified: Secondary | ICD-10-CM | POA: Diagnosis not present

## 2020-09-07 DIAGNOSIS — R0602 Shortness of breath: Secondary | ICD-10-CM | POA: Diagnosis not present

## 2020-09-07 DIAGNOSIS — Z79899 Other long term (current) drug therapy: Secondary | ICD-10-CM | POA: Insufficient documentation

## 2020-09-07 DIAGNOSIS — R079 Chest pain, unspecified: Secondary | ICD-10-CM | POA: Diagnosis not present

## 2020-09-07 DIAGNOSIS — R0789 Other chest pain: Secondary | ICD-10-CM | POA: Diagnosis not present

## 2020-09-07 DIAGNOSIS — Z7982 Long term (current) use of aspirin: Secondary | ICD-10-CM | POA: Diagnosis not present

## 2020-09-07 DIAGNOSIS — M545 Low back pain, unspecified: Secondary | ICD-10-CM | POA: Diagnosis not present

## 2020-09-07 LAB — TROPONIN I (HIGH SENSITIVITY)
Troponin I (High Sensitivity): 8 ng/L (ref <18)
Troponin I (High Sensitivity): 7 ng/L (ref <18)

## 2020-09-07 LAB — BASIC METABOLIC PANEL
Anion gap: 8 (ref 5–15)
BUN: 27 mg/dL — ABNORMAL HIGH (ref 8–23)
CO2: 23 mmol/L (ref 22–32)
Calcium: 9.9 mg/dL (ref 8.9–10.3)
Chloride: 104 mmol/L (ref 98–111)
Creatinine, Ser: 1.34 mg/dL — ABNORMAL HIGH (ref 0.44–1.00)
GFR, Estimated: 42 mL/min — ABNORMAL LOW (ref >60)
Glucose, Bld: 121 mg/dL — ABNORMAL HIGH (ref 70–99)
Potassium: 4.4 mmol/L (ref 3.5–5.1)
Sodium: 135 mmol/L (ref 135–145)

## 2020-09-07 LAB — CBC
HCT: 34.6 % — ABNORMAL LOW (ref 36.0–46.0)
Hemoglobin: 11 g/dL — ABNORMAL LOW (ref 12.0–15.0)
MCH: 32.3 pg (ref 26.0–34.0)
MCHC: 31.8 g/dL (ref 30.0–36.0)
MCV: 101.5 fL — ABNORMAL HIGH (ref 80.0–100.0)
Platelets: 509 10*3/uL — ABNORMAL HIGH (ref 150–400)
RBC: 3.41 MIL/uL — ABNORMAL LOW (ref 3.87–5.11)
RDW: 15.7 % — ABNORMAL HIGH (ref 11.5–15.5)
WBC: 5 10*3/uL (ref 4.0–10.5)
nRBC: 0 % (ref 0.0–0.2)

## 2020-09-07 LAB — CBG MONITORING, ED: Glucose-Capillary: 121 mg/dL — ABNORMAL HIGH (ref 70–99)

## 2020-09-07 MED ORDER — LIDOCAINE 5 % EX PTCH
1.0000 | MEDICATED_PATCH | CUTANEOUS | Status: DC
  Administered 2020-09-07: 1 via TRANSDERMAL
  Filled 2020-09-07: qty 1

## 2020-09-07 MED ORDER — HYDROCODONE-ACETAMINOPHEN 5-325 MG PO TABS
1.0000 | ORAL_TABLET | Freq: Once | ORAL | Status: AC
  Administered 2020-09-07: 1 via ORAL
  Filled 2020-09-07: qty 1

## 2020-09-07 MED ORDER — LIDOCAINE 5 % EX PTCH: 1.0000 | MEDICATED_PATCH | CUTANEOUS | 0 refills | Status: DC

## 2020-09-07 NOTE — ED Provider Notes (Signed)
Emergency Medicine Provider Triage Evaluation Note  Anna [name] , a 70 y.o. female  was evaluated in triage.  Pt complains of chest pain that started on the right side and then to her left when she was bending forward this morning.  No associated shortness of breath or palpitations.  History of  cardiomyopathy, on Brilinta and aspirin.  Review of Systems  Positive: Chest pain that does not radiate, nonexertional in nature Negative: Shortness of breath, palpitations, nausea, vomiting  Physical Exam  BP (!) 203/76 (BP Location: Left Arm)   Pulse (!) 54   Temp 98 F (36.7 C) (Oral)   Resp 16   SpO2 100%  Gen:   Awake, no distress   Resp:  Normal effort  MSK:   Moves extremities without difficulty  Other:  RRR, no M/R/G.  1+ bilateral lower extremity edema.  Lungs CTA B.  Medical Decision Making  Medically screening exam initiated at 1:40 PM.  Appropriate orders placed.  Anna [name] was informed that the remainder of the evaluation will be completed by another provider, this initial triage assessment does not replace that evaluation, and the importance of remaining in the ED until their evaluation is complete.  This chart was dictated using voice recognition software, Dragon. Despite the best efforts of this provider to proofread and correct errors, errors may still occur which can change documentation meaning.    Aura Dials 09/07/20 1348    Noemi Chapel, MD 09/08/20 1036

## 2020-09-07 NOTE — ED Provider Notes (Signed)
I provided a substantive portion of the care of this patient.  I personally performed the entirety of the medical decision making for this encounter.  EKG Interpretation  Date/Time:  Tuesday September 07 2020 13:42:42 EDT Ventricular Rate:  56 PR Interval:  150 QRS Duration: 80 QT Interval:  428 QTC Calculation: 413 R Axis:   -2 Text Interpretation: Sinus bradycardia Nonspecific T wave abnormality Abnormal ECG Confirmed by Lacretia Leigh ([id number]) on 09/07/2020 7:38:54 PM   70 year old female presents with sharp chest pain on the right side which got worse when she bent over.  Troponins here are negative.  EKG without ischemic changes.  Will discharge home   Lacretia Leigh, MD 09/07/20 [phone]

## 2020-09-07 NOTE — ED Triage Notes (Signed)
Pt with generalized chest pain with movement and palpation only since Friday. Seen for same here already. Pt pain free on EMS arrival. Hx chronic lower back pain and would like that addressed. Pt took 324 ASA before EMS arrival.

## 2020-09-07 NOTE — ED Notes (Signed)
Pt's IV came out. Pt stated she "looked down and was bleeding"

## 2020-09-07 NOTE — Discharge Instructions (Signed)
Please call you cardiologist tomorrow to schedule close follow up appointment regarding your visit today.  Please call your primary care and neurosurgeon for management of your chronic low back pain. Return to the ED if you develop worsening chest pain.
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
## 2020-09-22 ENCOUNTER — Inpatient Hospital Stay (HOSPITAL_COMMUNITY)
Admission: EM | Admit: 2020-09-22 | Discharge: 2020-09-25 | DRG: 563 | Disposition: A | Discharge status: Home Health | Payer: Medicare Other | Attending: Family Medicine | Admitting: Family Medicine

## 2020-09-22 ENCOUNTER — Emergency Department (HOSPITAL_COMMUNITY): Payer: Medicare Other

## 2020-09-22 ENCOUNTER — Ambulatory Visit: Payer: Medicare Other | Admitting: Internal Medicine

## 2020-09-22 ENCOUNTER — Other Ambulatory Visit: Payer: Self-pay

## 2020-09-22 DIAGNOSIS — E119 Type 2 diabetes mellitus without complications: Secondary | ICD-10-CM | POA: Diagnosis not present

## 2020-09-22 DIAGNOSIS — N1831 Chronic kidney disease, stage 3a: Secondary | ICD-10-CM | POA: Diagnosis not present

## 2020-09-22 DIAGNOSIS — Z043 Encounter for examination and observation following other accident: Secondary | ICD-10-CM | POA: Diagnosis not present

## 2020-09-22 DIAGNOSIS — I5042 Chronic combined systolic (congestive) and diastolic (congestive) heart failure: Secondary | ICD-10-CM | POA: Diagnosis not present

## 2020-09-22 DIAGNOSIS — G2581 Restless legs syndrome: Secondary | ICD-10-CM | POA: Diagnosis not present

## 2020-09-22 DIAGNOSIS — D75838 Other thrombocytosis: Secondary | ICD-10-CM | POA: Diagnosis not present

## 2020-09-22 DIAGNOSIS — I10 Essential (primary) hypertension: Secondary | ICD-10-CM | POA: Diagnosis present

## 2020-09-22 DIAGNOSIS — E785 Hyperlipidemia, unspecified: Secondary | ICD-10-CM | POA: Diagnosis not present

## 2020-09-22 DIAGNOSIS — S42321A Displaced transverse fracture of shaft of humerus, right arm, initial encounter for closed fracture: Secondary | ICD-10-CM | POA: Diagnosis not present

## 2020-09-22 DIAGNOSIS — E1142 Type 2 diabetes mellitus with diabetic polyneuropathy: Secondary | ICD-10-CM | POA: Diagnosis not present

## 2020-09-22 DIAGNOSIS — Z79899 Other long term (current) drug therapy: Secondary | ICD-10-CM | POA: Diagnosis not present

## 2020-09-22 DIAGNOSIS — Z20822 Contact with and (suspected) exposure to covid-19: Secondary | ICD-10-CM | POA: Diagnosis not present

## 2020-09-22 DIAGNOSIS — I2510 Atherosclerotic heart disease of native coronary artery without angina pectoris: Secondary | ICD-10-CM | POA: Diagnosis present

## 2020-09-22 DIAGNOSIS — I130 Hypertensive heart and chronic kidney disease with heart failure and stage 1 through stage 4 chronic kidney disease, or unspecified chronic kidney disease: Secondary | ICD-10-CM | POA: Diagnosis present

## 2020-09-22 DIAGNOSIS — M25561 Pain in right knee: Secondary | ICD-10-CM | POA: Diagnosis not present

## 2020-09-22 DIAGNOSIS — E1122 Type 2 diabetes mellitus with diabetic chronic kidney disease: Secondary | ICD-10-CM | POA: Diagnosis present

## 2020-09-22 DIAGNOSIS — M79621 Pain in right upper arm: Secondary | ICD-10-CM | POA: Diagnosis not present

## 2020-09-22 DIAGNOSIS — M79603 Pain in arm, unspecified: Secondary | ICD-10-CM | POA: Diagnosis not present

## 2020-09-22 DIAGNOSIS — J9811 Atelectasis: Secondary | ICD-10-CM | POA: Diagnosis not present

## 2020-09-22 DIAGNOSIS — M47812 Spondylosis without myelopathy or radiculopathy, cervical region: Secondary | ICD-10-CM | POA: Diagnosis not present

## 2020-09-22 DIAGNOSIS — Z7982 Long term (current) use of aspirin: Secondary | ICD-10-CM

## 2020-09-22 DIAGNOSIS — Z7984 Long term (current) use of oral hypoglycemic drugs: Secondary | ICD-10-CM | POA: Diagnosis not present

## 2020-09-22 DIAGNOSIS — M549 Dorsalgia, unspecified: Secondary | ICD-10-CM | POA: Diagnosis present

## 2020-09-22 DIAGNOSIS — Y92008 Other place in unspecified non-institutional (private) residence as the place of occurrence of the external cause: Secondary | ICD-10-CM | POA: Diagnosis not present

## 2020-09-22 DIAGNOSIS — S0990XA Unspecified injury of head, initial encounter: Secondary | ICD-10-CM | POA: Diagnosis not present

## 2020-09-22 DIAGNOSIS — M7989 Other specified soft tissue disorders: Secondary | ICD-10-CM | POA: Diagnosis not present

## 2020-09-22 DIAGNOSIS — N1832 Chronic kidney disease, stage 3b: Secondary | ICD-10-CM | POA: Diagnosis not present

## 2020-09-22 DIAGNOSIS — N179 Acute kidney failure, unspecified: Secondary | ICD-10-CM | POA: Diagnosis not present

## 2020-09-22 DIAGNOSIS — R0902 Hypoxemia: Secondary | ICD-10-CM | POA: Diagnosis not present

## 2020-09-22 DIAGNOSIS — M19011 Primary osteoarthritis, right shoulder: Secondary | ICD-10-CM | POA: Diagnosis not present

## 2020-09-22 DIAGNOSIS — N183 Chronic kidney disease, stage 3 unspecified: Secondary | ICD-10-CM | POA: Diagnosis present

## 2020-09-22 DIAGNOSIS — I517 Cardiomegaly: Secondary | ICD-10-CM | POA: Diagnosis not present

## 2020-09-22 DIAGNOSIS — S42351A Displaced comminuted fracture of shaft of humerus, right arm, initial encounter for closed fracture: Secondary | ICD-10-CM | POA: Diagnosis not present

## 2020-09-22 DIAGNOSIS — W010XXA Fall on same level from slipping, tripping and stumbling without subsequent striking against object, initial encounter: Secondary | ICD-10-CM | POA: Diagnosis present

## 2020-09-22 DIAGNOSIS — M19021 Primary osteoarthritis, right elbow: Secondary | ICD-10-CM | POA: Diagnosis not present

## 2020-09-22 DIAGNOSIS — M25562 Pain in left knee: Secondary | ICD-10-CM | POA: Diagnosis present

## 2020-09-22 DIAGNOSIS — Z7902 Long term (current) use of antithrombotics/antiplatelets: Secondary | ICD-10-CM | POA: Diagnosis not present

## 2020-09-22 DIAGNOSIS — Z955 Presence of coronary angioplasty implant and graft: Secondary | ICD-10-CM

## 2020-09-22 DIAGNOSIS — S42301A Unspecified fracture of shaft of humerus, right arm, initial encounter for closed fracture: Secondary | ICD-10-CM | POA: Diagnosis present

## 2020-09-22 DIAGNOSIS — W19XXXA Unspecified fall, initial encounter: Secondary | ICD-10-CM

## 2020-09-22 DIAGNOSIS — Z794 Long term (current) use of insulin: Secondary | ICD-10-CM | POA: Diagnosis not present

## 2020-09-22 DIAGNOSIS — I5041 Acute combined systolic (congestive) and diastolic (congestive) heart failure: Secondary | ICD-10-CM | POA: Insufficient documentation

## 2020-09-22 DIAGNOSIS — G8929 Other chronic pain: Secondary | ICD-10-CM | POA: Diagnosis present

## 2020-09-22 DIAGNOSIS — R29898 Other symptoms and signs involving the musculoskeletal system: Secondary | ICD-10-CM | POA: Diagnosis not present

## 2020-09-22 DIAGNOSIS — S42331A Displaced oblique fracture of shaft of humerus, right arm, initial encounter for closed fracture: Secondary | ICD-10-CM | POA: Diagnosis not present

## 2020-09-22 DIAGNOSIS — R5381 Other malaise: Secondary | ICD-10-CM | POA: Diagnosis not present

## 2020-09-22 DIAGNOSIS — S4991XA Unspecified injury of right shoulder and upper arm, initial encounter: Secondary | ICD-10-CM | POA: Diagnosis not present

## 2020-09-22 DIAGNOSIS — R531 Weakness: Secondary | ICD-10-CM | POA: Diagnosis not present

## 2020-09-22 DIAGNOSIS — Z7401 Bed confinement status: Secondary | ICD-10-CM | POA: Diagnosis not present

## 2020-09-22 DIAGNOSIS — D75839 Thrombocytosis, unspecified: Secondary | ICD-10-CM | POA: Diagnosis present

## 2020-09-22 LAB — COMPREHENSIVE METABOLIC PANEL
ALT: 24 U/L (ref 0–44)
AST: 30 U/L (ref 15–41)
Albumin: 2.9 g/dL — ABNORMAL LOW (ref 3.5–5.0)
Alkaline Phosphatase: 72 U/L (ref 38–126)
Anion gap: 5 (ref 5–15)
BUN: 25 mg/dL — ABNORMAL HIGH (ref 8–23)
CO2: 28 mmol/L (ref 22–32)
Calcium: 9.5 mg/dL (ref 8.9–10.3)
Chloride: 106 mmol/L (ref 98–111)
Creatinine, Ser: 1.31 mg/dL — ABNORMAL HIGH (ref 0.44–1.00)
GFR, Estimated: 43 mL/min — ABNORMAL LOW (ref >60)
Glucose, Bld: 110 mg/dL — ABNORMAL HIGH (ref 70–99)
Potassium: 3.8 mmol/L (ref 3.5–5.1)
Sodium: 139 mmol/L (ref 135–145)
Total Bilirubin: 0.6 mg/dL (ref 0.3–1.2)
Total Protein: 6.1 g/dL — ABNORMAL LOW (ref 6.5–8.1)

## 2020-09-22 LAB — RESP PANEL BY RT-PCR (FLU A&B, COVID) ARPGX2
Influenza A by PCR: NEGATIVE
Influenza B by PCR: NEGATIVE
SARS Coronavirus 2 by RT PCR: NEGATIVE

## 2020-09-22 LAB — CBC WITH DIFFERENTIAL/PLATELET
Abs Immature Granulocytes: 0.04 10*3/uL (ref 0.00–0.07)
Basophils Absolute: 0.1 10*3/uL (ref 0.0–0.1)
Basophils Relative: 1 %
Eosinophils Absolute: 0.1 10*3/uL (ref 0.0–0.5)
Eosinophils Relative: 2 %
HCT: 34.2 % — ABNORMAL LOW (ref 36.0–46.0)
Hemoglobin: 11 g/dL — ABNORMAL LOW (ref 12.0–15.0)
Immature Granulocytes: 1 %
Lymphocytes Relative: 22 %
Lymphs Abs: 1.2 10*3/uL (ref 0.7–4.0)
MCH: 32.4 pg (ref 26.0–34.0)
MCHC: 32.2 g/dL (ref 30.0–36.0)
MCV: 100.6 fL — ABNORMAL HIGH (ref 80.0–100.0)
Monocytes Absolute: 0.4 10*3/uL (ref 0.1–1.0)
Monocytes Relative: 8 %
Neutro Abs: 3.6 10*3/uL (ref 1.7–7.7)
Neutrophils Relative %: 66 %
Platelets: 408 10*3/uL — ABNORMAL HIGH (ref 150–400)
RBC: 3.4 MIL/uL — ABNORMAL LOW (ref 3.87–5.11)
RDW: 15.6 % — ABNORMAL HIGH (ref 11.5–15.5)
WBC: 5.4 10*3/uL (ref 4.0–10.5)
nRBC: 0 % (ref 0.0–0.2)

## 2020-09-22 LAB — PROTIME-INR
INR: 1 (ref 0.8–1.2)
Prothrombin Time: 13.3 seconds (ref 11.4–15.2)

## 2020-09-22 LAB — CREATININE, SERUM
Creatinine, Ser: 2.08 mg/dL — ABNORMAL HIGH (ref 0.44–1.00)
GFR, Estimated: 25 mL/min — ABNORMAL LOW (ref >60)

## 2020-09-22 LAB — HEMOGLOBIN A1C
Hgb A1c MFr Bld: 7.4 % — ABNORMAL HIGH (ref 4.8–5.6)
Mean Plasma Glucose: 165.68 mg/dL

## 2020-09-22 LAB — CBG MONITORING, ED: Glucose-Capillary: 120 mg/dL — ABNORMAL HIGH (ref 70–99)

## 2020-09-22 LAB — GLUCOSE, CAPILLARY: Glucose-Capillary: 137 mg/dL — ABNORMAL HIGH (ref 70–99)

## 2020-09-22 MED ORDER — HYDROXYUREA 500 MG PO CAPS
500.0000 mg | ORAL_CAPSULE | Freq: Every day | ORAL | Status: DC
  Administered 2020-09-23 – 2020-09-25 (×3): 500 mg via ORAL
  Filled 2020-09-22 (×3): qty 1

## 2020-09-22 MED ORDER — MORPHINE SULFATE (PF) 4 MG/ML IV SOLN
4.0000 mg | Freq: Once | INTRAVENOUS | Status: AC
  Administered 2020-09-22: 4 mg via INTRAVENOUS
  Filled 2020-09-22: qty 1

## 2020-09-22 MED ORDER — MORPHINE SULFATE (PF) 2 MG/ML IV SOLN
1.0000 mg | INTRAVENOUS | Status: DC | PRN
  Administered 2020-09-22 – 2020-09-23 (×2): 1 mg via INTRAVENOUS
  Filled 2020-09-22 (×2): qty 1

## 2020-09-22 MED ORDER — SODIUM CHLORIDE 0.9% FLUSH
3.0000 mL | Freq: Two times a day (BID) | INTRAVENOUS | Status: DC
  Administered 2020-09-23 – 2020-09-24 (×3): 3 mL via INTRAVENOUS

## 2020-09-22 MED ORDER — SODIUM CHLORIDE 0.9% FLUSH: 3.0000 mL | INTRAVENOUS | Status: DC | PRN

## 2020-09-22 MED ORDER — MORPHINE SULFATE (PF) 4 MG/ML IV SOLN
4.0000 mg | Freq: Once | INTRAVENOUS | Status: DC
  Filled 2020-09-22: qty 1

## 2020-09-22 MED ORDER — INSULIN GLARGINE 100 UNIT/ML ~~LOC~~ SOLN
15.0000 [IU] | Freq: Every day | SUBCUTANEOUS | Status: DC
  Administered 2020-09-23 – 2020-09-25 (×3): 15 [IU] via SUBCUTANEOUS
  Filled 2020-09-22 (×3): qty 0.15

## 2020-09-22 MED ORDER — MAGNESIUM OXIDE -MG SUPPLEMENT 400 (240 MG) MG PO TABS
200.0000 mg | ORAL_TABLET | Freq: Every day | ORAL | Status: DC
  Administered 2020-09-23 – 2020-09-25 (×3): 200 mg via ORAL
  Filled 2020-09-22 (×3): qty 1

## 2020-09-22 MED ORDER — ACETAMINOPHEN 325 MG PO TABS
650.0000 mg | ORAL_TABLET | Freq: Four times a day (QID) | ORAL | Status: DC | PRN
  Administered 2020-09-24 – 2020-09-25 (×2): 650 mg via ORAL
  Filled 2020-09-22 (×2): qty 2

## 2020-09-22 MED ORDER — HEPARIN SODIUM (PORCINE) 5000 UNIT/ML IJ SOLN: 5000.0000 [IU] | Freq: Three times a day (TID) | INTRAMUSCULAR | Status: DC

## 2020-09-22 MED ORDER — FENTANYL CITRATE (PF) 100 MCG/2ML IJ SOLN
50.0000 ug | Freq: Once | INTRAMUSCULAR | Status: AC
  Administered 2020-09-22: 50 ug via INTRAVENOUS
  Filled 2020-09-22: qty 2

## 2020-09-22 MED ORDER — ENOXAPARIN SODIUM 40 MG/0.4ML IJ SOSY
40.0000 mg | PREFILLED_SYRINGE | INTRAMUSCULAR | Status: DC
  Administered 2020-09-23 – 2020-09-25 (×3): 40 mg via SUBCUTANEOUS
  Filled 2020-09-22 (×3): qty 0.4

## 2020-09-22 MED ORDER — METOPROLOL SUCCINATE ER 25 MG PO TB24
50.0000 mg | ORAL_TABLET | Freq: Every day | ORAL | Status: DC
  Filled 2020-09-22: qty 2

## 2020-09-22 MED ORDER — ASPIRIN EC 81 MG PO TBEC
81.0000 mg | DELAYED_RELEASE_TABLET | Freq: Every day | ORAL | Status: DC
  Administered 2020-09-23 – 2020-09-25 (×3): 81 mg via ORAL
  Filled 2020-09-22 (×3): qty 1

## 2020-09-22 MED ORDER — ROPINIROLE HCL 0.5 MG PO TABS
0.5000 mg | ORAL_TABLET | Freq: Two times a day (BID) | ORAL | Status: DC
  Administered 2020-09-22 – 2020-09-25 (×6): 0.5 mg via ORAL
  Filled 2020-09-22 (×6): qty 1

## 2020-09-22 MED ORDER — ACETAMINOPHEN 650 MG RE SUPP: 650.0000 mg | Freq: Four times a day (QID) | RECTAL | Status: DC | PRN

## 2020-09-22 MED ORDER — FENOFIBRATE 160 MG PO TABS
160.0000 mg | ORAL_TABLET | Freq: Every day | ORAL | Status: DC
  Administered 2020-09-23 – 2020-09-25 (×3): 160 mg via ORAL
  Filled 2020-09-22 (×3): qty 1

## 2020-09-22 MED ORDER — METOPROLOL TARTRATE 50 MG PO TABS: 50.0000 mg | ORAL_TABLET | Freq: Every day | ORAL | Status: DC

## 2020-09-22 MED ORDER — PREGABALIN 25 MG PO CAPS
50.0000 mg | ORAL_CAPSULE | Freq: Two times a day (BID) | ORAL | Status: DC
  Administered 2020-09-22 – 2020-09-25 (×6): 50 mg via ORAL
  Filled 2020-09-22 (×6): qty 2

## 2020-09-22 MED ORDER — TICAGRELOR 90 MG PO TABS
90.0000 mg | ORAL_TABLET | Freq: Two times a day (BID) | ORAL | Status: DC
  Administered 2020-09-22 – 2020-09-25 (×6): 90 mg via ORAL
  Filled 2020-09-22 (×7): qty 1

## 2020-09-22 MED ORDER — INSULIN ASPART 100 UNIT/ML IJ SOLN
0.0000 [IU] | Freq: Three times a day (TID) | INTRAMUSCULAR | Status: DC
  Administered 2020-09-23: 2 [IU] via SUBCUTANEOUS
  Administered 2020-09-24: 1 [IU] via SUBCUTANEOUS

## 2020-09-22 MED ORDER — ATORVASTATIN CALCIUM 40 MG PO TABS
40.0000 mg | ORAL_TABLET | Freq: Every day | ORAL | Status: DC
  Administered 2020-09-23 – 2020-09-25 (×3): 40 mg via ORAL
  Filled 2020-09-22 (×3): qty 1

## 2020-09-22 MED ORDER — OXYCODONE HCL 5 MG PO TABS
5.0000 mg | ORAL_TABLET | ORAL | Status: DC | PRN
  Administered 2020-09-22 – 2020-09-23 (×2): 5 mg via ORAL
  Filled 2020-09-22 (×2): qty 1

## 2020-09-22 MED ORDER — SENNOSIDES-DOCUSATE SODIUM 8.6-50 MG PO TABS: 1.0000 | ORAL_TABLET | Freq: Every evening | ORAL | Status: DC | PRN

## 2020-09-22 MED ORDER — SODIUM CHLORIDE 0.9 % IV SOLN: 250.0000 mL | INTRAVENOUS | Status: DC | PRN

## 2020-09-22 MED ORDER — LACTATED RINGERS IV BOLUS: 500.0000 mL | Freq: Once | INTRAVENOUS | Status: DC

## 2020-09-22 MED ORDER — METOPROLOL TARTRATE 100 MG PO TABS: 100.0000 mg | ORAL_TABLET | Freq: Every day | ORAL | Status: DC
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
## 2020-09-22 NOTE — ED Notes (Signed)
Assume care on pt, pt resting on bed, ortho tech at the bedside for splint, pain medication just given by prior nurse.

## 2020-09-22 NOTE — ED Provider Notes (Signed)
Emergency Department Provider Note   I have reviewed the triage vital signs and the nursing notes.   HISTORY  Chief Complaint No chief complaint on file.   HPI Anna [name] is a 70 y.o. female with past medical history reviewed below, on Brilinta, presents to the emergency department after mechanical fall at home.  She arrives by EMS and is activated as a level 2 trauma per protocol.  She states that she tripped on an area rug while wearing crocs.  She fell forward landing on her face/forehead which hit the ground.  She is having severe pain in her right arm.  She denies pain in other locations which is her lower extremities, abdomen, back.  She not having chest pain.  She denies any syncope symptoms.  No past medical history on file.  Patient Active Problem List   Diagnosis Date Noted   Type 2 diabetes mellitus without complication, with Earlie Schank-term current use of insulin ([place]) 09/22/2020   Hyperlipidemia 09/22/2020   Closed right humeral fracture 09/22/2020   HTN (hypertension) 09/22/2020   Acute combined systolic and diastolic congestive heart failure ([place]) 09/22/2020   CAD (coronary artery disease) 09/22/2020   CKD (chronic kidney disease), stage III ([place]) 09/22/2020   Thrombocytosis 09/22/2020   Chronic back pain 09/22/2020   RLS (restless legs syndrome) 09/22/2020    Allergies Antihistamines, diphenhydramine-type  No family history on file.  Social History    Review of Systems  Constitutional: No fever/chills Cardiovascular: Denies chest pain. Respiratory: Denies shortness of breath. Gastrointestinal: No abdominal pain.  No nausea, no vomiting.  No diarrhea.  No constipation. Genitourinary: Negative for dysuria. Musculoskeletal: Negative for back pain. Severe right arm pain.  Skin: Negative for rash. Neurological: Negative for focal weakness or numbness. Positive HA.   10-point ROS otherwise  negative.  ____________________________________________   PHYSICAL EXAM:  VITAL SIGNS: ED Triage Vitals  Enc Vitals Group     BP 09/22/20 1032 (!) 207/79     Pulse Rate 09/22/20 1032 (!) 53     Resp 09/22/20 1032 12     Temp 09/22/20 1032 97.9 F (36.6 C)     Temp Source 09/22/20 1032 Oral     SpO2 09/22/20 1032 99 %    Constitutional: Alert and oriented. Well appearing and in no acute distress. Eyes: Conjunctivae are normal. PERRL. EOMI. Head: Atraumatic. Nose: No congestion/rhinnorhea. Mouth/Throat: Mucous membranes are moist.  Oropharynx non-erythematous. Neck: No stridor.  C collar in place.  Cardiovascular: Bradycardia. Good peripheral circulation. Grossly normal heart sounds.   Respiratory: Normal respiratory effort.  No retractions. Lungs CTAB. Gastrointestinal: Soft and nontender. No distention.  Musculoskeletal: No skin tenting or bruising but patient has severe tenderness through the midshaft of the right humerus.  No open fracture.  No tenderness to the elbow or shoulder although range of motion limited by pain in the mid arm.  No wrist tenderness.  Normal range of motion of the left upper extremity and bilateral lower extremities. Neurologic:  Normal speech and language. No gross focal neurologic deficits are appreciated.  Skin:  Skin is warm, dry and intact. No rash noted.   ____________________________________________   LABS (all labs ordered are listed, but only abnormal results are displayed)  Labs Reviewed  COMPREHENSIVE METABOLIC PANEL - Abnormal; Notable for the following components:      Result Value   Glucose, Bld 110 (*)    BUN 25 (*)    Creatinine, Ser 1.31 (*)    Total Protein 6.1 (*)  Albumin 2.9 (*)    GFR, Estimated 43 (*)    All other components within normal limits  CBC WITH DIFFERENTIAL/PLATELET - Abnormal; Notable for the following components:   RBC 3.40 (*)    Hemoglobin 11.0 (*)    HCT 34.2 (*)    MCV 100.6 (*)    RDW 15.6 (*)     Platelets 408 (*)    All other components within normal limits  CREATININE, SERUM - Abnormal; Notable for the following components:   Creatinine, Ser 2.08 (*)    GFR, Estimated 25 (*)    All other components within normal limits  HEMOGLOBIN A1C - Abnormal; Notable for the following components:   Hgb A1c MFr Bld 7.4 (*)    All other components within normal limits  BASIC METABOLIC PANEL - Abnormal; Notable for the following components:   Glucose, Bld 116 (*)    BUN 34 (*)    Creatinine, Ser 1.96 (*)    GFR, Estimated 27 (*)    Anion gap 3 (*)    All other components within normal limits  CBC WITH DIFFERENTIAL/PLATELET - Abnormal; Notable for the following components:   RBC 3.02 (*)    Hemoglobin 9.8 (*)    HCT 30.6 (*)    MCV 101.3 (*)    RDW 15.9 (*)    All other components within normal limits  GLUCOSE, CAPILLARY - Abnormal; Notable for the following components:   Glucose-Capillary 137 (*)    All other components within normal limits  GLUCOSE, CAPILLARY - Abnormal; Notable for the following components:   Glucose-Capillary 159 (*)    All other components within normal limits  GLUCOSE, CAPILLARY - Abnormal; Notable for the following components:   Glucose-Capillary 109 (*)    All other components within normal limits  GLUCOSE, CAPILLARY - Abnormal; Notable for the following components:   Glucose-Capillary 246 (*)    All other components within normal limits  GLUCOSE, CAPILLARY - Abnormal; Notable for the following components:   Glucose-Capillary 131 (*)    All other components within normal limits  GLUCOSE, CAPILLARY - Abnormal; Notable for the following components:   Glucose-Capillary 112 (*)    All other components within normal limits  BASIC METABOLIC PANEL - Abnormal; Notable for the following components:   Glucose, Bld 163 (*)    BUN 29 (*)    Creatinine, Ser 1.61 (*)    Calcium 8.8 (*)    GFR, Estimated 34 (*)    All other components within normal limits   GLUCOSE, CAPILLARY - Abnormal; Notable for the following components:   Glucose-Capillary 176 (*)    All other components within normal limits  CBG MONITORING, ED - Abnormal; Notable for the following components:   Glucose-Capillary 120 (*)    All other components within normal limits  RESP PANEL BY RT-PCR (FLU A&B, COVID) [id number]  PROTIME-INR  GLUCOSE, CAPILLARY  GLUCOSE, CAPILLARY  GLUCOSE, CAPILLARY     ____________________________________________  RADIOLOGY  Imaging reviewed   ____________________________________________   PROCEDURES  Procedure(s) performed:   Procedures  None ____________________________________________   INITIAL IMPRESSION / ASSESSMENT AND PLAN / ED COURSE  Pertinent labs & imaging results that were available during my care of the patient were reviewed by me and considered in my medical decision making (see chart for details).   Patient presents to the emergency department with severe pain in the right arm after a fall.  By protocol she was activated as a level 2 trauma she is on  Brilinta.  No lacerations or outward signs of head trauma.  She arrives in a c-collar.  Number CT imaging of the head and cervical spine along with plain films of the chest and right humerus.   Plain films of the right humerus show a midshaft, angulated, displaced fracture.  She has normal sensation and pulses in the extremity.   Discussed with ortho. Plan for admit. Will manage non-operative.   Discussed patient's case with [facility]  to request admission. Patient and family (if present) updated with plan. Care transferred to [facility] service.  I reviewed all nursing notes, vitals, pertinent old records, EKGs, labs, imaging (as available).  ____________________________________________  FINAL CLINICAL IMPRESSION(S) / ED DIAGNOSES  Final diagnoses:  Fall, initial encounter  Closed displaced oblique fracture of shaft of right humerus, initial encounter     MEDICATIONS GIVEN  DURING THIS VISIT:  Medications  fentaNYL (SUBLIMAZE) injection 50 mcg (50 mcg Intravenous Given 09/22/20 1054)  morphine 4 MG/ML injection 4 mg (4 mg Intravenous Given 09/22/20 1147)  fentaNYL (SUBLIMAZE) injection 50 mcg (50 mcg Intravenous Given 09/22/20 1436)  fentaNYL (SUBLIMAZE) injection 50 mcg (50 mcg Intravenous Given 09/22/20 1602)     NEW OUTPATIENT MEDICATIONS STARTED DURING THIS VISIT:  Discharge Medication List as of 09/25/2020  7:17 PM     START taking these medications   Details  methocarbamol (ROBAXIN) 500 MG tablet Take 1 tablet (500 mg total) by mouth every 8 (eight) hours as needed for muscle spasms., Starting Sat 09/25/2020, Normal    oxyCODONE (OXY IR/ROXICODONE) 5 MG immediate release tablet Take 2 tablets (10 mg total) by mouth every 6 (six) hours as needed for up to 5 days for severe pain., Starting Sat 09/25/2020, Until Thu 09/30/2020 at 2359, Normal    senna-docusate (SENOKOT-S) 8.6-50 MG tablet Take 1 tablet by mouth at bedtime as needed for mild constipation., Starting Sat 09/25/2020, Normal        Note:  This document was prepared using Dragon voice recognition software and may include unintentional dictation errors.  Nanda Quinton, MD, [facility] Emergency Medicine    Amilcar Reever, Wonda Old[facility], MD 09/26/20 Valerie Roys

## 2020-09-22 NOTE — Progress Notes (Signed)
Patient arrived to 6N19, AX0X4, able to make needs known. No acute distress noted. No DOB/SOB/cough noted. 99% on room air. VS stable BP116/51 T98.4 PR55 [id number]. RUA on sling & compression wrap (NWB). Extremity is warm & dry with +2 radial pulse. c/o 8/10 pain to the extremity. Medicated PRN. Patient was oriented to call bell and surroundings.  Call bell within reach and will continue monitor.

## 2020-09-22 NOTE — Progress Notes (Addendum)
    BRIEF OVERNIGHT PROGRESS REPORT  Received notification from Pharmacy of current dose of Toprol that patient takes at home. Adjusted accordingly and for previous notation to continue home medication of such.   Gershon Cull MSNA ACNPC-AG Acute Care Nurse Practitioner [place]

## 2020-09-22 NOTE — ED Notes (Signed)
Report attempted 

## 2020-09-22 NOTE — Consult Note (Signed)
Reason for Consult:Right humerus fx Referring Physician: Nanda Quinton Time called: 1207 Time at bedside: [place] is an 70 y.o. female.  HPI: Shakiara was ambulating with her walker this morning and lost her balance and fell forwards with her arm trapped under her. She had immediate arm pain and it felt broken. She was brought to the ED as a level 2 trauma activation because of a fall on a blood thinner. X-rays showed a humerus fx and orthopedic surgery was consulted.   No past medical history on file.  No family history on file.  Social History:  has no history on file for tobacco use, alcohol use, and drug use.  Allergies: Not on File  Medications: I have reviewed the patient's current medications.  Results for orders placed or performed during the hospital encounter of 09/22/20 (from the past 48 hour(s))  Comprehensive metabolic panel     Status: Abnormal   Collection Time: 09/22/20 10:46 AM  Result Value Ref Range   Sodium 139 135 - 145 mmol/L   Potassium 3.8 3.5 - 5.1 mmol/L   Chloride 106 98 - 111 mmol/L   CO2 28 22 - 32 mmol/L   Glucose, Bld 110 (H) 70 - 99 mg/dL    Comment: Glucose reference range applies only to samples taken after fasting for at least 8 hours.   BUN 25 (H) 8 - 23 mg/dL   Creatinine, Ser 1.31 (H) 0.44 - 1.00 mg/dL   Calcium 9.5 8.9 - 10.3 mg/dL   Total Protein 6.1 (L) 6.5 - 8.1 g/dL   Albumin 2.9 (L) 3.5 - 5.0 g/dL   AST 30 15 - 41 U/L   ALT 24 0 - 44 U/L   Alkaline Phosphatase 72 38 - 126 U/L   Total Bilirubin 0.6 0.3 - 1.2 mg/dL   GFR, Estimated 43 (L) >60 mL/min    Comment: (NOTE) Calculated using the CKD-EPI Creatinine Equation (2021)    Anion gap 5 5 - 15    Comment: Performed at [facility], [place] [address] [place], [place] [address]  CBC with Differential     Status: Abnormal   Collection Time: 09/22/20 10:46 AM  Result Value Ref Range   WBC 5.4 4.0 - 10.5 K/uL   RBC 3.40 (L) 3.87 - 5.11 MIL/uL   Hemoglobin 11.0 (L)  12.0 - 15.0 g/dL   HCT 34.2 (L) 36.0 - 46.0 %   MCV 100.6 (H) 80.0 - 100.0 fL   MCH 32.4 26.0 - 34.0 pg   MCHC 32.2 30.0 - 36.0 g/dL   RDW 15.6 (H) 11.5 - 15.5 %   Platelets 408 (H) 150 - 400 K/uL   nRBC 0.0 0.0 - 0.2 %   Neutrophils Relative % 66 %   Neutro Abs 3.6 1.7 - 7.7 K/uL   Lymphocytes Relative 22 %   Lymphs Abs 1.2 0.7 - 4.0 K/uL   Monocytes Relative 8 %   Monocytes Absolute 0.4 0.1 - 1.0 K/uL   Eosinophils Relative 2 %   Eosinophils Absolute 0.1 0.0 - 0.5 K/uL   Basophils Relative 1 %   Basophils Absolute 0.1 0.0 - 0.1 K/uL   Immature Granulocytes 1 %   Abs Immature Granulocytes 0.04 0.00 - 0.07 K/uL    Comment: Performed at [facility], [place] [address] [place], [place] [address]  Protime-INR     Status: None   Collection Time: 09/22/20 10:46 AM  Result Value Ref Range  Prothrombin Time 13.3 11.4 - 15.2 seconds   INR 1.0 0.8 - 1.2    Comment: (NOTE) INR goal varies based on device and disease states. Performed at [facility], [place] [address] [place], [place] [address]   Resp Panel by RT-PCR (Flu A&B, Covid) Nasopharyngeal Swab     Status: None   Collection Time: 09/22/20 11:28 AM   Specimen: Nasopharyngeal Swab; Nasopharyngeal(NP) swabs in vial transport medium  Result Value Ref Range   SARS Coronavirus 2 by RT PCR NEGATIVE NEGATIVE    Comment: (NOTE) SARS-CoV-2 target nucleic acids are NOT DETECTED.  The SARS-CoV-2 RNA is generally detectable in upper respiratory specimens during the acute phase of infection. The lowest concentration of SARS-CoV-2 viral copies this assay can detect is 138 copies/mL. A negative result does not preclude SARS-Cov-2 infection and should not be used as the sole basis for treatment or other patient management decisions. A negative result may occur with  improper specimen collection/handling, submission of specimen other than nasopharyngeal swab, presence of viral mutation(s) within the areas targeted by this assay,  and inadequate number of viral copies(<138 copies/mL). A negative result must be combined with clinical observations, patient history, and epidemiological information. The expected result is Negative.  Fact Sheet for Patients:  EntrepreneurPulse.com.au  Fact Sheet for Healthcare Providers:  IncredibleEmployment.be  This test is no t yet approved or cleared by the [place] FDA and  has been authorized for detection and/or diagnosis of SARS-CoV-2 by FDA under an Emergency Use Authorization (EUA). This EUA will remain  in effect (meaning this test can be used) for the duration of the COVID-19 declaration under Section 564(b)(1) of the Act, 21 U.S.C.section 360bbb-3(b)(1), unless the authorization is terminated  or revoked sooner.       Influenza A by PCR NEGATIVE NEGATIVE   Influenza B by PCR NEGATIVE NEGATIVE    Comment: (NOTE) The Xpert Xpress SARS-CoV-2/FLU/RSV plus assay is intended as an aid in the diagnosis of influenza from Nasopharyngeal swab specimens and should not be used as a sole basis for treatment. Nasal washings and aspirates are unacceptable for Xpert Xpress SARS-CoV-2/FLU/RSV testing.  Fact Sheet for Patients: EntrepreneurPulse.com.au  Fact Sheet for Healthcare Providers: IncredibleEmployment.be  This test is not yet approved or cleared by the [place] FDA and has been authorized for detection and/or diagnosis of SARS-CoV-2 by FDA under an Emergency Use Authorization (EUA). This EUA will remain in effect (meaning this test can be used) for the duration of the COVID-19 declaration under Section 564(b)(1) of the Act, 21 U.S.C. section 360bbb-3(b)(1), unless the authorization is terminated or revoked.  Performed at [facility], [place] [address] [place], [place] [address]     CT Head Wo Contrast  Result Date: 09/22/2020 CLINICAL DATA:  Fall, on anticoagulation EXAM: CT  HEAD WITHOUT CONTRAST TECHNIQUE: Contiguous axial images were obtained from the base of the skull through the vertex without intravenous contrast. COMPARISON:  None. FINDINGS: Brain: There is no acute intracranial hemorrhage, mass effect, or edema. Gray-white differentiation is preserved. There is no extra-axial fluid collection. Prominence of the ventricles and sulci reflects generalized parenchymal volume loss patchy hypoattenuation in the supratentorial white matter is nonspecific but may reflect mild to moderate chronic microvascular ischemic changes. Vascular: There is atherosclerotic calcification at the skull base. Skull: Calvarium is unremarkable. Sinuses/Orbits: No acute finding. Other: None. IMPRESSION: No evidence of acute intracranial injury. Electronically Signed   By: Macy Mis M.D.   On: 09/22/2020 11:46  CT Cervical Spine Wo Contrast  Result Date: 09/22/2020 CLINICAL DATA:  Fall EXAM: CT CERVICAL SPINE WITHOUT CONTRAST TECHNIQUE: Multidetector CT imaging of the cervical spine was performed without intravenous contrast. Multiplanar CT image reconstructions were also generated. COMPARISON:  01/09/2019 FINDINGS: Alignment: No significant listhesis. Skull base and vertebrae: No acute fracture. Vertebral body heights are maintained. Soft tissues and spinal canal: No prevertebral fluid or swelling. No visible canal hematoma. Disc levels: Degenerative changes are present without high-grade osseous encroachment on the spinal canal. Upper chest: No apical lung mass. Other: Calcified plaque at the common carotid bifurcations. IMPRESSION: No acute cervical spine fracture. Electronically Signed   By: Macy Mis M.D.   On: 09/22/2020 11:50   DG Chest Portable 1 View  Result Date: 09/22/2020 CLINICAL DATA:  Fall. EXAM: PORTABLE CHEST 1 VIEW COMPARISON:  09/07/2020. FINDINGS: Mediastinum and hilar structures normal. Coronary artery stents noted. Cardiomegaly. Low lung volumes mild bibasilar  atelectasis. Mild bibasilar interstitial edema/infiltrates cannot be excluded. No pleural effusion or pneumothorax. No evidence of displaced rib fracture. Degenerative change thoracic spine and both shoulders. IMPRESSION: 1. Coronary artery stents noted. Cardiomegaly. No pulmonary venous congestion. 2. Low lung volumes with mild bibasilar atelectasis. Mild bibasilar interstitial edema/infiltrates cannot be excluded. 3.  No evidence of displaced rib fracture or pneumothorax. Electronically Signed   By: Marcello Moores  Register   On: 09/22/2020 11:56   DG Humerus Right  Result Date: 09/22/2020 CLINICAL DATA:  Fall. EXAM: RIGHT HUMERUS - 2+ VIEW COMPARISON:  No recent prior. FINDINGS: Severely angulated, overriding, displaced fracture of the midportion of the right humerus noted. No evidence of shoulder dislocation noted. Degenerative changes noted about the right shoulder and elbow. IMPRESSION: Severely angulated, overriding, displaced fracture of the midportion of the right humerus. Electronically Signed   By: Marcello Moores  Register   On: 09/22/2020 11:19    Review of Systems  HENT:  Negative for ear discharge, ear pain, hearing loss and tinnitus.   Eyes:  Negative for photophobia and pain.  Respiratory:  Negative for cough and shortness of breath.   Cardiovascular:  Negative for chest pain.  Gastrointestinal:  Negative for abdominal pain, nausea and vomiting.  Genitourinary:  Negative for dysuria, flank pain, frequency and urgency.  Musculoskeletal:  Positive for arthralgias (Right arm) and back pain. Negative for myalgias and neck pain.  Neurological:  Negative for dizziness and headaches.  Hematological:  Does not bruise/bleed easily.  Psychiatric/Behavioral:  The patient is not nervous/anxious.   Blood pressure (!) 167/63, pulse (!) 48, temperature 97.9 F (36.6 C), temperature source Oral, resp. rate 11, SpO2 100 %. Physical Exam Constitutional:      General: She is not in acute distress.    Appearance:  She is well-developed. She is not diaphoretic.  HENT:     Head: Normocephalic and atraumatic.  Eyes:     General: No scleral icterus.       Right eye: No discharge.        Left eye: No discharge.     Conjunctiva/sclera: Conjunctivae normal.  Cardiovascular:     Rate and Rhythm: Normal rate and regular rhythm.  Pulmonary:     Effort: Pulmonary effort is normal. No respiratory distress.  Musculoskeletal:     Cervical back: Normal range of motion.     Comments: Right shoulder, elbow, wrist, digits- no skin wounds, severe TTP upper arm, no instability, no blocks to motion  Sens  Ax/R/M/U intact  Mot   Ax/ R/ PIN/ M/ AIN/ U intact  Rad 2+  Skin:    General: Skin is warm and dry.  Neurological:     Mental Status: She is alert.  Psychiatric:        Mood and Affect: Mood normal.        Behavior: Behavior normal.    Assessment/Plan: Right humerus fx -- Splint and sling, NWB. Will try to manage non-operatively. Will probably need to come in for PT/OT and pain control given her baseline RW dependence.    Lisette Abu, PA-C Orthopedic Surgery [phone] 09/22/2020, 2:00 PM

## 2020-09-22 NOTE — ED Notes (Signed)
Patient transported to CT 

## 2020-09-22 NOTE — ED Notes (Signed)
Report attempted on 6N, unit secretary reported room was changed and not clean, CN notified.

## 2020-09-22 NOTE — H&P (Signed)
History and Physical    Anna [name] YNW:[id number] DOB: 02-Jul-1950 DOA: 09/22/2020  PCP: Leanna Battles, MD Consultants:  cardiology: Dr. Gasper Sells, Hematology: Dr. Irene Limbo, nephrology: [facility]  Patient coming from:  Home - lives with husband   Chief Complaint: fall and right arm pain   HPI: Anna [name] is a 70 y.o. female with medical history significant for   Combined systolic and diastolic heart failure, CAD s/p DES to LAD and OM2 07/27/20, HTN, DM2, CKD IIIb, HLD, peripheral neuropathy, chronic back pain, artheritis, essential thrombocytosis, RLS who presented to ER via EMS after she fell and tripped on her rug at home and had immediate right upper arm pain.    She was walking and tripped on the rug. She called her husband who came home and found her face down on the floor. She did not have her walker, she fell face down and arms out. Immediate pain in her right breast and both arms. She states pain was off the chart. They called 911 and was brought to ER.  She denies any syncope, chest pain, palpitations, fever/chills, headache, vision changes. Denies any recent illness, diarrhea, vomiting, stomach pain.    She is right handed.   ED Course: vitals: 207/79, HR; 53, rr: 12, afebrile and oxygen: 99% on room air. She  received 177mcg of fentanyl with EMS en route to hospital. Right mid shaft humeral fracture. Ortho was consulted and do not want to operate at this time. Conservative management at this point. Asked to admit for PT/OT, pain control.   Review of Systems: As per HPI; otherwise review of systems reviewed and negative.   Ambulatory Status:  Ambulates with walker   COVID Vaccine Status:  pfizer x 2   No past medical history on file. Please see list below. Reviewed and chart merging.    Social History   Socioeconomic History   Marital status: Married    Spouse name: Not on file   Number of children: Not on file   Years of education: Not on file   Highest  education level: Not on file  Occupational History   Not on file  Tobacco Use   Smoking status: Not on file   Smokeless tobacco: Not on file  Substance and Sexual Activity   Alcohol use: Not on file   Drug use: Not on file   Sexual activity: Not on file  Other Topics Concern   Not on file  Social History Narrative   Not on file   Social Determinants of Health   Financial Resource Strain: Not on file  Food Insecurity: Not on file  Transportation Needs: Not on file  Physical Activity: Not on file  Stress: Not on file  Social Connections: Not on file  Intimate Partner Violence: Not on file    Allergies  Allergen Reactions   Antihistamines, Diphenhydramine-Type     unknown    No family history on file. Reviewed, chart merging.   Prior to Admission medications   Medication Sig Start Date End Date Taking? Authorizing Provider  atorvastatin (LIPITOR) 40 MG tablet Take 40 mg by mouth daily. 07/07/20  Yes [provider]  BRILINTA 90 MG TABS tablet Take 90 mg by mouth 2 (two) times daily. 09/06/20  Yes [provider]  celecoxib (CELEBREX) 200 MG capsule Take 200 mg by mouth 2 (two) times daily as needed for pain or edema. 06/15/20  Yes [provider]  fenofibrate 160 MG tablet Take 160 mg by mouth  daily. 07/31/20  Yes [provider]  hydroxyurea (HYDREA) 500 MG capsule Take 500 mg by mouth daily. 08/30/20  Yes [provider]  metoprolol succinate (TOPROL-XL) 50 MG 24 hr tablet Take 50 mg by mouth daily. 08/11/20  Yes [provider]  nortriptyline (PAMELOR) 50 MG capsule Take 50 mg by mouth at bedtime. 05/25/20  Yes [provider]  pregabalin (LYRICA) 50 MG capsule Take 50 mg by mouth 2 (two) times daily. 09/06/20  Yes [provider]  rOPINIRole (REQUIP) 0.5 MG tablet Take 0.5 mg by mouth 2 (two) times daily. 07/30/20  Yes [provider]  TRESIBA FLEXTOUCH 100 UNIT/ML FlexTouch Pen Inject 30 Units into  the skin daily. 09/11/20  Yes [provider]    Physical Exam: Vitals:   09/22/20 1330 09/22/20 1400 09/22/20 1439 09/22/20 1645  BP: (!) 167/63 103/88 (!) 148/55 120/61  Pulse: (!) 48 (!) 46 (!) 50 (!) 47  Resp: 11 12 15 13   Temp:      TempSrc:      SpO2: 100% 99% 100% 98%     General:  Appears calm and comfortable and is in NAD Eyes:  PERRL, EOMI, normal lids, iris ENT:  grossly normal hearing, lips & tongue, mmm; appropriate dentition Neck:  no LAD, masses or thyromegaly; no carotid bruits Cardiovascular:  RRR, no m/r/g. No LE edema.  Respiratory:   CTA bilaterally with no wheezes/rales/rhonchi.  Normal respiratory effort. Abdomen:  soft, NT, ND, NABS Back:   could not assess  Skin:  no rash or induration seen on limited exam. Bruising on bilateral arms.  Musculoskeletal:  right upper arm held across chest. Remarkable edema over entire humerus. Very sensitive to touch. Sensation intact.  Radial and ulnar pulses intact. Hand grip decreased. Lower extremity:  No LE edema.  Limited foot exam with no ulcerations.  2+ distal pulses. Psychiatric:  grossly normal mood and affect, speech fluent and appropriate, AOx3 Neurologic:  CN 2-12 grossly intact, moves all extremities in coordinated fashion, sensation intact    Radiological Exams on Admission: Independently reviewed - see discussion in A/P where applicable  CT Head Wo Contrast  Result Date: 09/22/2020 CLINICAL DATA:  Fall, on anticoagulation EXAM: CT HEAD WITHOUT CONTRAST TECHNIQUE: Contiguous axial images were obtained from the base of the skull through the vertex without intravenous contrast. COMPARISON:  None. FINDINGS: Brain: There is no acute intracranial hemorrhage, mass effect, or edema. Gray-white differentiation is preserved. There is no extra-axial fluid collection. Prominence of the ventricles and sulci reflects generalized parenchymal volume loss patchy hypoattenuation in the supratentorial white matter is  nonspecific but may reflect mild to moderate chronic microvascular ischemic changes. Vascular: There is atherosclerotic calcification at the skull base. Skull: Calvarium is unremarkable. Sinuses/Orbits: No acute finding. Other: None. IMPRESSION: No evidence of acute intracranial injury. Electronically Signed   By: Macy Mis M.D.   On: 09/22/2020 11:46   CT Cervical Spine Wo Contrast  Result Date: 09/22/2020 CLINICAL DATA:  Fall EXAM: CT CERVICAL SPINE WITHOUT CONTRAST TECHNIQUE: Multidetector CT imaging of the cervical spine was performed without intravenous contrast. Multiplanar CT image reconstructions were also generated. COMPARISON:  01/09/2019 FINDINGS: Alignment: No significant listhesis. Skull base and vertebrae: No acute fracture. Vertebral body heights are maintained. Soft tissues and spinal canal: No prevertebral fluid or swelling. No visible canal hematoma. Disc levels: Degenerative changes are present without high-grade osseous encroachment on the spinal canal. Upper chest: No apical lung mass. Other: Calcified plaque at the common carotid  bifurcations. IMPRESSION: No acute cervical spine fracture. Electronically Signed   By: Macy Mis M.D.   On: 09/22/2020 11:50   DG Chest Portable 1 View  Result Date: 09/22/2020 CLINICAL DATA:  Fall. EXAM: PORTABLE CHEST 1 VIEW COMPARISON:  09/07/2020. FINDINGS: Mediastinum and hilar structures normal. Coronary artery stents noted. Cardiomegaly. Low lung volumes mild bibasilar atelectasis. Mild bibasilar interstitial edema/infiltrates cannot be excluded. No pleural effusion or pneumothorax. No evidence of displaced rib fracture. Degenerative change thoracic spine and both shoulders. IMPRESSION: 1. Coronary artery stents noted. Cardiomegaly. No pulmonary venous congestion. 2. Low lung volumes with mild bibasilar atelectasis. Mild bibasilar interstitial edema/infiltrates cannot be excluded. 3.  No evidence of displaced rib fracture or pneumothorax.  Electronically Signed   By: Marcello Moores  Register   On: 09/22/2020 11:56   DG Humerus Right  Result Date: 09/22/2020 CLINICAL DATA:  Fall. EXAM: RIGHT HUMERUS - 2+ VIEW COMPARISON:  No recent prior. FINDINGS: Severely angulated, overriding, displaced fracture of the midportion of the right humerus noted. No evidence of shoulder dislocation noted. Degenerative changes noted about the right shoulder and elbow. IMPRESSION: Severely angulated, overriding, displaced fracture of the midportion of the right humerus. Electronically Signed   By: Marcello Moores  Register   On: 09/22/2020 11:19     Labs on Admission: I have personally reviewed the available labs and imaging studies at the time of the admission.  Pertinent labs:  Creatinine: 1.31/: BUN: 25 Hgb: 11.0. mcv: 100.6 Right humerus xray -severely angulated overriding, displaced fracture of the midportion of the right humerus noted.    Assessment/Plan Principal Problem:   Closed right humeral fracture -ortho consulted. Splinted in ER and recommend conservative treatment at this time due to multiple medical comorbidities which favor non operative treatment at this time.  -PT/OT consulted. She is right handed and depends on walker for walking -pain control with tylenol, oxycodone and morphine for severe pain. Has been on oxycodone recently for her back per husband, but no recent filled px in pmp website. . Adjust as needed for pain control   Active Problems:   Type 2 diabetes mellitus without complication, with long-term current use of insulin (HCC) --a1c pending -continue home tresiba -SSI/accuchecks    HTN (hypertension) -better after pain control -continue home medication    Acute combined systolic and diastolic congestive heart failure ([place]) -echo 4/202 with EF of 35-40% and moderately decreased LV function. Thought due to ischemia from CAD.  -euvolemic on exam -I/O, daily weights -telemetry     CAD (coronary artery disease) -CAD s/p DES to  LAD and OM 2 on 07/27/20 -DAPT for at least a year. Continued her ASA/brilinta/statin and beta blocker  -telemetry    Hyperlipidemia -continue home statin -LDL to goal in 06/2020    CKD (chronic kidney disease), stage III (HCC) -baseline around 1.19-1.42 -stable    Thrombocytosis -continue her hydroxyurea -followed outpatient by hematology, Dr. Irene Limbo     Chronic back pain -pain control per above.     RLS (restless legs syndrome)  -continue home medication lyrica, requip.     There is no height or weight on file to calculate BMI.  Level of care: Telemetry Medical DVT prophylaxis:  lovenox  Code Status:  Full - confirmed with patient Family Communication: husband present at bedside: [place] Gave Disposition Plan:  The patient is from: home  Anticipated d/c is to: home. May need HH depending on ot/pt eval             Do think she  will need >2 midnight stay for therapy and making sure she is safe to ambulate at home.   Consults called: orthopedics in the ER.   Admission status:  inpatient    Orma Flaming MD [facility]   How to contact the [facility] Attending or Consulting provider [place] or covering provider during after hours [place], for this patient?  Check the care team in [facility] and look for a) attending/consulting [facility] provider listed and b) the [facility] team listed Log into [email or web address] and use [place]'s universal password to access. If you do not have the password, please contact the hospital operator. Locate the [facility] provider you are looking for under [facility] and page to a number that you can be directly reached. If you still have difficulty reaching the provider, please page the [facility] (Director on Call) for the Hospitalists listed on amion for assistance.   09/22/2020, 5:05 PM

## 2020-09-23 ENCOUNTER — Inpatient Hospital Stay (HOSPITAL_COMMUNITY): Payer: Medicare Other

## 2020-09-23 DIAGNOSIS — S42321A Displaced transverse fracture of shaft of humerus, right arm, initial encounter for closed fracture: Secondary | ICD-10-CM

## 2020-09-23 LAB — CBC WITH DIFFERENTIAL/PLATELET
Abs Immature Granulocytes: 0.04 10*3/uL (ref 0.00–0.07)
Basophils Absolute: 0 10*3/uL (ref 0.0–0.1)
Basophils Relative: 1 %
Eosinophils Absolute: 0.1 10*3/uL (ref 0.0–0.5)
Eosinophils Relative: 1 %
HCT: 30.6 % — ABNORMAL LOW (ref 36.0–46.0)
Hemoglobin: 9.8 g/dL — ABNORMAL LOW (ref 12.0–15.0)
Immature Granulocytes: 1 %
Lymphocytes Relative: 25 %
Lymphs Abs: 1.9 10*3/uL (ref 0.7–4.0)
MCH: 32.5 pg (ref 26.0–34.0)
MCHC: 32 g/dL (ref 30.0–36.0)
MCV: 101.3 fL — ABNORMAL HIGH (ref 80.0–100.0)
Monocytes Absolute: 0.6 10*3/uL (ref 0.1–1.0)
Monocytes Relative: 8 %
Neutro Abs: 5.2 10*3/uL (ref 1.7–7.7)
Neutrophils Relative %: 64 %
Platelets: 385 10*3/uL (ref 150–400)
RBC: 3.02 MIL/uL — ABNORMAL LOW (ref 3.87–5.11)
RDW: 15.9 % — ABNORMAL HIGH (ref 11.5–15.5)
WBC: 7.9 10*3/uL (ref 4.0–10.5)
nRBC: 0 % (ref 0.0–0.2)

## 2020-09-23 LAB — BASIC METABOLIC PANEL
Anion gap: 3 — ABNORMAL LOW (ref 5–15)
BUN: 34 mg/dL — ABNORMAL HIGH (ref 8–23)
CO2: 26 mmol/L (ref 22–32)
Calcium: 9.2 mg/dL (ref 8.9–10.3)
Chloride: 107 mmol/L (ref 98–111)
Creatinine, Ser: 1.96 mg/dL — ABNORMAL HIGH (ref 0.44–1.00)
GFR, Estimated: 27 mL/min — ABNORMAL LOW (ref >60)
Glucose, Bld: 116 mg/dL — ABNORMAL HIGH (ref 70–99)
Potassium: 4.1 mmol/L (ref 3.5–5.1)
Sodium: 136 mmol/L (ref 135–145)

## 2020-09-23 LAB — GLUCOSE, CAPILLARY
Glucose-Capillary: 92 mg/dL (ref 70–99)
Glucose-Capillary: 159 mg/dL — ABNORMAL HIGH (ref 70–99)
Glucose-Capillary: 109 mg/dL — ABNORMAL HIGH (ref 70–99)
Glucose-Capillary: 246 mg/dL — ABNORMAL HIGH (ref 70–99)

## 2020-09-23 MED ORDER — METHOCARBAMOL 500 MG PO TABS: 500.0000 mg | ORAL_TABLET | Freq: Three times a day (TID) | ORAL | Status: DC | PRN

## 2020-09-23 MED ORDER — MORPHINE SULFATE (PF) 2 MG/ML IV SOLN: 2.0000 mg | INTRAVENOUS | Status: DC | PRN

## 2020-09-23 MED ORDER — OXYCODONE HCL 5 MG PO TABS
5.0000 mg | ORAL_TABLET | ORAL | Status: DC | PRN
  Administered 2020-09-23 – 2020-09-24 (×6): 5 mg via ORAL
  Filled 2020-09-23 (×6): qty 1

## 2020-09-23 MED ORDER — FENTANYL CITRATE (PF) 100 MCG/2ML IJ SOLN
25.0000 ug | INTRAMUSCULAR | Status: DC | PRN
  Administered 2020-09-23 – 2020-09-24 (×3): 25 ug via INTRAVENOUS
  Filled 2020-09-23 (×3): qty 2

## 2020-09-23 MED ORDER — METHOCARBAMOL 500 MG PO TABS
500.0000 mg | ORAL_TABLET | Freq: Three times a day (TID) | ORAL | Status: DC
  Administered 2020-09-23 – 2020-09-25 (×7): 500 mg via ORAL
  Filled 2020-09-23 (×7): qty 1

## 2020-09-23 NOTE — Progress Notes (Signed)
[facility] Progress Note  Patient: Anna [name]    CZY:[id number]  DOA: 09/22/2020     Date of Service: the patient was seen and examined on 09/23/2020  Brief hospital course: Past medical history of CAD SP DES 5/22 on Brilinta, type II DM, CKD 3B, combined CHF, neuropathy, HLD, chronic pain, RLS. Presents with a fall on her way to see her cardiologist and sustained right shoulder fracture. Orthopedic consulted recommend conservative measures. Currently plan is pain control.  Subjective: Reports uncontrolled pain.  Frustrated and agitated.  No nausea no vomiting but no fever no chills.  No diarrhea no constipation.  Assessment and Plan: 1.  Closed right humeral fracture Offered a mechanical fall. Currently splinted and in sling. Orthopedic consulted.  No surgery. Outpatient follow-up with Dr. Sharol Given  2.  Recent CAD SP DES to LAD and OM2 on 5/22. Currently on dual antiplatelet for a year. Follow-up with cardiology. Cardiology clearance requested by orthopedic for surgical treatment for her spine disease.  3.  History of chronic back pain with right hip pain radiating to right knee 9/21 right L3-4 herniated nucleus pulposus with back and right leg pain, previous right L3-4 decompressive hemilaminectomy by Dr. Ronnald Ramp. Continues to report pain. Currently on Lyrica.  Will add Robaxin.  Monitor.  4.  Chronic combined systolic and diastolic CHF Currently euvolemic. No acute component. EF 35 to 40% at baseline. Follow-up with cardiology for GDMT.  5.  HLD Continue statin  6.  CKD 3 A Renal function stable. Currently no change in therapy.  7.  Bilateral knee pain. Reports that patient actually has left knee pain after the fall and appears to be swollen. Patient also reports right knee pain radiating from her hip and back area. Will get x-ray to rule out any acute abnormality.  8.  Pain control. Patient appears to have acute on chronic pain. On oxycodone as needed at home.   Currently we will use oxycodone every 3 hours as needed. On morphine, will switch to fentanyl.  Scheduled Meds:  aspirin EC  81 mg Oral Daily   atorvastatin  40 mg Oral Daily   enoxaparin (LOVENOX) injection  40 mg Subcutaneous Q24H   fenofibrate  160 mg Oral Daily   hydroxyurea  500 mg Oral Daily   insulin aspart  0-9 Units Subcutaneous TID WC   insulin glargine  15 Units Subcutaneous Daily   magnesium oxide  200 mg Oral Daily   methocarbamol  500 mg Oral TID   pregabalin  50 mg Oral BID   rOPINIRole  0.5 mg Oral BID   sodium chloride flush  3 mL Intravenous Q12H   ticagrelor  90 mg Oral BID   Continuous Infusions:  sodium chloride     PRN Meds: sodium chloride, acetaminophen **OR** acetaminophen, fentaNYL (SUBLIMAZE) injection, oxyCODONE, senna-docusate, sodium chloride flush  Body mass index is 28.03 kg/m.        DVT Prophylaxis:   enoxaparin (LOVENOX) injection 40 mg Start: 09/22/20 1700    Advance goals of care discussion: Pt is Full code.  Family Communication: no family was present at bedside, at the time of interview.   Data Reviewed: I have personally reviewed and interpreted daily labs, tele strips, imaging. Electrolytes stable.  Serum creatinine stable.  Hemoglobin dropped from 11.0-9.8 likely dilutional.  Physical Exam:  General: Appear in mild distress, no Rash; Oral Mucosa Clear, moist. no Abnormal Neck Mass Or lumps, Conjunctiva normal  Cardiovascular: S1 and S2 Present, aortic systolic  Murmur,  Respiratory: good respiratory effort, Bilateral Air entry present and CTA, no Crackles, no wheezes Abdomen: Bowel Sound present, Soft and no tenderness Extremities: no Pedal edema Neurology: alert and oriented to time, place, and person affect appropriate. no new focal deficit Gait not checked due to patient safety concerns  Vitals:   09/23/20 0755 09/23/20 1205 09/23/20 1500 09/23/20 1555  BP: (!) 151/49 (!) 127/44  (!) 146/54  Pulse: (!) 52 (!) 52  (!) 59   Resp: 17 18    Temp: 98.6 F (37 C) 98 F (36.7 C)  (!) 97.5 F (36.4 C)  TempSrc: Oral Oral  Oral  SpO2: 97% 95%  99%  Weight:      Height:   5\' 5"  (1.651 m)     Disposition:  Status is: Inpatient  Remains inpatient appropriate because:Ongoing active pain requiring inpatient pain management  Dispo: The patient is from: Home              Anticipated d/c is to: Home              Patient currently is not medically stable to d/c.   Difficult to place patient No  Time spent: 35 minutes. I reviewed all nursing notes, pharmacy notes, vitals, pertinent old records. I have discussed plan of care as described above with RN.  Author: Berle Mull, MD [facility] 09/23/2020 5:37 PM  To reach On-call, see care teams to locate the attending and reach out via [email or web address]. Between 7PM-7AM, please contact night-coverage If you still have difficulty reaching the attending provider, please page the [facility] (Director on Call) for [facility] on amion for assistance.

## 2020-09-23 NOTE — Consult Note (Signed)
ORTHOPAEDIC CONSULTATION  REQUESTING PHYSICIAN: Lavina Hamman, MD  Chief Complaint: Right midshaft humerus fracture  HPI: Anna [name] is a 70 y.o. female who presents with acute right midshaft humerus fracture after a fall at home.  Patient denies any head trauma from the fall.  Patient is status post lumbar spine surgery she states she still has symptoms with radicular pain and feels like the persistent back issues were the reason for her fall.  Patient is status post cardiac stent placement and she is undergoing active treatment for her cardiac disease.  She is not a good surgical candidate until her cardiac condition has been stabilized.  No past medical history on file.  Social History   Socioeconomic History   Marital status: Married    Spouse name: Not on file   Number of children: Not on file   Years of education: Not on file   Highest education level: Not on file  Occupational History   Not on file  Tobacco Use   Smoking status: Not on file   Smokeless tobacco: Not on file  Substance and Sexual Activity   Alcohol use: Not on file   Drug use: Not on file   Sexual activity: Not on file  Other Topics Concern   Not on file  Social History Narrative   Not on file   Social Determinants of Health   Financial Resource Strain: Not on file  Food Insecurity: Not on file  Transportation Needs: Not on file  Physical Activity: Not on file  Stress: Not on file  Social Connections: Not on file   No family history on file. - negative except otherwise stated in the family history section Allergies  Allergen Reactions   Antihistamines, Diphenhydramine-Type     unknown   Prior to Admission medications   Medication Sig Start Date End Date Taking? Authorizing Provider  aspirin EC 81 MG tablet Take 81 mg by mouth daily. Swallow whole.   Yes [provider]  atorvastatin (LIPITOR) 40 MG tablet Take 40 mg by mouth daily. 07/07/20  Yes [provider]   BRILINTA 90 MG TABS tablet Take 90 mg by mouth 2 (two) times daily. 09/06/20  Yes [provider]  CALCIUM PO Take 1 tablet by mouth daily.   Yes [provider]  fenofibrate 160 MG tablet Take 160 mg by mouth daily. 07/31/20  Yes [provider]  Glucosamine HCl (GLUCOSAMINE PO) Take 1 tablet by mouth daily. 1000 mg   Yes [provider]  hydroxyurea (HYDREA) 500 MG capsule Take 500 mg by mouth daily. 08/30/20  Yes [provider]  Magnesium 200 MG TABS Take by mouth.   Yes [provider]  pregabalin (LYRICA) 50 MG capsule Take 50 mg by mouth 2 (two) times daily. 09/06/20  Yes [provider]  rOPINIRole (REQUIP) 0.5 MG tablet Take 0.5 mg by mouth 2 (two) times daily. 07/30/20  Yes [provider]  TRESIBA FLEXTOUCH 100 UNIT/ML FlexTouch Pen Inject 15 Units into the skin daily. 09/11/20  Yes [provider]   CT Head Wo Contrast  Result Date: 09/22/2020 CLINICAL DATA:  Fall, on anticoagulation EXAM: CT HEAD WITHOUT CONTRAST TECHNIQUE: Contiguous axial images were obtained from the base of the skull through the vertex without intravenous contrast. COMPARISON:  None. FINDINGS: Brain: There is no acute intracranial hemorrhage, mass effect, or edema. Gray-white differentiation is preserved. There is no extra-axial fluid collection. Prominence of the ventricles and sulci reflects generalized parenchymal  volume loss patchy hypoattenuation in the supratentorial white matter is nonspecific but may reflect mild to moderate chronic microvascular ischemic changes. Vascular: There is atherosclerotic calcification at the skull base. Skull: Calvarium is unremarkable. Sinuses/Orbits: No acute finding. Other: None. IMPRESSION: No evidence of acute intracranial injury. Electronically Signed   By: Macy Mis M.D.   On: 09/22/2020 11:46   CT Cervical Spine Wo Contrast  Result Date: 09/22/2020 CLINICAL DATA:  Fall EXAM: CT CERVICAL SPINE  WITHOUT CONTRAST TECHNIQUE: Multidetector CT imaging of the cervical spine was performed without intravenous contrast. Multiplanar CT image reconstructions were also generated. COMPARISON:  01/09/2019 FINDINGS: Alignment: No significant listhesis. Skull base and vertebrae: No acute fracture. Vertebral body heights are maintained. Soft tissues and spinal canal: No prevertebral fluid or swelling. No visible canal hematoma. Disc levels: Degenerative changes are present without high-grade osseous encroachment on the spinal canal. Upper chest: No apical lung mass. Other: Calcified plaque at the common carotid bifurcations. IMPRESSION: No acute cervical spine fracture. Electronically Signed   By: Macy Mis M.D.   On: 09/22/2020 11:50   DG Chest Portable 1 View  Result Date: 09/22/2020 CLINICAL DATA:  Fall. EXAM: PORTABLE CHEST 1 VIEW COMPARISON:  09/07/2020. FINDINGS: Mediastinum and hilar structures normal. Coronary artery stents noted. Cardiomegaly. Low lung volumes mild bibasilar atelectasis. Mild bibasilar interstitial edema/infiltrates cannot be excluded. No pleural effusion or pneumothorax. No evidence of displaced rib fracture. Degenerative change thoracic spine and both shoulders. IMPRESSION: 1. Coronary artery stents noted. Cardiomegaly. No pulmonary venous congestion. 2. Low lung volumes with mild bibasilar atelectasis. Mild bibasilar interstitial edema/infiltrates cannot be excluded. 3.  No evidence of displaced rib fracture or pneumothorax. Electronically Signed   By: Marcello Moores  Register   On: 09/22/2020 11:56   DG Humerus Right  Result Date: 09/22/2020 CLINICAL DATA:  Fall. EXAM: RIGHT HUMERUS - 2+ VIEW COMPARISON:  No recent prior. FINDINGS: Severely angulated, overriding, displaced fracture of the midportion of the right humerus noted. No evidence of shoulder dislocation noted. Degenerative changes noted about the right shoulder and elbow. IMPRESSION: Severely angulated, overriding, displaced  fracture of the midportion of the right humerus. Electronically Signed   By: Marcello Moores  Register   On: 09/22/2020 11:19   - pertinent xrays, CT, MRI studies were reviewed and independently interpreted  Positive ROS: All other systems have been reviewed and were otherwise negative with the exception of those mentioned in the HPI and as above.  Physical Exam: General: Alert, no acute distress Psychiatric: Patient is competent for consent with normal mood and affect Lymphatic: No axillary or cervical lymphadenopathy Cardiovascular: No pedal edema Respiratory: No cyanosis, no use of accessory musculature GI: No organomegaly, abdomen is soft and non-tender    Images:  @ENCIMAGES @  Labs:  Lab Results  Component Value Date   HGBA1C 7.4 (H) 09/22/2020    Lab Results  Component Value Date   ALBUMIN 2.9 (L) 09/22/2020     CBC EXTENDED Latest Ref Rng & Units 09/23/2020 09/22/2020  WBC 4.0 - 10.5 K/uL 7.9 5.4  RBC 3.87 - 5.11 MIL/uL 3.02(L) 3.40(L)  HGB 12.0 - 15.0 g/dL 9.8(L) 11.0(L)  HCT 36.0 - 46.0 % 30.6(L) 34.2(L)  PLT 150 - 400 K/uL 385 408(H)  NEUTROABS 1.7 - 7.7 K/uL 5.2 3.6  LYMPHSABS 0.7 - 4.0 K/uL 1.9 1.2    Neurologic: Patient does not have protective sensation bilateral lower extremities.   MUSCULOSKELETAL:   Skin: Examination patient's right upper extremity is neurovascular intact she is in a sugar-tong splint  and the arm is stable.  Radiograph shows a long oblique spiral fracture of the mid humerus without any lytic bony changes.  Patient's most recent hemoglobin A1c is 7.4 and albumin of 2.9.  Assessment: Assessment: Closed spiral right midshaft humerus fracture with multiple medical problems including ongoing care for cardiac disease.  Plan: Plan for placement of a Sarmiento brace I will follow-up with her in the office in a week or 2.  Discussed that if her cardiac condition stabilizes and there is no appropriate healing we could proceed with open reduction  internal fixation surgery.  Thank you for the consult and the opportunity to see Ms. Lestine Box, [place] [phone] 12:29 PM

## 2020-09-23 NOTE — Progress Notes (Signed)
Orthopedic Tech Progress Note Patient Details:  Anna [name] October 31, 1950 [id number] Large Humeral Shaft Fracture brace has been ordered from [facility] Patient ID: Anna [name], female   DOB: 02/19/1951, 70 y.o.   MRN: [id number]  Jearld Lesch 09/23/2020, 12:57 PM

## 2020-09-23 NOTE — Evaluation (Signed)
Physical Therapy Evaluation Patient Details Name: Anna [name] MRN: [id number] DOB: [birth year] Today's Date: 09/23/2020   History of Present Illness  Pt is a 70 yr old female who fell on a rug at home and s/p  acute right midshaft humerus fracture. PMH: Combined systolic and diastolic heart failure, CAD s/p DES to LAD and OM2 07/27/20, HTN, DM2, CKD IIIb, HLD, peripheral neuropathy, chronic back pain, artheritis, essential thrombocytosis  Clinical Impression  Pt presents to PT with decr mobility due to RUE pain, poor balance, and weakness. Pt will be unable to use walker due to RUE fx which will make her fall risk even higher than baseline. Currently pt only transferring stand pivot transfer and unable to amb. Pt adamantly refusing SNF. Husband appears to understand the difficulty in pt returning home. Will need wheelchair and ambulance transport home at this point.     Follow Up Recommendations Home health PT;Supervision for mobility/OOB (Pt adamantly refusing SNF)    Equipment Recommendations  Wheelchair (measurements PT)    Recommendations for Other Services       Precautions / Restrictions Precautions Precautions: Fall;Shoulder Type of Shoulder Precautions: NWB to RUE Shoulder Interventions: Shoulder sling/immobilizer Restrictions Weight Bearing Restrictions: Yes RUE Weight Bearing: Non weight bearing      Mobility  Bed Mobility Overal bed mobility: Needs Assistance Bed Mobility: Sit to Supine       Sit to supine: Min assist;HOB elevated   General bed mobility comments: Assist to lower trunk and bring legs back into bed    Transfers Overall transfer level: Needs assistance Equipment used: 1 person hand held assist (+husband supporting RUE) Transfers: Sit to/from [facility] Sit to Stand: From elevated surface;Mod assist Stand pivot transfers: Mod assist;+2 safety/equipment       General transfer comment: Assist to bring hips up and for balance. Pt  taking very small pivotal steps from chair to bed  Ambulation/Gait             General Gait Details: Unable  Stairs            Wheelchair Mobility    Modified Rankin (Stroke Patients Only)       Balance Overall balance assessment: Needs assistance Sitting-balance support: No upper extremity supported;Feet supported Sitting balance-Leahy Scale: Fair     Standing balance support: Single extremity supported Standing balance-Leahy Scale: Poor Standing balance comment: UE support and min assist for static standing                             Pertinent Vitals/Pain Pain Assessment: 0-10 Pain Score: 8  Pain Descriptors / Indicators: Aching Pain Intervention(s): Limited activity within patient's tolerance;Premedicated before session    Home Living Family/patient expects to be discharged to:: Private residence Living Arrangements: Spouse/significant other   Type of Home: House Home Access: Stairs to enter Entrance Stairs-Rails: Left Entrance Stairs-Number of Steps: 4 Home Layout: One level Home Equipment: Environmental consultant - 2 wheels;Shower seat;Grab bars - tub/shower;Cane - single point      Prior Function Level of Independence: Independent with assistive device(s)         Comments: Pt reports walker at all times but when they fell did not have walker     Hand Dominance   Dominant Hand: Right    Extremity/Trunk Assessment   Upper Extremity Assessment Upper Extremity Assessment: Defer to OT evaluation    Lower Extremity Assessment Lower Extremity Assessment: Generalized weakness  Communication   Communication: No difficulties  Cognition Arousal/Alertness: Awake/alert Behavior During Therapy: WFL for tasks assessed/performed Overall Cognitive Status: Within Functional Limits for tasks assessed                                        General Comments      Exercises     Assessment/Plan    PT Assessment Patient needs  continued PT services  PT Problem List Decreased strength;Decreased activity tolerance;Decreased balance;Decreased mobility;Pain       PT Treatment Interventions DME instruction;Gait training;Stair training;Functional mobility training;Therapeutic activities;Therapeutic exercise;Balance training;Patient/family education    PT Goals (Current goals can be found in the Care Plan section)  Acute Rehab PT Goals Patient Stated Goal: return home PT Goal Formulation: With patient Time For Goal Achievement: 10/07/20 Potential to Achieve Goals: Fair    Frequency Min 3X/week   Barriers to discharge Inaccessible home environment Stairs to enter    Co-evaluation               AM-PAC PT "6 Clicks" Mobility  Outcome Measure Help needed turning from your back to your side while in a flat bed without using bedrails?: A Lot Help needed moving from lying on your back to sitting on the side of a flat bed without using bedrails?: A Lot Help needed moving to and from a bed to a chair (including a wheelchair)?: A Lot Help needed standing up from a chair using your arms (e.g., wheelchair or bedside chair)?: A Lot Help needed to walk in hospital room?: Total Help needed climbing 3-5 steps with a railing? : Total 6 Click Score: 10    End of Session Equipment Utilized During Treatment: Other (comment) (sling) Activity Tolerance: Patient limited by pain Patient left: in bed;with call bell/phone within reach;with bed alarm set;with family/visitor present   PT Visit Diagnosis: Other abnormalities of gait and mobility (R26.89);Pain    Time: 1130-1146 (+10 minutes earlier when pt declined attempts to stand and wanted to eat soup) PT Time Calculation (min) (ACUTE ONLY): 16 min   Charges:   PT Evaluation $PT Eval Moderate Complexity: 1 Mod PT Treatments $Therapeutic Activity: 8-22 mins        [place] Pager [phone] Office [place] 09/23/2020, 2:21 PM

## 2020-09-23 NOTE — TOC CAGE-AID Note (Signed)
Transition of Care [facility]) - CAGE-AID Screening   Patient Details  Name: Anna [name] MRN: [id number] Date of Birth: Nov 27, 1950  Transition of [facility]) CM/SW Contact:    Gaetano Hawthorne Tarpley-Carter, [place] Phone Number: 09/23/2020, 3:15 PM   Clinical Narrative: Pt participated with Cage-Aid.  Pt does not use substance or alcohol.  Tkeyah Burkman Tarpley-Carter, MSW, LCSW-A Pronouns:  She/Her/Hers                          [place] Clinical Social WorkerTransitions of Care Cell:  [phone] [email or web address]    CAGE-AID Screening:    Have You Ever Felt You Ought to Cut Down on Your Drinking or Drug Use?: No Have People Annoyed You By [employer] Your Drinking Or Drug Use?: No Have You Felt Bad Or Guilty About Your Drinking Or Drug Use?: No Have You Ever Had a Drink or Used Drugs First Thing In The Morning to Steady Your Nerves or to Get Rid of a Hangover?: No CAGE-AID Score: 0

## 2020-09-23 NOTE — Care Management (Signed)
    Durable Medical Equipment  (From admission, onward)           Start     Ordered   09/23/20 1507  For home use only DME standard manual wheelchair with seat cushion  Once       Comments: Patient suffers from  acute right midshaft humerus fracture prior to this required a walker for ambulation  which impairs their ability to perform daily activities like ambulating  in the home.  A cane will not resolve issue with performing activities of daily living. A wheelchair will allow patient to safely perform daily activities. Patient can safely propel the wheelchair in the home or has a caregiver who can provide assistance. Length of need 6 months . Accessories: elevating leg rests (ELRs), wheel locks, extensions and anti-tippers.  Seat and back cushions   09/23/20 1507

## 2020-09-23 NOTE — Consult Note (Signed)
Reason for Consult: Right arm fracture Referring Physician: Dr. Domenick Bookbinder Anna [name] is an 70 y.o. female.  HPI: Anna [name] is a 70 year old patient with right arm fracture.  She was on her way to cardiology appointment yesterday when she fell.  Denies any other orthopedic complaints.  She has ongoing back issues for which she states she needs more surgery.  She does use a walker because of these back issues.  She had stents placed for coronary artery disease approximately 2 and half months ago.  No past medical history on file.    No family history on file.  Social History:  has no history on file for tobacco use, alcohol use, and drug use.  Allergies:  Allergies  Allergen Reactions   Antihistamines, Diphenhydramine-Type     unknown    Medications: I have reviewed the patient's current medications.  Results for orders placed or performed during the hospital encounter of 09/22/20 (from the past 48 hour(s))  Comprehensive metabolic panel     Status: Abnormal   Collection Time: 09/22/20 10:46 AM  Result Value Ref Range   Sodium 139 135 - 145 mmol/L   Potassium 3.8 3.5 - 5.1 mmol/L   Chloride 106 98 - 111 mmol/L   CO2 28 22 - 32 mmol/L   Glucose, Bld 110 (H) 70 - 99 mg/dL    Comment: Glucose reference range applies only to samples taken after fasting for at least 8 hours.   BUN 25 (H) 8 - 23 mg/dL   Creatinine, Ser 1.31 (H) 0.44 - 1.00 mg/dL   Calcium 9.5 8.9 - 10.3 mg/dL   Total Protein 6.1 (L) 6.5 - 8.1 g/dL   Albumin 2.9 (L) 3.5 - 5.0 g/dL   AST 30 15 - 41 U/L   ALT 24 0 - 44 U/L   Alkaline Phosphatase 72 38 - 126 U/L   Total Bilirubin 0.6 0.3 - 1.2 mg/dL   GFR, Estimated 43 (L) >60 mL/min    Comment: (NOTE) Calculated using the CKD-EPI Creatinine Equation (2021)    Anion gap 5 5 - 15    Comment: Performed at [facility], [place] [address] [place], [place] [address]  CBC with Differential     Status: Abnormal   Collection Time: 09/22/20 10:46 AM  Result Value Ref  Range   WBC 5.4 4.0 - 10.5 K/uL   RBC 3.40 (L) 3.87 - 5.11 MIL/uL   Hemoglobin 11.0 (L) 12.0 - 15.0 g/dL   HCT 34.2 (L) 36.0 - 46.0 %   MCV 100.6 (H) 80.0 - 100.0 fL   MCH 32.4 26.0 - 34.0 pg   MCHC 32.2 30.0 - 36.0 g/dL   RDW 15.6 (H) 11.5 - 15.5 %   Platelets 408 (H) 150 - 400 K/uL   nRBC 0.0 0.0 - 0.2 %   Neutrophils Relative % 66 %   Neutro Abs 3.6 1.7 - 7.7 K/uL   Lymphocytes Relative 22 %   Lymphs Abs 1.2 0.7 - 4.0 K/uL   Monocytes Relative 8 %   Monocytes Absolute 0.4 0.1 - 1.0 K/uL   Eosinophils Relative 2 %   Eosinophils Absolute 0.1 0.0 - 0.5 K/uL   Basophils Relative 1 %   Basophils Absolute 0.1 0.0 - 0.1 K/uL   Immature Granulocytes 1 %   Abs Immature Granulocytes 0.04 0.00 - 0.07 K/uL    Comment: Performed at [facility], [place] [address], [place], [place] [address]  Protime-INR     Status:  None   Collection Time: 09/22/20 10:46 AM  Result Value Ref Range   Prothrombin Time 13.3 11.4 - 15.2 seconds   INR 1.0 0.8 - 1.2    Comment: (NOTE) INR goal varies based on device and disease states. Performed at [facility], [place] [address] [place], [place] [address]   Resp Panel by RT-PCR (Flu A&B, Covid) Nasopharyngeal Swab     Status: None   Collection Time: 09/22/20 11:28 AM   Specimen: Nasopharyngeal Swab; Nasopharyngeal(NP) swabs in vial transport medium  Result Value Ref Range   SARS Coronavirus 2 by RT PCR NEGATIVE NEGATIVE    Comment: (NOTE) SARS-CoV-2 target nucleic acids are NOT DETECTED.  The SARS-CoV-2 RNA is generally detectable in upper respiratory specimens during the acute phase of infection. The lowest concentration of SARS-CoV-2 viral copies this assay can detect is 138 copies/mL. A negative result does not preclude SARS-Cov-2 infection and should not be used as the sole basis for treatment or other patient management decisions. A negative result may occur with  improper specimen collection/handling, submission of specimen other than  nasopharyngeal swab, presence of viral mutation(s) within the areas targeted by this assay, and inadequate number of viral copies(<138 copies/mL). A negative result must be combined with clinical observations, patient history, and epidemiological information. The expected result is Negative.  Fact Sheet for Patients:  EntrepreneurPulse.com.au  Fact Sheet for Healthcare Providers:  IncredibleEmployment.be  This test is no t yet approved or cleared by the [place] FDA and  has been authorized for detection and/or diagnosis of SARS-CoV-2 by FDA under an Emergency Use Authorization (EUA). This EUA will remain  in effect (meaning this test can be used) for the duration of the COVID-19 declaration under Section 564(b)(1) of the Act, 21 U.S.C.section 360bbb-3(b)(1), unless the authorization is terminated  or revoked sooner.       Influenza A by PCR NEGATIVE NEGATIVE   Influenza B by PCR NEGATIVE NEGATIVE    Comment: (NOTE) The Xpert Xpress SARS-CoV-2/FLU/RSV plus assay is intended as an aid in the diagnosis of influenza from Nasopharyngeal swab specimens and should not be used as a sole basis for treatment. Nasal washings and aspirates are unacceptable for Xpert Xpress SARS-CoV-2/FLU/RSV testing.  Fact Sheet for Patients: EntrepreneurPulse.com.au  Fact Sheet for Healthcare Providers: IncredibleEmployment.be  This test is not yet approved or cleared by the [place] FDA and has been authorized for detection and/or diagnosis of SARS-CoV-2 by FDA under an Emergency Use Authorization (EUA). This EUA will remain in effect (meaning this test can be used) for the duration of the COVID-19 declaration under Section 564(b)(1) of the Act, 21 U.S.C. section 360bbb-3(b)(1), unless the authorization is terminated or revoked.  Performed at [facility], [place] [address], [place], [place] [address]   CBG  monitoring, ED     Status: Abnormal   Collection Time: 09/22/20  4:20 PM  Result Value Ref Range   Glucose-Capillary 120 (H) 70 - 99 mg/dL    Comment: Glucose reference range applies only to samples taken after fasting for at least 8 hours.  Creatinine, serum     Status: Abnormal   Collection Time: 09/22/20 10:02 PM  Result Value Ref Range   Creatinine, Ser 2.08 (H) 0.44 - 1.00 mg/dL   GFR, Estimated 25 (L) >60 mL/min    Comment: (NOTE) Calculated using the CKD-EPI Creatinine Equation (2021) Performed at [facility] [address], [place], [place] [address]   Hemoglobin A1c  Status: Abnormal   Collection Time: 09/22/20 10:02 PM  Result Value Ref Range   Hgb A1c MFr Bld 7.4 (H) 4.8 - 5.6 %    Comment: (NOTE) Pre diabetes:          5.7%-6.4%  Diabetes:              >6.4%  Glycemic control for   <7.0% adults with diabetes    Mean Plasma Glucose 165.68 mg/dL    Comment: Performed at [facility] [address], [place], [place] [address]  Glucose, capillary     Status: Abnormal   Collection Time: 09/22/20 10:32 PM  Result Value Ref Range   Glucose-Capillary 137 (H) 70 - 99 mg/dL    Comment: Glucose reference range applies only to samples taken after fasting for at least 8 hours.  Basic metabolic panel     Status: Abnormal   Collection Time: 09/23/20  1:05 AM  Result Value Ref Range   Sodium 136 135 - 145 mmol/L   Potassium 4.1 3.5 - 5.1 mmol/L   Chloride 107 98 - 111 mmol/L   CO2 26 22 - 32 mmol/L   Glucose, Bld 116 (H) 70 - 99 mg/dL    Comment: Glucose reference range applies only to samples taken after fasting for at least 8 hours.   BUN 34 (H) 8 - 23 mg/dL   Creatinine, Ser 1.96 (H) 0.44 - 1.00 mg/dL   Calcium 9.2 8.9 - 10.3 mg/dL   GFR, Estimated 27 (L) >60 mL/min    Comment: (NOTE) Calculated using the CKD-EPI Creatinine Equation (2021)    Anion gap 3 (L) 5 - 15    Comment: Performed at [facility] [address], [place], [place]  [address]  CBC with Differential/Platelet     Status: Abnormal   Collection Time: 09/23/20  1:05 AM  Result Value Ref Range   WBC 7.9 4.0 - 10.5 K/uL   RBC 3.02 (L) 3.87 - 5.11 MIL/uL   Hemoglobin 9.8 (L) 12.0 - 15.0 g/dL   HCT 30.6 (L) 36.0 - 46.0 %   MCV 101.3 (H) 80.0 - 100.0 fL   MCH 32.5 26.0 - 34.0 pg   MCHC 32.0 30.0 - 36.0 g/dL   RDW 15.9 (H) 11.5 - 15.5 %   Platelets 385 150 - 400 K/uL   nRBC 0.0 0.0 - 0.2 %   Neutrophils Relative % 64 %   Neutro Abs 5.2 1.7 - 7.7 K/uL   Lymphocytes Relative 25 %   Lymphs Abs 1.9 0.7 - 4.0 K/uL   Monocytes Relative 8 %   Monocytes Absolute 0.6 0.1 - 1.0 K/uL   Eosinophils Relative 1 %   Eosinophils Absolute 0.1 0.0 - 0.5 K/uL   Basophils Relative 1 %   Basophils Absolute 0.0 0.0 - 0.1 K/uL   Immature Granulocytes 1 %   Abs Immature Granulocytes 0.04 0.00 - 0.07 K/uL    Comment: Performed at [facility] [address] [place], [place] [address]  Glucose, capillary     Status: None   Collection Time: 09/23/20  8:18 AM  Result Value Ref Range   Glucose-Capillary 92 70 - 99 mg/dL    Comment: Glucose reference range applies only to samples taken after fasting for at least 8 hours.    CT Head Wo Contrast  Result Date: 09/22/2020 CLINICAL DATA:  Fall, on anticoagulation EXAM: CT HEAD WITHOUT CONTRAST TECHNIQUE: Contiguous axial images were obtained from the base of  the skull through the vertex without intravenous contrast. COMPARISON:  None. FINDINGS: Brain: There is no acute intracranial hemorrhage, mass effect, or edema. Gray-white differentiation is preserved. There is no extra-axial fluid collection. Prominence of the ventricles and sulci reflects generalized parenchymal volume loss patchy hypoattenuation in the supratentorial white matter is nonspecific but may reflect mild to moderate chronic microvascular ischemic changes. Vascular: There is atherosclerotic calcification at the skull base. Skull: Calvarium is unremarkable.  Sinuses/Orbits: No acute finding. Other: None. IMPRESSION: No evidence of acute intracranial injury. Electronically Signed   By: Macy Mis M.D.   On: 09/22/2020 11:46   CT Cervical Spine Wo Contrast  Result Date: 09/22/2020 CLINICAL DATA:  Fall EXAM: CT CERVICAL SPINE WITHOUT CONTRAST TECHNIQUE: Multidetector CT imaging of the cervical spine was performed without intravenous contrast. Multiplanar CT image reconstructions were also generated. COMPARISON:  01/09/2019 FINDINGS: Alignment: No significant listhesis. Skull base and vertebrae: No acute fracture. Vertebral body heights are maintained. Soft tissues and spinal canal: No prevertebral fluid or swelling. No visible canal hematoma. Disc levels: Degenerative changes are present without high-grade osseous encroachment on the spinal canal. Upper chest: No apical lung mass. Other: Calcified plaque at the common carotid bifurcations. IMPRESSION: No acute cervical spine fracture. Electronically Signed   By: Macy Mis M.D.   On: 09/22/2020 11:50   DG Chest Portable 1 View  Result Date: 09/22/2020 CLINICAL DATA:  Fall. EXAM: PORTABLE CHEST 1 VIEW COMPARISON:  09/07/2020. FINDINGS: Mediastinum and hilar structures normal. Coronary artery stents noted. Cardiomegaly. Low lung volumes mild bibasilar atelectasis. Mild bibasilar interstitial edema/infiltrates cannot be excluded. No pleural effusion or pneumothorax. No evidence of displaced rib fracture. Degenerative change thoracic spine and both shoulders. IMPRESSION: 1. Coronary artery stents noted. Cardiomegaly. No pulmonary venous congestion. 2. Low lung volumes with mild bibasilar atelectasis. Mild bibasilar interstitial edema/infiltrates cannot be excluded. 3.  No evidence of displaced rib fracture or pneumothorax. Electronically Signed   By: Marcello Moores  Register   On: 09/22/2020 11:56   DG Humerus Right  Result Date: 09/22/2020 CLINICAL DATA:  Fall. EXAM: RIGHT HUMERUS - 2+ VIEW COMPARISON:  No recent  prior. FINDINGS: Severely angulated, overriding, displaced fracture of the midportion of the right humerus noted. No evidence of shoulder dislocation noted. Degenerative changes noted about the right shoulder and elbow. IMPRESSION: Severely angulated, overriding, displaced fracture of the midportion of the right humerus. Electronically Signed   By: [place]   On: 09/22/2020 11:19    Review of Systems  Musculoskeletal:  Positive for arthralgias.  All other systems reviewed and are negative. Blood pressure (!) 151/49, pulse (!) 52, temperature 98.6 F (37 C), temperature source Oral, resp. rate 17, weight 76.4 kg, SpO2 97 %. Physical Exam Vitals reviewed.  HENT:     Head: Normocephalic.     Nose: Nose normal.     Mouth/Throat:     Mouth: Mucous membranes are moist.  Eyes:     Pupils: Pupils are equal, round, and reactive to light.  Cardiovascular:     Rate and Rhythm: Normal rate.     Pulses: Normal pulses.  Pulmonary:     Effort: Pulmonary effort is normal.  Abdominal:     General: Abdomen is flat.  Musculoskeletal:     Cervical back: Normal range of motion.  Skin:    General: Skin is warm.     Capillary Refill: Capillary refill takes less than 2 seconds.  Neurological:     General: No focal deficit present.  Mental Status: She is alert.  Psychiatric:        Mood and Affect: Mood normal.  Examination of right upper extremity demonstrates intact EPL FPL interosseous function with intact perfusion and sensation to the hand.  Patient is in a shoulder spica.  Describes some low back pain.  Ankle dorsiflexion plantarflexion intact bilaterally.  Assessment/Plan: Impression is long oblique humeral shaft fracture in a patient who had stent placed for cardiac issues in April of this year.  She is on blood thinners.  This is the type of fracture which can heal with nonoperative management.  The patient does use a walker for ambulation pending back surgery.  I think would be  helpful for her to get some cardiac restratification while she is here in the hospital.  The question at hand is whether or not the risk and stress of surgical fixation of the right arm outweighs the benefit of easier mobilization.  Inherently this fracture should be stable within 2 to 3 weeks and we will change her to a Sarmiento brace at that time.  Landry Dyke Artavius Stearns 09/23/2020, 8:37 AM

## 2020-09-23 NOTE — TOC Initial Note (Signed)
Transition of Care [facility]) - Initial/Assessment Note    Patient Details  Name: Anna [name] MRN: [id number] Date of Birth: 1950/10/25  Transition of Care [facility]) CM/SW Contact:    Marilu Favre, RN Phone Number: 09/23/2020, 4:11 PM  Clinical Narrative:                 Spoke to patient and her spouse Dorothyann Peng at bedside.   Discussed PT / OT recommendation for SNF. Patient voiced understanding but declined. Patient prefers to go home with home health services.   Provided medicare.gov list of home health agencies. Patient has had [facility] in the past and would like them again.   PT recommending ambulance transport home at discharge. Patient and spouse in agreement. Confirmed address and phone number.   PT also recommending wheel chair. NCM explained ambulance will not transport wheel chair to home. [facility] can deliver wheel chair to hospital room and husband take it home or NCM can request [facility] to deliver wheel chair to home. Patient and spouse prefer to have wheel chair delivered to home. Again NCM confirmed phone number for [facility] to contact them for delivery.    Wheel chair ordered with Freda Munro with [facility] , requested delivery to home.   Ramond Marrow with [facility] accepted referral for HHRN,PT,OT,aide and SW  Expected Discharge Plan: [place]     Patient Goals and CMS Choice Patient states their goals for this hospitalization and ongoing recovery are:: to return to home CMS Medicare.gov Compare Post Acute Care list provided to:: Patient Choice offered to / list presented to : Patient  Expected Discharge Plan and Services Expected Discharge Plan: [place]   Discharge Planning Services: CM Consult Post Acute Care Choice: Home Health, Durable Medical Equipment Living arrangements for the past 2 months: Single Family Home                 DME Arranged: Wheelchair manual DME Agency: [facility] Date DME Agency Contacted:  09/23/20 Time DME Agency Contacted: [id number] Representative spoke with at DME Agency: Freda Munro HH Arranged: RN, PT, OT, Nurse's Aide, Social Work [employer] Agency: [place] ([place]) Date [place]: 09/23/20 Time [place]: 15 Representative spoke with at [place]: [place] Arrangements/Services Living arrangements for the past 2 months: [place] Lives with:: Spouse Patient language and need for interpreter reviewed:: Yes Do you feel safe going back to the place where you live?: Yes      Need for Family Participation in Patient Care: Yes (Comment) Care giver support system in place?: Yes (comment) Current home services: DME Criminal Activity/Legal Involvement Pertinent to Current Situation/Hospitalization: No - Comment as needed  Activities of Daily Living      Permission Sought/Granted   Permission granted to share information with : Yes, Verbal Permission Granted  Share Information with NAME: Aleria Maheu spouse [phone]  Permission granted to share info w AGENCY: [place]        Emotional Assessment Appearance:: Appears stated age Attitude/Demeanor/Rapport: Engaged Affect (typically observed): Accepting Orientation: : Oriented to Self, Oriented to Place, Oriented to  Time, Oriented to Situation Alcohol / Substance Use: Not Applicable Psych Involvement: No (comment)  Admission diagnosis:  Closed right humeral fracture [S42.301A] Closed displaced oblique fracture of shaft of right humerus, initial encounter [S42.331A] Fall, initial encounter [W19.XXXA] Patient Active Problem List   Diagnosis Date Noted   Type 2 diabetes mellitus without complication,  with long-term current use of insulin ([place]) 09/22/2020   Hyperlipidemia 09/22/2020   Closed right humeral fracture 09/22/2020   HTN (hypertension) 09/22/2020   Acute combined systolic and diastolic congestive heart failure ([place]) 09/22/2020   CAD (coronary  artery disease) 09/22/2020   CKD (chronic kidney disease), stage III ([place]) 09/22/2020   Thrombocytosis 09/22/2020   Chronic back pain 09/22/2020   RLS (restless legs syndrome) 09/22/2020   PCP:  Leanna Battles, MD Pharmacy:   [facility] [place] - [address] [place] ([place]) [place] [address] Phone: [phone] Fax: [phone]     Social Determinants of Health (SDOH) Interventions    Readmission Risk Interventions No flowsheet data found.

## 2020-09-23 NOTE — Evaluation (Signed)
Occupational Therapy Evaluation Patient Details Name: Anna [name] MRN: [id number] DOB: [birth year] Today's Date: 09/23/2020    History of Present Illness Pt is a 70 yr old female who fell on a rug at home and s/p  acute right midshaft humerus fracture. PMH: Combined systolic and diastolic heart failure, CAD s/p DES to LAD and OM2 07/27/20, HTN, DM2, CKD IIIb, HLD, peripheral neuropathy, chronic back pain, artheritis, essential thrombocytosis   Clinical Impression   Pt admitted with s/p acute right midshaft humerus fracture due to falling on her rug at home and not using her walker at the time. Pt lives with her husband in a home with 4 steps to enter. Pt required increase time and min assistance for supine to sitting and sitting to standing. Pt required hand held assist to stand and take two steps to chair on L side. Due to decrease ROM and pain pt requires moderate assist with UE/LE ADLS at this time.  Pt currently with functional limitations due to the deficits listed below (see OT Problem List).  Pt will benefit from skilled OT to increase their safety and independence with ADL and functional mobility for ADL to facilitate discharge to venue listed below.      Follow Up Recommendations  SNF;Supervision/Assistance - 24 hour    Equipment Recommendations       Recommendations for Other Services       Precautions / Restrictions Precautions Precautions: Fall;Shoulder Type of Shoulder Precautions: NWB to RUE Shoulder Interventions: Shoulder sling/immobilizer Restrictions Weight Bearing Restrictions: Yes RUE Weight Bearing: Non weight bearing      Mobility Bed Mobility Overal bed mobility: Needs Assistance Bed Mobility: Supine to Sit     Supine to sit: Min assist;HOB elevated     General bed mobility comments: pt requires cues on placement    Transfers Overall transfer level: Needs assistance Equipment used: 1 person hand held assist Transfers: Sit to/from Stand Sit to  Stand: Min assist;From elevated surface         General transfer comment: pt lead with L side to chair    Balance                                           ADL either performed or assessed with clinical judgement   ADL Overall ADL's : Needs assistance/impaired Eating/Feeding: Set up;Bed level   Grooming: Wash/dry hands;Wash/dry face;Bed level;Set up;Cueing for safety;Cueing for sequencing   Upper Body Bathing: Moderate assistance;Cueing for safety;Cueing for sequencing;Sitting;Standing   Lower Body Bathing: Moderate assistance;Cueing for safety;Cueing for sequencing;Sit to/from stand   Upper Body Dressing : Moderate assistance;Cueing for safety;Cueing for sequencing;Sitting;Standing   Lower Body Dressing: Moderate assistance;Cueing for safety;Cueing for sequencing;Sit to/from stand   Toilet Transfer: Minimal assistance;BSC;Cueing for safety;Cueing for sequencing             General ADL Comments: Pt was very fearful of movements and pain in RUE     Vision Baseline Vision/History:  (wears contacts) Patient Visual Report: No change from baseline Vision Assessment?: No apparent visual deficits     Perception     Praxis      Pertinent Vitals/Pain Pain Assessment: 0-10 Pain Score: 8  Pain Descriptors / Indicators: Aching Pain Intervention(s): Limited activity within patient's tolerance;Patient requesting pain meds-RN notified     Hand Dominance Right   Extremity/Trunk Assessment Upper Extremity Assessment Upper Extremity Assessment: RUE deficits/detail  RUE Deficits / Details: s/p fx RUE: Unable to fully assess due to pain RUE Sensation: WNL   Lower Extremity Assessment Lower Extremity Assessment: Defer to PT evaluation       Communication Communication Communication: No difficulties   Cognition Arousal/Alertness: Awake/alert Behavior During Therapy: WFL for tasks assessed/performed Overall Cognitive Status: Within Functional Limits  for tasks assessed                                     General Comments       Exercises     Shoulder Instructions      Home Living Family/patient expects to be discharged to:: Private residence Living Arrangements: Spouse/significant other   Type of Home: House Home Access: Stairs to enter Technical brewer of Steps: 4 Entrance Stairs-Rails: Left Home Layout: One level     Bathroom Shower/Tub: Occupational psychologist: Standard Bathroom Accessibility: Yes How Accessible: Accessible via walker Home Equipment: [place] - 2 wheels;Shower seat;Grab bars - tub/shower;Cane - single point          Prior Functioning/Environment Level of Independence: Independent with assistive device(s)        Comments: Pt reports walker at all times but when they fell they did not have walker        OT Problem List: Decreased activity tolerance;Impaired balance (sitting and/or standing);Decreased range of motion;Decreased strength;Decreased safety awareness;Decreased knowledge of use of DME or AE;Pain      OT Treatment/Interventions: Self-care/ADL training;Therapeutic exercise;DME and/or AE instruction;Therapeutic activities;Patient/family education;Balance training    OT Goals(Current goals can be found in the care plan section) Acute Rehab OT Goals Patient Stated Goal: to be able to be comfortable OT Goal Formulation: With patient Time For Goal Achievement: 10/08/20 Potential to Achieve Goals: Good ADL Goals Pt Will Perform Upper Body Bathing: with set-up;sitting Pt Will Perform Lower Body Bathing: with set-up;sit to/from stand Pt Will Transfer to Toilet: with min guard assist;ambulating;regular height toilet;grab bars Pt Will Perform Tub/Shower Transfer: with min guard assist;shower seat  OT Frequency: Min 2X/week   Barriers to D/C: Decreased caregiver support          Co-evaluation              AM-PAC OT "6 Clicks" Daily Activity      Outcome Measure Help from another person eating meals?: A Little Help from another person taking care of personal grooming?: A Little Help from another person toileting, which includes using toliet, bedpan, or urinal?: A Lot Help from another person bathing (including washing, rinsing, drying)?: A Lot Help from another person to put on and taking off regular upper body clothing?: A Lot Help from another person to put on and taking off regular lower body clothing?: A Lot 6 Click Score: 14   End of Session Equipment Utilized During Treatment: Gait belt Nurse Communication: Mobility status  Activity Tolerance: Patient limited by pain Patient left: in chair;with chair alarm set;with call bell/phone within reach;with family/visitor present  OT Visit Diagnosis: Unsteadiness on feet (R26.81);Other abnormalities of gait and mobility (R26.89);History of falling (Z91.81);Pain Pain - Right/Left: Right Pain - part of body: Shoulder                Time: 6226-3335 OT Time Calculation (min): 53 min Charges:  OT General Charges $OT Visit: 1 Visit OT Evaluation $OT Eval Low Complexity: 1 Low OT Treatments $Self Care/Home Management : 38-52 mins  Joeseph Amor OTR/L  Acute Rehab Services  [phone] office number [phone] pager number   Joeseph Amor 09/23/2020, 12:57 PM

## 2020-09-24 ENCOUNTER — Ambulatory Visit: Payer: Medicare Other | Admitting: Cardiovascular Disease

## 2020-09-24 DIAGNOSIS — E119 Type 2 diabetes mellitus without complications: Secondary | ICD-10-CM

## 2020-09-24 DIAGNOSIS — G2581 Restless legs syndrome: Secondary | ICD-10-CM

## 2020-09-24 DIAGNOSIS — Z794 Long term (current) use of insulin: Secondary | ICD-10-CM

## 2020-09-24 DIAGNOSIS — N1831 Chronic kidney disease, stage 3a: Secondary | ICD-10-CM

## 2020-09-24 DIAGNOSIS — E785 Hyperlipidemia, unspecified: Secondary | ICD-10-CM

## 2020-09-24 LAB — BASIC METABOLIC PANEL
Anion gap: 5 (ref 5–15)
BUN: 29 mg/dL — ABNORMAL HIGH (ref 8–23)
CO2: 27 mmol/L (ref 22–32)
Calcium: 8.8 mg/dL — ABNORMAL LOW (ref 8.9–10.3)
Chloride: 105 mmol/L (ref 98–111)
Creatinine, Ser: 1.61 mg/dL — ABNORMAL HIGH (ref 0.44–1.00)
GFR, Estimated: 34 mL/min — ABNORMAL LOW (ref >60)
Glucose, Bld: 163 mg/dL — ABNORMAL HIGH (ref 70–99)
Potassium: 3.8 mmol/L (ref 3.5–5.1)
Sodium: 137 mmol/L (ref 135–145)

## 2020-09-24 LAB — GLUCOSE, CAPILLARY
Glucose-Capillary: 131 mg/dL — ABNORMAL HIGH (ref 70–99)
Glucose-Capillary: 112 mg/dL — ABNORMAL HIGH (ref 70–99)
Glucose-Capillary: 93 mg/dL (ref 70–99)
Glucose-Capillary: 176 mg/dL — ABNORMAL HIGH (ref 70–99)

## 2020-09-24 MED ORDER — OXYCODONE HCL 5 MG PO TABS
5.0000 mg | ORAL_TABLET | ORAL | Status: DC | PRN
  Administered 2020-09-24 – 2020-09-25 (×6): 10 mg via ORAL
  Filled 2020-09-24 (×6): qty 2

## 2020-09-24 NOTE — Progress Notes (Signed)
Occupational Therapy Treatment Patient Details Name: Anna [name] MRN: [id number] DOB: Jun 19, 1950 Today's Date: 09/24/2020    History of present illness Pt is a 70 yr old female who fell on a rug at home and s/p  acute right midshaft humerus fracture. PMH: Combined systolic and diastolic heart failure, CAD s/p DES to LAD and OM2 07/27/20, HTN, DM2, CKD IIIb, HLD, peripheral neuropathy, chronic back pain, artheritis, essential thrombocytosis   OT comments  Pt making incremental progress with OT goals this session. Pt limited by pain, however able to tolerate minimal OOB activities and wrist/hand ROM exercises. Pt and husband educated on shoulder precautions and exercises that can be completed, as well as positioning for comfort while maintaining shoulder stability. Acute OT will continue to follow to address all concerns with ADL's prior to discharge.    Follow Up Recommendations  Home health OT;Follow surgeon's recommendation for DC plan and follow-up therapies    Equipment Recommendations  None recommended by OT    Recommendations for Other Services      Precautions / Restrictions Precautions Precautions: Fall;Shoulder Type of Shoulder Precautions: NWB to RUE Shoulder Interventions: Shoulder sling/immobilizer Restrictions Weight Bearing Restrictions: Yes RUE Weight Bearing: Non weight bearing       Mobility Bed Mobility Overal bed mobility: Needs Assistance Bed Mobility: Supine to Sit;Sit to Supine     Supine to sit: Min assist;HOB elevated Sit to supine: Min assist   General bed mobility comments: ASsist with trunkand BLE    Transfers Overall transfer level: Needs assistance Equipment used: 1 person hand held assist Transfers: Sit to/from Stand Sit to Stand: Min assist;From elevated surface         General transfer comment: Min A to power up    Balance Overall balance assessment: Needs assistance Sitting-balance support: No upper extremity supported;Feet  supported Sitting balance-Leahy Scale: Good     Standing balance support: Single extremity supported Standing balance-Leahy Scale: Poor Standing balance comment: UE support and min assist for static standing                           ADL either performed or assessed with clinical judgement   ADL Overall ADL's : Needs assistance/impaired Eating/Feeding: Independent;Sitting   Grooming: Wash/dry hands;Wash/dry face;Set up;Sitting           Upper Body Dressing : Moderate assistance;Sitting       Toilet Transfer: Minimal assistance;Stand-pivot           Functional mobility during ADLs: Minimal assistance General ADL Comments: Pt requiring HHA for OOB mobility. In bed pt using LUE for all ADL's despite being R handed     Vision       Perception     Praxis      Cognition Arousal/Alertness: Awake/alert Behavior During Therapy: WFL for tasks assessed/performed Overall Cognitive Status: Within Functional Limits for tasks assessed                                          Exercises Exercises: Shoulder Shoulder Exercises Wrist Flexion: AAROM;5 reps;Right Wrist Extension: AAROM;Right;5 reps Digit Composite Flexion: AROM;5 reps   Shoulder Instructions Shoulder Instructions Donning/doffing shirt without moving shoulder: Moderate assistance;Maximal assistance;Patient able to independently direct caregiver Donning/doffing sling/immobilizer: Maximal assistance;Patient able to independently direct caregiver Correct positioning of sling/immobilizer: Independent Positioning of UE while sleeping: Supervision/safety  General Comments VSS on RA    Pertinent Vitals/ Pain       Pain Assessment: 0-10 Pain Score: 7  Pain Location: RUE Pain Descriptors / Indicators: Aching;Burning;Constant Pain Intervention(s): Limited activity within patient's tolerance;Monitored during session;Repositioned  Home Living                                           Prior Functioning/Environment              Frequency  Min 2X/week        Progress Toward Goals  OT Goals(current goals can now be found in the care plan section)  Progress towards OT goals: Progressing toward goals  Acute Rehab OT Goals Patient Stated Goal: return home OT Goal Formulation: With patient Time For Goal Achievement: 10/08/20 Potential to Achieve Goals: Good ADL Goals Pt Will Perform Upper Body Bathing: with set-up;sitting Pt Will Perform Lower Body Bathing: with set-up;sit to/from stand Pt Will Transfer to Toilet: with min guard assist;ambulating;regular height toilet;grab bars Pt Will Perform Tub/Shower Transfer: with min guard assist;shower seat  Plan Discharge plan remains appropriate;Frequency remains appropriate    Co-evaluation                 AM-PAC OT "6 Clicks" Daily Activity     Outcome Measure   Help from another person eating meals?: None Help from another person taking care of personal grooming?: A Little Help from another person toileting, which includes using toliet, bedpan, or urinal?: A Lot Help from another person bathing (including washing, rinsing, drying)?: A Lot Help from another person to put on and taking off regular upper body clothing?: A Lot Help from another person to put on and taking off regular lower body clothing?: A Lot 6 Click Score: 15    End of Session Equipment Utilized During Treatment: Gait belt  OT Visit Diagnosis: Unsteadiness on feet (R26.81);Other abnormalities of gait and mobility (R26.89);History of falling (Z91.81);Pain Pain - Right/Left: Right Pain - part of body: Shoulder   Activity Tolerance Patient limited by pain   Patient Left in bed;with call bell/phone within reach;with family/visitor present   Nurse Communication Mobility status        Time: 5732-2025 OT Time Calculation (min): 24 min  Charges: OT General Charges $OT Visit: 1 Visit OT Treatments $Self  Care/Home Management : 8-22 mins $Therapeutic Activity: 8-22 mins  Brandice Busser H., OTR/L Acute Rehabilitation  [place] Elane Kimaria Struthers 09/24/2020, 6:11 PM

## 2020-09-24 NOTE — Progress Notes (Addendum)
PROGRESS NOTE    Anna [name]  KXF:[id number] DOB: [birth year] DOA: 09/22/2020 PCP: Leanna Battles, MD   Brief Narrative: Anna [name] is a 70 y.o. female with a history of diabetes, CKD stage IIIa, heart failure, CAD with recent PCI/stent now on DAPT, neuropathy, hyperlipidemia, chronic pain, restless leg syndrome. Patient presented secondary to fall and suffered a right humerus fracture. Orthopedic surgery consulted and recommend conservative management.   Assessment & Plan:   Principal Problem:   Closed right humeral fracture Active Problems:   Type 2 diabetes mellitus without complication, with long-term current use of insulin (HCC)   Hyperlipidemia   HTN (hypertension)   CAD (coronary artery disease)   CKD (chronic kidney disease), stage III (HCC)   Thrombocytosis   Chronic back pain   RLS (restless legs syndrome)   Right humeral fracture Secondary to fall. Orthopedic surgery consulted with recommendation for non-operative management. Splint applied. Plan for outpatient follow-up and consideration for possible operative management vs continued conservative management.  CAD Recent heart cath, currently on DAPT with Brilinta and aspirin. Stable. -Continue Brilinta 90 mg BID and aspirin 81 mg daily  History of back pain/right hip pain -Continue Lyrica  Chronic combined systolic and diastolic heart failure Stable. EF of 35%  Hyperlipidemia -Continue fenofibrate and Lipitor  Diabetes mellitus, type 2 -Continue Lantus and SSI  AKI on CKD stage IIIa Baseline creatinine of 1.3. Peak creatinine of 2.08, now improving.  Bilateral knee pain Pain management as above.    DVT prophylaxis: Lovenox Code Status:   Code Status: Full Code Family Communication: Husband at bedside Disposition Plan: Discharge home likely in 24 hours pending PT/OT recommendations. Patient declining SNF.   Consultants:  Orthopedic surgery  Procedures:  Non  Antimicrobials: None     Subjective: Some arm pain. Declining SNF. Husband concerned about mobility at home.  Objective: Vitals:   09/24/20 0526 09/24/20 0732 09/24/20 1147 09/24/20 1531  BP: (!) 166/47 (!) 156/57 (!) 155/51 (!) 153/55  Pulse: 61 60 (!) 59 61  Resp: 17 19 18 18   Temp: (!) 97.5 F (36.4 C) 98.3 F (36.8 C) (!) 97.3 F (36.3 C) 97.9 F (36.6 C)  TempSrc: Oral Oral Oral Oral  SpO2: 98% 97% 100% 100%  Weight:      Height:        Intake/Output Summary (Last 24 hours) at 09/24/2020 2000 Last data filed at 09/24/2020 0559 Gross per 24 hour  Intake --  Output 900 ml  Net -900 ml   Filed Weights   09/22/20 2250  Weight: 76.4 kg    Examination:  General exam: Appears calm and comfortable  Respiratory system: Clear to auscultation. Respiratory effort normal. Cardiovascular system: S1 & S2 heard, RRR. No murmurs, rubs, gallops or clicks. Gastrointestinal system: Abdomen is nondistended, soft and nontender. No organomegaly or masses felt. Normal bowel sounds heard. Central nervous system: Alert and oriented. No focal neurological deficits. Musculoskeletal: No edema. No calf tenderness. Right arm in cast Skin: No cyanosis. No rashes Psychiatry: Judgement and insight appear normal. Mood & affect appropriate.     Data Reviewed: I have personally reviewed following labs and imaging studies  CBC Lab Results  Component Value Date   WBC 7.9 09/23/2020   RBC 3.02 (L) 09/23/2020   HGB 9.8 (L) 09/23/2020   HCT 30.6 (L) 09/23/2020   MCV 101.3 (H) 09/23/2020   MCH 32.5 09/23/2020   PLT 385 09/23/2020   MCHC 32.0 09/23/2020   RDW 15.9 (H) 09/23/2020  LYMPHSABS 1.9 09/23/2020   MONOABS 0.6 09/23/2020   EOSABS 0.1 09/23/2020   BASOSABS 0.0 50/35/4656     Last metabolic panel Lab Results  Component Value Date   NA 137 09/24/2020   K 3.8 09/24/2020   CL 105 09/24/2020   CO2 27 09/24/2020   BUN 29 (H) 09/24/2020   CREATININE 1.61 (H) 09/24/2020   GLUCOSE 163 (H) 09/24/2020    GFRNONAA 34 (L) 09/24/2020   CALCIUM 8.8 (L) 09/24/2020   PROT 6.1 (L) 09/22/2020   ALBUMIN 2.9 (L) 09/22/2020   BILITOT 0.6 09/22/2020   ALKPHOS 72 09/22/2020   AST 30 09/22/2020   ALT 24 09/22/2020   ANIONGAP 5 09/24/2020    CBG (last 3)  Recent Labs    09/24/20 0731 09/24/20 1144 09/24/20 1658  GLUCAP 131* 112* 93     GFR: Estimated Creatinine Clearance: 32.3 mL/min (A) (by C-G formula based on SCr of 1.61 mg/dL (H)).  Coagulation Profile: Recent Labs  Lab 09/22/20 1046  INR 1.0    Recent Results (from the past 240 hour(s))  Resp Panel by RT-PCR (Flu A&B, Covid) Nasopharyngeal Swab     Status: None   Collection Time: 09/22/20 11:28 AM   Specimen: Nasopharyngeal Swab; Nasopharyngeal(NP) swabs in vial transport medium  Result Value Ref Range Status   SARS Coronavirus 2 by RT PCR NEGATIVE NEGATIVE Final    Comment: (NOTE) SARS-CoV-2 target nucleic acids are NOT DETECTED.  The SARS-CoV-2 RNA is generally detectable in upper respiratory specimens during the acute phase of infection. The lowest concentration of SARS-CoV-2 viral copies this assay can detect is 138 copies/mL. A negative result does not preclude SARS-Cov-2 infection and should not be used as the sole basis for treatment or other patient management decisions. A negative result may occur with  improper specimen collection/handling, submission of specimen other than nasopharyngeal swab, presence of viral mutation(s) within the areas targeted by this assay, and inadequate number of viral copies(<138 copies/mL). A negative result must be combined with clinical observations, patient history, and epidemiological information. The expected result is Negative.  Fact Sheet for Patients:  EntrepreneurPulse.com.au  Fact Sheet for Healthcare Providers:  IncredibleEmployment.be  This test is no t yet approved or cleared by the [place] FDA and  has been authorized for  detection and/or diagnosis of SARS-CoV-2 by FDA under an Emergency Use Authorization (EUA). This EUA will remain  in effect (meaning this test can be used) for the duration of the COVID-19 declaration under Section 564(b)(1) of the Act, 21 U.S.C.section 360bbb-3(b)(1), unless the authorization is terminated  or revoked sooner.       Influenza A by PCR NEGATIVE NEGATIVE Final   Influenza B by PCR NEGATIVE NEGATIVE Final    Comment: (NOTE) The Xpert Xpress SARS-CoV-2/FLU/RSV plus assay is intended as an aid in the diagnosis of influenza from Nasopharyngeal swab specimens and should not be used as a sole basis for treatment. Nasal washings and aspirates are unacceptable for Xpert Xpress SARS-CoV-2/FLU/RSV testing.  Fact Sheet for Patients: EntrepreneurPulse.com.au  Fact Sheet for Healthcare Providers: IncredibleEmployment.be  This test is not yet approved or cleared by the [place] FDA and has been authorized for detection and/or diagnosis of SARS-CoV-2 by FDA under an Emergency Use Authorization (EUA). This EUA will remain in effect (meaning this test can be used) for the duration of the COVID-19 declaration under Section 564(b)(1) of the Act, 21 U.S.C. section 360bbb-3(b)(1), unless the authorization is terminated or revoked.  Performed at [facility], [place] [address] [place], [place] [address]         Radiology Studies: DG Knee 1-2 Views Left  Result Date: 09/23/2020 CLINICAL DATA:  Fall, BILATERAL knee pain EXAM: LEFT KNEE - 1-2 VIEW COMPARISON:  None FINDINGS: Osseous demineralization. Joint spaces preserved. No acute fracture, dislocation, or bone destruction. Small anterior soft tissue swelling infrapatellar. No knee joint effusion. Small patellar spurs noted at quadriceps and patellar tendon insertions. Atherosclerotic calcifications noted. IMPRESSION: No acute LEFT knee abnormalities. Electronically Signed   By: Lavonia Dana M.D.   On: 09/23/2020 14:12   DG Knee 1-2 Views Right  Result Date: 09/23/2020 CLINICAL DATA:  BILATERAL knee pain post fall EXAM: RIGHT KNEE - 1-2 VIEW COMPARISON:  None FINDINGS: Osseous demineralization. Medial compartment joint space narrowing. Patellar spur at quadriceps tendon insertion. No acute fracture, dislocation, or bone destruction. Soft tissue swelling anteriorly and medially at RIGHT knee. No joint effusion. IMPRESSION: Osseous demineralization with degenerative changes RIGHT knee. No acute osseous abnormalities. Electronically Signed   By: Lavonia Dana M.D.   On: 09/23/2020 14:13        Scheduled Meds:  aspirin EC  81 mg Oral Daily   atorvastatin  40 mg Oral Daily   enoxaparin (LOVENOX) injection  40 mg Subcutaneous Q24H   fenofibrate  160 mg Oral Daily   hydroxyurea  500 mg Oral Daily   insulin aspart  0-9 Units Subcutaneous TID WC   insulin glargine  15 Units Subcutaneous Daily   magnesium oxide  200 mg Oral Daily   methocarbamol  500 mg Oral TID   pregabalin  50 mg Oral BID   rOPINIRole  0.5 mg Oral BID   sodium chloride flush  3 mL Intravenous Q12H   ticagrelor  90 mg Oral BID   Continuous Infusions:  sodium chloride       LOS: 2 days     Cordelia Poche, MD [facility] 09/24/2020, 8:00 PM  If 7PM-7AM, please contact night-coverage [email or web address]

## 2020-09-24 NOTE — Progress Notes (Addendum)
PT Cancellation Note  Patient Details Name: Anna [name] MRN: [id number] DOB: Sep 20, 1950   Cancelled Treatment:    Reason Eval/Treat Not Completed: Fatigue/lethargy limiting ability to participate. Pt adamantly refusing participation in therapy. When encouragement provided, pt becoming agitated. Pt stating nurse just gave her medicine to make her sleep because she is having surgery. Pt educated that she is not having surgery, she is having a splint applied to RUE and that nurse gave her pain medicine. Pt states "whatever. This is the first time I've been able to rest since 3:30 this morning. I'm not getting up right now." PT to re-attempt as time allows.   Lorriane Shire 09/24/2020, 11:21 AM  Lorrin Goodell, PT  Office # [phone] Pager [phone]

## 2020-09-25 LAB — GLUCOSE, CAPILLARY: Glucose-Capillary: 93 mg/dL (ref 70–99)

## 2020-09-25 MED ORDER — METHOCARBAMOL 500 MG PO TABS: 500.0000 mg | ORAL_TABLET | Freq: Three times a day (TID) | ORAL | 0 refills | Status: DC | PRN

## 2020-09-25 MED ORDER — SENNOSIDES-DOCUSATE SODIUM 8.6-50 MG PO TABS: 1.0000 | ORAL_TABLET | Freq: Every evening | ORAL | 0 refills | Status: DC | PRN

## 2020-09-25 MED ORDER — OXYCODONE HCL 5 MG PO TABS: 10.0000 mg | ORAL_TABLET | Freq: Four times a day (QID) | ORAL | 0 refills | Status: AC | PRN

## 2020-09-25 NOTE — Plan of Care (Signed)

## 2020-09-25 NOTE — Progress Notes (Signed)
Physical Therapy Treatment Patient Details Name: Anna [name] MRN: [id number] DOB: Sep 29, 1950 Today's Date: 09/25/2020    History of Present Illness Pt is a 70 yr old female who fell on a rug at home and s/p  acute right midshaft humerus fracture. PMH: Combined systolic and diastolic heart failure, CAD s/p DES to LAD and OM2 07/27/20, HTN, DM2, CKD IIIb, HLD, peripheral neuropathy, chronic back pain, artheritis, essential thrombocytosis    PT Comments    Pt was not interested in practicing mobility today however pt and spouse had questions regarding mobility and DME for home.  I spoke with Wendi with [facility] team and she was ordering WC and bedside commode per therapy recommendations.  I instructed pt and husband in how to use gait belt and a cloth one was supplied to them. Also discussed/demonstrated safe transfers (again pt did not want to attempt).  Discussed options with both about getting into the house via [facility] or having ambulance transport home.  They prefer ambulance transport and I feel that is definitely the safer choice.   Pt was appreciative of the discharge planning assistance and did not have any further questions or concerns for PT at this time.     Follow Up Recommendations        Equipment Recommendations  Wheelchair (measurements PT);3in1 (PT)    Recommendations for Other Services       Precautions / Restrictions Precautions Precautions: Fall;Shoulder Type of Shoulder Precautions: NWB to RUE Shoulder Interventions: Shoulder sling/immobilizer Restrictions Weight Bearing Restrictions: Yes RUE Weight Bearing: Non weight bearing    Mobility  Bed Mobility Overal bed mobility: Needs Assistance Bed Mobility: Supine to Sit;Sit to Supine     Supine to sit: Min guard;HOB elevated Sit to supine: Min assist   General bed mobility comments: Pt refused all mobility    Transfers Overall transfer level: Needs assistance Equipment used: Rolling walker (2 wheeled) Transfers:  Sit to/from Stand Sit to Stand: Mod assist;From elevated surface         General transfer comment: Mod A to power up and steaedy  Ambulation/Gait                 Stairs             Wheelchair Mobility    Modified Rankin (Stroke Patients Only)       Balance Overall balance assessment: Needs assistance Sitting-balance support: No upper extremity supported;Feet supported Sitting balance-Leahy Scale: Good     Standing balance support: Single extremity supported Standing balance-Leahy Scale: Poor Standing balance comment: UE support and mod assist for static standing                            Cognition Arousal/Alertness: Awake/alert Behavior During Therapy: WFL for tasks assessed/performed Overall Cognitive Status: Within Functional Limits for tasks assessed                                        Exercises Shoulder Exercises Wrist Flexion: AAROM;5 reps;Right Wrist Extension: AAROM;Right;5 reps Digit Composite Flexion: AROM;5 reps Donning/doffing shirt without moving shoulder: Moderate assistance;Maximal assistance;Patient able to independently direct caregiver Donning/doffing sling/immobilizer: Maximal assistance;Patient able to independently direct caregiver Correct positioning of sling/immobilizer: Independent Positioning of UE while sleeping: Supervision/safety    General Comments General comments (skin integrity, edema, etc.): VSS on RA      Pertinent  Vitals/Pain Pain Assessment:  (Did not assess as pt did not mobilize. She did call for pain meds at end of session) Pain Score: 8  Pain Location: RUE Pain Descriptors / Indicators: Aching;Burning;Constant Pain Intervention(s): Limited activity within patient's tolerance;Monitored during session;Repositioned    Home Living Family/patient expects to be discharged to:: Private residence Living Arrangements: Spouse/significant other                  Prior Function             PT Goals (current goals can now be found in the care plan section) Acute Rehab PT Goals Patient Stated Goal: return home    Frequency           PT Plan        Recommend HHPT as pt is not intersted in follow up at a rehab facility.        Co-evaluation              AM-PAC PT "6 Clicks" Mobility   Outcome Measure                   End of Session     Patient left: in bed;with call bell/phone within reach;with bed alarm set;with family/visitor present Nurse Communication:  (Secure chatted with Wendi from [facility] regarding pt and husband's requests)       Time: 9381-8299 PT Time Calculation (min) (ACUTE ONLY): 34 min  Charges:  $Self Care/Home Management: [name], [name]  Pager [phone] Office [phone] 09/25/2020    Melvern Banker 09/25/2020, 2:29 PM

## 2020-09-25 NOTE — Progress Notes (Signed)
Spoke with Dr. Louanne Skye, explained patient and spouse have questions, spouse cell phone number given and he will give them a call.

## 2020-09-25 NOTE — Progress Notes (Addendum)
Occupational Therapy Treatment Patient Details Name: Anna [name] MRN: [id number] DOB: 01-Jun-1950 Today's Date: 09/25/2020    History of present illness Pt is a 70 yr old female who fell on a rug at home and s/p  acute right midshaft humerus fracture. PMH: Combined systolic and diastolic heart failure, CAD s/p DES to LAD and OM2 07/27/20, HTN, DM2, CKD IIIb, HLD, peripheral neuropathy, chronic back pain, artheritis, essential thrombocytosis   OT comments  Pt making incremental progress with OT this session. Further education and demonstration of dressing and bathing compensatory techniques. Additionally, educated pt and husband on mobility options once returning home, due to pt's instability with a cane and inability to bear any weight through her RUE. Pt requires mod A along with steadying on the middle of a RW to maintain standing at EOB and take a few side steps. Pt and family continue to only want to return home, acute OT will continue to follow up.    Follow Up Recommendations  Home health OT;Follow surgeon's recommendation for DC plan and follow-up therapies    Equipment Recommendations  3 in 1 commode (OT)    Recommendations for Other Services      Precautions / Restrictions Precautions Precautions: Fall;Shoulder Type of Shoulder Precautions: NWB to RUE Shoulder Interventions: Shoulder sling/immobilizer Restrictions Weight Bearing Restrictions: Yes RUE Weight Bearing: Non weight bearing       Mobility Bed Mobility Overal bed mobility: Needs Assistance Bed Mobility: Supine to Sit;Sit to Supine     Supine to sit: Min guard;HOB elevated Sit to supine: Min assist   General bed mobility comments: ASsist with trunkand BLE    Transfers Overall transfer level: Needs assistance Equipment used: Rolling walker (2 wheeled) Transfers: Sit to/from Stand Sit to Stand: Mod assist;From elevated surface         General transfer comment: Mod A to power up and steaedy     Balance Overall balance assessment: Needs assistance Sitting-balance support: No upper extremity supported;Feet supported Sitting balance-Leahy Scale: Good     Standing balance support: Single extremity supported Standing balance-Leahy Scale: Poor Standing balance comment: UE support and mod assist for static standing                           ADL either performed or assessed with clinical judgement   ADL Overall ADL's : Needs assistance/impaired Eating/Feeding: Independent;Sitting   Grooming: Wash/dry hands;Wash/dry face;Set up;Sitting   Upper Body Bathing: Moderate assistance;Cueing for compensatory techniques;Sitting   Lower Body Bathing: Moderate assistance;Cueing for compensatory techniques;Cueing for safety;Sitting/lateral leans   Upper Body Dressing : Moderate assistance;Cueing for UE precautions;Cueing for compensatory techniques;Sitting   Lower Body Dressing: Moderate assistance;Maximal assistance;Cueing for compensatory techniques;Sitting/lateral leans   Toilet Transfer: Moderate assistance;Stand-pivot   Toileting- Clothing Manipulation and Hygiene: Moderate assistance;Sitting/lateral lean       Functional mobility during ADLs: Moderate assistance;Rolling walker General ADL Comments: Pt using an elevated RW, where she pushes down in the middle with her LUE to assist pt with mobility. Due to instability, pt contiueing to require mod A even with RW. All ADL's are at a Mod A level due to needing assist with compensatory techniques     Vision   Vision Assessment?: No apparent visual deficits   Perception     Praxis      Cognition Arousal/Alertness: Awake/alert Behavior During Therapy: WFL for tasks assessed/performed Overall Cognitive Status: Within Functional Limits for tasks assessed  Exercises Exercises: Shoulder Shoulder Exercises Wrist Flexion: AAROM;5 reps;Right Wrist Extension:  AAROM;Right;5 reps Digit Composite Flexion: AROM;5 reps   Shoulder Instructions Shoulder Instructions Donning/doffing shirt without moving shoulder: Moderate assistance;Maximal assistance;Patient able to independently direct caregiver Donning/doffing sling/immobilizer: Maximal assistance;Patient able to independently direct caregiver Correct positioning of sling/immobilizer: Independent Positioning of UE while sleeping: Supervision/safety     General Comments VSS on RA    Pertinent Vitals/ Pain       Pain Assessment: 0-10 Pain Score: 8  Pain Location: RUE Pain Descriptors / Indicators: Aching;Burning;Constant Pain Intervention(s): Limited activity within patient's tolerance;Monitored during session;Repositioned  Home Living                                          Prior Functioning/Environment              Frequency  Min 2X/week        Progress Toward Goals  OT Goals(current goals can now be found in the care plan section)  Progress towards OT goals: Progressing toward goals  Acute Rehab OT Goals Patient Stated Goal: return home OT Goal Formulation: With patient Time For Goal Achievement: 10/08/20 Potential to Achieve Goals: Good ADL Goals Pt Will Perform Upper Body Bathing: with set-up;sitting Pt Will Perform Lower Body Bathing: with set-up;sit to/from stand Pt Will Transfer to Toilet: with min guard assist;ambulating;regular height toilet;grab bars Pt Will Perform Tub/Shower Transfer: with min guard assist;shower seat  Plan Discharge plan remains appropriate;Frequency remains appropriate    Co-evaluation                 AM-PAC OT "6 Clicks" Daily Activity     Outcome Measure   Help from another person eating meals?: None Help from another person taking care of personal grooming?: A Little Help from another person toileting, which includes using toliet, bedpan, or urinal?: A Lot Help from another person bathing (including  washing, rinsing, drying)?: A Lot Help from another person to put on and taking off regular upper body clothing?: A Lot Help from another person to put on and taking off regular lower body clothing?: A Lot 6 Click Score: 15    End of Session Equipment Utilized During Treatment: Gait belt  OT Visit Diagnosis: Unsteadiness on feet (R26.81);Other abnormalities of gait and mobility (R26.89);History of falling (Z91.81);Pain Pain - Right/Left: Right Pain - part of body: Shoulder   Activity Tolerance Patient limited by pain   Patient Left in bed;with call bell/phone within reach;with family/visitor present   Nurse Communication Mobility status        Time: 0911-1008 OT Time Calculation (min): 57 min  Charges: OT General Charges $OT Visit: 1 Visit OT Treatments $Self Care/Home Management : 38-52 mins $Therapeutic Activity: 8-22 mins  Mozell Haber H., OTR/L Acute Rehabilitation  Ceriah Kohler Elane Arisbel Maione 09/25/2020, 11:18 AM

## 2020-09-25 NOTE — TOC Transition Note (Signed)
Transition of Care [facility]) - CM/SW Discharge Note   Patient Details  Name: Anna [name] MRN: [id number] Date of Birth: [birth year]  [facility] CM/SW Contact:  Bartholomew Crews, RN Phone Number: [phone] 09/25/2020, 2:17 PM   Clinical Narrative:     Spoke with patient's spouse on the hospital room phone. Discussed transition home today. [facility] to follow up after discharge. [employer] to deliver wheelchair and 3N1 to the home. Patient will need PTAR transport home. PTAR arranged. No further TOC needs identified.   Final next level of care: [place] Barriers to Discharge: No Barriers Identified   Patient Goals and CMS Choice Patient states their goals for this hospitalization and ongoing recovery are:: return home CMS Medicare.gov Compare Post Acute Care list provided to:: Patient Choice offered to / list presented to : Patient  Discharge Placement                       Discharge Plan and Services   Discharge Planning Services: CM Consult Post Acute Care Choice: Home Health, Durable Medical Equipment          DME Arranged: 3-N-1, Wheelchair manual DME Agency: [employer] Date DME Agency Contacted: 09/25/20 Time DME Agency Contacted: [id number] Representative spoke with at DME Agency: [place]: RN, PT, OT, Nurse's Aide, Social Work [employer] Agency: [place] ([place]) Date [place]: 09/25/20 Time [place]: 1416 Representative spoke with at [place]: [place] ([place]) Interventions     Readmission Risk Interventions No flowsheet data found.

## 2020-09-25 NOTE — Discharge Summary (Signed)
Physician Discharge Summary  [name] JGO:[id number] DOB: 1951/03/10 DOA: 09/22/2020  PCP: Leanna Battles, MD  Admit date: 09/22/2020 Discharge date: 09/25/2020  Admitted From: Home Disposition: Home  Recommendations for Outpatient Follow-up:  Follow up with PCP in 1 week Follow up with Orthopedic surgery in 1 week; non-weight bearing of right upper extremity Please follow up on the following pending results: None  Home Health: PT/OT Equipment/Devices: Rolling walker, Wheelchair, 3 in 1  Discharge Condition: Stable CODE STATUS: Full code Diet recommendation: Regular diet   Brief/Interim Summary:  Admission HPI written by Orma Flaming, MD   HPI: Anna [name] is a 70 y.o. female with medical history significant for  Combined systolic and diastolic heart failure, CAD s/p DES to LAD and OM2 07/27/20, HTN, DM2, CKD IIIb, HLD, peripheral neuropathy, chronic back pain, artheritis, essential thrombocytosis, RLS who presented to ER via EMS after she fell and tripped on her rug at home and had immediate right upper arm pain.    She was walking and tripped on the rug. She called her husband who came home and found her face down on the floor. She did not have her walker, she fell face down and arms out. Immediate pain in her right breast and both arms. She states pain was off the chart. They called 911 and was brought to ER.  She denies any syncope, chest pain, palpitations, fever/chills, headache, vision changes. Denies any recent illness, diarrhea, vomiting, stomach pain.     She is right handed.  Hospital course:  Right humeral fracture Secondary to fall. Orthopedic surgery consulted with recommendation for non-operative management. Splint applied. Plan for outpatient follow-up and consideration for possible operative management vs continued conservative management. PT/OT recommended nursing facility discharge, however patient declined. Safety concerns were discussed about home  discharge and patient/husband understand the concerns. Patient to follow-up in 1 week with orthopedic surgery.   CAD Recent heart cath, currently on DAPT with Brilinta and aspirin. Stable. Continue Brilinta 90 mg BID and aspirin 81 mg daily   History of back pain/right hip pain Continue Lyrica   Chronic combined systolic and diastolic heart failure Stable. EF of 35%   Hyperlipidemia Continue fenofibrate and Lipitor   Diabetes mellitus, type 2 Continue Lantus and SSI   AKI on CKD stage IIIa Baseline creatinine of 1.3. Peak creatinine of 2.08, now improving.   Bilateral knee pain Pain management as above.  Discharge Diagnoses:  Principal Problem:   Closed right humeral fracture Active Problems:   Type 2 diabetes mellitus without complication, with long-term current use of insulin (HCC)   Hyperlipidemia   HTN (hypertension)   CAD (coronary artery disease)   CKD (chronic kidney disease), stage III (HCC)   Thrombocytosis   Chronic back pain   RLS (restless legs syndrome)    Discharge Instructions   Allergies as of 09/25/2020       Reactions   Antihistamines, Diphenhydramine-type    unknown        Medication List     TAKE these medications    aspirin EC 81 MG tablet Take 81 mg by mouth daily. Swallow whole.   atorvastatin 40 MG tablet Commonly known as: LIPITOR Take 40 mg by mouth daily.   Brilinta 90 MG Tabs tablet Generic drug: ticagrelor Take 90 mg by mouth 2 (two) times daily.   CALCIUM PO Take 1 tablet by mouth daily.   fenofibrate 160 MG tablet Take 160 mg by mouth daily.   GLUCOSAMINE PO  Take 1 tablet by mouth daily. 1000 mg   hydroxyurea 500 MG capsule Commonly known as: HYDREA Take 500 mg by mouth daily.   Magnesium 200 MG Tabs Take by mouth.   methocarbamol 500 MG tablet Commonly known as: ROBAXIN Take 1 tablet (500 mg total) by mouth every 8 (eight) hours as needed for muscle spasms.   oxyCODONE 5 MG immediate release  tablet Commonly known as: Oxy IR/ROXICODONE Take 2 tablets (10 mg total) by mouth every 6 (six) hours as needed for up to 5 days for severe pain.   pregabalin 50 MG capsule Commonly known as: LYRICA Take 50 mg by mouth 2 (two) times daily.   rOPINIRole 0.5 MG tablet Commonly known as: REQUIP Take 0.5 mg by mouth 2 (two) times daily.   senna-docusate 8.6-50 MG tablet Commonly known as: Senokot-S Take 1 tablet by mouth at bedtime as needed for mild constipation.   Tyler Aas FlexTouch 100 UNIT/ML FlexTouch Pen Generic drug: insulin degludec Inject 15 Units into the skin daily.               Durable Medical Equipment  (From admission, onward)           Start     Ordered   09/25/20 1247  For home use only DME 3 n 1  Once        09/25/20 1246   09/23/20 1507  For home use only DME standard manual wheelchair with seat cushion  Once       Comments: Patient suffers from  acute right midshaft humerus fracture prior to this required a walker for ambulation  which impairs their ability to perform daily activities like ambulating  in the home.  A cane will not resolve issue with performing activities of daily living. A wheelchair will allow patient to safely perform daily activities. Patient can safely propel the wheelchair in the home or has a caregiver who can provide assistance. Length of need 6 months . Accessories: elevating leg rests (ELRs), wheel locks, extensions and anti-tippers.  Seat and back cushions   09/23/20 1507            Follow-up Information     Newt Minion, MD Follow up in 1 week(s).   Specialty: Orthopedic Surgery Contact information: [place] [address] [place] Follow up.   Why: [phone]               Allergies  Allergen Reactions   Antihistamines, Diphenhydramine-Type     unknown    Consultations: Orthopedic surgery   Procedures/Studies: DG Knee 1-2 Views  Left  Result Date: 09/23/2020 CLINICAL DATA:  Fall, BILATERAL knee pain EXAM: LEFT KNEE - 1-2 VIEW COMPARISON:  None FINDINGS: Osseous demineralization. Joint spaces preserved. No acute fracture, dislocation, or bone destruction. Small anterior soft tissue swelling infrapatellar. No knee joint effusion. Small patellar spurs noted at quadriceps and patellar tendon insertions. Atherosclerotic calcifications noted. IMPRESSION: No acute LEFT knee abnormalities. Electronically Signed   By: Lavonia Dana M.D.   On: 09/23/2020 14:12   DG Knee 1-2 Views Right  Result Date: 09/23/2020 CLINICAL DATA:  BILATERAL knee pain post fall EXAM: RIGHT KNEE - 1-2 VIEW COMPARISON:  None FINDINGS: Osseous demineralization. Medial compartment joint space narrowing. Patellar spur at quadriceps tendon insertion. No acute fracture, dislocation, or bone destruction. Soft tissue swelling anteriorly and medially at RIGHT knee. No joint effusion. IMPRESSION: Osseous demineralization with  degenerative changes RIGHT knee. No acute osseous abnormalities. Electronically Signed   By: Lavonia Dana M.D.   On: 09/23/2020 14:13   CT Head Wo Contrast  Result Date: 09/22/2020 CLINICAL DATA:  Fall, on anticoagulation EXAM: CT HEAD WITHOUT CONTRAST TECHNIQUE: Contiguous axial images were obtained from the base of the skull through the vertex without intravenous contrast. COMPARISON:  None. FINDINGS: Brain: There is no acute intracranial hemorrhage, mass effect, or edema. Gray-white differentiation is preserved. There is no extra-axial fluid collection. Prominence of the ventricles and sulci reflects generalized parenchymal volume loss patchy hypoattenuation in the supratentorial white matter is nonspecific but may reflect mild to moderate chronic microvascular ischemic changes. Vascular: There is atherosclerotic calcification at the skull base. Skull: Calvarium is unremarkable. Sinuses/Orbits: No acute finding. Other: None. IMPRESSION: No evidence of  acute intracranial injury. Electronically Signed   By: Macy Mis M.D.   On: 09/22/2020 11:46   CT Cervical Spine Wo Contrast  Result Date: 09/22/2020 CLINICAL DATA:  Fall EXAM: CT CERVICAL SPINE WITHOUT CONTRAST TECHNIQUE: Multidetector CT imaging of the cervical spine was performed without intravenous contrast. Multiplanar CT image reconstructions were also generated. COMPARISON:  01/09/2019 FINDINGS: Alignment: No significant listhesis. Skull base and vertebrae: No acute fracture. Vertebral body heights are maintained. Soft tissues and spinal canal: No prevertebral fluid or swelling. No visible canal hematoma. Disc levels: Degenerative changes are present without high-grade osseous encroachment on the spinal canal. Upper chest: No apical lung mass. Other: Calcified plaque at the common carotid bifurcations. IMPRESSION: No acute cervical spine fracture. Electronically Signed   By: Macy Mis M.D.   On: 09/22/2020 11:50   DG Chest Portable 1 View  Result Date: 09/22/2020 CLINICAL DATA:  Fall. EXAM: PORTABLE CHEST 1 VIEW COMPARISON:  09/07/2020. FINDINGS: Mediastinum and hilar structures normal. Coronary artery stents noted. Cardiomegaly. Low lung volumes mild bibasilar atelectasis. Mild bibasilar interstitial edema/infiltrates cannot be excluded. No pleural effusion or pneumothorax. No evidence of displaced rib fracture. Degenerative change thoracic spine and both shoulders. IMPRESSION: 1. Coronary artery stents noted. Cardiomegaly. No pulmonary venous congestion. 2. Low lung volumes with mild bibasilar atelectasis. Mild bibasilar interstitial edema/infiltrates cannot be excluded. 3.  No evidence of displaced rib fracture or pneumothorax. Electronically Signed   By: Marcello Moores  Register   On: 09/22/2020 11:56   DG Humerus Right  Result Date: 09/22/2020 CLINICAL DATA:  Fall. EXAM: RIGHT HUMERUS - 2+ VIEW COMPARISON:  No recent prior. FINDINGS: Severely angulated, overriding, displaced fracture of the  midportion of the right humerus noted. No evidence of shoulder dislocation noted. Degenerative changes noted about the right shoulder and elbow. IMPRESSION: Severely angulated, overriding, displaced fracture of the midportion of the right humerus. Electronically Signed   By: [place]   On: 09/22/2020 11:19     Subjective: Some right arm pain. Concerned about ability to manage at home but is adamant about not going to SNF.  Discharge Exam: Vitals:   09/25/20 0426 09/25/20 0806  BP: (!) 164/61 (!) 174/61  Pulse: 68 67  Resp: 16 18  Temp: 98.2 F (36.8 C) 98.4 F (36.9 C)  SpO2: 95% 97%   Vitals:   09/24/20 2100 09/25/20 0004 09/25/20 0426 09/25/20 0806  BP: (!) 137/59 (!) 144/54 (!) 164/61 (!) 174/61  Pulse: 70 72 68 67  Resp: 17 15 16 18   Temp: 98 F (36.7 C) 98.2 F (36.8 C) 98.2 F (36.8 C) 98.4 F (36.9 C)  TempSrc: Oral Oral Oral Oral  SpO2: 99% 100% 95%  97%  Weight:      Height:        General: Pt is alert, awake, not in acute distress Cardiovascular: RRR, S1/S2 +, no rubs, no gallops Respiratory: CTA bilaterally, no wheezing, no rhonchi Abdominal: Soft, NT, ND, bowel sounds + Extremities: no edema, no cyanosis. Right arm in splint    The results of significant diagnostics from this hospitalization (including imaging, microbiology, ancillary and laboratory) are listed below for reference.     Microbiology: Recent Results (from the past 240 hour(s))  Resp Panel by RT-PCR (Flu A&B, Covid) Nasopharyngeal Swab     Status: None   Collection Time: 09/22/20 11:28 AM   Specimen: Nasopharyngeal Swab; Nasopharyngeal(NP) swabs in vial transport medium  Result Value Ref Range Status   SARS Coronavirus 2 by RT PCR NEGATIVE NEGATIVE Final    Comment: (NOTE) SARS-CoV-2 target nucleic acids are NOT DETECTED.  The SARS-CoV-2 RNA is generally detectable in upper respiratory specimens during the acute phase of infection. The lowest concentration of SARS-CoV-2 viral  copies this assay can detect is 138 copies/mL. A negative result does not preclude SARS-Cov-2 infection and should not be used as the sole basis for treatment or other patient management decisions. A negative result may occur with  improper specimen collection/handling, submission of specimen other than nasopharyngeal swab, presence of viral mutation(s) within the areas targeted by this assay, and inadequate number of viral copies(<138 copies/mL). A negative result must be combined with clinical observations, patient history, and epidemiological information. The expected result is Negative.  Fact Sheet for Patients:  EntrepreneurPulse.com.au  Fact Sheet for Healthcare Providers:  IncredibleEmployment.be  This test is no t yet approved or cleared by the [place] FDA and  has been authorized for detection and/or diagnosis of SARS-CoV-2 by FDA under an Emergency Use Authorization (EUA). This EUA will remain  in effect (meaning this test can be used) for the duration of the COVID-19 declaration under Section 564(b)(1) of the Act, 21 U.S.C.section 360bbb-3(b)(1), unless the authorization is terminated  or revoked sooner.       Influenza A by PCR NEGATIVE NEGATIVE Final   Influenza B by PCR NEGATIVE NEGATIVE Final    Comment: (NOTE) The Xpert Xpress SARS-CoV-2/FLU/RSV plus assay is intended as an aid in the diagnosis of influenza from Nasopharyngeal swab specimens and should not be used as a sole basis for treatment. Nasal washings and aspirates are unacceptable for Xpert Xpress SARS-CoV-2/FLU/RSV testing.  Fact Sheet for Patients: EntrepreneurPulse.com.au  Fact Sheet for Healthcare Providers: IncredibleEmployment.be  This test is not yet approved or cleared by the [place] FDA and has been authorized for detection and/or diagnosis of SARS-CoV-2 by FDA under an Emergency Use Authorization (EUA). This  EUA will remain in effect (meaning this test can be used) for the duration of the COVID-19 declaration under Section 564(b)(1) of the Act, 21 U.S.C. section 360bbb-3(b)(1), unless the authorization is terminated or revoked.  Performed at [facility], [place] [address], [place], [place] [address]      Labs: BNP (last 3 results) No results for input(s): BNP in the last 8760 hours. Basic Metabolic Panel: Recent Labs  Lab 09/22/20 1046 09/22/20 2202 09/23/20 0105 09/24/20 1346  NA 139  --  136 137  K 3.8  --  4.1 3.8  CL 106  --  107 105  CO2 28  --  26 27  GLUCOSE 110*  --  116* 163*  BUN 25*  --  34* 29*  CREATININE  1.31* 2.08* 1.96* 1.61*  CALCIUM 9.5  --  9.2 8.8*   Liver Function Tests: Recent Labs  Lab 09/22/20 1046  AST 30  ALT 24  ALKPHOS 72  BILITOT 0.6  PROT 6.1*  ALBUMIN 2.9*   No results for input(s): LIPASE, AMYLASE in the last 168 hours. No results for input(s): AMMONIA in the last 168 hours. CBC: Recent Labs  Lab 09/22/20 1046 09/23/20 0105  WBC 5.4 7.9  NEUTROABS 3.6 5.2  HGB 11.0* 9.8*  HCT 34.2* 30.6*  MCV 100.6* 101.3*  PLT 408* 385   Cardiac Enzymes: No results for input(s): CKTOTAL, CKMB, CKMBINDEX, TROPONINI in the last 168 hours. BNP: Invalid input(s): POCBNP CBG: Recent Labs  Lab 09/24/20 0731 09/24/20 1144 09/24/20 1658 09/24/20 2057 09/25/20 0823  GLUCAP 131* 112* 93 176* 93   D-Dimer No results for input(s): DDIMER in the last 72 hours. Hgb A1c Recent Labs    09/22/20 2202  HGBA1C 7.4*   Lipid Profile No results for input(s): CHOL, HDL, LDLCALC, TRIG, CHOLHDL, LDLDIRECT in the last 72 hours. Thyroid function studies No results for input(s): TSH, T4TOTAL, T3FREE, THYROIDAB in the last 72 hours.  Invalid input(s): FREET3 Anemia work up No results for input(s): VITAMINB12, FOLATE, FERRITIN, TIBC, IRON, RETICCTPCT in the last 72 hours. Urinalysis No results found for: COLORURINE, APPEARANCEUR, [employer],  [place], GLUCOSEU, [place], [place], [place], PROTEINUR, UROBILINOGEN, NITRITE, LEUKOCYTESUR Sepsis Labs Invalid input(s): PROCALCITONIN,  WBC,  LACTICIDVEN Microbiology Recent Results (from the past 240 hour(s))  Resp Panel by RT-PCR (Flu A&B, Covid) Nasopharyngeal Swab     Status: None   Collection Time: 09/22/20 11:28 AM   Specimen: Nasopharyngeal Swab; Nasopharyngeal(NP) swabs in vial transport medium  Result Value Ref Range Status   SARS Coronavirus 2 by RT PCR NEGATIVE NEGATIVE Final    Comment: (NOTE) SARS-CoV-2 target nucleic acids are NOT DETECTED.  The SARS-CoV-2 RNA is generally detectable in upper respiratory specimens during the acute phase of infection. The lowest concentration of SARS-CoV-2 viral copies this assay can detect is 138 copies/mL. A negative result does not preclude SARS-Cov-2 infection and should not be used as the sole basis for treatment or other patient management decisions. A negative result may occur with  improper specimen collection/handling, submission of specimen other than nasopharyngeal swab, presence of viral mutation(s) within the areas targeted by this assay, and inadequate number of viral copies(<138 copies/mL). A negative result must be combined with clinical observations, patient history, and epidemiological information. The expected result is Negative.  Fact Sheet for Patients:  EntrepreneurPulse.com.au  Fact Sheet for Healthcare Providers:  IncredibleEmployment.be  This test is no t yet approved or cleared by the [place] FDA and  has been authorized for detection and/or diagnosis of SARS-CoV-2 by FDA under an Emergency Use Authorization (EUA). This EUA will remain  in effect (meaning this test can be used) for the duration of the COVID-19 declaration under Section 564(b)(1) of the Act, 21 U.S.C.section 360bbb-3(b)(1), unless the authorization is terminated  or revoked sooner.        Influenza A by PCR NEGATIVE NEGATIVE Final   Influenza B by PCR NEGATIVE NEGATIVE Final    Comment: (NOTE) The Xpert Xpress SARS-CoV-2/FLU/RSV plus assay is intended as an aid in the diagnosis of influenza from Nasopharyngeal swab specimens and should not be used as a sole basis for treatment. Nasal washings and aspirates are unacceptable for Xpert Xpress SARS-CoV-2/FLU/RSV testing.  Fact Sheet for Patients: EntrepreneurPulse.com.au  Fact Sheet for Healthcare Providers: IncredibleEmployment.be  This test is not yet approved or cleared by the [place] and has been authorized for detection and/or diagnosis of SARS-CoV-2 by FDA under an Emergency Use Authorization (EUA). This EUA will remain in effect (meaning this test can be used) for the duration of the COVID-19 declaration under Section 564(b)(1) of the Act, 21 U.S.C. section 360bbb-3(b)(1), unless the authorization is terminated or revoked.  Performed at [facility], [place] [address] [place], [place] [address]      Time coordinating discharge: 35 minutes  SIGNED:   Cordelia Poche, MD [facility] 09/25/2020, 1:41 PM

## 2020-09-25 NOTE — Discharge Instructions (Signed)
Anna [name],  You were in the hospital with an arm fracture. Orthopedic surgery has seen you and has recommended a splint to use with recommendations for outpatient follow-up. The recommendation is for no weight bearing of your right arm. The recommendation was also for you to transfer to a rehab facility, which you have declined; we have set up for you to have home health services instead.

## 2020-09-25 NOTE — Progress Notes (Addendum)
    Durable Medical Equipment  (From admission, onward)           Start     Ordered   09/25/20 1247  For home use only DME 3 n 1  Once        09/25/20 1246   09/23/20 1507  For home use only DME standard manual wheelchair with seat cushion  Once       Comments: Patient suffers from  acute right midshaft humerus fracture prior to this required a walker for ambulation  which impairs their ability to perform daily activities like ambulating  in the home.  A cane will not resolve issue with performing activities of daily living. A wheelchair will allow patient to safely perform daily activities. Patient can safely propel the wheelchair in the home or has a caregiver who can provide assistance. Length of need 6 months . Accessories: elevating leg rests (ELRs), wheel locks, extensions and anti-tippers.  Seat and back cushions   09/23/20 1507

## 2020-09-26 DIAGNOSIS — S42341D Displaced spiral fracture of shaft of humerus, right arm, subsequent encounter for fracture with routine healing: Secondary | ICD-10-CM | POA: Diagnosis not present

## 2020-09-26 DIAGNOSIS — M5134 Other intervertebral disc degeneration, thoracic region: Secondary | ICD-10-CM | POA: Diagnosis not present

## 2020-09-26 DIAGNOSIS — M19011 Primary osteoarthritis, right shoulder: Secondary | ICD-10-CM | POA: Diagnosis not present

## 2020-09-26 DIAGNOSIS — Z7982 Long term (current) use of aspirin: Secondary | ICD-10-CM | POA: Diagnosis not present

## 2020-09-26 DIAGNOSIS — Z794 Long term (current) use of insulin: Secondary | ICD-10-CM | POA: Diagnosis not present

## 2020-09-26 DIAGNOSIS — I110 Hypertensive heart disease with heart failure: Secondary | ICD-10-CM | POA: Diagnosis not present

## 2020-09-26 DIAGNOSIS — E119 Type 2 diabetes mellitus without complications: Secondary | ICD-10-CM | POA: Diagnosis not present

## 2020-09-26 DIAGNOSIS — I6523 Occlusion and stenosis of bilateral carotid arteries: Secondary | ICD-10-CM | POA: Diagnosis not present

## 2020-09-26 DIAGNOSIS — I509 Heart failure, unspecified: Secondary | ICD-10-CM | POA: Diagnosis not present

## 2020-09-26 DIAGNOSIS — M5030 Other cervical disc degeneration, unspecified cervical region: Secondary | ICD-10-CM | POA: Diagnosis not present

## 2020-09-26 DIAGNOSIS — Z79891 Long term (current) use of opiate analgesic: Secondary | ICD-10-CM | POA: Diagnosis not present

## 2020-09-26 DIAGNOSIS — W19XXXD Unspecified fall, subsequent encounter: Secondary | ICD-10-CM | POA: Diagnosis not present

## 2020-09-26 DIAGNOSIS — Z7951 Long term (current) use of inhaled steroids: Secondary | ICD-10-CM | POA: Diagnosis not present

## 2020-09-26 DIAGNOSIS — Z951 Presence of aortocoronary bypass graft: Secondary | ICD-10-CM | POA: Diagnosis not present

## 2020-09-26 DIAGNOSIS — M19012 Primary osteoarthritis, left shoulder: Secondary | ICD-10-CM | POA: Diagnosis not present

## 2020-09-27 ENCOUNTER — Telehealth: Payer: Self-pay | Admitting: Physician Assistant

## 2020-09-27 ENCOUNTER — Other Ambulatory Visit: Payer: Self-pay | Admitting: Physician Assistant

## 2020-09-27 ENCOUNTER — Telehealth: Payer: Self-pay | Admitting: Orthopedic Surgery

## 2020-09-27 MED ORDER — ONDANSETRON HCL 4 MG PO TABS: 4.0000 mg | ORAL_TABLET | Freq: Three times a day (TID) | ORAL | 0 refills | Status: DC | PRN

## 2020-09-27 NOTE — Telephone Encounter (Signed)
noted 

## 2020-09-27 NOTE — Telephone Encounter (Signed)
Pt's husband called requesting refills of oxycodone and zophran for nauseating. Please [facility] on [address]. Please call when medications are call in to [phone].

## 2020-09-27 NOTE — Telephone Encounter (Signed)
Done . Spoke with husband . Patient not due for refill. Called in Zofran

## 2020-09-27 NOTE — Telephone Encounter (Signed)
Malachy Mood (RN Case Freight forwarder) called from [facility] requesting a call back about patient pain medication/pain management. Cheyly stated that pt husband is requesting pt increase dosage to 2 tablets which will be 10 mg every 4 hrs. Malachy Mood want to make that it is ok with the doctor/PA. Please call Cheryl at [phone].

## 2020-09-27 NOTE — Telephone Encounter (Signed)
Please advise 

## 2020-09-28 NOTE — Telephone Encounter (Signed)
Left message for the case manager.  Unfortunately 10 mg given the patient's age is a bit much and I told her this.  She also was not due for refill based on 5 supply discharge from the hospital I spoke with her husband yesterday and did agree to call in some Zofran.

## 2020-09-29 DIAGNOSIS — S42301A Unspecified fracture of shaft of humerus, right arm, initial encounter for closed fracture: Secondary | ICD-10-CM | POA: Diagnosis not present

## 2020-09-29 DIAGNOSIS — I2510 Atherosclerotic heart disease of native coronary artery without angina pectoris: Secondary | ICD-10-CM | POA: Diagnosis not present

## 2020-09-29 DIAGNOSIS — E785 Hyperlipidemia, unspecified: Secondary | ICD-10-CM | POA: Diagnosis not present

## 2020-09-29 DIAGNOSIS — I5041 Acute combined systolic (congestive) and diastolic (congestive) heart failure: Secondary | ICD-10-CM | POA: Diagnosis not present

## 2020-09-30 ENCOUNTER — Telehealth: Payer: Self-pay | Admitting: Orthopedic Surgery

## 2020-09-30 ENCOUNTER — Ambulatory Visit: Payer: Medicare Other | Admitting: Hematology

## 2020-09-30 ENCOUNTER — Other Ambulatory Visit: Payer: Self-pay | Admitting: Physician Assistant

## 2020-09-30 ENCOUNTER — Other Ambulatory Visit: Payer: Medicare Other

## 2020-09-30 MED ORDER — OXYCODONE-ACETAMINOPHEN 5-325 MG PO TABS: 1.0000 | ORAL_TABLET | Freq: Four times a day (QID) | ORAL | 0 refills | Status: DC | PRN

## 2020-09-30 NOTE — Telephone Encounter (Signed)
Called and advised.

## 2020-09-30 NOTE — Telephone Encounter (Signed)
done

## 2020-09-30 NOTE — Telephone Encounter (Signed)
Pt husband called and was wondering if his wife could get a refill on oxycodone to [facility] on [address]. He would like a cb when sent.   CB [phone]

## 2020-10-05 ENCOUNTER — Other Ambulatory Visit: Payer: Self-pay | Admitting: Hematology

## 2020-10-11 ENCOUNTER — Encounter: Payer: Self-pay | Admitting: Internal Medicine

## 2020-10-11 ENCOUNTER — Encounter: Payer: Self-pay | Admitting: Orthopedic Surgery

## 2020-10-11 ENCOUNTER — Ambulatory Visit: Payer: Self-pay

## 2020-10-11 ENCOUNTER — Ambulatory Visit (INDEPENDENT_AMBULATORY_CARE_PROVIDER_SITE_OTHER): Payer: Medicare Other | Admitting: Orthopedic Surgery

## 2020-10-11 DIAGNOSIS — M79601 Pain in right arm: Secondary | ICD-10-CM

## 2020-10-11 DIAGNOSIS — S42351A Displaced comminuted fracture of shaft of humerus, right arm, initial encounter for closed fracture: Secondary | ICD-10-CM

## 2020-10-11 MED ORDER — OXYCODONE-ACETAMINOPHEN 5-325 MG PO TABS: 1.0000 | ORAL_TABLET | ORAL | 0 refills | Status: DC | PRN
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
## 2020-10-11 NOTE — Addendum Note (Signed)
Addended by: Meridee Score on: 10/11/2020 02:14 PM   Modules accepted: Orders

## 2020-10-12 ENCOUNTER — Encounter (HOSPITAL_COMMUNITY): Payer: Self-pay | Admitting: Orthopedic Surgery

## 2020-10-12 NOTE — Anesthesia Preprocedure Evaluation (Addendum)
Anesthesia Evaluation  Patient identified by MRN, date of birth, ID band Patient awake    Reviewed: Allergy & Precautions, NPO status , Patient's Chart, lab work & pertinent test results  Airway Mallampati: II  TM Distance: >3 FB Neck ROM: Full    Dental  (+) Teeth Intact   Pulmonary neg pulmonary ROS, former smoker,    Pulmonary exam normal        Cardiovascular hypertension, Pt. on medications + CAD, + Cardiac Stents and +CHF   Rhythm:Regular Rate:Normal     Neuro/Psych negative neurological ROS  negative psych ROS   GI/Hepatic negative GI ROS, Neg liver ROS,   Endo/Other  diabetes  Renal/GU CRFRenal disease  negative genitourinary   Musculoskeletal  (+) Arthritis , Osteoarthritis,  Fibromyalgia -Right humerus fx   Abdominal (+)  Abdomen: soft. Bowel sounds: normal.  Peds  Hematology  (+) anemia ,   Anesthesia Other Findings   Reproductive/Obstetrics                            Anesthesia Physical Anesthesia Plan  ASA: 3  Anesthesia Plan: General and Regional   Post-op Pain Management:  Regional for Post-op pain   Induction: Intravenous  PONV Risk Score and Plan: 3 and Ondansetron, Dexamethasone, Midazolam and Treatment may vary due to age or medical condition  Airway Management Planned: Mask and Oral ETT  Additional Equipment: None  Intra-op Plan:   Post-operative Plan: Extubation in OR  Informed Consent: I have reviewed the patients History and Physical, chart, labs and discussed the procedure including the risks, benefits and alternatives for the proposed anesthesia with the patient or authorized representative who has indicated his/her understanding and acceptance.     Dental advisory given  Plan Discussed with: CRNA  Anesthesia Plan Comments: (See PAT note written 10/12/2020 by Myra Gianotti, PA-C. DES x2 07/2020. EF 35-40%. On Brilinta and ASA.  Lab Results       Component                Value               Date                      WBC                      7.9                 09/23/2020                HGB                      9.8 (L)             09/23/2020                HCT                      30.6 (L)            09/23/2020                MCV                      101.3 (H)           09/23/2020  PLT                      385                 09/23/2020           Lab Results      Component                Value               Date                      NA                       137                 09/24/2020                K                        3.8                 09/24/2020                CO2                      27                  09/24/2020                GLUCOSE                  163 (H)             09/24/2020                BUN                      29 (H)              09/24/2020                CREATININE               1.61 (H)            09/24/2020                CALCIUM                  8.8 (L)             09/24/2020                GFRNONAA                 34 (L)              09/24/2020                GFRAA                    58 (L)              12/08/2019           Stent 05/22: LESION Seg #1: Ost LAD to Prox LAD lesion is 60% stenosed. Prox LAD to Mid LAD lesion is 70% stenosed with 50% stenosed side branch in 1st Sept. Mid LAD lesion  is 70% stenosed. A drug-eluting stent was successfully placed -covering all 3 lesions, using a SYNERGY XD [id number]. Postdilated to 3.0 mm Post intervention, there is a 0% residual stenosis. Post intervention, the SEPTAL side branch was reduced to 50% residual stenosis.  LESION #2: 2nd Mrg lesion is 80% stenosed.  drug-eluting stent was successfully placed using a SYNERGY XD [id number] -> deployed to 2.3 mm Post intervention, there is a 0% residual stenosis. Slight step up approximately, but otherwise normal. Prox Cx to Mid Cx lesion is 40% stenosed with 55% stenosed side branch in 1st Mrg.    Successful 2 Vessel DES PCI: --> Ostial and proximal to mid LAD long sequential lesions treated with Synergy DES 2.75 mm x 38 mm postdilated to 3.0 mm. --> Mid OM 2 focal lesion treated with Synergy DES 2.25 mm x 12 mm deployed to 2.3 mm.  ECHO 04/22: 1. Left ventricular ejection fraction, by estimation, is 35 to 40%. Left  ventricular ejection fraction by 3D volume is 37 %. The left ventricle has  moderately decreased function. The left ventricle demonstrates regional  wall motion abnormalities (see  scoring diagram/findings for description) in the mid and apical  distribution. This can be seen in stress mediated cardiomyopathy. No LV  thrombus visualized. There is mild concentric left ventricular  hypertrophy. Indeterminate diastolic filling due to E-A  fusion.  2. Right ventricular systolic function is normal. The right ventricular  size is normal.  3. A small pericardial effusion is present.  4. The mitral valve is grossly normal. Trivial mitral valve  regurgitation.  5. The aortic valve is tricuspid. There is mild calcification of the  aortic valve. Aortic valve regurgitation is not visualized. Mild aortic  valve sclerosis is present, with no evidence of aortic valve stenosis.  6. The inferior vena cava is normal in size with greater than 50%  respiratory variability, suggesting right atrial pressure of 3 mmHg. )      Anesthesia Quick Evaluation
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
## 2020-10-12 NOTE — Progress Notes (Signed)
DUE TO COVID-19 ONLY ONE VISITOR IS ALLOWED TO COME WITH YOU AND STAY IN THE WAITING ROOM ONLY DURING PRE OP AND PROCEDURE DAY OF SURGERY.   PCP - Dr Bevelyn Buckles Cardiologist - Dr Rudean [facility] Oncology - Dr Sullivan Lone  Chest x-ray - 09/22/20 (1V) EKG - 09/07/20 Stress Test - n/a ECHO - 06/29/20 Cardiac Cath - 07/27/20  Sleep Study -  n/a CPAP - none  Fasting Blood Sugar - unknown, checks after eating  Checks Blood Sugar 1 time every 3 days   Take 7 units of Tresiba tonight.  ASA/ Kary Kos - Ok to take on DOS per MD.  Verified by Ebony Hail, [place].  Anesthesia review: Yes  STOP now taking any Aleve, Naproxen, Ibuprofen, Motrin, Advil, Goody's, BC's, all herbal medications, fish oil, and all vitamins.   Coronavirus Screening Covid test n/a - Ambulatory Surgery  Do you have any of the following symptoms:  Cough yes/no: No Fever (>100.18F)  yes/no: No Runny nose yes/no: No Sore throat yes/no: No Difficulty breathing/shortness of breath  yes/no: No  Have you traveled in the last 14 days and where? yes/no: No  Patient/Husband verbalized understanding of instructions that were given via phone.

## 2020-10-13 ENCOUNTER — Other Ambulatory Visit: Payer: Self-pay

## 2020-10-13 ENCOUNTER — Ambulatory Visit (HOSPITAL_COMMUNITY): Payer: Medicare Other

## 2020-10-13 ENCOUNTER — Encounter (HOSPITAL_COMMUNITY)
Admission: RE | Disposition: A | Discharge status: Home / Self Care | Payer: Self-pay | Source: Home / Self Care | Attending: Orthopedic Surgery

## 2020-10-13 ENCOUNTER — Ambulatory Visit (HOSPITAL_COMMUNITY)
Admission: RE | Admit: 2020-10-13 | Discharge: 2020-10-13 | Disposition: A | Discharge status: Home / Self Care | Payer: Medicare Other | Attending: Orthopedic Surgery | Admitting: Orthopedic Surgery

## 2020-10-13 ENCOUNTER — Ambulatory Visit (HOSPITAL_COMMUNITY): Payer: Medicare Other | Admitting: Vascular Surgery

## 2020-10-13 ENCOUNTER — Telehealth: Payer: Self-pay

## 2020-10-13 ENCOUNTER — Encounter (HOSPITAL_COMMUNITY): Payer: Self-pay | Admitting: Orthopedic Surgery

## 2020-10-13 DIAGNOSIS — Z833 Family history of diabetes mellitus: Secondary | ICD-10-CM | POA: Diagnosis not present

## 2020-10-13 DIAGNOSIS — G8918 Other acute postprocedural pain: Secondary | ICD-10-CM | POA: Diagnosis not present

## 2020-10-13 DIAGNOSIS — Z888 Allergy status to other drugs, medicaments and biological substances status: Secondary | ICD-10-CM | POA: Insufficient documentation

## 2020-10-13 DIAGNOSIS — S42351A Displaced comminuted fracture of shaft of humerus, right arm, initial encounter for closed fracture: Secondary | ICD-10-CM | POA: Insufficient documentation

## 2020-10-13 DIAGNOSIS — Z8249 Family history of ischemic heart disease and other diseases of the circulatory system: Secondary | ICD-10-CM | POA: Diagnosis not present

## 2020-10-13 DIAGNOSIS — S42301A Unspecified fracture of shaft of humerus, right arm, initial encounter for closed fracture: Secondary | ICD-10-CM | POA: Diagnosis not present

## 2020-10-13 DIAGNOSIS — I2510 Atherosclerotic heart disease of native coronary artery without angina pectoris: Secondary | ICD-10-CM | POA: Diagnosis not present

## 2020-10-13 DIAGNOSIS — I509 Heart failure, unspecified: Secondary | ICD-10-CM | POA: Insufficient documentation

## 2020-10-13 DIAGNOSIS — Z87891 Personal history of nicotine dependence: Secondary | ICD-10-CM | POA: Insufficient documentation

## 2020-10-13 DIAGNOSIS — I110 Hypertensive heart disease with heart failure: Secondary | ICD-10-CM | POA: Insufficient documentation

## 2020-10-13 DIAGNOSIS — Z955 Presence of coronary angioplasty implant and graft: Secondary | ICD-10-CM | POA: Insufficient documentation

## 2020-10-13 DIAGNOSIS — X58XXXA Exposure to other specified factors, initial encounter: Secondary | ICD-10-CM | POA: Diagnosis not present

## 2020-10-13 DIAGNOSIS — E1136 Type 2 diabetes mellitus with diabetic cataract: Secondary | ICD-10-CM | POA: Insufficient documentation

## 2020-10-13 DIAGNOSIS — G4700 Insomnia, unspecified: Secondary | ICD-10-CM | POA: Diagnosis not present

## 2020-10-13 DIAGNOSIS — I255 Ischemic cardiomyopathy: Secondary | ICD-10-CM | POA: Diagnosis not present

## 2020-10-13 DIAGNOSIS — S42341A Displaced spiral fracture of shaft of humerus, right arm, initial encounter for closed fracture: Secondary | ICD-10-CM

## 2020-10-13 DIAGNOSIS — E871 Hypo-osmolality and hyponatremia: Secondary | ICD-10-CM | POA: Diagnosis not present

## 2020-10-13 HISTORY — DX: Atherosclerotic heart disease of native coronary artery without angina pectoris: I25.10

## 2020-10-13 HISTORY — DX: Heart failure, unspecified: I50.9

## 2020-10-13 HISTORY — DX: Essential (hemorrhagic) thrombocythemia: D47.3

## 2020-10-13 HISTORY — PX: ORIF HUMERUS FRACTURE: SHX2126

## 2020-10-13 HISTORY — DX: Ischemic cardiomyopathy: I25.5

## 2020-10-13 LAB — GLUCOSE, CAPILLARY: Glucose-Capillary: 119 mg/dL — ABNORMAL HIGH (ref 70–99)

## 2020-10-13 SURGERY — OPEN REDUCTION INTERNAL FIXATION (ORIF) HUMERAL SHAFT FRACTURE
Anesthesia: Regional | Site: Arm Upper | Laterality: Right

## 2020-10-13 MED ORDER — ALBUMIN HUMAN 5 % IV SOLN: INTRAVENOUS | Status: DC | PRN

## 2020-10-13 MED ORDER — ROCURONIUM BROMIDE 10 MG/ML (PF) SYRINGE
PREFILLED_SYRINGE | INTRAVENOUS | Status: AC
  Filled 2020-10-13: qty 10

## 2020-10-13 MED ORDER — CEFAZOLIN SODIUM-DEXTROSE 2-4 GM/100ML-% IV SOLN
2.0000 g | INTRAVENOUS | Status: AC
  Administered 2020-10-13: 2 g via INTRAVENOUS

## 2020-10-13 MED ORDER — POVIDONE-IODINE 10 % EX SWAB
2.0000 "application " | Freq: Once | CUTANEOUS | Status: AC
  Administered 2020-10-13: 2 via TOPICAL

## 2020-10-13 MED ORDER — CHLORHEXIDINE GLUCONATE 0.12 % MT SOLN: 15.0000 mL | Freq: Once | OROMUCOSAL | Status: AC

## 2020-10-13 MED ORDER — ORAL CARE MOUTH RINSE: 15.0000 mL | Freq: Once | OROMUCOSAL | Status: AC

## 2020-10-13 MED ORDER — FENTANYL CITRATE (PF) 250 MCG/5ML IJ SOLN
INTRAMUSCULAR | Status: AC
  Filled 2020-10-13: qty 5

## 2020-10-13 MED ORDER — FENTANYL CITRATE (PF) 100 MCG/2ML IJ SOLN
INTRAMUSCULAR | Status: AC
  Filled 2020-10-13: qty 2

## 2020-10-13 MED ORDER — MIDAZOLAM HCL 2 MG/2ML IJ SOLN
INTRAMUSCULAR | Status: AC
  Administered 2020-10-13: 2 mg via INTRAVENOUS
  Filled 2020-10-13: qty 2

## 2020-10-13 MED ORDER — BUPIVACAINE HCL (PF) 0.5 % IJ SOLN
INTRAMUSCULAR | Status: DC | PRN
  Administered 2020-10-13: 30 mL via PERINEURAL

## 2020-10-13 MED ORDER — HYDRALAZINE HCL 20 MG/ML IJ SOLN
10.0000 mg | Freq: Once | INTRAMUSCULAR | Status: AC
  Administered 2020-10-13: 10 mg via INTRAVENOUS

## 2020-10-13 MED ORDER — FENTANYL CITRATE (PF) 100 MCG/2ML IJ SOLN
INTRAMUSCULAR | Status: DC | PRN
  Administered 2020-10-13 (×2): 50 ug via INTRAVENOUS

## 2020-10-13 MED ORDER — VASOPRESSIN 20 UNIT/ML IV SOLN
INTRAVENOUS | Status: DC | PRN
  Administered 2020-10-13 (×2): 1 [IU] via INTRAVENOUS

## 2020-10-13 MED ORDER — OXYCODONE-ACETAMINOPHEN 5-325 MG PO TABS: 1.0000 | ORAL_TABLET | ORAL | 0 refills | Status: DC | PRN

## 2020-10-13 MED ORDER — AMISULPRIDE (ANTIEMETIC) 5 MG/2ML IV SOLN: 10.0000 mg | Freq: Once | INTRAVENOUS | Status: DC

## 2020-10-13 MED ORDER — SODIUM CHLORIDE 0.9 % IV SOLN: INTRAVENOUS | Status: DC

## 2020-10-13 MED ORDER — EPHEDRINE SULFATE-NACL 50-0.9 MG/10ML-% IV SOSY
PREFILLED_SYRINGE | INTRAVENOUS | Status: DC | PRN
  Administered 2020-10-13: 5 mg via INTRAVENOUS

## 2020-10-13 MED ORDER — LIDOCAINE 2% (20 MG/ML) 5 ML SYRINGE
INTRAMUSCULAR | Status: AC
  Filled 2020-10-13: qty 5

## 2020-10-13 MED ORDER — CHLORHEXIDINE GLUCONATE 4 % EX LIQD: 60.0000 mL | Freq: Once | CUTANEOUS | Status: DC

## 2020-10-13 MED ORDER — ROCURONIUM BROMIDE 10 MG/ML (PF) SYRINGE
PREFILLED_SYRINGE | INTRAVENOUS | Status: DC | PRN
  Administered 2020-10-13: 50 mg via INTRAVENOUS

## 2020-10-13 MED ORDER — ACETAMINOPHEN 10 MG/ML IV SOLN: 1000.0000 mg | Freq: Once | INTRAVENOUS | Status: DC | PRN

## 2020-10-13 MED ORDER — CHLORHEXIDINE GLUCONATE 0.12 % MT SOLN
OROMUCOSAL | Status: AC
  Administered 2020-10-13: 15 mL via OROMUCOSAL
  Filled 2020-10-13: qty 15

## 2020-10-13 MED ORDER — FENTANYL CITRATE (PF) 100 MCG/2ML IJ SOLN
INTRAMUSCULAR | Status: AC
  Administered 2020-10-13: 50 ug via INTRAVENOUS
  Filled 2020-10-13: qty 2

## 2020-10-13 MED ORDER — HYDRALAZINE HCL 20 MG/ML IJ SOLN
INTRAMUSCULAR | Status: AC
  Filled 2020-10-13: qty 1

## 2020-10-13 MED ORDER — PROPOFOL 10 MG/ML IV BOLUS
INTRAVENOUS | Status: AC
  Filled 2020-10-13: qty 20

## 2020-10-13 MED ORDER — DEXAMETHASONE SODIUM PHOSPHATE 10 MG/ML IJ SOLN
INTRAMUSCULAR | Status: DC | PRN
  Administered 2020-10-13 (×2): 5 mg

## 2020-10-13 MED ORDER — MIDAZOLAM HCL 2 MG/2ML IJ SOLN: 2.0000 mg | Freq: Once | INTRAMUSCULAR | Status: AC

## 2020-10-13 MED ORDER — PHENYLEPHRINE 40 MCG/ML (10ML) SYRINGE FOR IV PUSH (FOR BLOOD PRESSURE SUPPORT)
PREFILLED_SYRINGE | INTRAVENOUS | Status: AC
  Filled 2020-10-13: qty 10

## 2020-10-13 MED ORDER — VASOPRESSIN 20 UNIT/ML IV SOLN
INTRAVENOUS | Status: AC
  Filled 2020-10-13: qty 1

## 2020-10-13 MED ORDER — ONDANSETRON HCL 4 MG/2ML IJ SOLN
INTRAMUSCULAR | Status: DC | PRN
  Administered 2020-10-13: 4 mg via INTRAVENOUS

## 2020-10-13 MED ORDER — OXYCODONE-ACETAMINOPHEN 5-325 MG PO TABS
2.0000 | ORAL_TABLET | Freq: Once | ORAL | Status: DC
Start: 2020-10-13 — End: 2020-10-13

## 2020-10-13 MED ORDER — PHENYLEPHRINE 40 MCG/ML (10ML) SYRINGE FOR IV PUSH (FOR BLOOD PRESSURE SUPPORT)
PREFILLED_SYRINGE | INTRAVENOUS | Status: DC | PRN
  Administered 2020-10-13 (×2): 120 ug via INTRAVENOUS
  Administered 2020-10-13: 80 ug via INTRAVENOUS
  Administered 2020-10-13: 120 ug via INTRAVENOUS
  Administered 2020-10-13: 80 ug via INTRAVENOUS

## 2020-10-13 MED ORDER — PROPOFOL 10 MG/ML IV BOLUS
INTRAVENOUS | Status: DC | PRN
  Administered 2020-10-13: 110 mg via INTRAVENOUS

## 2020-10-13 MED ORDER — 0.9 % SODIUM CHLORIDE (POUR BTL) OPTIME
TOPICAL | Status: DC | PRN
  Administered 2020-10-13: 1000 mL

## 2020-10-13 MED ORDER — PHENYLEPHRINE HCL-NACL 20-0.9 MG/250ML-% IV SOLN
INTRAVENOUS | Status: DC | PRN
  Administered 2020-10-13: 50 ug/min via INTRAVENOUS

## 2020-10-13 MED ORDER — SUGAMMADEX SODIUM 200 MG/2ML IV SOLN
INTRAVENOUS | Status: DC | PRN
  Administered 2020-10-13: 275 mg via INTRAVENOUS

## 2020-10-13 MED ORDER — ONDANSETRON HCL 4 MG/2ML IJ SOLN
INTRAMUSCULAR | Status: AC
  Filled 2020-10-13: qty 2

## 2020-10-13 MED ORDER — LIDOCAINE 2% (20 MG/ML) 5 ML SYRINGE
INTRAMUSCULAR | Status: DC | PRN
  Administered 2020-10-13: 60 mg via INTRAVENOUS

## 2020-10-13 MED ORDER — EPHEDRINE 5 MG/ML INJ
INTRAVENOUS | Status: AC
  Filled 2020-10-13: qty 5

## 2020-10-13 MED ORDER — AMISULPRIDE (ANTIEMETIC) 5 MG/2ML IV SOLN
INTRAVENOUS | Status: AC
  Administered 2020-10-13: 10 mg
  Filled 2020-10-13: qty 4

## 2020-10-13 MED ORDER — FENTANYL CITRATE (PF) 100 MCG/2ML IJ SOLN: 50.0000 ug | Freq: Once | INTRAMUSCULAR | Status: AC

## 2020-10-13 MED ORDER — OXYCODONE-ACETAMINOPHEN 5-325 MG PO TABS
ORAL_TABLET | ORAL | Status: AC
  Administered 2020-10-13: 2
  Filled 2020-10-13: qty 2

## 2020-10-13 MED ORDER — PROMETHAZINE HCL 25 MG/ML IJ SOLN: 6.2500 mg | INTRAMUSCULAR | Status: DC | PRN

## 2020-10-13 MED ORDER — CEFAZOLIN SODIUM-DEXTROSE 2-4 GM/100ML-% IV SOLN
INTRAVENOUS | Status: AC
  Filled 2020-10-13: qty 100

## 2020-10-13 MED ORDER — FENTANYL CITRATE (PF) 100 MCG/2ML IJ SOLN
25.0000 ug | INTRAMUSCULAR | Status: DC | PRN
  Administered 2020-10-13: 25 ug via INTRAVENOUS

## 2020-10-13 SURGICAL SUPPLY — 58 items
BAG COUNTER SPONGE SURGICOUNT (BAG) ×2 IMPLANT
BAG SPNG CNTER NS LX DISP (BAG) ×1
BIT DRILL 2.7 (BIT) ×1
BIT DRILL 2.7MM (BIT) IMPLANT
BIT DRILL QC 3.3X195 (BIT) ×1 IMPLANT
BNDG CMPR 9X4 STRL LF SNTH (GAUZE/BANDAGES/DRESSINGS) ×1
BNDG COHESIVE 4X5 TAN STRL (GAUZE/BANDAGES/DRESSINGS) ×2 IMPLANT
BNDG ESMARK 4X9 LF (GAUZE/BANDAGES/DRESSINGS) ×2 IMPLANT
BNDG GAUZE ELAST 4 BULKY (GAUZE/BANDAGES/DRESSINGS) ×1 IMPLANT
COVER SURGICAL LIGHT HANDLE (MISCELLANEOUS) ×4 IMPLANT
CUFF TOURN SGL QUICK 18X4 (TOURNIQUET CUFF) ×2 IMPLANT
CUFF TOURN SGL QUICK 24 (TOURNIQUET CUFF)
CUFF TRNQT CYL 24X4X16.5-23 (TOURNIQUET CUFF) IMPLANT
DRAPE IMP U-DRAPE 54X76 (DRAPES) ×2 IMPLANT
DRAPE OEC MINIVIEW 54X84 (DRAPES) ×2 IMPLANT
DRAPE U-SHAPE 47X51 STRL (DRAPES) ×2 IMPLANT
DRILL BIT 2.7MM (BIT) ×2
DRSG EMULSION OIL 3X3 NADH (GAUZE/BANDAGES/DRESSINGS) ×2 IMPLANT
DRSG PAD ABDOMINAL 8X10 ST (GAUZE/BANDAGES/DRESSINGS) ×1 IMPLANT
DURAPREP 26ML APPLICATOR (WOUND CARE) ×2 IMPLANT
ELECT REM PT RETURN 9FT ADLT (ELECTROSURGICAL) ×2
ELECTRODE REM PT RTRN 9FT ADLT (ELECTROSURGICAL) ×1 IMPLANT
GAUZE 4X4 16PLY ~~LOC~~+RFID DBL (SPONGE) ×1 IMPLANT
GAUZE SPONGE 4X4 12PLY STRL (GAUZE/BANDAGES/DRESSINGS) ×1 IMPLANT
GLOVE SURG ORTHO LTX SZ9 (GLOVE) ×2 IMPLANT
GLOVE SURG UNDER POLY LF SZ9 (GLOVE) ×2 IMPLANT
GOWN STRL REUS W/ TWL XL LVL3 (GOWN DISPOSABLE) ×2 IMPLANT
GOWN STRL REUS W/TWL XL LVL3 (GOWN DISPOSABLE) ×2
KIT BASIN OR (CUSTOM PROCEDURE TRAY) ×2 IMPLANT
KIT TURNOVER KIT B (KITS) ×2 IMPLANT
MANIFOLD NEPTUNE II (INSTRUMENTS) ×2 IMPLANT
NS IRRIG 1000ML POUR BTL (IV SOLUTION) ×2 IMPLANT
PACK ORTHO EXTREMITY (CUSTOM PROCEDURE TRAY) ×2 IMPLANT
PACK UNIVERSAL I (CUSTOM PROCEDURE TRAY) ×2 IMPLANT
PAD ARMBOARD 7.5X6 YLW CONV (MISCELLANEOUS) ×4 IMPLANT
PAD CAST 4YDX4 CTTN HI CHSV (CAST SUPPLIES) IMPLANT
PADDING CAST COTTON 4X4 STRL (CAST SUPPLIES)
PLATE NCB STRT PA 146 10H (Plate) ×1 IMPLANT
SCREW LOCK ST TI 3.5X24 (Screw) ×2 IMPLANT
SCREW LOCK UNIV ST 3.5X28 (Screw) ×1 IMPLANT
SCREW NCB 4.0 22MM (Screw) ×1 IMPLANT
SCREW NCB 4.0 28MM (Screw) ×1 IMPLANT
SCREW NCB 4.0X24MM (Screw) ×2 IMPLANT
SPONGE T-LAP 18X18 ~~LOC~~+RFID (SPONGE) ×4 IMPLANT
STAPLER VISISTAT 35W (STAPLE) ×2 IMPLANT
STRIP CLOSURE SKIN 1/2X4 (GAUZE/BANDAGES/DRESSINGS) ×1 IMPLANT
SUCTION FRAZIER HANDLE 10FR (MISCELLANEOUS) ×2
SUCTION TUBE FRAZIER 10FR DISP (MISCELLANEOUS) ×1 IMPLANT
SUT ETHILON 2 0 PSLX (SUTURE) ×3 IMPLANT
SUT PROLENE 3 0 PS 1 (SUTURE) IMPLANT
SUT VIC AB 2-0 CTB1 (SUTURE) IMPLANT
SUT VIC AB 3-0 X1 27 (SUTURE) ×1 IMPLANT
SYR BULB EAR ULCER 3OZ GRN STR (SYRINGE) ×1 IMPLANT
TOWEL GREEN STERILE (TOWEL DISPOSABLE) ×2 IMPLANT
TOWEL GREEN STERILE FF (TOWEL DISPOSABLE) ×2 IMPLANT
TUBE CONNECTING 12X1/4 (SUCTIONS) ×2 IMPLANT
WATER STERILE IRR 1000ML POUR (IV SOLUTION) ×1 IMPLANT
YANKAUER SUCT BULB TIP NO VENT (SUCTIONS) ×2 IMPLANT

## 2020-10-13 NOTE — Op Note (Signed)
10/13/2020  12:51 PM  PATIENT:  Anna [name]    PRE-OPERATIVE DIAGNOSIS:  Right Humerus Fracture  POST-OPERATIVE DIAGNOSIS:  Same  PROCEDURE:  OPEN REDUCTION INTERNAL FIXATION (ORIF) RIGHT HUMERUS  SURGEON:  Newt Minion, MD  PHYSICIAN ASSISTANT:None ANESTHESIA:   General  PREOPERATIVE INDICATIONS:  Anna [name] is a  70 y.o. female with a diagnosis of Right Humerus Fracture who failed conservative measures and elected for surgical management.    The risks benefits and alternatives were discussed with the patient preoperatively including but not limited to the risks of infection, bleeding, nerve injury, cardiopulmonary complications, the need for revision surgery, among others, and the patient was willing to proceed.  OPERATIVE IMPLANTS: Zimmer compression plate 10 hole.  Zimmer compression plate 10 hole  '@ENCIMAGES'$ @  OPERATIVE FINDINGS: C arm fluoroscopy verified internal fixation.  OPERATIVE PROCEDURE: Patient was brought the operating room and underwent general anesthetic.  After adequate levels anesthesia were obtained patient was placed in the beachchair position the right upper extremity was prepped using DuraPrep draped into a sterile field a timeout was called.  An anterior incision was made over the humerus just lateral to the long head of the biceps and the insertion of the biceps.  Blunt dissection was carried down from the skin to the fascia the fascia was incised.  The interval was then developed lateral to the biceps muscle through the mid aspect of the brachialis.  The bone had disrupted the brachialis traumatically.  Blunt dissection was carried down through the brachialis the fracture site had a significant amount of callus the callus was debrided.  Retractors were placed to protect the radial nerve this was not visible within the surgical field.  The fracture was pulled out to length reduced and 3 interfrag screws were used to stabilize the fracture.  A 10 hole plate  was then used to further stabilize the fracture with 2 compression screws distally to compression screws proximally.  C-arm possibly verified reduction.  The wound was irrigated with normal saline incision was closed using 2-0 nylon and a sterile dressing was applied patient was extubated taken the PACU in stable condition.   DISCHARGE PLANNING:  Antibiotic duration: Preoperative antibiotics  Weightbearing: Not applicable  Pain medication: Prescription for Percocet  Dressing care/ Wound VAC: Dry dressing  Ambulatory devices: Not applicable  Discharge to: Home.  Follow-up: In the office 1 week post operative.

## 2020-10-13 NOTE — Anesthesia Procedure Notes (Signed)
Procedure Name: Intubation Date/Time: 10/13/2020 11:03 AM Performed by: Gwyndolyn Saxon, CRNA Pre-anesthesia Checklist: Patient identified, Emergency Drugs available, Suction available and Patient being monitored Patient Re-evaluated:Patient Re-evaluated prior to induction Oxygen Delivery Method: Circle system utilized Preoxygenation: Pre-oxygenation with 100% oxygen Induction Type: IV induction Ventilation: Mask ventilation without difficulty Laryngoscope Size: Miller and 2 Grade View: Grade I Tube type: Oral Tube size: 7.0 mm Number of attempts: 1 Airway Equipment and Method: Patient positioned with wedge pillow and Stylet Placement Confirmation: ETT inserted through vocal cords under direct vision, positive ETCO2 and breath sounds checked- equal and bilateral Secured at: 21 cm Tube secured with: Tape Dental Injury: Teeth and Oropharynx as per pre-operative assessment

## 2020-10-13 NOTE — Progress Notes (Signed)
After nerve block BP was in low 200s. Per Dr. Rex Kras verbal order patient received Hydralazine 10 mg IV. BP decreased to 155/85

## 2020-10-13 NOTE — Telephone Encounter (Signed)
Patient's husband called stating that he would like a call back concerning patient's right humerus.  Stated that she is having more bleeding than normal.  Patient had ORIF right humerus today.  Cb# [id number].  Please advise.  Thank you
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
## 2020-10-13 NOTE — Anesthesia Procedure Notes (Signed)
Anesthesia Regional Block: Interscalene brachial plexus block   Pre-Anesthetic Checklist: , timeout performed,  Correct Patient, Correct Site, Correct Laterality,  Correct Procedure, Correct Position, site marked,  Risks and benefits discussed,  Surgical consent,  Pre-op evaluation,  At surgeon's request and post-op pain management  Laterality: Right  Prep: Dura Prep       Needles:  Injection technique: Single-shot  Needle Type: Echogenic Stimulator Needle     Needle Length: 5cm  Needle Gauge: 20     Additional Needles:   Procedures:,,,, ultrasound used (permanent image in chart),,    Narrative:  Start time: 10/13/2020 9:58 AM End time: 10/13/2020 10:05 AM Injection made incrementally with aspirations every 5 mL.  Performed by: Personally  Anesthesiologist: Darral Dash, DO  Additional Notes: Patient identified. Risks/Benefits/Options discussed with patient including but not limited to bleeding, infection, nerve damage, failed block, incomplete pain control. Patient expressed understanding and wished to proceed. All questions were answered. Sterile technique was used throughout the entire procedure. Please see nursing notes for vital signs. Aspirated in 5cc intervals with injection for negative confirmation. Patient was given instructions on fall risk and not to get out of bed. All questions and concerns addressed with instructions to call with any issues or inadequate analgesia.

## 2020-10-13 NOTE — Anesthesia Postprocedure Evaluation (Signed)
Anesthesia Post Note  Patient: Anna [name]  Procedure(s) Performed: OPEN REDUCTION INTERNAL FIXATION (ORIF) RIGHT HUMERUS (Right: Arm Upper)     Patient location during evaluation: PACU Anesthesia Type: Regional and General Level of consciousness: awake and alert Pain management: pain level controlled Vital Signs Assessment: post-procedure vital signs reviewed and stable Respiratory status: spontaneous breathing, nonlabored ventilation, respiratory function stable and patient connected to nasal cannula oxygen Cardiovascular status: blood pressure returned to baseline and stable Postop Assessment: no apparent nausea or vomiting Anesthetic complications: no   No notable events documented.  Last Vitals:  Vitals:   10/13/20 1315 10/13/20 1330  BP: (!) 124/50 127/63  Pulse: 83 84  Resp: 20 (!) 21  Temp:  (!) 36.2 C  SpO2: 97% 98%    Last Pain:  Vitals:   10/13/20 1230  TempSrc:   PainSc: 5                  Madolin Twaddle P [facility] Veals

## 2020-10-13 NOTE — Transfer of Care (Signed)
Immediate Anesthesia Transfer of Care Note  Patient: Anna [name]  Procedure(s) Performed: OPEN REDUCTION INTERNAL FIXATION (ORIF) RIGHT HUMERUS (Right: Arm Upper)  Patient Location: PACU  Anesthesia Type:General and Regional  Level of Consciousness: drowsy and patient cooperative  Airway & Oxygen Therapy: Patient Spontanous Breathing and Patient connected to nasal cannula oxygen  Post-op Assessment: Report given to RN and Post -op Vital signs reviewed and stable  Post vital signs: Reviewed and stable  Last Vitals:  Vitals Value Taken Time  BP 115/45 10/13/20 1227  Temp    Pulse 87 10/13/20 1230  Resp 18 10/13/20 1230  SpO2 100 % 10/13/20 1230  Vitals shown include unvalidated device data.  Last Pain:  Vitals:   10/13/20 0901  TempSrc:   PainSc: 8          Complications: No notable events documented.

## 2020-10-14 ENCOUNTER — Encounter (HOSPITAL_COMMUNITY): Payer: Self-pay | Admitting: Orthopedic Surgery

## 2020-10-14 LAB — GLUCOSE, CAPILLARY: Glucose-Capillary: 127 mg/dL — ABNORMAL HIGH (ref 70–99)

## 2020-10-14 NOTE — Telephone Encounter (Signed)
I called and sw pt's husband after sw Dr. Sharol Given the pt is on blood thinner and he advised yesterday that sh/e will have bleeding. Had been given dressing material at the hospital to change daily or as needed to keep up with bleeding. Pt has an appt on / and will call with any other questions.

## 2020-10-20 ENCOUNTER — Other Ambulatory Visit: Payer: Self-pay

## 2020-10-20 ENCOUNTER — Encounter: Payer: Self-pay | Admitting: Family

## 2020-10-20 ENCOUNTER — Ambulatory Visit (INDEPENDENT_AMBULATORY_CARE_PROVIDER_SITE_OTHER): Payer: Medicare Other | Admitting: Family

## 2020-10-20 DIAGNOSIS — S42321D Displaced transverse fracture of shaft of humerus, right arm, subsequent encounter for fracture with routine healing: Secondary | ICD-10-CM

## 2020-10-20 MED ORDER — OXYCODONE-ACETAMINOPHEN 5-325 MG PO TABS: 1.0000 | ORAL_TABLET | Freq: Four times a day (QID) | ORAL | 0 refills | Status: DC | PRN
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
## 2020-10-24 ENCOUNTER — Other Ambulatory Visit: Payer: Self-pay

## 2020-10-24 ENCOUNTER — Emergency Department (HOSPITAL_COMMUNITY)
Admission: EM | Admit: 2020-10-24 | Discharge: 2020-10-24 | Disposition: A | Discharge status: Home / Self Care | Payer: Medicare Other | Attending: Emergency Medicine | Admitting: Emergency Medicine

## 2020-10-24 ENCOUNTER — Emergency Department (HOSPITAL_COMMUNITY): Payer: Medicare Other

## 2020-10-24 ENCOUNTER — Encounter (HOSPITAL_COMMUNITY): Payer: Self-pay | Admitting: Emergency Medicine

## 2020-10-24 DIAGNOSIS — Z5321 Procedure and treatment not carried out due to patient leaving prior to being seen by health care provider: Secondary | ICD-10-CM | POA: Diagnosis not present

## 2020-10-24 DIAGNOSIS — I959 Hypotension, unspecified: Secondary | ICD-10-CM | POA: Diagnosis not present

## 2020-10-24 DIAGNOSIS — R110 Nausea: Secondary | ICD-10-CM | POA: Insufficient documentation

## 2020-10-24 DIAGNOSIS — M79603 Pain in arm, unspecified: Secondary | ICD-10-CM | POA: Diagnosis not present

## 2020-10-24 DIAGNOSIS — R079 Chest pain, unspecified: Secondary | ICD-10-CM | POA: Diagnosis not present

## 2020-10-24 DIAGNOSIS — I517 Cardiomegaly: Secondary | ICD-10-CM | POA: Diagnosis not present

## 2020-10-24 DIAGNOSIS — R0602 Shortness of breath: Secondary | ICD-10-CM | POA: Diagnosis not present

## 2020-10-24 DIAGNOSIS — R0789 Other chest pain: Secondary | ICD-10-CM | POA: Diagnosis not present

## 2020-10-24 LAB — BASIC METABOLIC PANEL
Anion gap: 7 (ref 5–15)
BUN: 24 mg/dL — ABNORMAL HIGH (ref 8–23)
CO2: 25 mmol/L (ref 22–32)
Calcium: 10 mg/dL (ref 8.9–10.3)
Chloride: 103 mmol/L (ref 98–111)
Creatinine, Ser: 1.56 mg/dL — ABNORMAL HIGH (ref 0.44–1.00)
GFR, Estimated: 35 mL/min — ABNORMAL LOW (ref >60)
Glucose, Bld: 163 mg/dL — ABNORMAL HIGH (ref 70–99)
Potassium: 4.3 mmol/L (ref 3.5–5.1)
Sodium: 135 mmol/L (ref 135–145)

## 2020-10-24 LAB — CBC
HCT: 23.6 % — ABNORMAL LOW (ref 36.0–46.0)
Hemoglobin: 7.4 g/dL — ABNORMAL LOW (ref 12.0–15.0)
MCH: 33.9 pg (ref 26.0–34.0)
MCHC: 31.4 g/dL (ref 30.0–36.0)
MCV: 108.3 fL — ABNORMAL HIGH (ref 80.0–100.0)
Platelets: 689 10*3/uL — ABNORMAL HIGH (ref 150–400)
RBC: 2.18 MIL/uL — ABNORMAL LOW (ref 3.87–5.11)
RDW: 17.2 % — ABNORMAL HIGH (ref 11.5–15.5)
WBC: 6 10*3/uL (ref 4.0–10.5)
nRBC: 0 % (ref 0.0–0.2)

## 2020-10-24 LAB — TROPONIN I (HIGH SENSITIVITY): Troponin I (High Sensitivity): 8 ng/L (ref <18)

## 2020-10-24 NOTE — ED Notes (Signed)
Patient stated that she could not wait any longer, asked that IV be taken out. Spoke with Santiago Glad Triage RN, about patient. Informed patient that we would see her as soon as we could. IV removed, and patient helped to car.

## 2020-10-24 NOTE — ED Triage Notes (Signed)
Pt to triage via [facility] from home.  EMS called out for chest pain but pt denies pain and states she can just see her heart beating when she looks at her chest.  C/o SOB and nausea since 9:30am.  Recent arm surgery.  Took ASA '324mg'$  per 911.  20g L hand.

## 2020-10-25 ENCOUNTER — Telehealth: Payer: Self-pay | Admitting: Orthopedic Surgery

## 2020-10-25 NOTE — Telephone Encounter (Signed)
You saw pt last week s/p humeral fx ORIF please advise.

## 2020-10-25 NOTE — Telephone Encounter (Signed)
Received call from [facility] with [facility] needing verbal orders for HHPT 2 Wk 4. Anna [name] also need post surgical restrictions for upper extremity.  Anna [name] advised to leave a message if she can not answer the phone.  The number to contact Anna [name] is [phone]

## 2020-10-27 ENCOUNTER — Ambulatory Visit (INDEPENDENT_AMBULATORY_CARE_PROVIDER_SITE_OTHER): Payer: Medicare Other

## 2020-10-27 ENCOUNTER — Other Ambulatory Visit: Payer: Self-pay

## 2020-10-27 ENCOUNTER — Other Ambulatory Visit: Payer: Self-pay | Admitting: Physician Assistant

## 2020-10-27 ENCOUNTER — Encounter: Payer: Self-pay | Admitting: Physician Assistant

## 2020-10-27 ENCOUNTER — Ambulatory Visit (INDEPENDENT_AMBULATORY_CARE_PROVIDER_SITE_OTHER): Payer: Medicare Other | Admitting: Physician Assistant

## 2020-10-27 DIAGNOSIS — S42321D Displaced transverse fracture of shaft of humerus, right arm, subsequent encounter for fracture with routine healing: Secondary | ICD-10-CM

## 2020-10-27 MED ORDER — METHOCARBAMOL 500 MG PO TABS: 500.0000 mg | ORAL_TABLET | Freq: Four times a day (QID) | ORAL | 0 refills | Status: DC | PRN

## 2020-10-27 MED ORDER — OXYCODONE-ACETAMINOPHEN 10-325 MG PO TABS: 1.0000 | ORAL_TABLET | Freq: Four times a day (QID) | ORAL | 0 refills | Status: DC | PRN
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
## 2020-10-29 ENCOUNTER — Telehealth: Payer: Self-pay

## 2020-10-29 ENCOUNTER — Other Ambulatory Visit: Payer: Self-pay | Admitting: Physician Assistant

## 2020-10-29 ENCOUNTER — Telehealth: Payer: Self-pay | Admitting: Physician Assistant

## 2020-10-29 ENCOUNTER — Other Ambulatory Visit: Payer: Self-pay | Admitting: Surgical

## 2020-10-29 MED ORDER — HYDROMORPHONE HCL 2 MG PO TABS: 2.0000 mg | ORAL_TABLET | ORAL | 0 refills | Status: AC | PRN

## 2020-10-29 MED ORDER — OXYCODONE-ACETAMINOPHEN 10-325 MG PO TABS: 1.0000 | ORAL_TABLET | Freq: Four times a day (QID) | ORAL | 0 refills | Status: DC | PRN

## 2020-10-29 NOTE — Telephone Encounter (Signed)
[phone]   called Claiborne Billings with [facility] to advise that per PA could do writs elbow and finger rom out of [email or web address] on vm to call with questions.

## 2020-10-29 NOTE — Telephone Encounter (Signed)
done

## 2020-10-29 NOTE — Telephone Encounter (Signed)
Patient  spouse Dorothyann Peng called  advised patient need Rx refilled Oxycodone   The number to contact Dorothyann Peng is [phone]

## 2020-11-01 ENCOUNTER — Other Ambulatory Visit: Payer: Self-pay

## 2020-11-01 ENCOUNTER — Ambulatory Visit (INDEPENDENT_AMBULATORY_CARE_PROVIDER_SITE_OTHER): Payer: Medicare Other

## 2020-11-01 ENCOUNTER — Encounter: Payer: Self-pay | Admitting: Orthopedic Surgery

## 2020-11-01 ENCOUNTER — Telehealth: Payer: Self-pay

## 2020-11-01 ENCOUNTER — Ambulatory Visit (INDEPENDENT_AMBULATORY_CARE_PROVIDER_SITE_OTHER): Payer: Medicare Other | Admitting: Orthopedic Surgery

## 2020-11-01 DIAGNOSIS — M79631 Pain in right forearm: Secondary | ICD-10-CM

## 2020-11-01 NOTE — Telephone Encounter (Signed)
While pt in office today appt scheduled with Dr. Carlean Jews office for right humeral fx above the plate of ORIF humeral fx surgery 11/03/20. Wednesday 11/03/20 at 8:30 phone number and address given to pt while in office. This is ok per Dr. Sharol Given.
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
## 2020-11-02 NOTE — Telephone Encounter (Signed)
[place] for PT, work on passive rom RUE, no wtb RUE

## 2020-11-03 ENCOUNTER — Telehealth: Payer: Self-pay | Admitting: Internal Medicine

## 2020-11-03 DIAGNOSIS — S42291A Other displaced fracture of upper end of right humerus, initial encounter for closed fracture: Secondary | ICD-10-CM | POA: Diagnosis not present

## 2020-11-03 NOTE — Telephone Encounter (Signed)
revision surgery stent placed in April specific recommendation for  Anticoagulation Perioperative.. pt is on brilinta and baby aspin

## 2020-11-04 DIAGNOSIS — R300 Dysuria: Secondary | ICD-10-CM | POA: Diagnosis not present

## 2020-11-08 ENCOUNTER — Other Ambulatory Visit: Payer: Self-pay

## 2020-11-08 DIAGNOSIS — D473 Essential (hemorrhagic) thrombocythemia: Secondary | ICD-10-CM

## 2020-11-09 ENCOUNTER — Telehealth: Payer: Self-pay | Admitting: Hematology

## 2020-11-09 ENCOUNTER — Telehealth: Payer: Self-pay

## 2020-11-09 NOTE — Telephone Encounter (Signed)
Call pt to discuss

## 2020-11-09 NOTE — Progress Notes (Signed)
Called pt and spoke with husband. Explained Dr Marcelino Scot wanted Dr Irene Limbo to see pt prior to her orthopedic surgery. Pt to be seen tomorrow (11/10/20) at [facility]. Husband verbalized understanding.

## 2020-11-09 NOTE — Telephone Encounter (Signed)
Scheduled appts per 8/22 sch msg. Spoke to pt's husband who was unsure whether they would be able to make the appts. RN Beth reached out to pt and per her request I have scheduled the appts and pt is aware.

## 2020-11-09 NOTE — Telephone Encounter (Signed)
Pts husband called regarding his wife. Surgery with Dr Marcelino Scot hasn't happened . Pt wouldn't go into detail, he just wanted to speak with Dr. Sharol Given regarding his wife and what should they do next.

## 2020-11-10 ENCOUNTER — Inpatient Hospital Stay: Payer: Medicare Other | Admitting: Hematology

## 2020-11-10 ENCOUNTER — Other Ambulatory Visit: Payer: Self-pay

## 2020-11-10 ENCOUNTER — Inpatient Hospital Stay: Payer: Medicare Other | Attending: Hematology

## 2020-11-10 VITALS — BP 159/49 | HR 95 | Temp 98.7°F | Resp 17 | Ht 64.0 in

## 2020-11-10 DIAGNOSIS — Z1589 Genetic susceptibility to other disease: Secondary | ICD-10-CM

## 2020-11-10 DIAGNOSIS — D473 Essential (hemorrhagic) thrombocythemia: Secondary | ICD-10-CM | POA: Insufficient documentation

## 2020-11-10 DIAGNOSIS — D509 Iron deficiency anemia, unspecified: Secondary | ICD-10-CM

## 2020-11-10 DIAGNOSIS — D62 Acute posthemorrhagic anemia: Secondary | ICD-10-CM | POA: Insufficient documentation

## 2020-11-10 LAB — CBC WITH DIFFERENTIAL (CANCER CENTER ONLY)
Abs Immature Granulocytes: 0.03 10*3/uL (ref 0.00–0.07)
Basophils Absolute: 0.1 10*3/uL (ref 0.0–0.1)
Basophils Relative: 1 %
Eosinophils Absolute: 0.1 10*3/uL (ref 0.0–0.5)
Eosinophils Relative: 2 %
HCT: 29.2 % — ABNORMAL LOW (ref 36.0–46.0)
Hemoglobin: 9.3 g/dL — ABNORMAL LOW (ref 12.0–15.0)
Immature Granulocytes: 1 %
Lymphocytes Relative: 22 %
Lymphs Abs: 1.3 10*3/uL (ref 0.7–4.0)
MCH: 32.5 pg (ref 26.0–34.0)
MCHC: 31.8 g/dL (ref 30.0–36.0)
MCV: 102.1 fL — ABNORMAL HIGH (ref 80.0–100.0)
Monocytes Absolute: 0.7 10*3/uL (ref 0.1–1.0)
Monocytes Relative: 12 %
Neutro Abs: 3.7 10*3/uL (ref 1.7–7.7)
Neutrophils Relative %: 62 %
Platelet Count: 490 10*3/uL — ABNORMAL HIGH (ref 150–400)
RBC: 2.86 MIL/uL — ABNORMAL LOW (ref 3.87–5.11)
RDW: 14.7 % (ref 11.5–15.5)
WBC Count: 5.9 10*3/uL (ref 4.0–10.5)
nRBC: 0 % (ref 0.0–0.2)

## 2020-11-10 LAB — CMP (CANCER CENTER ONLY)
ALT: 14 U/L (ref 0–44)
AST: 21 U/L (ref 15–41)
Albumin: 3.8 g/dL (ref 3.5–5.0)
Alkaline Phosphatase: 82 U/L (ref 38–126)
Anion gap: 9 (ref 5–15)
BUN: 22 mg/dL (ref 8–23)
CO2: 28 mmol/L (ref 22–32)
Calcium: 10.4 mg/dL — ABNORMAL HIGH (ref 8.9–10.3)
Chloride: 104 mmol/L (ref 98–111)
Creatinine: 1.26 mg/dL — ABNORMAL HIGH (ref 0.44–1.00)
GFR, Estimated: 45 mL/min — ABNORMAL LOW (ref >60)
Glucose, Bld: 156 mg/dL — ABNORMAL HIGH (ref 70–99)
Potassium: 3.9 mmol/L (ref 3.5–5.1)
Sodium: 141 mmol/L (ref 135–145)
Total Bilirubin: 0.3 mg/dL (ref 0.3–1.2)
Total Protein: 7.5 g/dL (ref 6.5–8.1)

## 2020-11-10 LAB — RETICULOCYTES
Immature Retic Fract: 23.3 % — ABNORMAL HIGH (ref 2.3–15.9)
RBC.: 2.9 MIL/uL — ABNORMAL LOW (ref 3.87–5.11)
Retic Count, Absolute: 111.9 10*3/uL (ref 19.0–186.0)
Retic Ct Pct: 3.9 % — ABNORMAL HIGH (ref 0.4–3.1)

## 2020-11-10 LAB — SAMPLE TO BLOOD BANK

## 2020-11-10 MED ORDER — B COMPLEX VITAMINS PO CAPS: 1.0000 | ORAL_CAPSULE | Freq: Every day | ORAL | 1 refills | Status: DC

## 2020-11-10 MED ORDER — POLYSACCHARIDE IRON COMPLEX 150 MG PO CAPS: 150.0000 mg | ORAL_CAPSULE | Freq: Every day | ORAL | 1 refills | Status: DC

## 2020-11-11 ENCOUNTER — Encounter: Payer: Medicare Other | Admitting: Orthopedic Surgery

## 2020-11-12 ENCOUNTER — Ambulatory Visit: Payer: Medicare Other | Admitting: Hematology

## 2020-11-12 ENCOUNTER — Other Ambulatory Visit: Payer: Medicare Other

## 2020-11-12 NOTE — Telephone Encounter (Signed)
Called Dr. Carlean Jews office to inform of MD recommendations.  Dr. Marcelino Scot and his PA are both in surgery today unable to speak with them.  Inquired about type of surgery pt will have; pt has a humeral fracture.  Per front office staff pt had surgery either yesterday or today.  I left a message for Dr. Marcelino Scot or his PA to call our office.

## 2020-11-13 ENCOUNTER — Ambulatory Visit: Payer: Medicare Other

## 2020-11-15 ENCOUNTER — Encounter: Payer: Self-pay | Admitting: Hematology

## 2020-11-15 ENCOUNTER — Telehealth: Payer: Self-pay | Admitting: Hematology

## 2020-11-15 DIAGNOSIS — D509 Iron deficiency anemia, unspecified: Secondary | ICD-10-CM | POA: Insufficient documentation

## 2020-11-15 MED ORDER — HYDROXYUREA 500 MG PO CAPS: ORAL_CAPSULE | ORAL | 1 refills | Status: DC

## 2020-11-15 NOTE — Telephone Encounter (Signed)
Scheduled follow-up appointments per 8/24 los. Patient's husband is aware.
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
## 2020-11-17 ENCOUNTER — Other Ambulatory Visit: Payer: Self-pay

## 2020-11-17 ENCOUNTER — Inpatient Hospital Stay: Payer: Medicare Other

## 2020-11-17 VITALS — BP 156/52 | HR 60 | Temp 98.3°F | Resp 18

## 2020-11-17 DIAGNOSIS — D509 Iron deficiency anemia, unspecified: Secondary | ICD-10-CM

## 2020-11-17 DIAGNOSIS — D473 Essential (hemorrhagic) thrombocythemia: Secondary | ICD-10-CM | POA: Diagnosis not present

## 2020-11-17 DIAGNOSIS — D62 Acute posthemorrhagic anemia: Secondary | ICD-10-CM | POA: Diagnosis not present

## 2020-11-17 MED ORDER — SODIUM CHLORIDE 0.9 % IV SOLN: Freq: Once | INTRAVENOUS | Status: AC

## 2020-11-17 MED ORDER — SODIUM CHLORIDE 0.9 % IV SOLN
300.0000 mg | Freq: Once | INTRAVENOUS | Status: AC
  Administered 2020-11-17: 300 mg via INTRAVENOUS
  Filled 2020-11-17: qty 300

## 2020-11-17 MED ORDER — ACETAMINOPHEN 325 MG PO TABS
650.0000 mg | ORAL_TABLET | Freq: Once | ORAL | Status: AC
  Administered 2020-11-17: 650 mg via ORAL
  Filled 2020-11-17: qty 2

## 2020-11-17 MED ORDER — LORATADINE 10 MG PO TABS
10.0000 mg | ORAL_TABLET | Freq: Once | ORAL | Status: AC
  Administered 2020-11-17: 10 mg via ORAL
  Filled 2020-11-17: qty 1

## 2020-11-17 NOTE — Patient Instructions (Signed)

## 2020-11-18 DIAGNOSIS — E785 Hyperlipidemia, unspecified: Secondary | ICD-10-CM | POA: Diagnosis not present

## 2020-11-18 DIAGNOSIS — R300 Dysuria: Secondary | ICD-10-CM | POA: Diagnosis not present

## 2020-11-18 DIAGNOSIS — E1151 Type 2 diabetes mellitus with diabetic peripheral angiopathy without gangrene: Secondary | ICD-10-CM | POA: Diagnosis not present

## 2020-11-18 DIAGNOSIS — G2581 Restless legs syndrome: Secondary | ICD-10-CM | POA: Diagnosis not present

## 2020-11-18 DIAGNOSIS — F17210 Nicotine dependence, cigarettes, uncomplicated: Secondary | ICD-10-CM | POA: Diagnosis not present

## 2020-11-20 ENCOUNTER — Inpatient Hospital Stay: Payer: Medicare Other | Attending: Hematology

## 2020-11-20 ENCOUNTER — Other Ambulatory Visit: Payer: Self-pay

## 2020-11-20 VITALS — BP 151/61 | HR 66 | Temp 99.0°F | Resp 16

## 2020-11-20 DIAGNOSIS — I2510 Atherosclerotic heart disease of native coronary artery without angina pectoris: Secondary | ICD-10-CM | POA: Insufficient documentation

## 2020-11-20 DIAGNOSIS — Z833 Family history of diabetes mellitus: Secondary | ICD-10-CM | POA: Diagnosis not present

## 2020-11-20 DIAGNOSIS — N1832 Chronic kidney disease, stage 3b: Secondary | ICD-10-CM | POA: Insufficient documentation

## 2020-11-20 DIAGNOSIS — M797 Fibromyalgia: Secondary | ICD-10-CM | POA: Insufficient documentation

## 2020-11-20 DIAGNOSIS — E1122 Type 2 diabetes mellitus with diabetic chronic kidney disease: Secondary | ICD-10-CM | POA: Insufficient documentation

## 2020-11-20 DIAGNOSIS — D649 Anemia, unspecified: Secondary | ICD-10-CM | POA: Insufficient documentation

## 2020-11-20 DIAGNOSIS — Z79899 Other long term (current) drug therapy: Secondary | ICD-10-CM | POA: Diagnosis not present

## 2020-11-20 DIAGNOSIS — I509 Heart failure, unspecified: Secondary | ICD-10-CM | POA: Insufficient documentation

## 2020-11-20 DIAGNOSIS — M1990 Unspecified osteoarthritis, unspecified site: Secondary | ICD-10-CM | POA: Insufficient documentation

## 2020-11-20 DIAGNOSIS — D473 Essential (hemorrhagic) thrombocythemia: Secondary | ICD-10-CM | POA: Insufficient documentation

## 2020-11-20 DIAGNOSIS — M549 Dorsalgia, unspecified: Secondary | ICD-10-CM | POA: Diagnosis not present

## 2020-11-20 DIAGNOSIS — Z809 Family history of malignant neoplasm, unspecified: Secondary | ICD-10-CM | POA: Diagnosis not present

## 2020-11-20 DIAGNOSIS — Z806 Family history of leukemia: Secondary | ICD-10-CM | POA: Insufficient documentation

## 2020-11-20 DIAGNOSIS — G2581 Restless legs syndrome: Secondary | ICD-10-CM | POA: Insufficient documentation

## 2020-11-20 DIAGNOSIS — Z87891 Personal history of nicotine dependence: Secondary | ICD-10-CM | POA: Insufficient documentation

## 2020-11-20 DIAGNOSIS — D509 Iron deficiency anemia, unspecified: Secondary | ICD-10-CM

## 2020-11-20 DIAGNOSIS — R109 Unspecified abdominal pain: Secondary | ICD-10-CM | POA: Insufficient documentation

## 2020-11-20 DIAGNOSIS — Z8249 Family history of ischemic heart disease and other diseases of the circulatory system: Secondary | ICD-10-CM | POA: Diagnosis not present

## 2020-11-20 MED ORDER — SODIUM CHLORIDE 0.9 % IV SOLN
Freq: Once | INTRAVENOUS | Status: AC
Start: 2020-11-20 — End: 2020-11-20

## 2020-11-20 MED ORDER — LORATADINE 10 MG PO TABS
10.0000 mg | ORAL_TABLET | Freq: Once | ORAL | Status: AC
  Administered 2020-11-20: 10 mg via ORAL

## 2020-11-20 MED ORDER — SODIUM CHLORIDE 0.9 % IV SOLN
300.0000 mg | Freq: Once | INTRAVENOUS | Status: AC
  Administered 2020-11-20: 300 mg via INTRAVENOUS
  Filled 2020-11-20: qty 300

## 2020-11-20 MED ORDER — ACETAMINOPHEN 325 MG PO TABS
ORAL_TABLET | ORAL | Status: AC
  Filled 2020-11-20: qty 2

## 2020-11-20 MED ORDER — LORATADINE 10 MG PO TABS
ORAL_TABLET | ORAL | Status: AC
  Filled 2020-11-20: qty 1

## 2020-11-20 MED ORDER — ACETAMINOPHEN 325 MG PO TABS
650.0000 mg | ORAL_TABLET | Freq: Once | ORAL | Status: AC
  Administered 2020-11-20: 650 mg via ORAL

## 2020-11-20 NOTE — Patient Instructions (Signed)

## 2020-11-24 DIAGNOSIS — S42241D 4-part fracture of surgical neck of right humerus, subsequent encounter for fracture with routine healing: Secondary | ICD-10-CM | POA: Diagnosis not present

## 2020-11-25 ENCOUNTER — Telehealth: Payer: Self-pay | Admitting: Internal Medicine

## 2020-11-25 NOTE — Telephone Encounter (Signed)
Patient's husband would like to know if the patient still needs to be seen by Dr. Gwenlyn Found. Patient was scheduled for post angioplasty with stent on 7/08, but it was cancelled due to patient being hospitalized at that time. May need updated referral. Patient's husband also states the patient will be having a procedure on her back soon--pending cardiac clearance. He plans to have the requesting office contact us to place a formal clearance. He also scheduled an appointment for 02/28/21 (reschedule from 7/06).

## 2020-11-26 NOTE — Telephone Encounter (Signed)
Called and spoke with pt husband (ok per DPR).  He wants to know if pt needs to f/u with Dr. Gwenlyn Found.  I advised him that she does and scheduled a NP PV OV for 01/21/21 at 8:30 am.  No further questions or concerns.

## 2020-11-30 DIAGNOSIS — I2510 Atherosclerotic heart disease of native coronary artery without angina pectoris: Secondary | ICD-10-CM | POA: Diagnosis not present

## 2020-11-30 DIAGNOSIS — S42301A Unspecified fracture of shaft of humerus, right arm, initial encounter for closed fracture: Secondary | ICD-10-CM | POA: Diagnosis not present

## 2020-11-30 DIAGNOSIS — I5041 Acute combined systolic (congestive) and diastolic (congestive) heart failure: Secondary | ICD-10-CM | POA: Diagnosis not present

## 2020-11-30 DIAGNOSIS — E785 Hyperlipidemia, unspecified: Secondary | ICD-10-CM | POA: Diagnosis not present

## 2020-12-01 DIAGNOSIS — N3020 Other chronic cystitis without hematuria: Secondary | ICD-10-CM | POA: Diagnosis not present

## 2020-12-01 DIAGNOSIS — R350 Frequency of micturition: Secondary | ICD-10-CM | POA: Diagnosis not present

## 2020-12-01 DIAGNOSIS — R3915 Urgency of urination: Secondary | ICD-10-CM | POA: Diagnosis not present

## 2020-12-01 DIAGNOSIS — R351 Nocturia: Secondary | ICD-10-CM | POA: Diagnosis not present

## 2020-12-03 DIAGNOSIS — I5033 Acute on chronic diastolic (congestive) heart failure: Secondary | ICD-10-CM | POA: Diagnosis not present

## 2020-12-03 DIAGNOSIS — I130 Hypertensive heart and chronic kidney disease with heart failure and stage 1 through stage 4 chronic kidney disease, or unspecified chronic kidney disease: Secondary | ICD-10-CM | POA: Diagnosis not present

## 2020-12-03 DIAGNOSIS — R601 Generalized edema: Secondary | ICD-10-CM | POA: Diagnosis not present

## 2020-12-03 DIAGNOSIS — N3020 Other chronic cystitis without hematuria: Secondary | ICD-10-CM | POA: Diagnosis not present

## 2020-12-03 DIAGNOSIS — E1151 Type 2 diabetes mellitus with diabetic peripheral angiopathy without gangrene: Secondary | ICD-10-CM | POA: Diagnosis not present

## 2020-12-07 DIAGNOSIS — M961 Postlaminectomy syndrome, not elsewhere classified: Secondary | ICD-10-CM | POA: Diagnosis not present

## 2020-12-09 ENCOUNTER — Inpatient Hospital Stay: Payer: Medicare Other

## 2020-12-09 ENCOUNTER — Inpatient Hospital Stay (HOSPITAL_BASED_OUTPATIENT_CLINIC_OR_DEPARTMENT_OTHER): Payer: Medicare Other | Admitting: Hematology

## 2020-12-09 ENCOUNTER — Other Ambulatory Visit: Payer: Self-pay | Admitting: Hematology

## 2020-12-09 ENCOUNTER — Other Ambulatory Visit: Payer: Self-pay

## 2020-12-09 VITALS — BP 147/44 | HR 63 | Temp 97.6°F | Resp 18

## 2020-12-09 DIAGNOSIS — D471 Chronic myeloproliferative disease: Secondary | ICD-10-CM

## 2020-12-09 DIAGNOSIS — D509 Iron deficiency anemia, unspecified: Secondary | ICD-10-CM

## 2020-12-09 DIAGNOSIS — I509 Heart failure, unspecified: Secondary | ICD-10-CM | POA: Diagnosis not present

## 2020-12-09 DIAGNOSIS — N1832 Chronic kidney disease, stage 3b: Secondary | ICD-10-CM | POA: Diagnosis not present

## 2020-12-09 DIAGNOSIS — Z87891 Personal history of nicotine dependence: Secondary | ICD-10-CM | POA: Diagnosis not present

## 2020-12-09 DIAGNOSIS — D473 Essential (hemorrhagic) thrombocythemia: Secondary | ICD-10-CM

## 2020-12-09 DIAGNOSIS — Z8249 Family history of ischemic heart disease and other diseases of the circulatory system: Secondary | ICD-10-CM | POA: Diagnosis not present

## 2020-12-09 DIAGNOSIS — Z806 Family history of leukemia: Secondary | ICD-10-CM | POA: Diagnosis not present

## 2020-12-09 DIAGNOSIS — R109 Unspecified abdominal pain: Secondary | ICD-10-CM | POA: Diagnosis not present

## 2020-12-09 DIAGNOSIS — E1122 Type 2 diabetes mellitus with diabetic chronic kidney disease: Secondary | ICD-10-CM | POA: Diagnosis not present

## 2020-12-09 DIAGNOSIS — M1990 Unspecified osteoarthritis, unspecified site: Secondary | ICD-10-CM | POA: Diagnosis not present

## 2020-12-09 DIAGNOSIS — M549 Dorsalgia, unspecified: Secondary | ICD-10-CM | POA: Diagnosis not present

## 2020-12-09 DIAGNOSIS — G2581 Restless legs syndrome: Secondary | ICD-10-CM | POA: Diagnosis not present

## 2020-12-09 DIAGNOSIS — D649 Anemia, unspecified: Secondary | ICD-10-CM | POA: Diagnosis not present

## 2020-12-09 DIAGNOSIS — M797 Fibromyalgia: Secondary | ICD-10-CM | POA: Diagnosis not present

## 2020-12-09 DIAGNOSIS — I2510 Atherosclerotic heart disease of native coronary artery without angina pectoris: Secondary | ICD-10-CM | POA: Diagnosis not present

## 2020-12-09 DIAGNOSIS — Z833 Family history of diabetes mellitus: Secondary | ICD-10-CM | POA: Diagnosis not present

## 2020-12-09 DIAGNOSIS — Z79899 Other long term (current) drug therapy: Secondary | ICD-10-CM | POA: Diagnosis not present

## 2020-12-09 LAB — CMP (CANCER CENTER ONLY)
ALT: 14 U/L (ref 0–44)
AST: 20 U/L (ref 15–41)
Albumin: 4.1 g/dL (ref 3.5–5.0)
Alkaline Phosphatase: 114 U/L (ref 38–126)
Anion gap: 9 (ref 5–15)
BUN: 42 mg/dL — ABNORMAL HIGH (ref 8–23)
CO2: 26 mmol/L (ref 22–32)
Calcium: 10.3 mg/dL (ref 8.9–10.3)
Chloride: 104 mmol/L (ref 98–111)
Creatinine: 1.96 mg/dL — ABNORMAL HIGH (ref 0.44–1.00)
GFR, Estimated: 27 mL/min — ABNORMAL LOW (ref >60)
Glucose, Bld: 176 mg/dL — ABNORMAL HIGH (ref 70–99)
Potassium: 3.7 mmol/L (ref 3.5–5.1)
Sodium: 139 mmol/L (ref 135–145)
Total Bilirubin: 0.2 mg/dL — ABNORMAL LOW (ref 0.3–1.2)
Total Protein: 7.6 g/dL (ref 6.5–8.1)

## 2020-12-09 LAB — CBC WITH DIFFERENTIAL/PLATELET
Abs Immature Granulocytes: 0.01 10*3/uL (ref 0.00–0.07)
Basophils Absolute: 0.1 10*3/uL (ref 0.0–0.1)
Basophils Relative: 1 %
Eosinophils Absolute: 0.1 10*3/uL (ref 0.0–0.5)
Eosinophils Relative: 2 %
HCT: 33.9 % — ABNORMAL LOW (ref 36.0–46.0)
Hemoglobin: 11.3 g/dL — ABNORMAL LOW (ref 12.0–15.0)
Immature Granulocytes: 0 %
Lymphocytes Relative: 24 %
Lymphs Abs: 1.4 10*3/uL (ref 0.7–4.0)
MCH: 33.3 pg (ref 26.0–34.0)
MCHC: 33.3 g/dL (ref 30.0–36.0)
MCV: 100 fL (ref 80.0–100.0)
Monocytes Absolute: 0.4 10*3/uL (ref 0.1–1.0)
Monocytes Relative: 7 %
Neutro Abs: 3.7 10*3/uL (ref 1.7–7.7)
Neutrophils Relative %: 66 %
Platelets: 399 10*3/uL (ref 150–400)
RBC: 3.39 MIL/uL — ABNORMAL LOW (ref 3.87–5.11)
RDW: 16.2 % — ABNORMAL HIGH (ref 11.5–15.5)
WBC: 5.6 10*3/uL (ref 4.0–10.5)
nRBC: 0 % (ref 0.0–0.2)

## 2020-12-09 LAB — IRON AND TIBC
Iron: 54 ug/dL (ref 41–142)
Saturation Ratios: 13 % — ABNORMAL LOW (ref 21–57)
TIBC: 430 ug/dL (ref 236–444)
UIBC: 376 ug/dL (ref 120–384)

## 2020-12-09 LAB — VITAMIN B12: Vitamin B-12: 600 pg/mL (ref 180–914)

## 2020-12-09 LAB — FERRITIN: Ferritin: 341 ng/mL — ABNORMAL HIGH (ref 11–307)

## 2020-12-14 DIAGNOSIS — I130 Hypertensive heart and chronic kidney disease with heart failure and stage 1 through stage 4 chronic kidney disease, or unspecified chronic kidney disease: Secondary | ICD-10-CM | POA: Diagnosis not present

## 2020-12-14 DIAGNOSIS — R601 Generalized edema: Secondary | ICD-10-CM | POA: Diagnosis not present

## 2020-12-14 DIAGNOSIS — I5033 Acute on chronic diastolic (congestive) heart failure: Secondary | ICD-10-CM | POA: Diagnosis not present

## 2020-12-14 DIAGNOSIS — N1832 Chronic kidney disease, stage 3b: Secondary | ICD-10-CM | POA: Diagnosis not present

## 2020-12-15 ENCOUNTER — Encounter: Payer: Self-pay | Admitting: Hematology
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
## 2020-12-16 ENCOUNTER — Other Ambulatory Visit: Payer: Self-pay | Admitting: *Deleted

## 2020-12-16 DIAGNOSIS — R3129 Other microscopic hematuria: Secondary | ICD-10-CM

## 2020-12-22 DIAGNOSIS — S42241D 4-part fracture of surgical neck of right humerus, subsequent encounter for fracture with routine healing: Secondary | ICD-10-CM | POA: Diagnosis not present

## 2020-12-24 DIAGNOSIS — Z1231 Encounter for screening mammogram for malignant neoplasm of breast: Secondary | ICD-10-CM | POA: Diagnosis not present

## 2020-12-30 DIAGNOSIS — I5041 Acute combined systolic (congestive) and diastolic (congestive) heart failure: Secondary | ICD-10-CM | POA: Diagnosis not present

## 2020-12-30 DIAGNOSIS — I2510 Atherosclerotic heart disease of native coronary artery without angina pectoris: Secondary | ICD-10-CM | POA: Diagnosis not present

## 2020-12-30 DIAGNOSIS — S42301A Unspecified fracture of shaft of humerus, right arm, initial encounter for closed fracture: Secondary | ICD-10-CM | POA: Diagnosis not present

## 2020-12-30 DIAGNOSIS — E785 Hyperlipidemia, unspecified: Secondary | ICD-10-CM | POA: Diagnosis not present

## 2021-01-03 ENCOUNTER — Other Ambulatory Visit: Payer: Self-pay

## 2021-01-03 ENCOUNTER — Ambulatory Visit
Admission: RE | Admit: 2021-01-03 | Discharge: 2021-01-03 | Disposition: A | Discharge status: Home / Self Care | Payer: Medicare Other | Source: Ambulatory Visit | Attending: *Deleted | Admitting: *Deleted

## 2021-01-03 DIAGNOSIS — R319 Hematuria, unspecified: Secondary | ICD-10-CM | POA: Diagnosis not present

## 2021-01-03 DIAGNOSIS — R3129 Other microscopic hematuria: Secondary | ICD-10-CM

## 2021-01-03 DIAGNOSIS — N200 Calculus of kidney: Secondary | ICD-10-CM | POA: Diagnosis not present

## 2021-01-03 DIAGNOSIS — K5730 Diverticulosis of large intestine without perforation or abscess without bleeding: Secondary | ICD-10-CM | POA: Diagnosis not present

## 2021-01-04 ENCOUNTER — Other Ambulatory Visit: Payer: Self-pay | Admitting: Hematology

## 2021-01-05 ENCOUNTER — Encounter: Payer: Self-pay | Admitting: Hematology

## 2021-01-17 DIAGNOSIS — C44311 Basal cell carcinoma of skin of nose: Secondary | ICD-10-CM | POA: Diagnosis not present

## 2021-01-21 ENCOUNTER — Ambulatory Visit: Payer: Medicare Other | Admitting: Cardiovascular Disease

## 2021-01-26 DIAGNOSIS — R0782 Intercostal pain: Secondary | ICD-10-CM | POA: Diagnosis not present

## 2021-01-26 DIAGNOSIS — S42241D 4-part fracture of surgical neck of right humerus, subsequent encounter for fracture with routine healing: Secondary | ICD-10-CM | POA: Diagnosis not present

## 2021-01-26 DIAGNOSIS — R102 Pelvic and perineal pain: Secondary | ICD-10-CM | POA: Diagnosis not present

## 2021-01-26 DIAGNOSIS — M85852 Other specified disorders of bone density and structure, left thigh: Secondary | ICD-10-CM | POA: Diagnosis not present

## 2021-01-26 DIAGNOSIS — Z780 Asymptomatic menopausal state: Secondary | ICD-10-CM | POA: Diagnosis not present

## 2021-01-26 DIAGNOSIS — M85851 Other specified disorders of bone density and structure, right thigh: Secondary | ICD-10-CM | POA: Diagnosis not present

## 2021-01-30 DIAGNOSIS — E785 Hyperlipidemia, unspecified: Secondary | ICD-10-CM | POA: Diagnosis not present

## 2021-01-30 DIAGNOSIS — I5041 Acute combined systolic (congestive) and diastolic (congestive) heart failure: Secondary | ICD-10-CM | POA: Diagnosis not present

## 2021-01-30 DIAGNOSIS — S42301A Unspecified fracture of shaft of humerus, right arm, initial encounter for closed fracture: Secondary | ICD-10-CM | POA: Diagnosis not present

## 2021-01-30 DIAGNOSIS — I2510 Atherosclerotic heart disease of native coronary artery without angina pectoris: Secondary | ICD-10-CM | POA: Diagnosis not present

## 2021-02-03 ENCOUNTER — Telehealth: Payer: Self-pay | Admitting: Hematology

## 2021-02-03 NOTE — Telephone Encounter (Signed)
Sch per 11/17 inbasket, pt husband aware
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
## 2021-02-04 ENCOUNTER — Ambulatory Visit: Payer: Medicare Other | Admitting: Internal Medicine

## 2021-02-04 ENCOUNTER — Inpatient Hospital Stay: Payer: Medicare Other | Admitting: Hematology

## 2021-02-04 ENCOUNTER — Inpatient Hospital Stay: Payer: Medicare Other

## 2021-02-08 ENCOUNTER — Other Ambulatory Visit: Payer: Self-pay | Admitting: Hematology

## 2021-02-09 ENCOUNTER — Telehealth: Payer: Self-pay | Admitting: Internal Medicine

## 2021-02-09 ENCOUNTER — Encounter: Payer: Self-pay | Admitting: Hematology

## 2021-02-09 MED ORDER — TICAGRELOR 90 MG PO TABS: 90.0000 mg | ORAL_TABLET | Freq: Two times a day (BID) | ORAL | 1 refills | Status: DC

## 2021-02-09 NOTE — Telephone Encounter (Signed)
Pt's medication was sent to pt's pharmacy as requested. Confirmation received.  °

## 2021-02-09 NOTE — Telephone Encounter (Signed)
 *  STAT* If patient is at the pharmacy, call can be transferred to refill team.   1. Which medications need to be refilled? (please list name of each medication and dose if known) ticagrelor (BRILINTA) 90 MG TABS tablet  2. Which pharmacy/location (including street and city if local pharmacy) is medication to be sent to? [facility] mail order pharmacy,  fax# [phone]  3. Do they need a 30 day or 90 day supply? 90 days

## 2021-02-16 ENCOUNTER — Other Ambulatory Visit: Payer: Self-pay

## 2021-02-16 DIAGNOSIS — Z4802 Encounter for removal of sutures: Secondary | ICD-10-CM | POA: Diagnosis not present

## 2021-02-16 DIAGNOSIS — D471 Chronic myeloproliferative disease: Secondary | ICD-10-CM

## 2021-02-16 DIAGNOSIS — Z85828 Personal history of other malignant neoplasm of skin: Secondary | ICD-10-CM | POA: Diagnosis not present

## 2021-02-17 ENCOUNTER — Inpatient Hospital Stay: Payer: Medicare Other | Attending: Hematology

## 2021-02-17 ENCOUNTER — Other Ambulatory Visit: Payer: Self-pay

## 2021-02-17 ENCOUNTER — Inpatient Hospital Stay (HOSPITAL_BASED_OUTPATIENT_CLINIC_OR_DEPARTMENT_OTHER): Payer: Medicare Other | Admitting: Hematology

## 2021-02-17 VITALS — BP 133/60 | HR 50 | Temp 97.5°F | Resp 18

## 2021-02-17 DIAGNOSIS — D471 Chronic myeloproliferative disease: Secondary | ICD-10-CM

## 2021-02-17 DIAGNOSIS — N189 Chronic kidney disease, unspecified: Secondary | ICD-10-CM | POA: Diagnosis not present

## 2021-02-17 DIAGNOSIS — D473 Essential (hemorrhagic) thrombocythemia: Secondary | ICD-10-CM

## 2021-02-17 DIAGNOSIS — D649 Anemia, unspecified: Secondary | ICD-10-CM | POA: Insufficient documentation

## 2021-02-17 LAB — CMP (CANCER CENTER ONLY)
ALT: 20 U/L (ref 0–44)
AST: 24 U/L (ref 15–41)
Albumin: 3.5 g/dL (ref 3.5–5.0)
Alkaline Phosphatase: 106 U/L (ref 38–126)
Anion gap: 8 (ref 5–15)
BUN: 27 mg/dL — ABNORMAL HIGH (ref 8–23)
CO2: 26 mmol/L (ref 22–32)
Calcium: 9.7 mg/dL (ref 8.9–10.3)
Chloride: 106 mmol/L (ref 98–111)
Creatinine: 1.72 mg/dL — ABNORMAL HIGH (ref 0.44–1.00)
GFR, Estimated: 31 mL/min — ABNORMAL LOW (ref >60)
Glucose, Bld: 136 mg/dL — ABNORMAL HIGH (ref 70–99)
Potassium: 4.6 mmol/L (ref 3.5–5.1)
Sodium: 140 mmol/L (ref 135–145)
Total Bilirubin: 0.4 mg/dL (ref 0.3–1.2)
Total Protein: 7 g/dL (ref 6.5–8.1)

## 2021-02-17 LAB — CBC WITH DIFFERENTIAL (CANCER CENTER ONLY)
Abs Immature Granulocytes: 0.02 10*3/uL (ref 0.00–0.07)
Basophils Absolute: 0 10*3/uL (ref 0.0–0.1)
Basophils Relative: 1 %
Eosinophils Absolute: 0.1 10*3/uL (ref 0.0–0.5)
Eosinophils Relative: 1 %
HCT: 39.3 % (ref 36.0–46.0)
Hemoglobin: 12.7 g/dL (ref 12.0–15.0)
Immature Granulocytes: 0 %
Lymphocytes Relative: 17 %
Lymphs Abs: 1.1 10*3/uL (ref 0.7–4.0)
MCH: 32.4 pg (ref 26.0–34.0)
MCHC: 32.3 g/dL (ref 30.0–36.0)
MCV: 100.3 fL — ABNORMAL HIGH (ref 80.0–100.0)
Monocytes Absolute: 0.5 10*3/uL (ref 0.1–1.0)
Monocytes Relative: 7 %
Neutro Abs: 4.9 10*3/uL (ref 1.7–7.7)
Neutrophils Relative %: 74 %
Platelet Count: 351 10*3/uL (ref 150–400)
RBC: 3.92 MIL/uL (ref 3.87–5.11)
RDW: 18 % — ABNORMAL HIGH (ref 11.5–15.5)
WBC Count: 6.6 10*3/uL (ref 4.0–10.5)
nRBC: 0 % (ref 0.0–0.2)

## 2021-02-17 LAB — IRON AND TIBC
Iron: 116 ug/dL (ref 41–142)
Saturation Ratios: 29 % (ref 21–57)
TIBC: 400 ug/dL (ref 236–444)
UIBC: 284 ug/dL (ref 120–384)

## 2021-02-17 LAB — FERRITIN: Ferritin: 289 ng/mL (ref 11–307)

## 2021-02-17 LAB — VITAMIN B12: Vitamin B-12: 650 pg/mL (ref 180–914)

## 2021-02-21 DIAGNOSIS — Z9181 History of falling: Secondary | ICD-10-CM | POA: Diagnosis not present

## 2021-02-21 DIAGNOSIS — Z87891 Personal history of nicotine dependence: Secondary | ICD-10-CM | POA: Diagnosis not present

## 2021-02-21 DIAGNOSIS — M961 Postlaminectomy syndrome, not elsewhere classified: Secondary | ICD-10-CM | POA: Diagnosis not present

## 2021-02-21 DIAGNOSIS — S42301D Unspecified fracture of shaft of humerus, right arm, subsequent encounter for fracture with routine healing: Secondary | ICD-10-CM | POA: Diagnosis not present

## 2021-02-21 DIAGNOSIS — Z79899 Other long term (current) drug therapy: Secondary | ICD-10-CM | POA: Diagnosis not present

## 2021-02-21 DIAGNOSIS — I2510 Atherosclerotic heart disease of native coronary artery without angina pectoris: Secondary | ICD-10-CM | POA: Diagnosis not present

## 2021-02-21 DIAGNOSIS — E119 Type 2 diabetes mellitus without complications: Secondary | ICD-10-CM | POA: Diagnosis not present

## 2021-02-21 DIAGNOSIS — Z7982 Long term (current) use of aspirin: Secondary | ICD-10-CM | POA: Diagnosis not present

## 2021-02-21 DIAGNOSIS — G2581 Restless legs syndrome: Secondary | ICD-10-CM | POA: Diagnosis not present

## 2021-02-21 DIAGNOSIS — Z981 Arthrodesis status: Secondary | ICD-10-CM | POA: Diagnosis not present

## 2021-02-21 DIAGNOSIS — Z794 Long term (current) use of insulin: Secondary | ICD-10-CM | POA: Diagnosis not present

## 2021-02-23 ENCOUNTER — Other Ambulatory Visit: Payer: Self-pay

## 2021-02-23 ENCOUNTER — Encounter: Payer: Self-pay | Admitting: Hematology

## 2021-02-23 ENCOUNTER — Ambulatory Visit (INDEPENDENT_AMBULATORY_CARE_PROVIDER_SITE_OTHER): Payer: Medicare Other | Admitting: Cardiovascular Disease

## 2021-02-23 ENCOUNTER — Encounter: Payer: Self-pay | Admitting: Cardiovascular Disease

## 2021-02-23 VITALS — BP 184/79 | HR 70 | Ht 64.0 in | Wt 158.0 lb

## 2021-02-23 DIAGNOSIS — I10 Essential (primary) hypertension: Secondary | ICD-10-CM | POA: Diagnosis not present

## 2021-02-23 DIAGNOSIS — I739 Peripheral vascular disease, unspecified: Secondary | ICD-10-CM | POA: Insufficient documentation

## 2021-02-23 DIAGNOSIS — Z95820 Peripheral vascular angioplasty status with implants and grafts: Secondary | ICD-10-CM

## 2021-02-23 MED ORDER — CILOSTAZOL 50 MG PO TABS: 50.0000 mg | ORAL_TABLET | Freq: Two times a day (BID) | ORAL | 2 refills | Status: DC

## 2021-02-23 NOTE — Progress Notes (Signed)
02/23/2021 Anna [name]   23-Dec-1950  [id number]  Primary Physician Leanna Battles, MD Primary Cardiologist: Lorretta Harp MD Lupe Carney, [place]  HPI:  Anna [name] is a 70 y.o. mildly overweight married Caucasian female mother of 2, grandmother of 3 grandchildren who is accompanied by her husband Cherlynn Kaiser today.  She is a retired Theme park manager.  She was referred by her cardiologist, Dr.Chandrasekhar for evaluation of PAD.  She has a history of hypertension and diabetes.  She recently had LAD and circumflex obtuse marginal branch stenting by Dr. Ellyn Hack 07/27/2020.  She has chronic renal insufficiency (CKD 3B) Doppler studies performed 08/11/2020 revealed a right ABI of 0.96 and a left of 0.99.  She had a high-frequency signal in her mid and distal right SFA and moderate disease in her left.  She does complain of right greater than left lower extremity lifestyle limiting claudication.  Unfortunately, because of her renal insufficiency, the decision to perform angiography especially diabetic becomes more complicated.   Current Meds  Medication Sig   acetaminophen (TYLENOL) 325 MG tablet Take 325 mg by mouth 3 (three) times daily as needed for moderate pain or mild pain.   ascorbic acid (VITAMIN C) 500 MG tablet Take 500 mg by mouth daily.   aspirin EC 81 MG tablet Take 81 mg by mouth daily. Swallow whole.   atorvastatin (LIPITOR) 40 MG tablet Take 40 mg by mouth daily.    b complex vitamins capsule Take 1 capsule by mouth daily.   calcium carbonate (OSCAL) 1500 (600 Ca) MG TABS tablet Take 600 mg by mouth daily.   cilostazol (PLETAL) 50 MG tablet Take 1 tablet (50 mg total) by mouth 2 (two) times daily.   Cranberry 425 MG CAPS Take 425 mg by mouth daily.   fenofibrate 160 MG tablet Take 1 tablet (160 mg total) by mouth daily.   Glucosamine HCl (GLUCOSAMINE PO) Take 1,000 mg by mouth daily.    hydroxyurea (HYDREA) 500 MG capsule TAKE 1 CAPSULE BY MOUTH ONCE DAILY (MAY  TAKE  WITH   FOOD  TO  MINIMIZE  GI  SIDE  EFFECT)   iron polysaccharides (NIFEREX) 150 MG capsule Take 1 capsule (150 mg total) by mouth daily.   Magnesium 200 MG TABS Take 200 mg by mouth daily.   pregabalin (LYRICA) 50 MG capsule Take 50 mg by mouth 2 (two) times daily.   saccharomyces boulardii (FLORASTOR) 250 MG capsule Take 250 mg by mouth daily.   ticagrelor (BRILINTA) 90 MG TABS tablet Take 1 tablet (90 mg total) by mouth 2 (two) times daily.   TRESIBA FLEXTOUCH 100 UNIT/ML FlexTouch Pen Inject 15 Units into the skin daily. Takes at 8 pm every night   [DISCONTINUED] lidocaine (LIDODERM) 5 % Place 1 patch onto the skin daily. Remove & Discard patch within 12 hours or as directed by MD   [DISCONTINUED] methocarbamol (ROBAXIN) 500 MG tablet Take 1 tablet (500 mg total) by mouth every 6 (six) hours as needed for muscle spasms.   [DISCONTINUED] ondansetron (ZOFRAN) 4 MG tablet Take 1 tablet (4 mg total) by mouth every 8 (eight) hours as needed for nausea or vomiting.   [DISCONTINUED] rOPINIRole (REQUIP) 0.5 MG tablet Take 1 tablet (0.5 mg total) by mouth 2 (two) times daily.   [DISCONTINUED] senna-docusate (SENOKOT-S) 8.6-50 MG tablet Take 1 tablet by mouth at bedtime as needed for mild constipation.     Allergies  Allergen Reactions   Antihistamines, Diphenhydramine-Type Other (See Comments)  unknown   Simvastatin Other (See Comments)    unknown   Benadryl [Diphenhydramine Hcl] Other (See Comments)    chills    Social History   Socioeconomic History   Marital status: Married    Spouse name: Medha Pippen   Number of children: 2   Years of education: 12 +   Highest education level: Associate degree: occupational, Hotel manager, or vocational program  Occupational History   Occupation: retired  Tobacco Use   Smoking status: Former    Packs/day: 0.15    Years: 56.00    Pack years: 8.40    Types: Cigarettes    Quit date: 06/18/2020    Years since quitting: 0.6   Smokeless tobacco: Never    Tobacco comments:    smokes 3/day.   Vaping Use   Vaping Use: Never used  Substance and Sexual Activity   Alcohol use: Never   Drug use: No   Sexual activity: Not on file  Other Topics Concern   Not on file  Social History Narrative   ** Merged History Encounter **       Lives with husband No caffeine   Social Determinants of Radio broadcast assistant Strain: Low Risk    Difficulty of Paying Living Expenses: Not very hard  Food Insecurity: No Food Insecurity   Worried About Charity fundraiser in the Last Year: Never true   Ran Out of Food in the Last Year: Never true  Transportation Needs: No Transportation Needs   Lack of Transportation (Medical): No   Lack of Transportation (Non-Medical): No  Physical Activity: Not on file  Stress: Not on file  Social Connections: Not on file  Intimate Partner Violence: Not on file     Review of Systems: General: negative for chills, fever, night sweats or weight changes.  Cardiovascular: negative for chest pain, dyspnea on exertion, edema, orthopnea, palpitations, paroxysmal nocturnal dyspnea or shortness of breath Dermatological: negative for rash Respiratory: negative for cough or wheezing Urologic: negative for hematuria Abdominal: negative for nausea, vomiting, diarrhea, bright red blood per rectum, melena, or hematemesis Neurologic: negative for visual changes, syncope, or dizziness All other systems reviewed and are otherwise negative except as noted above.    Blood pressure (!) 184/79, pulse 70, height 5\' 4"  (1.626 m), weight 158 lb (71.7 kg), SpO2 99 %.  General appearance: alert and no distress Neck: no adenopathy, no carotid bruit, no JVD, supple, symmetrical, trachea midline, and thyroid not enlarged, symmetric, no tenderness/mass/nodules Lungs: clear to auscultation bilaterally Heart: regular rate and rhythm, S1, S2 normal, no murmur, click, rub or gallop Extremities: extremities normal, atraumatic, no cyanosis or  edema Pulses: 2+ and symmetric Skin: Skin color, texture, turgor normal. No rashes or lesions Neurologic: Grossly normal  EKG sinus rhythm at 70 with LVH voltage and poor R wave progression.  I personally reviewed this EKG.  ASSESSMENT AND PLAN:   Peripheral arterial disease ([place]) Ms. Shadowens was referred to me by Dr.Chandrasekhar for evaluation of PAD.  She had lower extremity arterial Doppler studies performed 08/11/2020 revealing a right ABI of 0.96 and a left of 0.99.  She had a moderate lesion in her mid left SFA and a high-grade lesion in her distal and mid right SFA.  She does complain of right greater than left lower extremity claudication.  The complicating factor is her renal insufficiency with creatinines in the high 1 range along with being a diabetic makes her at high risk for radiocontrast nephropathy.  We  have discussed this risk and she desires to wait for the time being.  I am going to put her on Pletal in the interim.     Lorretta Harp MD FACP,FACC,FAHA, [facility] 02/23/2021 2:18 PM

## 2021-02-23 NOTE — Assessment & Plan Note (Signed)
Anna [name] was referred to me by Dr.Chandrasekhar for evaluation of PAD.  She had lower extremity arterial Doppler studies performed 08/11/2020 revealing a right ABI of 0.96 and a left of 0.99.  She had a moderate lesion in her mid left SFA and a high-grade lesion in her distal and mid right SFA.  She does complain of right greater than left lower extremity claudication.  The complicating factor is her renal insufficiency with creatinines in the high 1 range along with being a diabetic makes her at high risk for radiocontrast nephropathy.  We have discussed this risk and she desires to wait for the time being.  I am going to put her on Pletal in the interim.

## 2021-02-23 NOTE — Patient Instructions (Signed)
Medication Instructions:   -Start taking cilostazol (pletal) 50mg  twice daily.  *If you need a refill on your cardiac medications before your next appointment, please call your pharmacy*   Follow-Up: At [facility], you and your health needs are our priority.  As part of our continuing mission to provide you with exceptional heart care, we have created designated Provider Care Teams.  These Care Teams include your primary Cardiologist (physician) and Advanced Practice Providers (APPs -  Physician Assistants and Nurse Practitioners) who all work together to provide you with the care you need, when you need it.  We recommend signing up for the patient portal called "MyChart".  Sign up information is provided on this After Visit Summary.  MyChart is used to connect with patients for Virtual Visits (Telemedicine).  Patients are able to view lab/test results, encounter notes, upcoming appointments, etc.  Non-urgent messages can be sent to your provider as well.   To learn more about what you can do with MyChart, go to [email or web address].    Your next appointment:   4 month(s)  The format for your next appointment:   In Person  Provider:   Quay Burow, MD
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
## 2021-02-28 ENCOUNTER — Other Ambulatory Visit: Payer: Self-pay

## 2021-02-28 ENCOUNTER — Ambulatory Visit (INDEPENDENT_AMBULATORY_CARE_PROVIDER_SITE_OTHER): Payer: Medicare Other | Admitting: Internal Medicine

## 2021-02-28 ENCOUNTER — Ambulatory Visit: Payer: Medicare Other | Admitting: Internal Medicine

## 2021-02-28 ENCOUNTER — Encounter: Payer: Self-pay | Admitting: Internal Medicine

## 2021-02-28 VITALS — BP 170/60 | HR 54 | Ht 64.0 in | Wt 160.0 lb

## 2021-02-28 DIAGNOSIS — I25118 Atherosclerotic heart disease of native coronary artery with other forms of angina pectoris: Secondary | ICD-10-CM | POA: Diagnosis not present

## 2021-02-28 DIAGNOSIS — N1832 Chronic kidney disease, stage 3b: Secondary | ICD-10-CM | POA: Diagnosis not present

## 2021-02-28 DIAGNOSIS — I5020 Unspecified systolic (congestive) heart failure: Secondary | ICD-10-CM | POA: Insufficient documentation

## 2021-02-28 MED ORDER — HYDRALAZINE HCL 25 MG PO TABS: 25.0000 mg | ORAL_TABLET | Freq: Two times a day (BID) | ORAL | 3 refills | Status: DC

## 2021-02-28 MED ORDER — CLOPIDOGREL BISULFATE 75 MG PO TABS: 75.0000 mg | ORAL_TABLET | Freq: Every day | ORAL | 3 refills | Status: DC

## 2021-02-28 NOTE — Patient Instructions (Signed)
Medication Instructions:  Your physician has recommended you make the following change in your medication:  STOP: Brilinta  START: clopidogrel (Plavix) First dose will be 300 mg or 4 tablets once                 THEN: take clopidogrel 75 mg by mouth daily   START: hydralazine 25 mg by mouth twice daily *If you need a refill on your cardiac medications before your next appointment, please call your pharmacy*   Lab Work: NONE If you have labs (blood work) drawn today and your tests are completely normal, you will receive your results only by: [place] (if you have MyChart) OR A paper copy in the mail If you have any lab test that is abnormal or we need to change your treatment, we will call you to review the results.   Testing/Procedures: Your physician has requested that you have an echocardiogram. Echocardiography is a painless test that uses sound waves to create images of your heart. It provides your doctor with information about the size and shape of your heart and how well your heart's chambers and valves are working. This procedure takes approximately one hour. There are no restrictions for this procedure.    Follow-Up: At [facility], you and your health needs are our priority.  As part of our continuing mission to provide you with exceptional heart care, we have created designated Provider Care Teams.  These Care Teams include your primary Cardiologist (physician) and Advanced Practice Providers (APPs -  Physician Assistants and Nurse Practitioners) who all work together to provide you with the care you need, when you need it.  We recommend signing up for the patient portal called "MyChart".  Sign up information is provided on this After Visit Summary.  MyChart is used to connect with patients for Virtual Visits (Telemedicine).  Patients are able to view lab/test results, encounter notes, upcoming appointments, etc.  Non-urgent messages can be sent to your provider as well.    To learn more about what you can do with MyChart, go to [email or web address].    Your next appointment:   3 month(s)  The format for your next appointment:   In Person  Provider:   Werner Lean, MD  or Melina Copa, PA-C or Ermalinda Barrios, PA-C

## 2021-03-01 DIAGNOSIS — I2510 Atherosclerotic heart disease of native coronary artery without angina pectoris: Secondary | ICD-10-CM | POA: Diagnosis not present

## 2021-03-01 DIAGNOSIS — E785 Hyperlipidemia, unspecified: Secondary | ICD-10-CM | POA: Diagnosis not present

## 2021-03-01 DIAGNOSIS — S42301A Unspecified fracture of shaft of humerus, right arm, initial encounter for closed fracture: Secondary | ICD-10-CM | POA: Diagnosis not present

## 2021-03-01 DIAGNOSIS — I5041 Acute combined systolic (congestive) and diastolic (congestive) heart failure: Secondary | ICD-10-CM | POA: Diagnosis not present

## 2021-03-10 ENCOUNTER — Other Ambulatory Visit: Payer: Self-pay | Admitting: Hematology

## 2021-03-11 ENCOUNTER — Encounter: Payer: Self-pay | Admitting: Hematology

## 2021-03-23 DIAGNOSIS — S42301D Unspecified fracture of shaft of humerus, right arm, subsequent encounter for fracture with routine healing: Secondary | ICD-10-CM | POA: Diagnosis not present

## 2021-03-23 DIAGNOSIS — Z87891 Personal history of nicotine dependence: Secondary | ICD-10-CM | POA: Diagnosis not present

## 2021-03-23 DIAGNOSIS — Z981 Arthrodesis status: Secondary | ICD-10-CM | POA: Diagnosis not present

## 2021-03-23 DIAGNOSIS — Z7982 Long term (current) use of aspirin: Secondary | ICD-10-CM | POA: Diagnosis not present

## 2021-03-23 DIAGNOSIS — Z794 Long term (current) use of insulin: Secondary | ICD-10-CM | POA: Diagnosis not present

## 2021-03-23 DIAGNOSIS — M961 Postlaminectomy syndrome, not elsewhere classified: Secondary | ICD-10-CM | POA: Diagnosis not present

## 2021-03-23 DIAGNOSIS — E119 Type 2 diabetes mellitus without complications: Secondary | ICD-10-CM | POA: Diagnosis not present

## 2021-03-23 DIAGNOSIS — Z79899 Other long term (current) drug therapy: Secondary | ICD-10-CM | POA: Diagnosis not present

## 2021-03-23 DIAGNOSIS — I2510 Atherosclerotic heart disease of native coronary artery without angina pectoris: Secondary | ICD-10-CM | POA: Diagnosis not present

## 2021-03-23 DIAGNOSIS — Z9181 History of falling: Secondary | ICD-10-CM | POA: Diagnosis not present

## 2021-03-23 DIAGNOSIS — G2581 Restless legs syndrome: Secondary | ICD-10-CM | POA: Diagnosis not present

## 2021-03-24 DIAGNOSIS — F17210 Nicotine dependence, cigarettes, uncomplicated: Secondary | ICD-10-CM | POA: Diagnosis not present

## 2021-03-24 DIAGNOSIS — E1151 Type 2 diabetes mellitus with diabetic peripheral angiopathy without gangrene: Secondary | ICD-10-CM | POA: Diagnosis not present

## 2021-03-24 DIAGNOSIS — I739 Peripheral vascular disease, unspecified: Secondary | ICD-10-CM | POA: Diagnosis not present

## 2021-03-24 DIAGNOSIS — E785 Hyperlipidemia, unspecified: Secondary | ICD-10-CM | POA: Diagnosis not present

## 2021-03-25 ENCOUNTER — Other Ambulatory Visit: Payer: Self-pay

## 2021-03-25 ENCOUNTER — Ambulatory Visit (HOSPITAL_COMMUNITY): Payer: Medicare Other | Attending: Cardiology

## 2021-03-25 DIAGNOSIS — I5020 Unspecified systolic (congestive) heart failure: Secondary | ICD-10-CM | POA: Diagnosis not present

## 2021-03-25 LAB — ECHOCARDIOGRAM COMPLETE
Area-P 1/2: 2.69 cm2
S' Lateral: 2.7 cm

## 2021-04-01 DIAGNOSIS — I2510 Atherosclerotic heart disease of native coronary artery without angina pectoris: Secondary | ICD-10-CM | POA: Diagnosis not present

## 2021-04-01 DIAGNOSIS — I5041 Acute combined systolic (congestive) and diastolic (congestive) heart failure: Secondary | ICD-10-CM | POA: Diagnosis not present

## 2021-04-01 DIAGNOSIS — E785 Hyperlipidemia, unspecified: Secondary | ICD-10-CM | POA: Diagnosis not present

## 2021-04-01 DIAGNOSIS — S42301A Unspecified fracture of shaft of humerus, right arm, initial encounter for closed fracture: Secondary | ICD-10-CM | POA: Diagnosis not present

## 2021-04-11 ENCOUNTER — Other Ambulatory Visit: Payer: Self-pay | Admitting: Hematology

## 2021-04-12 ENCOUNTER — Encounter: Payer: Self-pay | Admitting: Hematology

## 2021-04-21 DIAGNOSIS — N1831 Chronic kidney disease, stage 3a: Secondary | ICD-10-CM | POA: Diagnosis not present

## 2021-04-21 DIAGNOSIS — E1122 Type 2 diabetes mellitus with diabetic chronic kidney disease: Secondary | ICD-10-CM | POA: Diagnosis not present

## 2021-04-21 DIAGNOSIS — I129 Hypertensive chronic kidney disease with stage 1 through stage 4 chronic kidney disease, or unspecified chronic kidney disease: Secondary | ICD-10-CM | POA: Diagnosis not present

## 2021-04-21 DIAGNOSIS — D631 Anemia in chronic kidney disease: Secondary | ICD-10-CM | POA: Diagnosis not present

## 2021-04-27 ENCOUNTER — Telehealth: Payer: Self-pay | Admitting: Internal Medicine

## 2021-04-27 NOTE — Telephone Encounter (Signed)
STAT if patient feels like he/she is going to faint   Are you dizzy now? No, is sitting down   Do you feel faint or have you passed out? Felt she was going to for the past 3 days every time she gets up  Do you have any other symptoms?  Left arm and neck pain as well as nausea   Have you checked your HR and BP (record if available)?  02/07 10:00 PM 177/100 02/06 11:00 AM 176/100    Patient states for the past three days she has been dizzy to the point she has to sit down every time she does something.

## 2021-04-27 NOTE — Telephone Encounter (Signed)
Called patient back about her message. Patient stated when she going from sitting to standing she gets dizzy, lightheaded, nauseated, elevated BP (177/100, 176/100) and stated she can sleep in a heart beat she feels so tired. Patient does not have any HR readings to report. She stated this has been going on for 3 days. Patient stated it resolves when sitting back down and being calm seems to help. Consulted Dr. Gasper Sells, he suggested with the left arm and neck pain and with her history she will need to go ED if symptoms get worse. He suggested if her BP is still elevated she could increase her hydralazine 50 mg BID and get evaluated by DOD tomorrow. Called patient back. Patient stated she is fine as long as she does not move. Patient stated she will take her BP when her husband comes  back home and if it is still high, she will take the extra hydralazine.  Made patient an appointment to see Dr. Marlou Porch tomorrow.

## 2021-04-28 ENCOUNTER — Ambulatory Visit: Payer: Medicare Other | Admitting: Cardiology

## 2021-04-28 ENCOUNTER — Encounter: Payer: Self-pay | Admitting: Cardiology

## 2021-04-28 ENCOUNTER — Other Ambulatory Visit: Payer: Self-pay

## 2021-04-28 VITALS — BP 160/90 | HR 57 | Ht 64.0 in | Wt 164.0 lb

## 2021-04-28 DIAGNOSIS — I25118 Atherosclerotic heart disease of native coronary artery with other forms of angina pectoris: Secondary | ICD-10-CM | POA: Diagnosis not present

## 2021-04-28 DIAGNOSIS — R42 Dizziness and giddiness: Secondary | ICD-10-CM | POA: Insufficient documentation

## 2021-04-28 DIAGNOSIS — I10 Essential (primary) hypertension: Secondary | ICD-10-CM

## 2021-04-28 MED ORDER — AMLODIPINE BESYLATE 5 MG PO TABS
5.0000 mg | ORAL_TABLET | Freq: Every day | ORAL | 3 refills | Status: AC
Start: 2021-04-28 — End: ?

## 2021-04-28 NOTE — Patient Instructions (Signed)
Medication Instructions:  Please start Amlodipine 5 mg once a day. Continue all other medications as listed.  *If you need a refill on your cardiac medications before your next appointment, please call your pharmacy*  Follow-Up: At [facility], you and your health needs are our priority.  As part of our continuing mission to provide you with exceptional heart care, we have created designated Provider Care Teams.  These Care Teams include your primary Cardiologist (physician) and Advanced Practice Providers (APPs -  Physician Assistants and Nurse Practitioners) who all work together to provide you with the care you need, when you need it.  We recommend signing up for the patient portal called "MyChart".  Sign up information is provided on this After Visit Summary.  MyChart is used to connect with patients for Virtual Visits (Telemedicine).  Patients are able to view lab/test results, encounter notes, upcoming appointments, etc.  Non-urgent messages can be sent to your provider as well.   To learn more about what you can do with MyChart, go to [email or web address].    Your next appointment:   FOLLOW UP AS SCHEDULED.  Thank you for choosing [facility]!!

## 2021-04-28 NOTE — Assessment & Plan Note (Signed)
By description, sounds more like disequilibrium related dizziness.  She always tends to list to the right.  Has to lean against the wall sometimes or she feels as though she is going to fall.  She does not "blackout or lose vision or hearing.  Her orthostatics were normal.  She also sometimes has a sensation where she needs to sit down when she gets nauseous.  Perhaps this is a vagal type response.  Nonetheless, she knows to be safe and to sit.  She may benefit at some point from ENT referral.  She will discuss this further with Dr. Philip Aspen.
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
## 2021-04-28 NOTE — Assessment & Plan Note (Signed)
EKG today stable.  No evidence of ischemia.  No chest pain.

## 2021-04-28 NOTE — Assessment & Plan Note (Signed)
We will go ahead and start her on amlodipine 5 mg once a day.  We will take hydralazine off of her list.  She has not been taking.  She used to be on amlodipine for several years and then when she had her septic episode in the hospital this was stopped.  Hopefully this will help with blood pressure.

## 2021-05-02 DIAGNOSIS — I2510 Atherosclerotic heart disease of native coronary artery without angina pectoris: Secondary | ICD-10-CM | POA: Diagnosis not present

## 2021-05-02 DIAGNOSIS — I5041 Acute combined systolic (congestive) and diastolic (congestive) heart failure: Secondary | ICD-10-CM | POA: Diagnosis not present

## 2021-05-02 DIAGNOSIS — S42301A Unspecified fracture of shaft of humerus, right arm, initial encounter for closed fracture: Secondary | ICD-10-CM | POA: Diagnosis not present

## 2021-05-02 DIAGNOSIS — E785 Hyperlipidemia, unspecified: Secondary | ICD-10-CM | POA: Diagnosis not present

## 2021-05-02 DIAGNOSIS — E119 Type 2 diabetes mellitus without complications: Secondary | ICD-10-CM | POA: Diagnosis not present

## 2021-05-04 ENCOUNTER — Telehealth: Payer: Self-pay | Admitting: Hematology
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
## 2021-05-10 ENCOUNTER — Encounter: Payer: Self-pay | Admitting: *Deleted

## 2021-05-13 ENCOUNTER — Ambulatory Visit: Payer: Medicare Other | Admitting: Internal Medicine

## 2021-05-13 ENCOUNTER — Encounter: Payer: Self-pay | Admitting: Internal Medicine

## 2021-05-13 ENCOUNTER — Telehealth: Payer: Self-pay | Admitting: *Deleted

## 2021-05-13 VITALS — BP 124/70 | HR 60 | Ht 64.0 in | Wt 160.0 lb

## 2021-05-13 DIAGNOSIS — D473 Essential (hemorrhagic) thrombocythemia: Secondary | ICD-10-CM | POA: Diagnosis not present

## 2021-05-13 DIAGNOSIS — Z86010 Personal history of colonic polyps: Secondary | ICD-10-CM

## 2021-05-13 DIAGNOSIS — Z7902 Long term (current) use of antithrombotics/antiplatelets: Secondary | ICD-10-CM
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
## 2021-05-13 NOTE — Telephone Encounter (Signed)
° °  Patient Name: Anna [name]  DOB: [birth year] MRN: [id number]  Primary Cardiologist: Candee Furbish, MD  Chart reviewed as part of pre-operative protocol coverage. The patient has an upcoming visit scheduled with Dr. Gasper Sells on 05/31/2021 at which time clearance can be addressed in case there are any issues that would impact surgical recommendations.  Colonoscopy date TBD per surgical clearance request. I added preop FYI to appointment notes that provider is aware to address at time of outpatient visit. Per office protocol the cardiology provider should for their finalize clearance decision and recommendations regarding antiplatelet therapy (Plavix and Pletal) to requesting party below.    I will route this message as FYI to requesting party and remove this message from the preop box as separate preop APP input not needed at this time.  Please call with any questions.   Lenna Sciara, NP 05/13/2021, 9:23 AM

## 2021-05-13 NOTE — Telephone Encounter (Signed)
Advised that per cardiology, colonoscopy would be best to complete in May 2023 after she is able to discontinue Plavix therapy. Also advised that we will be in contact when it gets closer to May to set up an appointment for procedure.

## 2021-05-13 NOTE — Telephone Encounter (Signed)
-----   Message from Jerene Bears, MD sent at 05/13/2021  5:06 PM EST ----- Carla Drape, Please see recommendation from cardiology I appreciate his input We can perform colonoscopy later in May 2023 when she switches away from Plavix to single antiplatelet therapy, I presume this to be aspirin. JMP  ----- Message ----- From: Werner Lean, MD Sent: 05/13/2021   1:25 PM EST To: Jerene Bears, MD  Thanks for reaching out. I think its very reasonable for her to get a colonscopy from a cardiac perspective.  My partner saw her for nausea/vomiting and associated HTN recently, but her LV recovered after PCI.  Ideally, given her inpatient NSTEMI in 2022 she would be done in May after she switches to antiplatelet monotherapy.  If there is a needed for sooner eval than after mt 05/30/21 visit with her let me know and I'm happy to chat.  Thanks, MAC   ----- Message ----- From: Jerene Bears, MD Sent: 05/13/2021   1:09 PM EST To: Werner Lean, MD  Aletta Edouard, I saw Anna [name] today to consider colonoscopy.  She is interested but I wanted your opinion particularly regarding her antiplatelet therapy given her PCI in May 2022. If we were to hold Plavix would we need to do so after May 2022 and do think this is reasonable in her case. Thank you for your time and your opinion Lajuan Lines. Pyrtle, M.D.  05/13/2021

## 2021-05-13 NOTE — Telephone Encounter (Signed)
Request for surgical clearance:     Endoscopy Procedure  What type of surgery is being performed?   colonoscopy  When is this surgery scheduled?     TBD  What type of clearance is required ?   Pharmacy AND Medical  Are there any medications that need to be held prior to surgery and how long? Plavix, Pletal x  5 days  Practice name and name of physician performing surgery?      [facility]  What is your office phone and fax number?      Phone- [phone]  [phone]  Anesthesia type (None, local, MAC, general) ?       MAC

## 2021-05-13 NOTE — Patient Instructions (Signed)
We will be in contact with you regarding colonoscopy after speaking with Dr Irene Limbo and Dr Lurline Del office.  If you are age 73 or older, your body mass index should be between 23-30. Your Body mass index is 27.46 kg/m. If this is out of the aforementioned range listed, please consider follow up with your Primary Care Provider.  If you are age 88 or younger, your body mass index should be between 19-25. Your Body mass index is 27.46 kg/m. If this is out of the aformentioned range listed, please consider follow up with your Primary Care Provider.   ________________________________________________________  The [facility] providers would like to encourage you to use [facility] to communicate with providers for non-urgent requests or questions.  Due to long hold times on the telephone, sending your provider a message by [facility] may be a faster and more efficient way to get a response.  Please allow 48 business hours for a response.  Please remember that this is for non-urgent requests.  _______________________________________________________ Due to recent changes in healthcare laws, you may see the results of your imaging and laboratory studies on MyChart before your provider has had a chance to review them.  We understand that in some cases there may be results that are confusing or concerning to you. Not all laboratory results come back in the same time frame and the provider may be waiting for multiple results in order to interpret others.  Please give [place] 48 hours in order for your provider to thoroughly review all the results before contacting the office for clarification of your results.

## 2021-05-18 ENCOUNTER — Inpatient Hospital Stay: Payer: Medicare Other

## 2021-05-19 ENCOUNTER — Inpatient Hospital Stay: Payer: Medicare Other | Admitting: Hematology

## 2021-05-20 ENCOUNTER — Telehealth: Payer: Medicare Other | Admitting: Hematology

## 2021-05-27 ENCOUNTER — Other Ambulatory Visit: Payer: Medicare Other

## 2021-05-30 DIAGNOSIS — I2510 Atherosclerotic heart disease of native coronary artery without angina pectoris: Secondary | ICD-10-CM | POA: Diagnosis not present

## 2021-05-30 DIAGNOSIS — H524 Presbyopia: Secondary | ICD-10-CM | POA: Diagnosis not present

## 2021-05-30 DIAGNOSIS — S42301A Unspecified fracture of shaft of humerus, right arm, initial encounter for closed fracture: Secondary | ICD-10-CM | POA: Diagnosis not present

## 2021-05-30 DIAGNOSIS — I5041 Acute combined systolic (congestive) and diastolic (congestive) heart failure: Secondary | ICD-10-CM | POA: Diagnosis not present

## 2021-05-30 DIAGNOSIS — E785 Hyperlipidemia, unspecified: Secondary | ICD-10-CM | POA: Diagnosis not present
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
## 2021-05-31 ENCOUNTER — Other Ambulatory Visit: Payer: Self-pay | Admitting: Hematology

## 2021-05-31 ENCOUNTER — Other Ambulatory Visit: Payer: Self-pay

## 2021-05-31 ENCOUNTER — Encounter: Payer: Self-pay | Admitting: Internal Medicine

## 2021-05-31 ENCOUNTER — Ambulatory Visit: Payer: Medicare Other | Admitting: Internal Medicine

## 2021-05-31 VITALS — BP 140/62 | HR 58 | Ht 64.0 in | Wt 161.0 lb

## 2021-05-31 DIAGNOSIS — I25118 Atherosclerotic heart disease of native coronary artery with other forms of angina pectoris: Secondary | ICD-10-CM | POA: Diagnosis not present

## 2021-05-31 DIAGNOSIS — I10 Essential (primary) hypertension: Secondary | ICD-10-CM | POA: Diagnosis not present

## 2021-05-31 DIAGNOSIS — I739 Peripheral vascular disease, unspecified: Secondary | ICD-10-CM | POA: Diagnosis not present

## 2021-05-31 NOTE — Patient Instructions (Addendum)
Medication Instructions:  ?Your physician has recommended you make the following change in your medication:  ?STOP: cilostazol (Pletal)  ? ?STOP:  clopidogrel (Plavix) on Jul 18, 2021 ? ? ?*If you need a refill on your cardiac medications before your next appointment, please call your pharmacy* ? ? ?Lab Work: ?NONE ?If you have labs (blood work) drawn today and your tests are completely normal, you will receive your results only by: ?MyChart Message (if you have MyChart) OR ?A paper copy in the mail ?If you have any lab test that is abnormal or we need to change your treatment, we will call you to review the results. ? ? ?Testing/Procedures: ?NONE ? ? ?Follow-Up: ?At [facility], you and your health needs are our priority.  As part of our continuing mission to provide you with exceptional heart care, we have created designated Provider Care Teams.  These Care Teams include your primary Cardiologist (physician) and Advanced Practice Providers (APPs -  Physician Assistants and Nurse Practitioners) who all work together to provide you with the care you need, when you need it. ? ?We recommend signing up for the patient portal called "MyChart".  Sign up information is provided on this After Visit Summary.  MyChart is used to connect with patients for Virtual Visits (Telemedicine).  Patients are able to view lab/test results, encounter notes, upcoming appointments, etc.  Non-urgent messages can be sent to your provider as well.   ?To learn more about what you can do with MyChart, go to [email or web address].   ? ?Your next appointment:   ?6 month(s) ? ?The format for your next appointment:   ?In Person ? ?Provider:  Rudean Haskell, MD ? ?

## 2021-06-03 ENCOUNTER — Other Ambulatory Visit: Payer: Self-pay

## 2021-06-03 ENCOUNTER — Telehealth: Payer: Medicare Other | Admitting: Hematology

## 2021-06-03 DIAGNOSIS — D471 Chronic myeloproliferative disease: Secondary | ICD-10-CM

## 2021-06-06 ENCOUNTER — Other Ambulatory Visit: Payer: Self-pay

## 2021-06-06 ENCOUNTER — Inpatient Hospital Stay: Payer: Medicare Other | Attending: Hematology

## 2021-06-06 DIAGNOSIS — N189 Chronic kidney disease, unspecified: Secondary | ICD-10-CM | POA: Diagnosis not present

## 2021-06-06 DIAGNOSIS — D509 Iron deficiency anemia, unspecified: Secondary | ICD-10-CM | POA: Insufficient documentation

## 2021-06-06 DIAGNOSIS — D471 Chronic myeloproliferative disease: Secondary | ICD-10-CM

## 2021-06-06 DIAGNOSIS — D473 Essential (hemorrhagic) thrombocythemia: Secondary | ICD-10-CM | POA: Diagnosis present

## 2021-06-06 LAB — VITAMIN B12: Vitamin B-12: 490 pg/mL (ref 180–914)

## 2021-06-06 LAB — CBC WITH DIFFERENTIAL (CANCER CENTER ONLY)
Abs Immature Granulocytes: 0.02 10*3/uL (ref 0.00–0.07)
Basophils Absolute: 0.1 10*3/uL (ref 0.0–0.1)
Basophils Relative: 1 %
Eosinophils Absolute: 0.1 10*3/uL (ref 0.0–0.5)
Eosinophils Relative: 1 %
HCT: 37.8 % (ref 36.0–46.0)
Hemoglobin: 12.8 g/dL (ref 12.0–15.0)
Immature Granulocytes: 0 %
Lymphocytes Relative: 23 %
Lymphs Abs: 1.1 10*3/uL (ref 0.7–4.0)
MCH: 34 pg (ref 26.0–34.0)
MCHC: 33.9 g/dL (ref 30.0–36.0)
MCV: 100.5 fL — ABNORMAL HIGH (ref 80.0–100.0)
Monocytes Absolute: 0.6 10*3/uL (ref 0.1–1.0)
Monocytes Relative: 11 %
Neutro Abs: 3.1 10*3/uL (ref 1.7–7.7)
Neutrophils Relative %: 64 %
Platelet Count: 346 10*3/uL (ref 150–400)
RBC: 3.76 MIL/uL — ABNORMAL LOW (ref 3.87–5.11)
RDW: 13.4 % (ref 11.5–15.5)
WBC Count: 4.9 10*3/uL (ref 4.0–10.5)
nRBC: 0 % (ref 0.0–0.2)

## 2021-06-06 LAB — CMP (CANCER CENTER ONLY)
ALT: 16 U/L (ref 0–44)
AST: 27 U/L (ref 15–41)
Albumin: 4.1 g/dL (ref 3.5–5.0)
Alkaline Phosphatase: 58 U/L (ref 38–126)
Anion gap: 5 (ref 5–15)
BUN: 26 mg/dL — ABNORMAL HIGH (ref 8–23)
CO2: 29 mmol/L (ref 22–32)
Calcium: 10.3 mg/dL (ref 8.9–10.3)
Chloride: 106 mmol/L (ref 98–111)
Creatinine: 1.77 mg/dL — ABNORMAL HIGH (ref 0.44–1.00)
GFR, Estimated: 30 mL/min — ABNORMAL LOW (ref >60)
Glucose, Bld: 162 mg/dL — ABNORMAL HIGH (ref 70–99)
Potassium: 4.4 mmol/L (ref 3.5–5.1)
Sodium: 140 mmol/L (ref 135–145)
Total Bilirubin: 0.5 mg/dL (ref 0.3–1.2)
Total Protein: 7.1 g/dL (ref 6.5–8.1)

## 2021-06-06 LAB — IRON AND IRON BINDING CAPACITY (CC-WL,HP ONLY)
Iron: 150 ug/dL (ref 28–170)
Saturation Ratios: 33 % — ABNORMAL HIGH (ref 10.4–31.8)
TIBC: 454 ug/dL — ABNORMAL HIGH (ref 250–450)
UIBC: 304 ug/dL (ref 148–442)

## 2021-06-06 LAB — FERRITIN: Ferritin: 241 ng/mL (ref 11–307)

## 2021-06-07 ENCOUNTER — Telehealth: Payer: Self-pay | Admitting: *Deleted

## 2021-06-07 ENCOUNTER — Inpatient Hospital Stay (HOSPITAL_BASED_OUTPATIENT_CLINIC_OR_DEPARTMENT_OTHER): Payer: Medicare Other | Admitting: Hematology

## 2021-06-07 DIAGNOSIS — D473 Essential (hemorrhagic) thrombocythemia: Secondary | ICD-10-CM

## 2021-06-07 DIAGNOSIS — D471 Chronic myeloproliferative disease: Secondary | ICD-10-CM | POA: Diagnosis not present

## 2021-06-07 DIAGNOSIS — D509 Iron deficiency anemia, unspecified: Secondary | ICD-10-CM | POA: Diagnosis not present

## 2021-06-07 DIAGNOSIS — N189 Chronic kidney disease, unspecified: Secondary | ICD-10-CM | POA: Diagnosis not present

## 2021-06-07 MED ORDER — HYDROXYUREA 500 MG PO CAPS
ORAL_CAPSULE | ORAL | 1 refills | Status: DC
Start: 2021-06-07 — End: 2021-12-12
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
## 2021-06-07 NOTE — Telephone Encounter (Signed)
Request for surgical clearance:     Endoscopy Procedure ? ?What type of surgery is being performed?     colonoscopy ? ?When is this surgery scheduled?     TBD ? ?What type of clearance is required ?   Pharmacy ? ?Are there any medications that need to be held prior to surgery and how long? Pletal, 5 days  ? ?Practice name and name of physician performing surgery?      [facility] ? ?What is your office phone and fax number?      Phone- [phone]  Fax- [phone] ? ?Anesthesia type (None, local, MAC, general) ?       [facility] ? ?

## 2021-06-07 NOTE — Telephone Encounter (Signed)
Dr. Gasper Sells ?You saw this patient 05/31/21 and cleared for colonoscopy. They are now requesting pletal hold, but do not mention plavix, which she should still be taking.  ? ?OK to hold pletal and plavix after May 2023? ?

## 2021-06-08 ENCOUNTER — Telehealth: Payer: Self-pay | Admitting: Hematology

## 2021-06-08 NOTE — Telephone Encounter (Signed)
Scheduled follow-up appointments per 3/21 los. Patient's husband is aware. Mailed calendar. ?

## 2021-06-10 NOTE — Telephone Encounter (Signed)
I have left a message for patient to call back on both her mobile and home phone numbers. She can now be scheduled for [facility] colonoscopy (June 2023) and is given clearance to hold pletal for the procedure. She was already advised by cardiology previously that she would be discontinuing plavix in May. ?

## 2021-06-13 NOTE — Telephone Encounter (Addendum)
? ?  Patient Name: Anna [name]  ?DOB: [birth year] ?MRN: [id number] ? ?Primary Cardiologist: Werner Lean, MD ? ?Chart reviewed as part of pre-operative protocol coverage. Pre-op clearance already addressed by colleagues in earlier phone notes. To summarize recommendations: ? ?- Dr. Gasper Sells evaluated patient in clinic and felt it was reasonable to proceed with colonoscopy planned in May. ? ?- Regarding Plavix, she was recently advised to stop this on Jul 18, 2021.  ? ?- Regarding Pletal, this was also stopped at recent visit as well as patient believed it was making her leg pain worse. ? ?Will route this bundled recommendation to requesting provider via Epic fax function and remove from pre-op pool. Please call with questions. ? ?Charlie Pitter, PA-C ?06/13/2021, 8:26 AM ? ? ? ?

## 2021-06-14 ENCOUNTER — Encounter: Payer: Self-pay | Admitting: Hematology

## 2021-06-17 NOTE — Telephone Encounter (Signed)
I have left a message for patient to call back to schedule procedure at this time. Per cardiology, she may proceed with colonoscopy in May. She will not be on any anticoagulants at that time. ?

## 2021-06-21 ENCOUNTER — Telehealth: Payer: Self-pay | Admitting: Internal Medicine

## 2021-06-21 MED ORDER — PLENVU 140 G PO SOLR: 1.0000 | ORAL | 0 refills | Status: DC

## 2021-06-21 NOTE — Telephone Encounter (Signed)
I have spoken to patient's husband (on ROI and patient caregiver). I have advised that cardiology has okayed patient to have procedure late May 2023 once she is off her plavix. She has been scheduled for 08/11/21 at 130 pm in [place]. Husband verbalizes understanding and new instructions will be mailed to patient home address.  ?

## 2021-06-21 NOTE — Telephone Encounter (Signed)
I have left a message for patient to call back. 

## 2021-06-21 NOTE — Telephone Encounter (Signed)
See 05/13/21 telephone note ?

## 2021-06-21 NOTE — Telephone Encounter (Signed)
Inbound call from patient husband would like to discuss possible scheduling colonoscopy and information that needs discussed from cardiology ?

## 2021-06-21 NOTE — Addendum Note (Signed)
Addended by: Larina Bras on: 06/21/2021 03:05 PM ? ? Modules accepted: Orders ? ?

## 2021-06-30 DIAGNOSIS — S42301A Unspecified fracture of shaft of humerus, right arm, initial encounter for closed fracture: Secondary | ICD-10-CM | POA: Diagnosis not present

## 2021-06-30 DIAGNOSIS — E785 Hyperlipidemia, unspecified: Secondary | ICD-10-CM | POA: Diagnosis not present

## 2021-06-30 DIAGNOSIS — I5041 Acute combined systolic (congestive) and diastolic (congestive) heart failure: Secondary | ICD-10-CM | POA: Diagnosis not present

## 2021-06-30 DIAGNOSIS — I2510 Atherosclerotic heart disease of native coronary artery without angina pectoris: Secondary | ICD-10-CM | POA: Diagnosis not present

## 2021-07-08 ENCOUNTER — Ambulatory Visit: Payer: Medicare Other | Admitting: Cardiovascular Disease

## 2021-07-13 ENCOUNTER — Telehealth: Payer: Self-pay | Admitting: Internal Medicine

## 2021-07-13 NOTE — Telephone Encounter (Signed)
Inbound call from patient husband. Reports went to pick up prep plenvu from pharmacy and they do not have it. Request it be resent to [facility] on [address] ?

## 2021-07-14 ENCOUNTER — Other Ambulatory Visit: Payer: Self-pay

## 2021-07-14 MED ORDER — PLENVU 140 G PO SOLR: 1.0000 | ORAL | 0 refills | Status: DC

## 2021-07-14 NOTE — Telephone Encounter (Signed)
Rx has been resent to pharmacy on file. ?

## 2021-07-18 ENCOUNTER — Telehealth: Payer: Self-pay | Admitting: Internal Medicine

## 2021-07-18 NOTE — Telephone Encounter (Addendum)
Patient called stating that her husband went to pick up her prep medication and pharmacy stated they did not have prescription. Patient stated that she would rather do a pill instead of having to mix her prep. Patient is requesting it be resent and a call back to discuss of she is able to get a new prescription for the pill. Please advise.   ?

## 2021-07-19 ENCOUNTER — Other Ambulatory Visit: Payer: Self-pay

## 2021-07-19 MED ORDER — PLENVU 140 G PO SOLR: 1.0000 | ORAL | 0 refills | Status: DC

## 2021-07-19 NOTE — Telephone Encounter (Signed)
Spoke to [place] the patients husband. He states that the Rx never went to the pharmacy. I have re-sent the prep as requested. He states that hes not sure what his wife was referring to for changing the prep. Will leave as Plenvu for now. ?

## 2021-07-20 ENCOUNTER — Telehealth: Payer: Self-pay | Admitting: Internal Medicine

## 2021-07-20 NOTE — Telephone Encounter (Signed)
Patient spouse called to request an alternative plenvu medication be sent to pharmacy. Per spouse, the coupon given doesn't work for [employer] patients. Please advise. ?

## 2021-07-21 DIAGNOSIS — I1310 Hypertensive heart and chronic kidney disease without heart failure, with stage 1 through stage 4 chronic kidney disease, or unspecified chronic kidney disease: Secondary | ICD-10-CM | POA: Diagnosis not present

## 2021-07-21 DIAGNOSIS — E1151 Type 2 diabetes mellitus with diabetic peripheral angiopathy without gangrene: Secondary | ICD-10-CM | POA: Diagnosis not present

## 2021-07-21 DIAGNOSIS — E785 Hyperlipidemia, unspecified: Secondary | ICD-10-CM | POA: Diagnosis not present

## 2021-07-21 DIAGNOSIS — G4700 Insomnia, unspecified: Secondary | ICD-10-CM | POA: Diagnosis not present

## 2021-07-21 NOTE — Telephone Encounter (Signed)
Left message to return call. Offered a free prep to pick up here at the office. ?

## 2021-07-25 ENCOUNTER — Telehealth: Payer: Self-pay | Admitting: Orthopedic Surgery

## 2021-07-25 NOTE — Telephone Encounter (Signed)
Dr. Sharol Given does not do back injections. Can you please call pt. We are happy to see her at the next available time but he does not do back injections.  ?

## 2021-07-25 NOTE — Telephone Encounter (Signed)
Patient called advised she is leaving town on Sunday and would like an injection in her back. Patient asked if she can be worked into Dr. Jess Barters schedule?  Patient's said her husband will answer the phone. The number to contact patient is [phone] ?

## 2021-07-26 NOTE — Telephone Encounter (Signed)
This pt has not been in the office since 10/2020 can you please call and make an appt. Can not make referral without assessment.  ?

## 2021-07-27 DIAGNOSIS — D485 Neoplasm of uncertain behavior of skin: Secondary | ICD-10-CM | POA: Diagnosis not present

## 2021-07-27 DIAGNOSIS — L821 Other seborrheic keratosis: Secondary | ICD-10-CM | POA: Diagnosis not present

## 2021-07-27 DIAGNOSIS — D045 Carcinoma in situ of skin of trunk: Secondary | ICD-10-CM | POA: Diagnosis not present

## 2021-07-27 DIAGNOSIS — L814 Other melanin hyperpigmentation: Secondary | ICD-10-CM | POA: Diagnosis not present

## 2021-07-27 DIAGNOSIS — Z85828 Personal history of other malignant neoplasm of skin: Secondary | ICD-10-CM | POA: Diagnosis not present

## 2021-07-30 DIAGNOSIS — I5041 Acute combined systolic (congestive) and diastolic (congestive) heart failure: Secondary | ICD-10-CM | POA: Diagnosis not present

## 2021-07-30 DIAGNOSIS — I2510 Atherosclerotic heart disease of native coronary artery without angina pectoris: Secondary | ICD-10-CM | POA: Diagnosis not present

## 2021-07-30 DIAGNOSIS — E785 Hyperlipidemia, unspecified: Secondary | ICD-10-CM | POA: Diagnosis not present

## 2021-07-30 DIAGNOSIS — S42301A Unspecified fracture of shaft of humerus, right arm, initial encounter for closed fracture: Secondary | ICD-10-CM | POA: Diagnosis not present

## 2021-08-03 ENCOUNTER — Encounter: Payer: Self-pay | Admitting: Hematology

## 2021-08-04 ENCOUNTER — Encounter: Payer: Self-pay | Admitting: Internal Medicine

## 2021-08-08 ENCOUNTER — Ambulatory Visit: Payer: Medicare Other | Admitting: Orthopedic Surgery

## 2021-08-09 ENCOUNTER — Ambulatory Visit: Payer: Medicare Other | Admitting: Orthopedic Surgery

## 2021-08-11 ENCOUNTER — Ambulatory Visit: Payer: Medicare Other | Admitting: Orthopedic Surgery

## 2021-08-11 ENCOUNTER — Ambulatory Visit (AMBULATORY_SURGERY_CENTER): Payer: Medicare Other | Admitting: Internal Medicine

## 2021-08-11 ENCOUNTER — Encounter: Payer: Self-pay | Admitting: Internal Medicine

## 2021-08-11 VITALS — BP 175/57 | HR 70 | Temp 97.8°F | Resp 12 | Ht 64.0 in | Wt 160.0 lb

## 2021-08-11 DIAGNOSIS — D120 Benign neoplasm of cecum: Secondary | ICD-10-CM | POA: Diagnosis not present

## 2021-08-11 DIAGNOSIS — D122 Benign neoplasm of ascending colon: Secondary | ICD-10-CM | POA: Diagnosis not present

## 2021-08-11 DIAGNOSIS — Z86010 Personal history of colonic polyps: Secondary | ICD-10-CM

## 2021-08-11 DIAGNOSIS — D125 Benign neoplasm of sigmoid colon: Secondary | ICD-10-CM | POA: Diagnosis not present

## 2021-08-11 MED ORDER — SODIUM CHLORIDE 0.9 % IV SOLN: 500.0000 mL | Freq: Once | INTRAVENOUS | Status: DC

## 2021-08-11 NOTE — Progress Notes (Signed)
PT taken to PACU. Monitors in place. VSS. Report given to RN. 
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
## 2021-08-11 NOTE — Progress Notes (Signed)
Vitals-CW  Pt's states no medical or surgical changes since previsit or office visit. 

## 2021-08-11 NOTE — Patient Instructions (Signed)
Handouts given for polyps and diverticulosis.  Await pathology results.   Resume previous diet and continue present medications.   YOU HAD AN ENDOSCOPIC PROCEDURE TODAY AT [facility]:   Refer to the procedure report that was given to you for any specific questions about what was found during the examination.  If the procedure report does not answer your questions, please call your gastroenterologist to clarify.  If you requested that your care partner not be given the details of your procedure findings, then the procedure report has been included in a sealed envelope for you to review at your convenience later.  YOU SHOULD EXPECT: Some feelings of bloating in the abdomen. Passage of more gas than usual.  Walking can help get rid of the air that was put into your GI tract during the procedure and reduce the bloating. If you had a lower endoscopy (such as a colonoscopy or flexible sigmoidoscopy) you may notice spotting of blood in your stool or on the toilet paper. If you underwent a bowel prep for your procedure, you may not have a normal bowel movement for a few days.  Please Note:  You might notice some irritation and congestion in your nose or some drainage.  This is from the oxygen used during your procedure.  There is no need for concern and it should clear up in a day or so.  SYMPTOMS TO REPORT IMMEDIATELY:  Following lower endoscopy (colonoscopy or flexible sigmoidoscopy):  Excessive amounts of blood in the stool  Significant tenderness or worsening of abdominal pains  Swelling of the abdomen that is new, acute  Fever of 100F or higher  For urgent or emergent issues, a gastroenterologist can be reached at any hour by calling [phone]. Do not use MyChart messaging for urgent concerns.    DIET:  We do recommend a small meal at first, but then you may proceed to your regular diet.  Drink plenty of fluids but you should avoid alcoholic beverages for 24  hours.  ACTIVITY:  You should plan to take it easy for the rest of today and you should NOT DRIVE or use heavy machinery until tomorrow (because of the sedation medicines used during the test).    FOLLOW UP: Our staff will call the number listed on your records 48-72 hours following your procedure to check on you and address any questions or concerns that you may have regarding the information given to you following your procedure. If we do not reach you, we will leave a message.  We will attempt to reach you two times.  During this call, we will ask if you have developed any symptoms of COVID 19. If you develop any symptoms (ie: fever, flu-like symptoms, shortness of breath, cough etc.) before then, please call [phone].  If you test positive for Covid 19 in the 2 weeks post procedure, please call and report this information to [place].    If any biopsies were taken you will be contacted by phone or by letter within the next 1-3 weeks.  Please call us at [phone] if you have not heard about the biopsies in 3 weeks.    SIGNATURES/CONFIDENTIALITY: You and/or your care partner have signed paperwork which will be entered into your electronic medical record.  These signatures attest to the fact that that the information above on your After Visit Summary has been reviewed and is understood.  Full responsibility of the confidentiality of this discharge information lies with you and/or your  care-partner.  

## 2021-08-11 NOTE — Op Note (Signed)
[facility] Patient Name: Anna [name] Procedure Date: 08/11/2021 1:41 PM MRN: [id number] Endoscopist: Jerene Bears , MD Age: 71 Referring MD:  Date of Birth: [birth year] Gender: Female Account #: [id number] Procedure:                Colonoscopy Indications:              High risk colon cancer surveillance: Personal                            history of non-advanced adenoma (2006), Last                            colonoscopy: 2012 Medicines:                Monitored Anesthesia Care Procedure:                Pre-Anesthesia Assessment:                           - Prior to the procedure, a History and Physical                            was performed, and patient medications and                            allergies were reviewed. The patient's tolerance of                            previous anesthesia was also reviewed. The risks                            and benefits of the procedure and the sedation                            options and risks were discussed with the patient.                            All questions were answered, and informed consent                            was obtained. Prior Anticoagulants: The patient has                            taken no previous anticoagulant or antiplatelet                            agents. ASA Grade Assessment: III - A patient with                            severe systemic disease. After reviewing the risks                            and benefits, the patient was deemed in  satisfactory condition to undergo the procedure.                           After obtaining informed consent, the colonoscope                            was passed under direct vision. Throughout the                            procedure, the patient's blood pressure, pulse, and                            oxygen saturations were monitored continuously. The                            Olympus #[id number] was introduced through the anus                             and advanced to the cecum, identified by                            appendiceal orifice and ileocecal valve. The                            colonoscopy was performed without difficulty. The                            patient tolerated the procedure well. The quality                            of the bowel preparation was good. The ileocecal                            valve, appendiceal orifice, and rectum were                            photographed. Scope In: 1:47:05 PM Scope Out: 2:02:21 PM Scope Withdrawal Time: 0 hours 12 minutes 4 seconds  Total Procedure Duration: 0 hours 15 minutes 16 seconds  Findings:                 The digital rectal exam was normal.                           A 5 mm polyp was found in the cecum. The polyp was                            sessile. The polyp was removed with a cold snare.                            Resection and retrieval were complete.                           Two sessile polyps were found in the ascending  colon. The polyps were 4 to 5 mm in size. These                            polyps were removed with a cold snare. Resection                            and retrieval were complete.                           A 4 mm polyp was found in the sigmoid colon. The                            polyp was sessile. The polyp was removed with a                            cold snare. Resection and retrieval were complete.                           Multiple small-mouthed diverticula were found in                            the sigmoid colon.                           Internal hemorrhoids were found during                            retroflexion. The hemorrhoids were medium-sized. Complications:            No immediate complications. Estimated Blood Loss:     Estimated blood loss was minimal. Impression:               - One 5 mm polyp in the cecum, removed with a cold                            snare. Resected  and retrieved.                           - Two 4 to 5 mm polyps in the ascending colon,                            removed with a cold snare. Resected and retrieved.                           - One 4 mm polyp in the sigmoid colon, removed with                            a cold snare. Resected and retrieved.                           - Diverticulosis in the sigmoid colon.                           - Internal hemorrhoids. Recommendation:           -  Patient has a contact number available for                            emergencies. The signs and symptoms of potential                            delayed complications were discussed with the                            patient. Return to normal activities tomorrow.                            Written discharge instructions were provided to the                            patient.                           - Resume previous diet.                           - Continue present medications.                           - Await pathology results.                           - No recommendation at this time regarding repeat                            colonoscopy due to age. Jerene Bears, MD 08/11/2021 2:06:48 PM This report has been signed electronically.

## 2021-08-11 NOTE — Progress Notes (Signed)
Called to room to assist during endoscopic procedure.  Patient ID and intended procedure confirmed with present staff. Received instructions for my participation in the procedure from the performing physician.  

## 2021-08-16 ENCOUNTER — Ambulatory Visit: Payer: Medicare Other | Admitting: Orthopedic Surgery

## 2021-08-16 ENCOUNTER — Ambulatory Visit (INDEPENDENT_AMBULATORY_CARE_PROVIDER_SITE_OTHER): Payer: Medicare Other

## 2021-08-16 DIAGNOSIS — G8929 Other chronic pain: Secondary | ICD-10-CM | POA: Diagnosis not present

## 2021-08-16 DIAGNOSIS — M5441 Lumbago with sciatica, right side: Secondary | ICD-10-CM

## 2021-08-16 MED ORDER — PREDNISONE 10 MG PO TABS: 10.0000 mg | ORAL_TABLET | Freq: Every day | ORAL | 0 refills | Status: DC

## 2021-08-17 ENCOUNTER — Encounter: Payer: Self-pay | Admitting: Orthopedic Surgery
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
## 2021-08-18 ENCOUNTER — Other Ambulatory Visit: Payer: Self-pay | Admitting: Internal Medicine

## 2021-08-18 DIAGNOSIS — N1831 Chronic kidney disease, stage 3a: Secondary | ICD-10-CM

## 2021-08-19 ENCOUNTER — Encounter: Payer: Self-pay | Admitting: Internal Medicine

## 2021-08-25 ENCOUNTER — Telehealth: Payer: Self-pay | Admitting: Internal Medicine

## 2021-08-25 NOTE — Telephone Encounter (Signed)
Patient's husband states they have been in touch with orthopaedic surgery and neurology for her back problem. He states he would like a call back to see if she would need to be reevaluated for a possible surgery. He states they are not requesting any clearance, he is just asking himself.

## 2021-08-26 NOTE — Telephone Encounter (Signed)
Called pt spouse ok per DPR. Spouse reports pt may potentially have back surgery for stenosis.  Wants to know if MD will clear for surgery.  Advised that surgeons office will have to send over a cardiac clearance form.  Our providers will review and determine plan of care and make determination.  Spouse would like to avoid excess OV.  Advised to have surgeon send over form and we will go from there.

## 2021-09-02 ENCOUNTER — Ambulatory Visit
Admission: RE | Admit: 2021-09-02 | Discharge: 2021-09-02 | Disposition: A | Discharge status: Home / Self Care | Payer: Medicare Other | Source: Ambulatory Visit | Attending: Internal Medicine | Admitting: Internal Medicine

## 2021-09-02 ENCOUNTER — Encounter: Payer: Medicare Other | Admitting: Gastroenterology

## 2021-09-02 DIAGNOSIS — N189 Chronic kidney disease, unspecified: Secondary | ICD-10-CM | POA: Diagnosis not present

## 2021-09-02 DIAGNOSIS — N1831 Chronic kidney disease, stage 3a: Secondary | ICD-10-CM

## 2021-09-04 ENCOUNTER — Other Ambulatory Visit: Payer: Self-pay | Admitting: Family

## 2021-09-04 DIAGNOSIS — I25118 Atherosclerotic heart disease of native coronary artery with other forms of angina pectoris: Secondary | ICD-10-CM

## 2021-09-05 ENCOUNTER — Other Ambulatory Visit: Payer: Self-pay

## 2021-09-05 DIAGNOSIS — D473 Essential (hemorrhagic) thrombocythemia: Secondary | ICD-10-CM

## 2021-09-05 NOTE — Telephone Encounter (Signed)
Pt of Dr. Gasper Sells. Please review for refills. Thank you!

## 2021-09-06 ENCOUNTER — Telehealth: Payer: Self-pay | Admitting: Internal Medicine

## 2021-09-06 ENCOUNTER — Inpatient Hospital Stay: Payer: Medicare Other

## 2021-09-06 NOTE — Telephone Encounter (Signed)
Per 6/20 phones line pt called and stated she was not feeling well and wanted to cancel appointments  said she would call back when feeling better to reschedule

## 2021-09-07 ENCOUNTER — Inpatient Hospital Stay: Payer: Medicare Other | Admitting: Hematology

## 2021-09-15 ENCOUNTER — Other Ambulatory Visit: Payer: Self-pay

## 2021-09-15 MED ORDER — METOPROLOL SUCCINATE ER 50 MG PO TB24: 50.0000 mg | ORAL_TABLET | Freq: Every day | ORAL | 3 refills | Status: DC

## 2021-09-17 IMAGING — US US RENAL
1 series · 14 of 25 positions shown · non-contrast
Comparison: None

CLINICAL DATA: History of sepsis, elevated creatinine/acute renal
failure.

EXAM:
RENAL / URINARY TRACT ULTRASOUND COMPLETE

[Series 1: us renal · 14 of 35 slices shown]
[im 1/35]
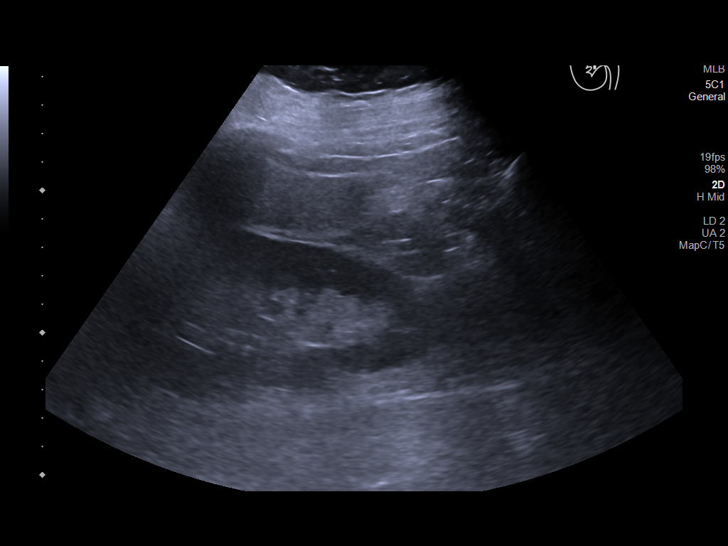
[im 3/35]
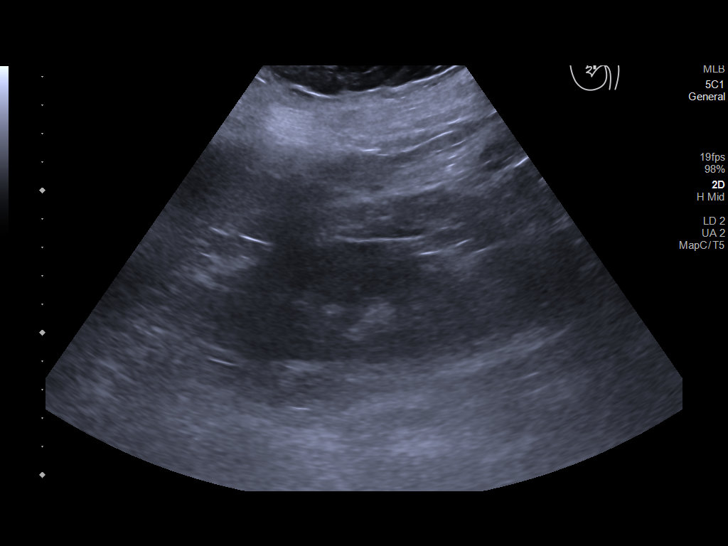
[im 6/35]
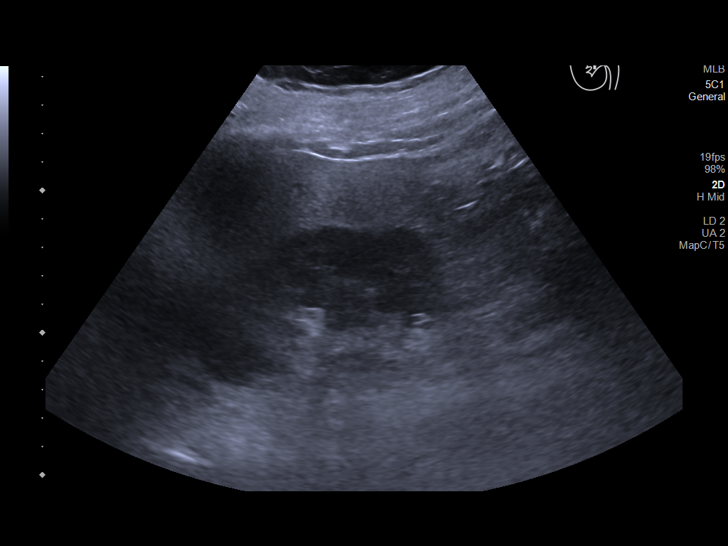
[im 9/35]
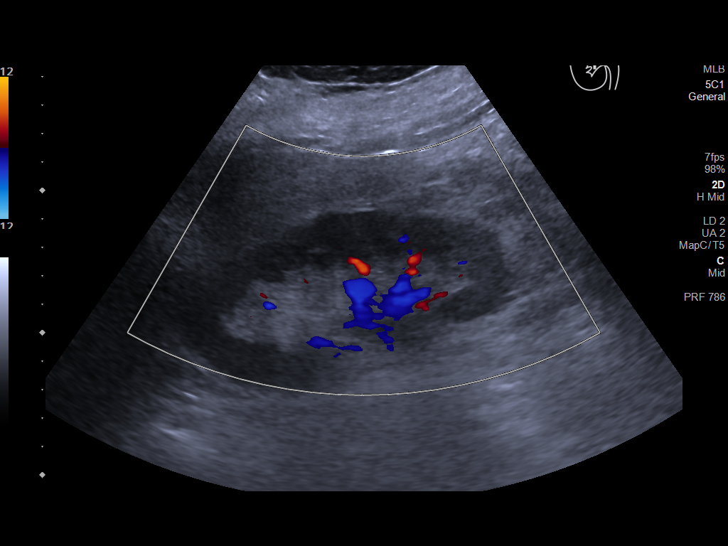
[im 12/35]
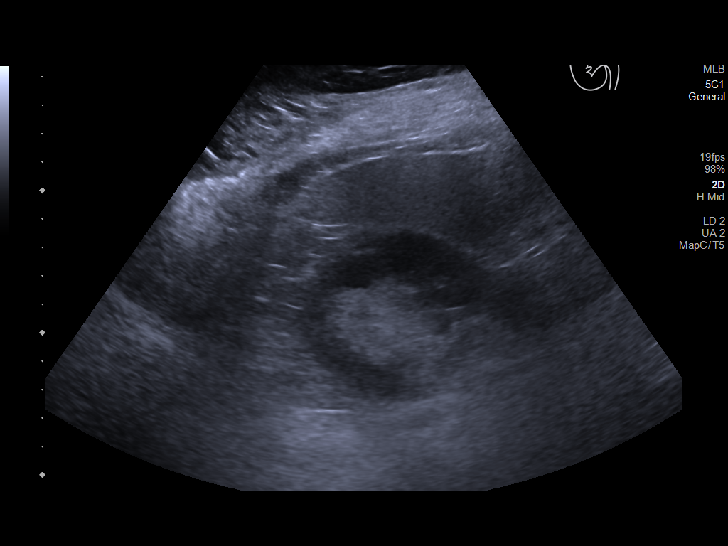
[im 13/35]
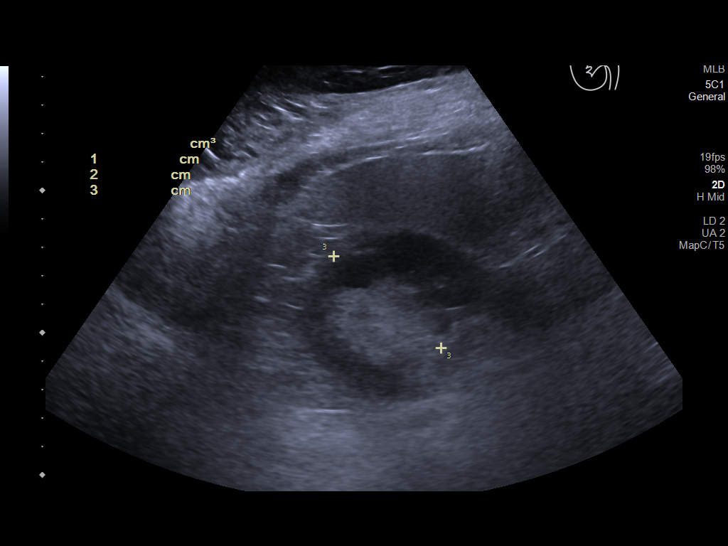
[im 16/35]
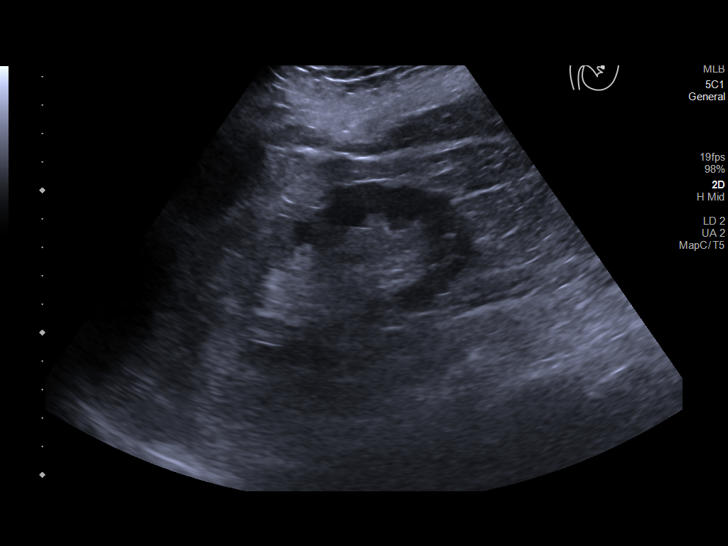
[im 19/35]
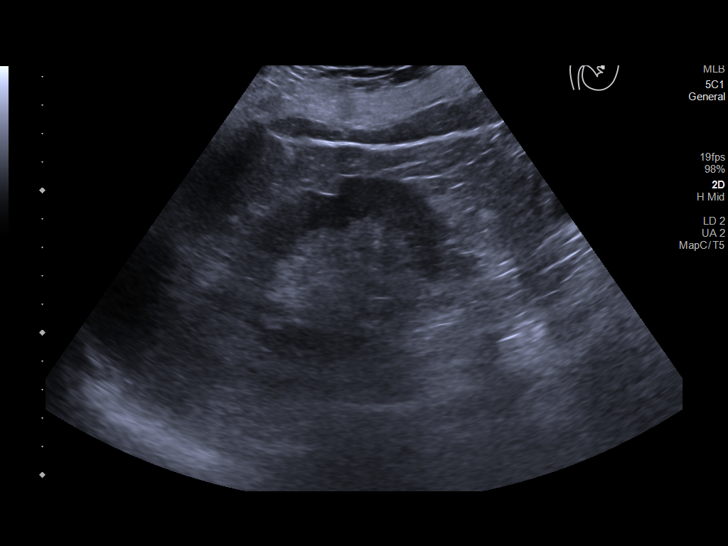
[im 22/35]
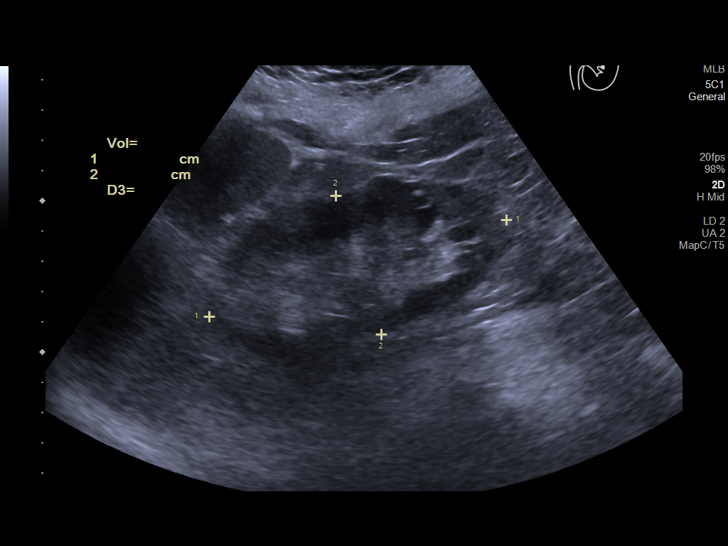
[im 23/35]
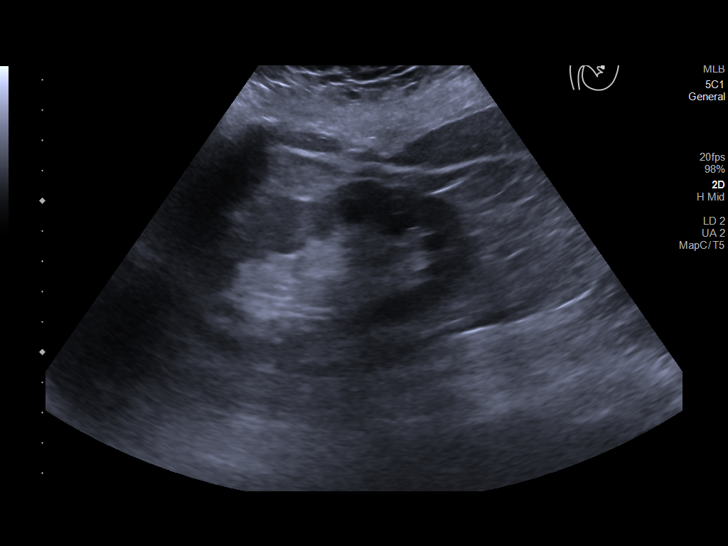
[im 26/35]
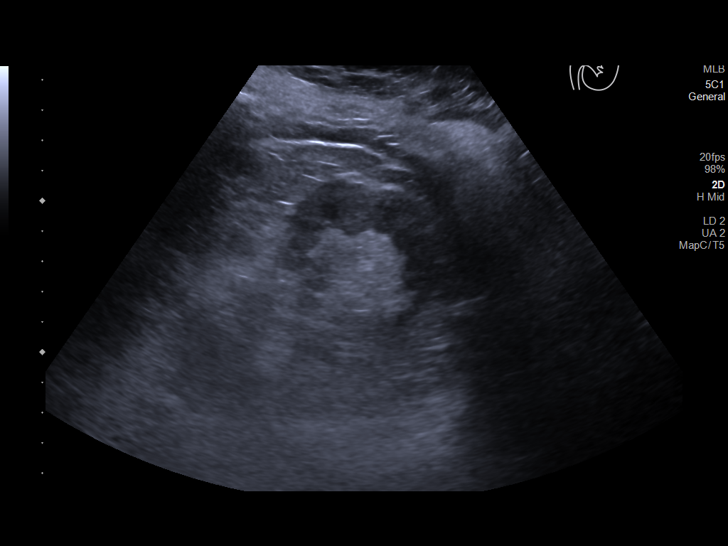
[im 29/35]
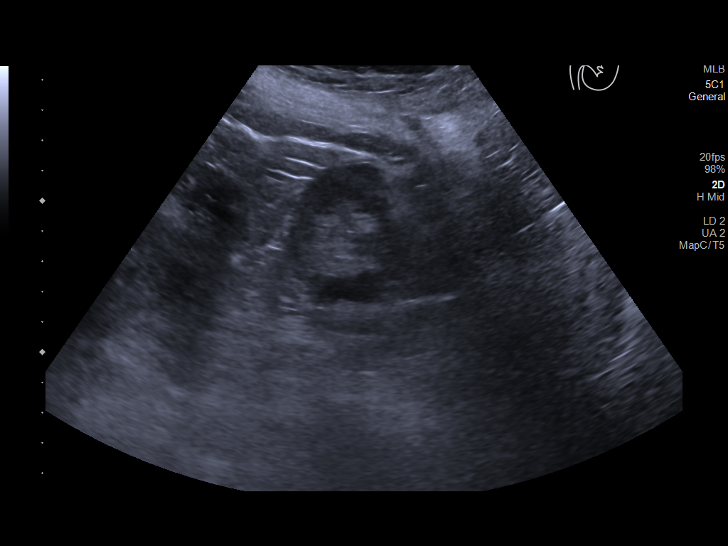
[im 32/35]
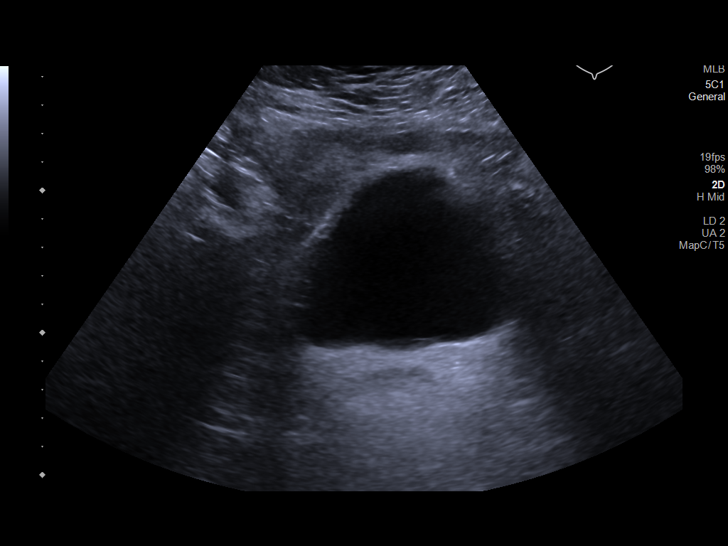
[im 35/35]
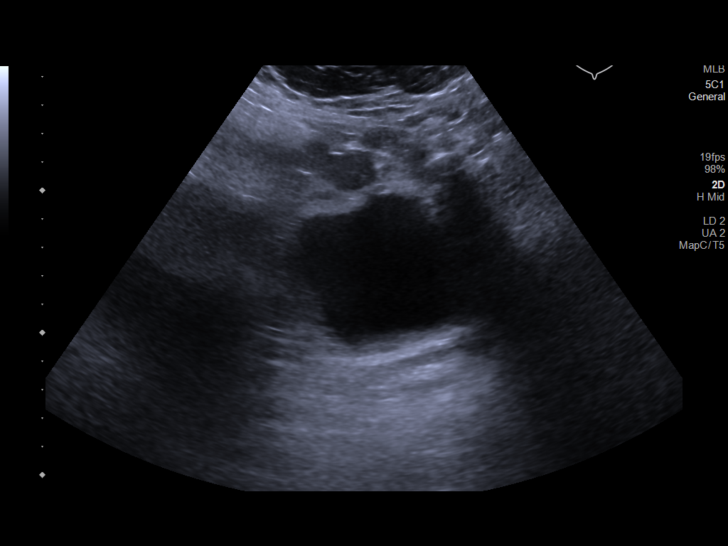

[14 of 25 positions shown; findings below may reference images not displayed]

FINDINGS: Right Kidney:

Renal measurements: 12.5 x 5.5 x 5.0 (volume = 180) cm = volume: 180
mL. Mild cortical scarring, no hydronephrosis. Mild parenchymal
thinning.

Left Kidney:

Renal measurements: 10.3 x 4.8 x 4.5 (volume = 120) cm = volume: 120
mL. Decreased corticomedullary differentiation, cortical thinning
and cortical scarring similar to the contralateral kidney, no signs
of hydronephrosis. No visible lesion or calculus.

Bladder:

Appears normal for degree of bladder distention.

Other:

Echogenic hepatic parenchyma suggests steatosis.
IMPRESSION: 1. Renal cortical thinning and parenchymal scarring.
2. Mild hepatic steatosis.

## 2021-09-17 IMAGING — MR MR LUMBAR SPINE W/O CM
4 of 5 series · 18 of 48 positions shown · non-contrast
Comparison: Lumbar radiographs dated 01/09/2019

CLINICAL DATA: Radiculopathy. Low back pain. The patient fell
yesterday. Fever. Elevated white blood count. Prior lumbar surgery.
And lumbar MRI dated 07/05/2018

EXAM:
MRI LUMBAR SPINE WITHOUT CONTRAST
TECHNIQUE: Multiplanar, multisequence MR imaging of the lumbar spine was
performed. No intravenous contrast was administered.

[Series 3: T2 · sagittal · 4.0mm · 0.55mm/px · 6 of 16 slices shown (1 of 2)]
[im 1/16]
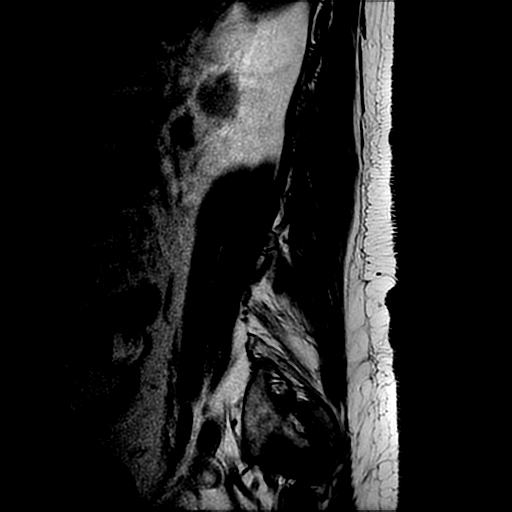
[im 4/16]
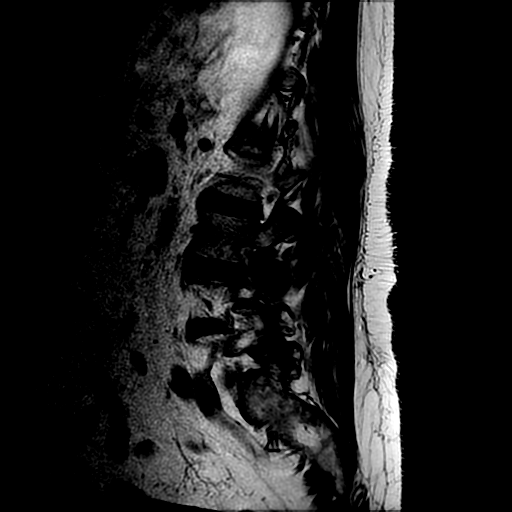
[im 7/16]
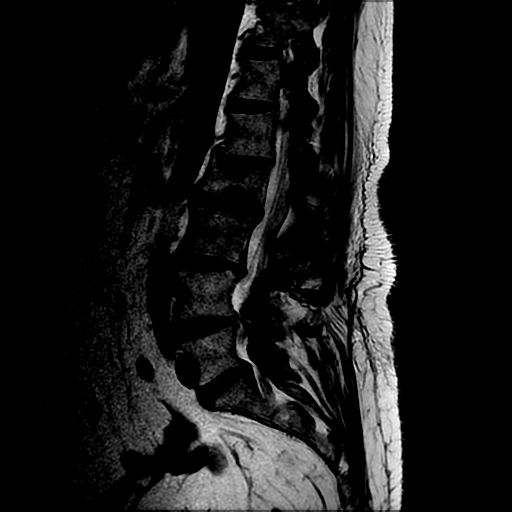
[im 10/16]
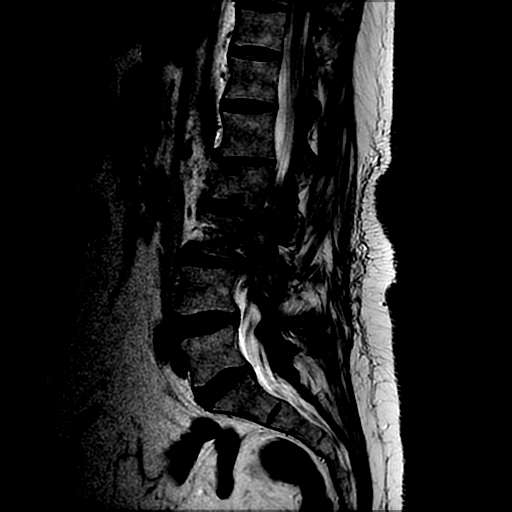
[im 13/16]
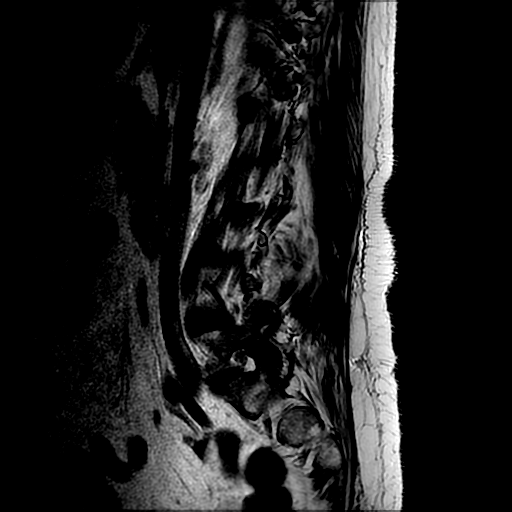
[im 16/16]
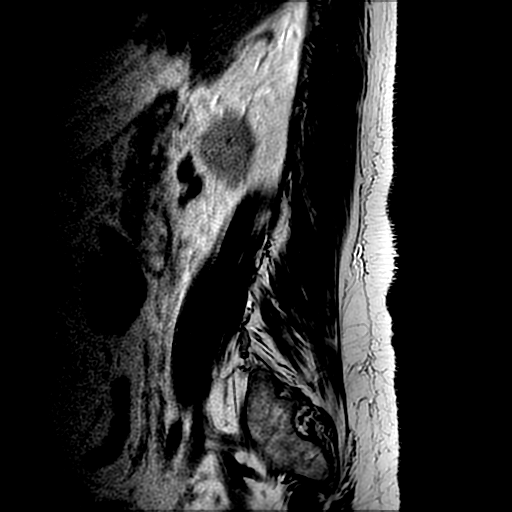

[Series 4: T1 · sagittal · 4.0mm · 0.55mm/px · 3 of 16 slices shown (1 of 2)]
[im 3/16]
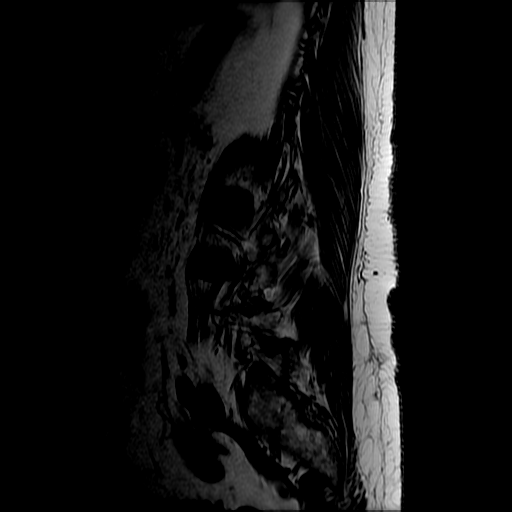
[im 8/16]
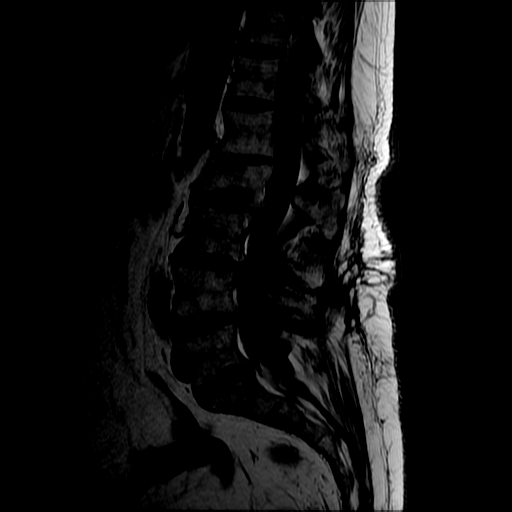
[im 13/16]
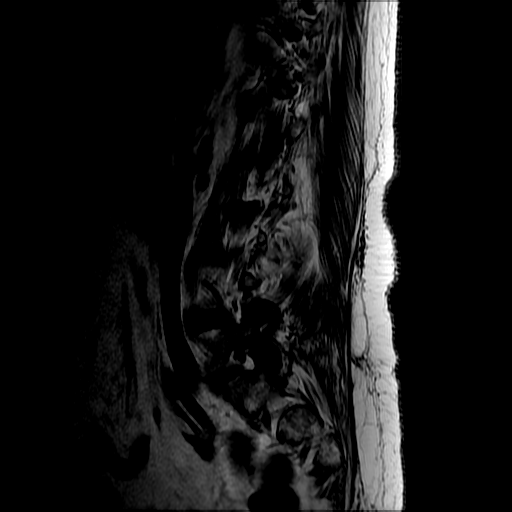

[Series 6: T2 · axial · 4.0mm · 0.39mm/px · z∈[-48,+106]mm · 6 of 33 slices shown (2 of 2)]
[im 1/33]
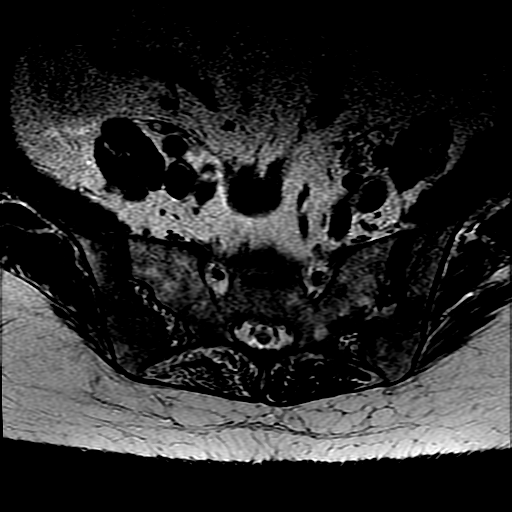
[im 5/33]
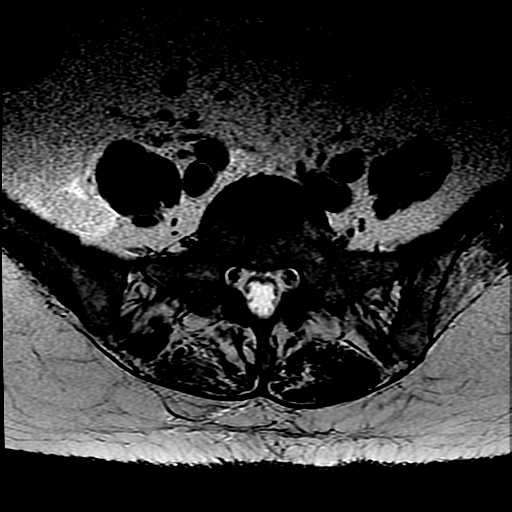
[im 10/33]
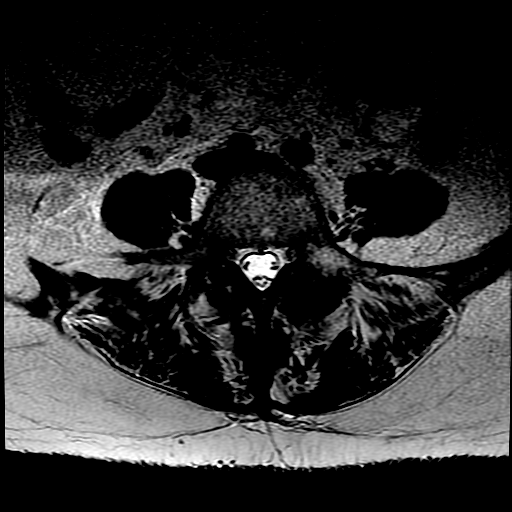
[im 15/33]
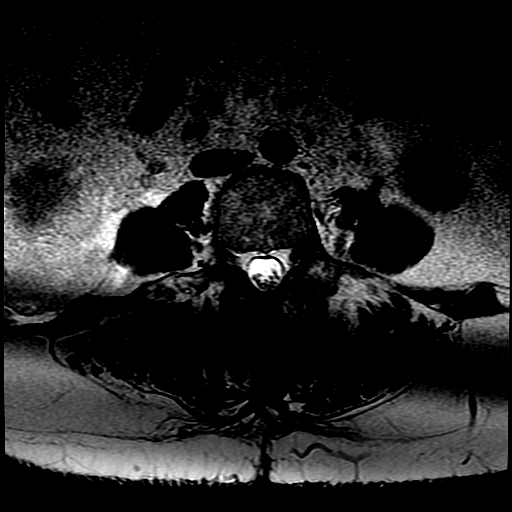
[im 18/33]
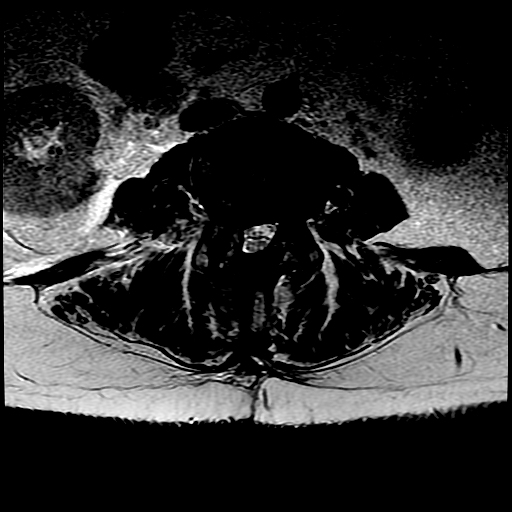
[im 28/33]
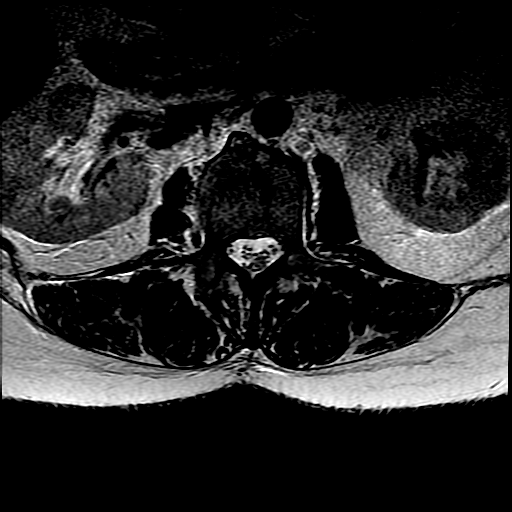

[Series 7: T1 · axial · 4.0mm · 0.39mm/px · z∈[-29,+106]mm · 3 of 33 slices shown (2 of 2)]
[im 5/33]
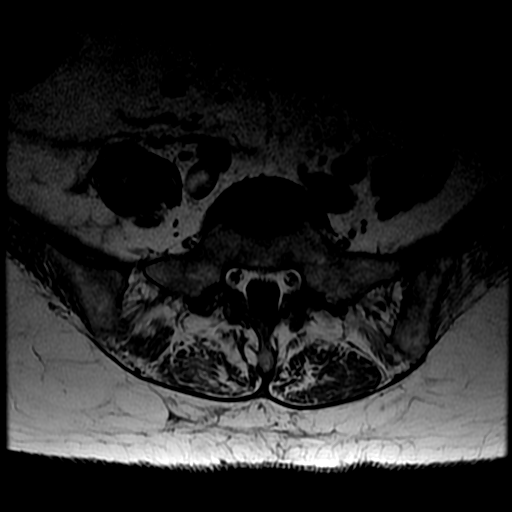
[im 18/33]
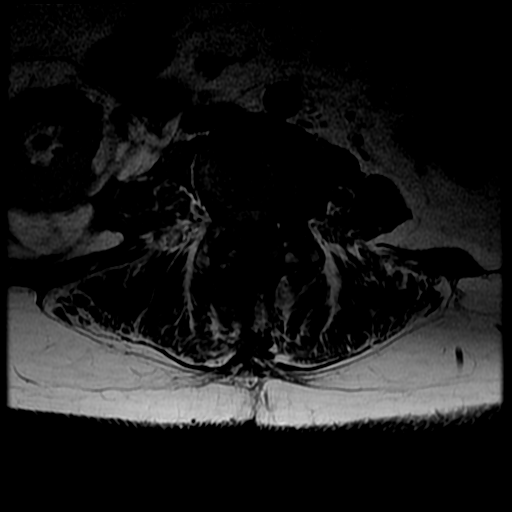
[im 28/33]
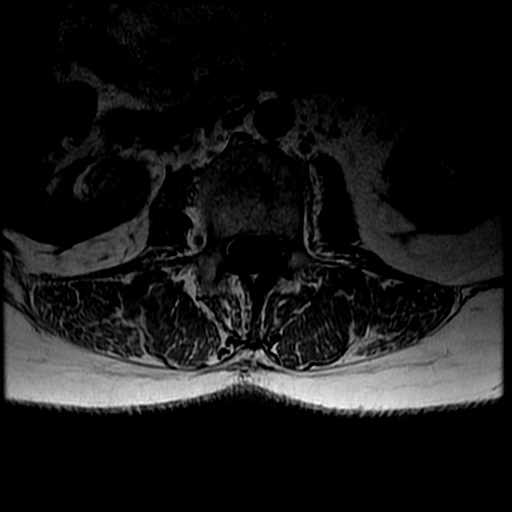

[18 of 48 positions shown; findings below may reference images not displayed]

FINDINGS: Segmentation:  Standard.

Alignment: Slight chronic retrolisthesis of L2 on L3 and of L3 on
L4, stable.

Vertebrae: No acute abnormalities. Old healed compression deformity
of L2 with Schmorl's nodes in the inferior and superior endplates.

Conus medullaris and cauda equina: Conus extends to the L1 level.
Conus and cauda equina appear normal.

Paraspinal and other soft tissues: There is new slight soft tissue
stranding around the right kidney with slight prominence of the
right renal pelvis. The ureter does not appear dilated. There is
also a new small reactive node under right renal artery. The
possibility of right urinary tract infection should be considered.

Disc levels:

L1-2: Disc desiccation. No disc bulging or protrusion. No change
since the prior study.

L2-3: Small disc bulge with accompanying osteophytes extending into
the right neural foramen with moderate right foraminal stenosis.
Hypertrophy of the right facet joint and ligamentum flavum impinges
upon the right lateral recess, essentially unchanged.

L3-4: Chronic retrolisthesis and disc space narrowing with a small
broad-based protrusion of the disc asymmetric into the right neural
foramen and right lateral recess. Interval right laminotomy with
decompression of the thecal sac. Chronic facet arthritis. Moderately
severe right foraminal stenosis. The right L3 nerve appears to exit
without impingement.

L4-5: No significant disc bulging or protrusion. Severe left and
moderate right facet arthritis. Previous right laminectomy with
decompression of the thecal sac. No focal neural impingement.

L5-S1: Severe bilateral facet arthritis, unchanged. No disc bulging
or protrusion.
IMPRESSION: 1. New soft tissue stranding around the right kidney with slight
prominence of the right renal pelvis. The possibility of right
urinary tract infection should be considered.
2. Interval right laminotomy at L3-4 with decompression of the
thecal sac.
3. Moderately severe right foraminal stenosis at L3-4.
4. Severe left facet arthritis at L4-5 and L5-S1.
5. No evidence of discitis, osteomyelitis, or other infection
involving the lumbar spine.
6. No acute fractures.

## 2021-09-29 DIAGNOSIS — M5416 Radiculopathy, lumbar region: Secondary | ICD-10-CM | POA: Diagnosis not present

## 2021-09-29 DIAGNOSIS — M961 Postlaminectomy syndrome, not elsewhere classified: Secondary | ICD-10-CM | POA: Diagnosis not present

## 2021-11-03 DIAGNOSIS — M5416 Radiculopathy, lumbar region: Secondary | ICD-10-CM | POA: Diagnosis not present

## 2021-12-05 DIAGNOSIS — M961 Postlaminectomy syndrome, not elsewhere classified: Secondary | ICD-10-CM | POA: Diagnosis not present

## 2021-12-05 DIAGNOSIS — Z6827 Body mass index (BMI) 27.0-27.9, adult: Secondary | ICD-10-CM | POA: Diagnosis not present

## 2021-12-05 DIAGNOSIS — M5416 Radiculopathy, lumbar region: Secondary | ICD-10-CM | POA: Diagnosis not present

## 2021-12-06 ENCOUNTER — Ambulatory Visit: Payer: Medicare Other | Attending: Internal Medicine | Admitting: Internal Medicine

## 2021-12-06 ENCOUNTER — Encounter: Payer: Self-pay | Admitting: Internal Medicine

## 2021-12-06 VITALS — BP 124/68 | HR 56 | Ht 64.0 in | Wt 170.0 lb

## 2021-12-06 DIAGNOSIS — I10 Essential (primary) hypertension: Secondary | ICD-10-CM

## 2021-12-06 DIAGNOSIS — I25118 Atherosclerotic heart disease of native coronary artery with other forms of angina pectoris: Secondary | ICD-10-CM | POA: Diagnosis not present

## 2021-12-06 DIAGNOSIS — I739 Peripheral vascular disease, unspecified: Secondary | ICD-10-CM | POA: Diagnosis not present

## 2021-12-06 DIAGNOSIS — I5020 Unspecified systolic (congestive) heart failure: Secondary | ICD-10-CM | POA: Diagnosis not present
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
## 2021-12-06 NOTE — Patient Instructions (Signed)
Medication Instructions:  Your physician recommends that you continue on your current medications as directed. Please refer to the Current Medication list given to you today.  *If you need a refill on your cardiac medications before your next appointment, please call your pharmacy*   Lab Work: NONE If you have labs (blood work) drawn today and your tests are completely normal, you will receive your results only by: [place] (if you have MyChart) OR A paper copy in the mail If you have any lab test that is abnormal or we need to change your treatment, we will call you to review the results.   Testing/Procedures: NONE   Follow-Up: At [facility], you and your health needs are our priority.  As part of our continuing mission to provide you with exceptional heart care, we have created designated Provider Care Teams.  These Care Teams include your primary Cardiologist (physician) and Advanced Practice Providers (APPs -  Physician Assistants and Nurse Practitioners) who all work together to provide you with the care you need, when you need it.  We recommend signing up for the patient portal called "MyChart".  Sign up information is provided on this After Visit Summary.  MyChart is used to connect with patients for Virtual Visits (Telemedicine).  Patients are able to view lab/test results, encounter notes, upcoming appointments, etc.  Non-urgent messages can be sent to your provider as well.   To learn more about what you can do with MyChart, go to [email or web address].    Your next appointment:   4-5 month(s)  The format for your next appointment:   In Person  Provider:   Werner Lean, MD     Other Instructions  Important Information About Sugar

## 2021-12-11 ENCOUNTER — Other Ambulatory Visit: Payer: Self-pay | Admitting: Hematology

## 2021-12-12 ENCOUNTER — Encounter: Payer: Self-pay | Admitting: Hematology

## 2021-12-12 ENCOUNTER — Other Ambulatory Visit: Payer: Self-pay | Admitting: Physician Assistant

## 2021-12-12 DIAGNOSIS — M961 Postlaminectomy syndrome, not elsewhere classified: Secondary | ICD-10-CM

## 2021-12-25 ENCOUNTER — Ambulatory Visit
Admission: RE | Admit: 2021-12-25 | Discharge: 2021-12-25 | Disposition: A | Discharge status: Home / Self Care | Payer: Medicare Other | Source: Ambulatory Visit | Attending: Physician Assistant | Admitting: Physician Assistant

## 2021-12-25 DIAGNOSIS — M549 Dorsalgia, unspecified: Secondary | ICD-10-CM | POA: Diagnosis not present

## 2021-12-25 DIAGNOSIS — M961 Postlaminectomy syndrome, not elsewhere classified: Secondary | ICD-10-CM

## 2021-12-28 DIAGNOSIS — Z6827 Body mass index (BMI) 27.0-27.9, adult: Secondary | ICD-10-CM | POA: Diagnosis not present

## 2021-12-28 DIAGNOSIS — M5416 Radiculopathy, lumbar region: Secondary | ICD-10-CM | POA: Diagnosis not present

## 2021-12-28 DIAGNOSIS — M961 Postlaminectomy syndrome, not elsewhere classified: Secondary | ICD-10-CM | POA: Diagnosis not present

## 2022-01-04 DIAGNOSIS — M961 Postlaminectomy syndrome, not elsewhere classified: Secondary | ICD-10-CM | POA: Diagnosis not present

## 2022-01-04 DIAGNOSIS — M5416 Radiculopathy, lumbar region: Secondary | ICD-10-CM | POA: Diagnosis not present

## 2022-01-09 DIAGNOSIS — M5416 Radiculopathy, lumbar region: Secondary | ICD-10-CM | POA: Diagnosis not present

## 2022-01-09 DIAGNOSIS — M961 Postlaminectomy syndrome, not elsewhere classified: Secondary | ICD-10-CM | POA: Diagnosis not present

## 2022-01-27 DIAGNOSIS — M5380 Other specified dorsopathies, site unspecified: Secondary | ICD-10-CM | POA: Diagnosis not present

## 2022-01-27 DIAGNOSIS — G894 Chronic pain syndrome: Secondary | ICD-10-CM | POA: Diagnosis not present

## 2022-01-27 DIAGNOSIS — M961 Postlaminectomy syndrome, not elsewhere classified: Secondary | ICD-10-CM | POA: Diagnosis not present

## 2022-03-13 ENCOUNTER — Other Ambulatory Visit: Payer: Self-pay | Admitting: Hematology

## 2022-03-21 DIAGNOSIS — Z1231 Encounter for screening mammogram for malignant neoplasm of breast: Secondary | ICD-10-CM | POA: Diagnosis not present

## 2022-03-22 DIAGNOSIS — M961 Postlaminectomy syndrome, not elsewhere classified: Secondary | ICD-10-CM | POA: Diagnosis not present

## 2022-03-22 DIAGNOSIS — M5416 Radiculopathy, lumbar region: Secondary | ICD-10-CM | POA: Diagnosis not present

## 2022-03-22 DIAGNOSIS — Z6827 Body mass index (BMI) 27.0-27.9, adult: Secondary | ICD-10-CM | POA: Diagnosis not present

## 2022-03-30 ENCOUNTER — Encounter: Payer: Self-pay | Admitting: Hematology

## 2022-03-31 ENCOUNTER — Encounter: Payer: Self-pay | Admitting: Internal Medicine

## 2022-04-03 DIAGNOSIS — E1151 Type 2 diabetes mellitus with diabetic peripheral angiopathy without gangrene: Secondary | ICD-10-CM | POA: Diagnosis not present

## 2022-04-03 DIAGNOSIS — D508 Other iron deficiency anemias: Secondary | ICD-10-CM | POA: Diagnosis not present

## 2022-04-03 DIAGNOSIS — E785 Hyperlipidemia, unspecified: Secondary | ICD-10-CM | POA: Diagnosis not present

## 2022-04-03 DIAGNOSIS — D509 Iron deficiency anemia, unspecified: Secondary | ICD-10-CM | POA: Diagnosis not present

## 2022-04-10 ENCOUNTER — Ambulatory Visit: Payer: Medicare Other | Admitting: Internal Medicine

## 2022-04-10 DIAGNOSIS — M5416 Radiculopathy, lumbar region: Secondary | ICD-10-CM | POA: Diagnosis not present

## 2022-04-10 DIAGNOSIS — M961 Postlaminectomy syndrome, not elsewhere classified: Secondary | ICD-10-CM | POA: Diagnosis not present

## 2022-04-10 DIAGNOSIS — F1120 Opioid dependence, uncomplicated: Secondary | ICD-10-CM | POA: Diagnosis not present

## 2022-04-10 DIAGNOSIS — Z9689 Presence of other specified functional implants: Secondary | ICD-10-CM | POA: Diagnosis not present
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
## 2022-04-17 ENCOUNTER — Encounter: Payer: Self-pay | Admitting: Hematology

## 2022-04-17 ENCOUNTER — Encounter: Payer: Self-pay | Admitting: Internal Medicine

## 2022-04-17 ENCOUNTER — Ambulatory Visit: Payer: Medicare Other | Attending: Internal Medicine | Admitting: Internal Medicine

## 2022-04-17 VITALS — BP 150/74 | HR 88 | Ht 64.0 in | Wt 170.0 lb

## 2022-04-17 DIAGNOSIS — I739 Peripheral vascular disease, unspecified: Secondary | ICD-10-CM | POA: Diagnosis not present

## 2022-04-17 DIAGNOSIS — N1832 Chronic kidney disease, stage 3b: Secondary | ICD-10-CM

## 2022-04-17 DIAGNOSIS — I10 Essential (primary) hypertension: Secondary | ICD-10-CM

## 2022-04-17 DIAGNOSIS — I25118 Atherosclerotic heart disease of native coronary artery with other forms of angina pectoris: Secondary | ICD-10-CM | POA: Diagnosis not present

## 2022-04-17 MED ORDER — HYDRALAZINE HCL 50 MG PO TABS: 50.0000 mg | ORAL_TABLET | Freq: Two times a day (BID) | ORAL | 3 refills | Status: AC

## 2022-04-17 NOTE — Patient Instructions (Signed)
Medication Instructions:  Your physician has recommended you make the following change in your medication:  INCREASE: hydralazine to 50 mg by mouth twice daily  *If you need a refill on your cardiac medications before your next appointment, please call your pharmacy*   Lab Work: NONE  If you have labs (blood work) drawn today and your tests are completely normal, you will receive your results only by: [place] (if you have MyChart) OR A paper copy in the mail If you have any lab test that is abnormal or we need to change your treatment, we will call you to review the results.   Testing/Procedures: Your physician has requested that you follow up with Dr. Gwenlyn Found for PAD.    Follow-Up: At [facility], you and your health needs are our priority.  As part of our continuing mission to provide you with exceptional heart care, we have created designated Provider Care Teams.  These Care Teams include your primary Cardiologist (physician) and Advanced Practice Providers (APPs -  Physician Assistants and Nurse Practitioners) who all work together to provide you with the care you need, when you need it.  We recommend signing up for the patient portal called "MyChart".  Sign up information is provided on this After Visit Summary.  MyChart is used to connect with patients for Virtual Visits (Telemedicine).  Patients are able to view lab/test results, encounter notes, upcoming appointments, etc.  Non-urgent messages can be sent to your provider as well.   To learn more about what you can do with MyChart, go to [email or web address].    Your next appointment:   6 month(s)  Provider:   Werner Lean, MD

## 2022-04-24 DIAGNOSIS — E1151 Type 2 diabetes mellitus with diabetic peripheral angiopathy without gangrene: Secondary | ICD-10-CM | POA: Diagnosis not present

## 2022-04-24 DIAGNOSIS — I129 Hypertensive chronic kidney disease with stage 1 through stage 4 chronic kidney disease, or unspecified chronic kidney disease: Secondary | ICD-10-CM | POA: Diagnosis not present

## 2022-04-24 DIAGNOSIS — Z0000 Encounter for general adult medical examination without abnormal findings: Secondary | ICD-10-CM | POA: Diagnosis not present

## 2022-04-24 DIAGNOSIS — I739 Peripheral vascular disease, unspecified: Secondary | ICD-10-CM | POA: Diagnosis not present

## 2022-04-24 DIAGNOSIS — Z1331 Encounter for screening for depression: Secondary | ICD-10-CM | POA: Diagnosis not present

## 2022-04-24 DIAGNOSIS — N1832 Chronic kidney disease, stage 3b: Secondary | ICD-10-CM | POA: Diagnosis not present

## 2022-04-24 DIAGNOSIS — Z1339 Encounter for screening examination for other mental health and behavioral disorders: Secondary | ICD-10-CM | POA: Diagnosis not present

## 2022-05-18 DIAGNOSIS — Z85828 Personal history of other malignant neoplasm of skin: Secondary | ICD-10-CM | POA: Diagnosis not present

## 2022-05-18 DIAGNOSIS — L820 Inflamed seborrheic keratosis: Secondary | ICD-10-CM | POA: Diagnosis not present

## 2022-05-18 DIAGNOSIS — L821 Other seborrheic keratosis: Secondary | ICD-10-CM | POA: Diagnosis not present

## 2022-06-08 DIAGNOSIS — Z9689 Presence of other specified functional implants: Secondary | ICD-10-CM | POA: Diagnosis not present

## 2022-06-08 DIAGNOSIS — F1120 Opioid dependence, uncomplicated: Secondary | ICD-10-CM | POA: Diagnosis not present

## 2022-06-08 DIAGNOSIS — M961 Postlaminectomy syndrome, not elsewhere classified: Secondary | ICD-10-CM | POA: Diagnosis not present

## 2022-06-08 DIAGNOSIS — M5416 Radiculopathy, lumbar region: Secondary | ICD-10-CM | POA: Diagnosis not present

## 2022-06-09 MED ORDER — METOPROLOL SUCCINATE ER 50 MG PO TB24: 50.0000 mg | ORAL_TABLET | Freq: Every day | ORAL | 3 refills | Status: DC

## 2022-06-20 ENCOUNTER — Telehealth: Payer: Self-pay | Admitting: Orthopedic Surgery

## 2022-06-20 NOTE — Telephone Encounter (Signed)
Patient wan to know if she can hardware removed? [phone]

## 2022-06-21 NOTE — Telephone Encounter (Signed)
Can you please call pt and make an appt for Dr. Sharol Given to discuss please?

## 2022-06-26 DIAGNOSIS — M5416 Radiculopathy, lumbar region: Secondary | ICD-10-CM | POA: Diagnosis not present

## 2022-07-03 ENCOUNTER — Encounter: Payer: Self-pay | Admitting: Hematology

## 2022-07-03 ENCOUNTER — Other Ambulatory Visit (HOSPITAL_COMMUNITY): Payer: Self-pay

## 2022-07-03 MED ORDER — MOUNJARO 5 MG/0.5ML ~~LOC~~ SOAJ
5.0000 mg | SUBCUTANEOUS | 3 refills | Status: DC
  Filled 2022-07-03: qty 2, 28d supply, fill #0
  Filled 2022-07-26: qty 2, 28d supply, fill #1

## 2022-07-04 ENCOUNTER — Encounter: Payer: Self-pay | Admitting: Orthopedic Surgery

## 2022-07-04 ENCOUNTER — Other Ambulatory Visit (INDEPENDENT_AMBULATORY_CARE_PROVIDER_SITE_OTHER): Payer: Medicare Other

## 2022-07-04 ENCOUNTER — Ambulatory Visit: Payer: Medicare Other | Admitting: Orthopedic Surgery

## 2022-07-04 ENCOUNTER — Other Ambulatory Visit (HOSPITAL_COMMUNITY): Payer: Self-pay

## 2022-07-04 DIAGNOSIS — G8929 Other chronic pain: Secondary | ICD-10-CM | POA: Diagnosis not present

## 2022-07-04 DIAGNOSIS — M79601 Pain in right arm: Secondary | ICD-10-CM

## 2022-07-04 DIAGNOSIS — H524 Presbyopia: Secondary | ICD-10-CM | POA: Diagnosis not present

## 2022-07-04 DIAGNOSIS — M25561 Pain in right knee: Secondary | ICD-10-CM | POA: Diagnosis not present

## 2022-07-04 DIAGNOSIS — E119 Type 2 diabetes mellitus without complications: Secondary | ICD-10-CM | POA: Diagnosis not present

## 2022-07-04 MED ORDER — METHYLPREDNISOLONE ACETATE 40 MG/ML IJ SUSP
40.0000 mg | INTRAMUSCULAR | Status: AC | PRN
  Administered 2022-07-04: 40 mg via INTRA_ARTICULAR

## 2022-07-04 MED ORDER — LIDOCAINE HCL (PF) 1 % IJ SOLN
5.0000 mL | INTRAMUSCULAR | Status: AC | PRN
  Administered 2022-07-04: 5 mL
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
## 2022-07-12 ENCOUNTER — Ambulatory Visit: Payer: Medicare Other | Admitting: Cardiovascular Disease

## 2022-07-20 DIAGNOSIS — M5416 Radiculopathy, lumbar region: Secondary | ICD-10-CM | POA: Diagnosis not present

## 2022-07-20 DIAGNOSIS — Z9689 Presence of other specified functional implants: Secondary | ICD-10-CM | POA: Diagnosis not present

## 2022-07-20 DIAGNOSIS — Z6827 Body mass index (BMI) 27.0-27.9, adult: Secondary | ICD-10-CM | POA: Diagnosis not present

## 2022-07-20 DIAGNOSIS — F1120 Opioid dependence, uncomplicated: Secondary | ICD-10-CM | POA: Diagnosis not present

## 2022-07-27 ENCOUNTER — Other Ambulatory Visit (HOSPITAL_COMMUNITY): Payer: Self-pay

## 2022-07-27 ENCOUNTER — Other Ambulatory Visit: Payer: Self-pay

## 2022-07-31 ENCOUNTER — Other Ambulatory Visit (HOSPITAL_COMMUNITY): Payer: Self-pay

## 2022-07-31 MED ORDER — MOUNJARO 7.5 MG/0.5ML ~~LOC~~ SOAJ
7.5000 mg | SUBCUTANEOUS | 5 refills | Status: DC
  Filled 2022-07-31: qty 2, 28d supply, fill #0
  Filled 2022-09-18: qty 2, 28d supply, fill #1
  Filled 2022-10-16: qty 2, 28d supply, fill #2

## 2022-08-04 ENCOUNTER — Other Ambulatory Visit (HOSPITAL_COMMUNITY): Payer: Self-pay

## 2022-08-08 ENCOUNTER — Other Ambulatory Visit (HOSPITAL_COMMUNITY): Payer: Self-pay

## 2022-08-09 ENCOUNTER — Other Ambulatory Visit (HOSPITAL_COMMUNITY): Payer: Self-pay

## 2022-08-22 ENCOUNTER — Ambulatory Visit: Payer: Medicare Other | Admitting: Cardiovascular Disease

## 2022-08-23 ENCOUNTER — Encounter: Payer: Self-pay | Admitting: Cardiovascular Disease

## 2022-08-23 ENCOUNTER — Ambulatory Visit: Payer: Medicare Other | Attending: Cardiovascular Disease | Admitting: Cardiovascular Disease

## 2022-08-23 VITALS — BP 128/68 | HR 73 | Ht 64.0 in | Wt 153.0 lb

## 2022-08-23 DIAGNOSIS — I739 Peripheral vascular disease, unspecified: Secondary | ICD-10-CM

## 2022-08-23 NOTE — Progress Notes (Signed)
08/23/2022 Anna [name]   09/22/1950  [id number]  Primary Physician Garlan Fillers, MD Primary Cardiologist: Runell Gess MD Nicholes Calamity, [place]  HPI:  Anna [name] is a 72 y.o.   mildly overweight married Caucasian female mother of 2, grandmother of 3 grandchildren who is accompanied by her husband Anna [name] today.  She is a retired Interior and spatial designer.  She was referred by her cardiologist, Dr.Chandrasekhar for evaluation of PAD.  I last saw her in the office 02/23/2021.  She has a history of hypertension and diabetes.  She recently had LAD and circumflex obtuse marginal branch stenting by Dr. Herbie Baltimore 07/27/2020.  She has chronic renal insufficiency (CKD 3B) Doppler studies performed 08/11/2020 revealed a right ABI of 0.96 and a left of 0.99.  She had a high-frequency signal in her mid and distal right SFA and moderate disease in her left.  She does complain of right greater than left lower extremity lifestyle limiting claudication.  Unfortunately, because of her renal insufficiency, the decision to perform angiography especially diabetic becomes more complicated.  Since I saw her a year and a half ago she is complaining of sciatica type pain when she is lying down, sitting down and standing up.  She really denies claudication symptoms.   Current Meds  Medication Sig   acetaminophen (TYLENOL) 325 MG tablet Take 325 mg by mouth 3 (three) times daily as needed for moderate pain or mild pain.   amLODipine (NORVASC) 5 MG tablet Take 1 tablet (5 mg total) by mouth daily.   ascorbic acid (VITAMIN C) 500 MG tablet Take 500 mg by mouth daily.   aspirin EC 81 MG tablet Take 81 mg by mouth daily. Swallow whole.   atorvastatin (LIPITOR) 40 MG tablet Take 40 mg by mouth daily.    b complex vitamins capsule Take 1 capsule by mouth daily.   Cranberry 425 MG CAPS Take 425 mg by mouth daily.   fenofibrate 160 MG tablet Take 1 tablet (160 mg total) by mouth daily.   Glucosamine HCl 1000 MG TABS daily.  Take one tab daily po   hydrALAZINE (APRESOLINE) 50 MG tablet Take 1 tablet (50 mg total) by mouth 2 (two) times daily.   hydroxyurea (HYDREA) 500 MG capsule TAKE 1 CAPSULE BY MOUTH ONCE DAILY WITH A MEAL   LANTUS SOLOSTAR 100 UNIT/ML Solostar Pen Inject 15 Units into the skin daily.   Magnesium 200 MG TABS Take 200 mg by mouth daily.   metoprolol succinate (TOPROL-XL) 50 MG 24 hr tablet Take 1 tablet (50 mg total) by mouth daily. Take with or immediately following a meal.   Probiotic Product (PHILLIPS COLON HEALTH) CAPS daily.   trimethoprim (TRIMPEX) 100 MG tablet Take 100 mg by mouth at bedtime.     Allergies  Allergen Reactions   Antihistamines, Diphenhydramine-Type Other (See Comments)    unknown   Simvastatin Other (See Comments)    unknown   Benadryl [Diphenhydramine Hcl] Other (See Comments)    chills    Social History   Socioeconomic History   Marital status: Married    Spouse name: Anna [name]   Number of children: 2   Years of education: 12 +   Highest education level: Associate degree: occupational, Scientist, product/process development, or vocational program  Occupational History   Occupation: retired  Tobacco Use   Smoking status: Former    Packs/day: 0.15    Years: 56.00    Additional pack years: 0.00    Total pack years:  8.40    Types: Cigarettes    Quit date: 06/18/2020    Years since quitting: 2.1   Smokeless tobacco: Never   Tobacco comments:    smokes 3/day.   Vaping Use   Vaping Use: Never used  Substance and Sexual Activity   Alcohol use: Never   Drug use: No   Sexual activity: Not on file  Other Topics Concern   Not on file  Social History Narrative   ** Merged History Encounter **       Lives with husband No caffeine   Social Determinants of Health   Financial Resource Strain: Low Risk  (07/02/2020)   Overall Financial Resource Strain (CARDIA)    Difficulty of Paying Living Expenses: Not very hard  Food Insecurity: No Food Insecurity (07/02/2020)   Hunger Vital  Sign    Worried About Running Out of Food in the Last Year: Never true    Ran Out of Food in the Last Year: Never true  Transportation Needs: No Transportation Needs (07/02/2020)   PRAPARE - Administrator, Civil Service (Medical): No    Lack of Transportation (Non-Medical): No  Physical Activity: Not on file  Stress: Not on file  Social Connections: Not on file  Intimate Partner Violence: Not on file     Review of Systems: General: negative for chills, fever, night sweats or weight changes.  Cardiovascular: negative for chest pain, dyspnea on exertion, edema, orthopnea, palpitations, paroxysmal nocturnal dyspnea or shortness of breath Dermatological: negative for rash Respiratory: negative for cough or wheezing Urologic: negative for hematuria Abdominal: negative for nausea, vomiting, diarrhea, bright red blood per rectum, melena, or hematemesis Neurologic: negative for visual changes, syncope, or dizziness All other systems reviewed and are otherwise negative except as noted above.    Blood pressure 128/68, pulse 73, height 5\' 4"  (1.626 m), weight 153 lb (69.4 kg), SpO2 96 %.  General appearance: alert and no distress Neck: no adenopathy, no carotid bruit, no JVD, supple, symmetrical, trachea midline, and thyroid not enlarged, symmetric, no tenderness/mass/nodules Lungs: clear to auscultation bilaterally Heart: regular rate and rhythm, S1, S2 normal, no murmur, click, rub or gallop Extremities: extremities normal, atraumatic, no cyanosis or edema Pulses: 2+ and symmetric Skin: Skin color, texture, turgor normal. No rashes or lesions Neurologic: Grossly normal  EKG sinus rhythm at 73 without ST or T wave changes.  There was poor R wave progression.  I personally reviewed this EKG.  ASSESSMENT AND PLAN:   PAD (peripheral artery disease) (HCC) Anna [name] returns today for follow-up of PAD.  I last saw her in the office 02/23/2021.  She had Dopplers performed 08/11/2020  revealing a right ABI of 0.96, left of 0.99 with high-frequency signals in her mid and distal right SFA and mid left SFA.  She has had 2 back surgeries, and 2000 2021.  She has symptoms consistent with sciatica.  She has pain both at rest, lying down and standing up going down the lateral aspect of her right leg.  She really denies claudication symptoms.  She also has moderate renal insufficiency.  At this point, I do not think she needs any invasive evaluation.     Runell Gess MD FACP,FACC,FAHA, [facility] 08/23/2022 4:44 PM

## 2022-08-23 NOTE — Patient Instructions (Signed)
Medication Instructions:  Your physician recommends that you continue on your current medications as directed. Please refer to the Current Medication list given to you today.  *If you need a refill on your cardiac medications before your next appointment, please call your pharmacy*   Testing/Procedures: Your physician has requested that you have a lower extremity arterial duplex. During this test, ultrasound is used to evaluate arterial blood flow in the legs. Allow one hour for this exam. There are no restrictions or special instructions. This will take place at [address] [facility], [address].  Your physician has requested that you have an ankle brachial index (ABI). During this test an ultrasound and blood pressure cuff are used to evaluate the arteries that supply the arms and legs with blood. Allow thirty minutes for this exam. There are no restrictions or special instructions. This will take place at [address] [facility], [address].     Follow-Up: At [facility], you and your health needs are our priority.  As part of our continuing mission to provide you with exceptional heart care, we have created designated Provider Care Teams.  These Care Teams include your primary Cardiologist (physician) and Advanced Practice Providers (APPs -  Physician Assistants and Nurse Practitioners) who all work together to provide you with the care you need, when you need it.  We recommend signing up for the patient portal called "MyChart".  Sign up information is provided on this After Visit Summary.  MyChart is used to connect with patients for Virtual Visits (Telemedicine).  Patients are able to view lab/test results, encounter notes, upcoming appointments, etc.  Non-urgent messages can be sent to your provider as well.   To learn more about what you can do with MyChart, go to [email or web address].    Your next appointment:   12 month(s)  Provider:   Nanetta Batty, MD

## 2022-08-23 NOTE — Assessment & Plan Note (Signed)
Anna [name] returns today for follow-up of PAD.  I last saw her in the office 02/23/2021.  She had Dopplers performed 08/11/2020 revealing a right ABI of 0.96, left of 0.99 with high-frequency signals in her mid and distal right SFA and mid left SFA.  She has had 2 back surgeries, and 2000 2021.  She has symptoms consistent with sciatica.  She has pain both at rest, lying down and standing up going down the lateral aspect of her right leg.  She really denies claudication symptoms.  She also has moderate renal insufficiency.  At this point, I do not think she needs any invasive evaluation.

## 2022-08-30 DIAGNOSIS — L03113 Cellulitis of right upper limb: Secondary | ICD-10-CM | POA: Diagnosis not present

## 2022-08-30 DIAGNOSIS — E119 Type 2 diabetes mellitus without complications: Secondary | ICD-10-CM | POA: Diagnosis not present

## 2022-08-30 DIAGNOSIS — T63441A Toxic effect of venom of bees, accidental (unintentional), initial encounter: Secondary | ICD-10-CM | POA: Diagnosis not present

## 2022-09-11 ENCOUNTER — Encounter (HOSPITAL_COMMUNITY): Payer: Medicare Other

## 2022-09-18 ENCOUNTER — Ambulatory Visit (HOSPITAL_COMMUNITY)
Admission: RE | Admit: 2022-09-18 | Discharge: 2022-09-18 | Disposition: A | Discharge status: Home / Self Care | Payer: Medicare Other | Source: Ambulatory Visit | Attending: Cardiology | Admitting: Cardiology

## 2022-09-18 DIAGNOSIS — I739 Peripheral vascular disease, unspecified: Secondary | ICD-10-CM | POA: Insufficient documentation

## 2022-09-18 LAB — VAS US ABI WITH/WO TBI
Left ABI: 0.82
Right ABI: 0.58

## 2022-09-19 ENCOUNTER — Other Ambulatory Visit (HOSPITAL_COMMUNITY): Payer: Self-pay

## 2022-09-20 ENCOUNTER — Other Ambulatory Visit (HOSPITAL_COMMUNITY): Payer: Self-pay

## 2022-10-13 ENCOUNTER — Other Ambulatory Visit: Payer: Self-pay | Admitting: Hematology

## 2022-10-16 ENCOUNTER — Other Ambulatory Visit (HOSPITAL_COMMUNITY): Payer: Self-pay

## 2022-10-16 DIAGNOSIS — M5416 Radiculopathy, lumbar region: Secondary | ICD-10-CM | POA: Diagnosis not present
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
## 2022-10-18 ENCOUNTER — Ambulatory Visit: Payer: Medicare Other | Attending: Internal Medicine | Admitting: Internal Medicine

## 2022-10-18 ENCOUNTER — Encounter: Payer: Self-pay | Admitting: Internal Medicine

## 2022-10-18 VITALS — BP 120/70 | HR 62 | Ht 64.0 in | Wt 147.8 lb

## 2022-10-18 DIAGNOSIS — I25118 Atherosclerotic heart disease of native coronary artery with other forms of angina pectoris: Secondary | ICD-10-CM | POA: Diagnosis not present

## 2022-10-18 DIAGNOSIS — I10 Essential (primary) hypertension: Secondary | ICD-10-CM

## 2022-10-18 DIAGNOSIS — I739 Peripheral vascular disease, unspecified: Secondary | ICD-10-CM | POA: Diagnosis not present

## 2022-10-18 DIAGNOSIS — N1832 Chronic kidney disease, stage 3b: Secondary | ICD-10-CM | POA: Diagnosis not present

## 2022-10-18 NOTE — Patient Instructions (Signed)
Medication Instructions:  Your physician has recommended you make the following change in your medication:  STOP: Fenofibrate  *If you need a refill on your cardiac medications before your next appointment, please call your pharmacy*   Lab Work: Please have Primary care Provider send over cholesterol lab results  If you have labs (blood work) drawn today and your tests are completely normal, you will receive your results only by: MyChart Message (if you have MyChart) OR A paper copy in the mail If you have any lab test that is abnormal or we need to change your treatment, we will call you to review the results.   Testing/Procedures: NONE   Follow-Up: At [facility], you and your health needs are our priority.  As part of our continuing mission to provide you with exceptional heart care, we have created designated Provider Care Teams.  These Care Teams include your primary Cardiologist (physician) and Advanced Practice Providers (APPs -  Physician Assistants and Nurse Practitioners) who all work together to provide you with the care you need, when you need it.  We recommend signing up for the patient portal called "MyChart".  Sign up information is provided on this After Visit Summary.  MyChart is used to connect with patients for Virtual Visits (Telemedicine).  Patients are able to view lab/test results, encounter notes, upcoming appointments, etc.  Non-urgent messages can be sent to your provider as well.   To learn more about what you can do with MyChart, go to [email or web address].    Your next appointment:   1 year(s)  Provider:   Riley Lam, MD

## 2022-10-30 ENCOUNTER — Other Ambulatory Visit (HOSPITAL_COMMUNITY): Payer: Self-pay

## 2022-10-30 MED ORDER — MOUNJARO 10 MG/0.5ML ~~LOC~~ SOAJ
10.0000 mg | SUBCUTANEOUS | 3 refills | Status: DC
  Filled 2022-10-30: qty 2, 28d supply, fill #0
  Filled 2022-11-27: qty 2, 28d supply, fill #1

## 2022-10-31 ENCOUNTER — Telehealth: Payer: Self-pay | Admitting: Cardiovascular Disease

## 2022-10-31 ENCOUNTER — Other Ambulatory Visit (HOSPITAL_COMMUNITY): Payer: Self-pay

## 2022-10-31 NOTE — Telephone Encounter (Signed)
Left voicemail to return call to office.

## 2022-10-31 NOTE — Telephone Encounter (Signed)
Patient states when she saw Dr. Allyson Sabal he asked if she was experiencing any leg pain. Patient states at the time she answered, "no" because she didn't have any leg pain. Recently, patient states she has experienced severe cramping in her left calf that radiates down to her toes. It lasts about 2-3 minutes and she is unable to stand or walk. Patient states this has occurred about 3 times.

## 2022-11-02 DIAGNOSIS — L820 Inflamed seborrheic keratosis: Secondary | ICD-10-CM | POA: Diagnosis not present

## 2022-11-09 DIAGNOSIS — M961 Postlaminectomy syndrome, not elsewhere classified: Secondary | ICD-10-CM | POA: Diagnosis not present

## 2022-11-09 DIAGNOSIS — Z9689 Presence of other specified functional implants: Secondary | ICD-10-CM | POA: Diagnosis not present

## 2022-11-09 DIAGNOSIS — F1120 Opioid dependence, uncomplicated: Secondary | ICD-10-CM | POA: Diagnosis not present

## 2022-11-09 DIAGNOSIS — M5416 Radiculopathy, lumbar region: Secondary | ICD-10-CM | POA: Diagnosis not present

## 2022-11-27 ENCOUNTER — Other Ambulatory Visit: Payer: Self-pay

## 2022-11-27 ENCOUNTER — Other Ambulatory Visit (HOSPITAL_COMMUNITY): Payer: Self-pay

## 2022-11-27 DIAGNOSIS — N1832 Chronic kidney disease, stage 3b: Secondary | ICD-10-CM | POA: Diagnosis not present

## 2022-11-27 DIAGNOSIS — E1151 Type 2 diabetes mellitus with diabetic peripheral angiopathy without gangrene: Secondary | ICD-10-CM | POA: Diagnosis not present

## 2022-11-27 MED ORDER — MOUNJARO 12.5 MG/0.5ML ~~LOC~~ SOAJ
12.5000 mg | SUBCUTANEOUS | 3 refills | Status: DC
  Filled 2022-11-27 – 2022-11-28 (×2): qty 2, 28d supply, fill #0

## 2022-11-28 ENCOUNTER — Other Ambulatory Visit (HOSPITAL_COMMUNITY): Payer: Self-pay

## 2022-12-21 DIAGNOSIS — F1120 Opioid dependence, uncomplicated: Secondary | ICD-10-CM | POA: Diagnosis not present

## 2022-12-21 DIAGNOSIS — M5416 Radiculopathy, lumbar region: Secondary | ICD-10-CM | POA: Diagnosis not present

## 2022-12-21 DIAGNOSIS — Z6825 Body mass index (BMI) 25.0-25.9, adult: Secondary | ICD-10-CM | POA: Diagnosis not present

## 2022-12-21 DIAGNOSIS — M961 Postlaminectomy syndrome, not elsewhere classified: Secondary | ICD-10-CM | POA: Diagnosis not present

## 2022-12-25 ENCOUNTER — Other Ambulatory Visit (HOSPITAL_COMMUNITY): Payer: Self-pay

## 2022-12-25 MED ORDER — MOUNJARO 15 MG/0.5ML ~~LOC~~ SOAJ
15.0000 mg | SUBCUTANEOUS | 3 refills | Status: DC
  Filled 2022-12-25: qty 2, 28d supply, fill #0
  Filled 2023-01-21: qty 2, 28d supply, fill #1
  Filled 2023-02-18: qty 2, 28d supply, fill #2
  Filled 2023-03-25: qty 2, 28d supply, fill #3

## 2023-02-13 DIAGNOSIS — M5416 Radiculopathy, lumbar region: Secondary | ICD-10-CM | POA: Diagnosis not present

## 2023-02-19 ENCOUNTER — Other Ambulatory Visit (HOSPITAL_COMMUNITY): Payer: Self-pay

## 2023-02-28 DIAGNOSIS — H16141 Punctate keratitis, right eye: Secondary | ICD-10-CM | POA: Diagnosis not present

## 2023-03-23 DIAGNOSIS — Z1231 Encounter for screening mammogram for malignant neoplasm of breast: Secondary | ICD-10-CM | POA: Diagnosis not present

## 2023-03-26 ENCOUNTER — Other Ambulatory Visit (HOSPITAL_COMMUNITY): Payer: Self-pay

## 2023-03-27 ENCOUNTER — Other Ambulatory Visit (HOSPITAL_COMMUNITY): Payer: Self-pay

## 2023-05-01 DIAGNOSIS — D509 Iron deficiency anemia, unspecified: Secondary | ICD-10-CM | POA: Diagnosis not present

## 2023-05-01 DIAGNOSIS — E785 Hyperlipidemia, unspecified: Secondary | ICD-10-CM | POA: Diagnosis not present

## 2023-05-01 DIAGNOSIS — E1151 Type 2 diabetes mellitus with diabetic peripheral angiopathy without gangrene: Secondary | ICD-10-CM | POA: Diagnosis not present

## 2023-05-01 DIAGNOSIS — N1832 Chronic kidney disease, stage 3b: Secondary | ICD-10-CM | POA: Diagnosis not present

## 2023-05-17 DIAGNOSIS — M5416 Radiculopathy, lumbar region: Secondary | ICD-10-CM | POA: Diagnosis not present

## 2023-05-17 DIAGNOSIS — F1120 Opioid dependence, uncomplicated: Secondary | ICD-10-CM | POA: Diagnosis not present

## 2023-05-17 DIAGNOSIS — Z9689 Presence of other specified functional implants: Secondary | ICD-10-CM | POA: Diagnosis not present

## 2023-05-22 ENCOUNTER — Other Ambulatory Visit (HOSPITAL_COMMUNITY): Payer: Self-pay

## 2023-05-28 ENCOUNTER — Other Ambulatory Visit (HOSPITAL_COMMUNITY): Payer: Self-pay

## 2023-05-28 MED ORDER — MOUNJARO 15 MG/0.5ML ~~LOC~~ SOAJ
15.0000 mg | SUBCUTANEOUS | 3 refills | Status: DC
  Filled 2023-05-28: qty 4, 56d supply, fill #0
  Filled 2023-05-28: qty 2, 28d supply, fill #0
  Filled 2023-06-04: qty 4, 56d supply, fill #0
  Filled 2023-06-06: qty 2, 28d supply, fill #0

## 2023-06-04 ENCOUNTER — Other Ambulatory Visit (HOSPITAL_COMMUNITY): Payer: Self-pay

## 2023-06-04 DIAGNOSIS — Z1339 Encounter for screening examination for other mental health and behavioral disorders: Secondary | ICD-10-CM | POA: Diagnosis not present

## 2023-06-04 DIAGNOSIS — Z0000 Encounter for general adult medical examination without abnormal findings: Secondary | ICD-10-CM | POA: Diagnosis not present

## 2023-06-04 DIAGNOSIS — Z1331 Encounter for screening for depression: Secondary | ICD-10-CM | POA: Diagnosis not present

## 2023-06-04 DIAGNOSIS — E1151 Type 2 diabetes mellitus with diabetic peripheral angiopathy without gangrene: Secondary | ICD-10-CM | POA: Diagnosis not present

## 2023-06-04 DIAGNOSIS — M5416 Radiculopathy, lumbar region: Secondary | ICD-10-CM | POA: Diagnosis not present

## 2023-06-04 DIAGNOSIS — I129 Hypertensive chronic kidney disease with stage 1 through stage 4 chronic kidney disease, or unspecified chronic kidney disease: Secondary | ICD-10-CM | POA: Diagnosis not present

## 2023-06-04 MED ORDER — MOUNJARO 12.5 MG/0.5ML ~~LOC~~ SOAJ
12.5000 mg | SUBCUTANEOUS | 5 refills | Status: AC
  Filled 2023-06-04 (×2): qty 2, 28d supply, fill #0
  Filled 2023-07-02 – 2023-07-16 (×2): qty 2, 28d supply, fill #1
  Filled 2023-08-23: qty 2, 28d supply, fill #2
  Filled 2023-10-30: qty 2, 28d supply, fill #3
  Filled 2024-01-20: qty 2, 28d supply, fill #4
  Filled 2024-03-24 – 2024-04-03 (×2): qty 2, 28d supply, fill #5

## 2023-06-04 MED ORDER — ATORVASTATIN CALCIUM 40 MG PO TABS
40.0000 mg | ORAL_TABLET | Freq: Every day | ORAL | 4 refills | Status: AC
Start: 2023-06-04 — End: ?
  Filled 2023-06-04: qty 90, 90d supply, fill #0

## 2023-06-06 ENCOUNTER — Other Ambulatory Visit (HOSPITAL_COMMUNITY): Payer: Self-pay

## 2023-06-12 ENCOUNTER — Encounter: Payer: Self-pay | Admitting: Internal Medicine

## 2023-06-13 ENCOUNTER — Other Ambulatory Visit: Payer: Self-pay | Admitting: Internal Medicine

## 2023-06-13 DIAGNOSIS — F17210 Nicotine dependence, cigarettes, uncomplicated: Secondary | ICD-10-CM

## 2023-06-15 ENCOUNTER — Other Ambulatory Visit: Payer: Self-pay | Admitting: Internal Medicine

## 2023-06-15 DIAGNOSIS — F17210 Nicotine dependence, cigarettes, uncomplicated: Secondary | ICD-10-CM

## 2023-06-18 ENCOUNTER — Encounter: Payer: Self-pay | Admitting: Internal Medicine

## 2023-06-21 ENCOUNTER — Other Ambulatory Visit

## 2023-06-26 ENCOUNTER — Telehealth: Payer: Self-pay | Admitting: Hematology

## 2023-06-26 DIAGNOSIS — L97919 Non-pressure chronic ulcer of unspecified part of right lower leg with unspecified severity: Secondary | ICD-10-CM | POA: Diagnosis not present

## 2023-06-26 NOTE — Telephone Encounter (Signed)
 Spoke with patient confirming upcoming appointment

## 2023-07-02 DIAGNOSIS — I83018 Varicose veins of right lower extremity with ulcer other part of lower leg: Secondary | ICD-10-CM | POA: Diagnosis not present

## 2023-07-02 DIAGNOSIS — L97319 Non-pressure chronic ulcer of right ankle with unspecified severity: Secondary | ICD-10-CM | POA: Diagnosis not present

## 2023-07-02 DIAGNOSIS — L97919 Non-pressure chronic ulcer of unspecified part of right lower leg with unspecified severity: Secondary | ICD-10-CM | POA: Diagnosis not present

## 2023-07-03 DIAGNOSIS — H9201 Otalgia, right ear: Secondary | ICD-10-CM | POA: Diagnosis not present

## 2023-07-05 ENCOUNTER — Other Ambulatory Visit

## 2023-07-06 ENCOUNTER — Other Ambulatory Visit

## 2023-07-09 DIAGNOSIS — L97919 Non-pressure chronic ulcer of unspecified part of right lower leg with unspecified severity: Secondary | ICD-10-CM | POA: Diagnosis not present

## 2023-07-09 DIAGNOSIS — I83018 Varicose veins of right lower extremity with ulcer other part of lower leg: Secondary | ICD-10-CM | POA: Diagnosis not present

## 2023-07-09 DIAGNOSIS — L97319 Non-pressure chronic ulcer of right ankle with unspecified severity: Secondary | ICD-10-CM | POA: Diagnosis not present

## 2023-07-12 ENCOUNTER — Other Ambulatory Visit (HOSPITAL_COMMUNITY): Payer: Self-pay

## 2023-07-16 DIAGNOSIS — L97319 Non-pressure chronic ulcer of right ankle with unspecified severity: Secondary | ICD-10-CM | POA: Diagnosis not present

## 2023-07-16 DIAGNOSIS — L97919 Non-pressure chronic ulcer of unspecified part of right lower leg with unspecified severity: Secondary | ICD-10-CM | POA: Diagnosis not present

## 2023-07-16 DIAGNOSIS — I83018 Varicose veins of right lower extremity with ulcer other part of lower leg: Secondary | ICD-10-CM | POA: Diagnosis not present

## 2023-07-18 ENCOUNTER — Inpatient Hospital Stay: Admission: RE | Admit: 2023-07-18 | Source: Ambulatory Visit

## 2023-07-18 ENCOUNTER — Other Ambulatory Visit (HOSPITAL_COMMUNITY): Payer: Self-pay

## 2023-07-20 ENCOUNTER — Other Ambulatory Visit: Payer: Self-pay

## 2023-07-20 DIAGNOSIS — D473 Essential (hemorrhagic) thrombocythemia: Secondary | ICD-10-CM

## 2023-07-23 ENCOUNTER — Telehealth: Payer: Self-pay | Admitting: Cardiovascular Disease

## 2023-07-23 ENCOUNTER — Inpatient Hospital Stay

## 2023-07-23 ENCOUNTER — Inpatient Hospital Stay: Attending: Hematology | Admitting: Hematology

## 2023-07-23 VITALS — BP 180/66 | HR 74 | Temp 98.1°F | Resp 18 | Ht 64.0 in | Wt 116.8 lb

## 2023-07-23 DIAGNOSIS — D473 Essential (hemorrhagic) thrombocythemia: Secondary | ICD-10-CM | POA: Insufficient documentation

## 2023-07-23 DIAGNOSIS — M7989 Other specified soft tissue disorders: Secondary | ICD-10-CM | POA: Diagnosis not present

## 2023-07-23 DIAGNOSIS — Z7982 Long term (current) use of aspirin: Secondary | ICD-10-CM | POA: Diagnosis not present

## 2023-07-23 DIAGNOSIS — D509 Iron deficiency anemia, unspecified: Secondary | ICD-10-CM | POA: Insufficient documentation

## 2023-07-23 DIAGNOSIS — I739 Peripheral vascular disease, unspecified: Secondary | ICD-10-CM | POA: Diagnosis not present

## 2023-07-23 LAB — IRON AND IRON BINDING CAPACITY (CC-WL,HP ONLY)
Iron: 57 ug/dL (ref 28–170)
Saturation Ratios: 20 % (ref 10.4–31.8)
TIBC: 291 ug/dL (ref 250–450)
UIBC: 234 ug/dL (ref 148–442)

## 2023-07-23 LAB — CMP (CANCER CENTER ONLY)
ALT: 10 U/L (ref 0–44)
AST: 21 U/L (ref 15–41)
Albumin: 4 g/dL (ref 3.5–5.0)
Alkaline Phosphatase: 53 U/L (ref 38–126)
Anion gap: 3 — ABNORMAL LOW (ref 5–15)
BUN: 11 mg/dL (ref 8–23)
CO2: 32 mmol/L (ref 22–32)
Calcium: 9.7 mg/dL (ref 8.9–10.3)
Chloride: 104 mmol/L (ref 98–111)
Creatinine: 0.9 mg/dL (ref 0.44–1.00)
GFR, Estimated: >60 mL/min (ref >60)
Glucose, Bld: 77 mg/dL (ref 70–99)
Potassium: 4.2 mmol/L (ref 3.5–5.1)
Sodium: 139 mmol/L (ref 135–145)
Total Bilirubin: 0.6 mg/dL (ref 0.0–1.2)
Total Protein: 6.8 g/dL (ref 6.5–8.1)

## 2023-07-23 LAB — CBC WITH DIFFERENTIAL (CANCER CENTER ONLY)
Abs Immature Granulocytes: 0.01 10*3/uL (ref 0.00–0.07)
Basophils Absolute: 0 10*3/uL (ref 0.0–0.1)
Basophils Relative: 1 %
Eosinophils Absolute: 0.1 10*3/uL (ref 0.0–0.5)
Eosinophils Relative: 1 %
HCT: 31.3 % — ABNORMAL LOW (ref 36.0–46.0)
Hemoglobin: 10.6 g/dL — ABNORMAL LOW (ref 12.0–15.0)
Immature Granulocytes: 0 %
Lymphocytes Relative: 33 %
Lymphs Abs: 1.4 10*3/uL (ref 0.7–4.0)
MCH: 32.6 pg (ref 26.0–34.0)
MCHC: 33.9 g/dL (ref 30.0–36.0)
MCV: 96.3 fL (ref 80.0–100.0)
Monocytes Absolute: 0.4 10*3/uL (ref 0.1–1.0)
Monocytes Relative: 9 %
Neutro Abs: 2.5 10*3/uL (ref 1.7–7.7)
Neutrophils Relative %: 56 %
Platelet Count: 291 10*3/uL (ref 150–400)
RBC: 3.25 MIL/uL — ABNORMAL LOW (ref 3.87–5.11)
RDW: 17.9 % — ABNORMAL HIGH (ref 11.5–15.5)
WBC Count: 4.4 10*3/uL (ref 4.0–10.5)
nRBC: 0 % (ref 0.0–0.2)

## 2023-07-23 LAB — VITAMIN B12: Vitamin B-12: 243 pg/mL (ref 180–914)

## 2023-07-23 NOTE — Telephone Encounter (Signed)
 Spoke with husband per DPR and he states patient has a wound on her leg. The wound provider did a scan on her legs and stated she had blockages in her legs. They sent over the results. He would like for you review and provide next steps.

## 2023-07-23 NOTE — Telephone Encounter (Signed)
 Spoke with pt's husband, Arvel Lather (ok per DPR). Patient identification verified by 2 forms. Per dopplers done back in July 2024 pt had occluded right SFA and popliteal arteries. At this time pt has asymptomatic and didn't have a wound, we chose to monitor on and outpatient basis. In light of pt having a wound now, she will need to be seen back in the office in the next week. Office visit scheduled for pt on Wednesday, May 7th at 3:15pm. Margrett Sherry understanding and has no further questions as this time.

## 2023-07-23 NOTE — Telephone Encounter (Signed)
 Husband says patient recently saw wound doctor at [facility] + had a scan and they saw blockages on right leg. Husband would like a call back to discuss.

## 2023-07-24 LAB — FERRITIN: Ferritin: 414 ng/mL — ABNORMAL HIGH (ref 11–307)

## 2023-07-25 ENCOUNTER — Telehealth: Payer: Self-pay | Admitting: Hematology

## 2023-07-25 ENCOUNTER — Encounter: Payer: Self-pay | Admitting: Cardiovascular Disease

## 2023-07-25 ENCOUNTER — Ambulatory Visit: Admitting: Cardiovascular Disease

## 2023-07-25 ENCOUNTER — Ambulatory Visit
Admission: RE | Admit: 2023-07-25 | Discharge: 2023-07-25 | Disposition: A | Discharge status: Home / Self Care | Source: Ambulatory Visit | Attending: Hematology | Admitting: Hematology

## 2023-07-25 VITALS — BP 174/97 | HR 72 | Ht 63.0 in | Wt 112.6 lb

## 2023-07-25 DIAGNOSIS — E785 Hyperlipidemia, unspecified: Secondary | ICD-10-CM | POA: Diagnosis not present

## 2023-07-25 DIAGNOSIS — I739 Peripheral vascular disease, unspecified: Secondary | ICD-10-CM | POA: Insufficient documentation

## 2023-07-25 DIAGNOSIS — I5042 Chronic combined systolic (congestive) and diastolic (congestive) heart failure: Secondary | ICD-10-CM | POA: Insufficient documentation

## 2023-07-25 DIAGNOSIS — I10 Essential (primary) hypertension: Secondary | ICD-10-CM | POA: Diagnosis not present

## 2023-07-25 DIAGNOSIS — M7989 Other specified soft tissue disorders: Secondary | ICD-10-CM | POA: Diagnosis not present

## 2023-07-25 NOTE — Telephone Encounter (Signed)
 Left patient a vm regarding upcoming appointment

## 2023-07-25 NOTE — Patient Instructions (Signed)
 Medication Instructions:  Your physician recommends that you continue on your current medications as directed. Please refer to the Current Medication list given to you today.  *If you need a refill on your cardiac medications before your next appointment, please call your pharmacy*  Testing/Procedures: Your physician has requested that you have a lower extremity arterial duplex. During this test, ultrasound is used to evaluate arterial blood flow in the legs. Allow one hour for this exam. There are no restrictions or special instructions. This will take place at [address] 4th floor.  **To do in July**  Please note: We ask at that you not bring children with you during ultrasound (echo/ vascular) testing. Due to room size and safety concerns, children are not allowed in the ultrasound rooms during exams. Our front office staff cannot provide observation of children in our lobby area while testing is being conducted. An adult accompanying a patient to their appointment will only be allowed in the ultrasound room at the discretion of the ultrasound technician under special circumstances. We apologize for any inconvenience.  Your physician has requested that you have an ankle brachial index (ABI). During this test an ultrasound and blood pressure cuff are used to evaluate the arteries that supply the arms and legs with blood. Allow thirty minutes for this exam. There are no restrictions or special instructions. This will take place at [address], 4th floor  **To do in July**   Please note: We ask at that you not bring children with you during ultrasound (echo/ vascular) testing. Due to room size and safety concerns, children are not allowed in the ultrasound rooms during exams. Our front office staff cannot provide observation of children in our lobby area while testing is being conducted. An adult accompanying a patient to their appointment will only be allowed in the ultrasound room at the  discretion of the ultrasound technician under special circumstances. We apologize for any inconvenience.   Follow-Up: At [facility], you and your health needs are our priority.  As part of our continuing mission to provide you with exceptional heart care, our providers are all part of one team.  This team includes your primary Cardiologist (physician) and Advanced Practice Providers or APPs (Physician Assistants and Nurse Practitioners) who all work together to provide you with the care you need, when you need it.  Your next appointment:   4 month(s)  Provider:   Lauro Portal, MD    We recommend signing up for the patient portal called "MyChart".  Sign up information is provided on this After Visit Summary.  MyChart is used to connect with patients for Virtual Visits (Telemedicine).  Patients are able to view lab/test results, encounter notes, upcoming appointments, etc.  Non-urgent messages can be sent to your provider as well.   To learn more about what you can do with MyChart, go to [email or web address].

## 2023-07-25 NOTE — Progress Notes (Signed)
 07/25/2023 Anna [name]   March 12, 1951  [id number]  Primary Physician Anna Broad, MD Primary Cardiologist: Anna Leigh MD Bennye Bravo, [place]  HPI:  Anna [name] is a 73 y.o.   mildly overweight married Caucasian female mother of 2, grandmother of 3 grandchildren who is accompanied by her husband Anna [name] today.  She is a retired Interior and spatial designer.  She was referred by her cardiologist, Dr.Chandrasekhar for evaluation of PAD.  I last saw her in the office 08/23/2022.  She has a history of hypertension and diabetes.  She recently had LAD and circumflex obtuse marginal branch stenting by Dr. Addie Holstein 07/27/2020.  She has chronic renal insufficiency (CKD 3B) Doppler studies performed 08/11/2020 revealed a right ABI of 0.96 and a left of 0.99.  She had a high-frequency signal in her mid and distal right SFA and moderate disease in her left.  She does complain of right greater than left lower extremity lifestyle limiting claudication.  Unfortunately, because of her renal insufficiency, the decision to perform angiography especially diabetic becomes more complicated.   Since I saw her a year ago she is complaining of sciatica type pain when she is lying down, sitting down and standing up.  She also complains of pain in her feet which is probably related to diabetic peripheral neuropathy.  She really denies claudication symptoms.  She had trauma to her right shin on Christmas day of last year and now has a wound that is being treated as per wound care center.  It is slowly healing.  When I saw her a year ago her serum creatinine was 1.7 but today its normal.  Given the fact that the wounds are healing I think we can wait to decide whether or not to pursue endovascular therapy.    Current Meds  Medication Sig   acetaminophen  (TYLENOL ) 325 MG tablet Take 325 mg by mouth 3 (three) times daily as needed for moderate pain or mild pain.   amLODipine  (NORVASC ) 5 MG tablet Take 1 tablet (5 mg total) by  mouth daily.   aspirin  EC 81 MG tablet Take 81 mg by mouth daily. Swallow whole.   atorvastatin  (LIPITOR) 40 MG tablet Take 1 tablet (40 mg total) by mouth daily.   BELBUCA 600 MCG FILM Take 1 Film by mouth 2 (two) times daily.   BUTRANS 15 MCG/HR Place 1 patch onto the skin once a week.   Glucosamine HCl 1000 MG TABS daily. Take one tab daily po   hydrALAZINE  (APRESOLINE ) 50 MG tablet Take 1 tablet (50 mg total) by mouth 2 (two) times daily.   hydroxyurea  (HYDREA ) 500 MG capsule TAKE 1 CAPSULE BY MOUTH ONCE DAILY WITH A MEAL   metoprolol  succinate (TOPROL -XL) 50 MG 24 hr tablet Take 1 tablet (50 mg total) by mouth daily. Take with or immediately following a meal.   Probiotic Product (PHILLIPS COLON HEALTH) CAPS daily.   tirzepatide  (MOUNJARO ) 12.5 MG/0.5ML Pen Inject 12.5 mg into the skin once a week.   trimethoprim (TRIMPEX) 100 MG tablet Take 100 mg by mouth at bedtime.   [DISCONTINUED] atorvastatin  (LIPITOR) 40 MG tablet Take 40 mg by mouth daily.    [DISCONTINUED] tirzepatide  (MOUNJARO ) 12.5 MG/0.5ML Pen Inject 12.5 mg into the skin once a week.     Allergies  Allergen Reactions   Antihistamines, Diphenhydramine-Type Other (See Comments)    unknown   Simvastatin Other (See Comments)    unknown   Benadryl [Diphenhydramine Hcl] Other (See Comments)  chills    Social History   Socioeconomic History   Marital status: Married    Spouse name: Anna [name]   Number of children: 2   Years of education: 12 +   Highest education level: Associate degree: occupational, Scientist, product/process development, or vocational program  Occupational History   Occupation: retired  Tobacco Use   Smoking status: Former    Current packs/day: 0.00    Average packs/day: 0.2 packs/day for 56.0 years (8.4 ttl pk-yrs)    Types: Cigarettes    Start date: 06/18/1964    Quit date: 06/18/2020    Years since quitting: 3.1   Smokeless tobacco: Never   Tobacco comments:    smokes 3/day.   Vaping Use   Vaping status: Never Used   Substance and Sexual Activity   Alcohol use: Never   Drug use: No   Sexual activity: Not on file  Other Topics Concern   Not on file  Social History Narrative   ** Merged History Encounter **       Lives with husband No caffeine   Social Drivers of Corporate investment banker Strain: Low Risk  (07/02/2020)   Overall Financial Resource Strain (CARDIA)    Difficulty of Paying Living Expenses: Not very hard  Food Insecurity: No Food Insecurity (07/02/2020)   Hunger Vital Sign    Worried About Running Out of Food in the Last Year: Never true    Ran Out of Food in the Last Year: Never true  Transportation Needs: No Transportation Needs (07/02/2020)   PRAPARE - Administrator, Civil Service (Medical): No    Lack of Transportation (Non-Medical): No  Physical Activity: Not on file  Stress: Not on file  Social Connections: Not on file  Intimate Partner Violence: Not on file     Review of Systems: General: negative for chills, fever, night sweats or weight changes.  Cardiovascular: negative for chest pain, dyspnea on exertion, edema, orthopnea, palpitations, paroxysmal nocturnal dyspnea or shortness of breath Dermatological: negative for rash Respiratory: negative for cough or wheezing Urologic: negative for hematuria Abdominal: negative for nausea, vomiting, diarrhea, bright red blood per rectum, melena, or hematemesis Neurologic: negative for visual changes, syncope, or dizziness All other systems reviewed and are otherwise negative except as noted above.    Blood pressure (!) 174/97, pulse 72, height 5\' 3"  (1.6 m), weight 112 lb 9.6 oz (51.1 kg), SpO2 100%.  General appearance: alert and no distress Neck: no adenopathy, no carotid bruit, no JVD, supple, symmetrical, trachea midline, and thyroid not enlarged, symmetric, no tenderness/mass/nodules Lungs: clear to auscultation bilaterally Heart: regular rate and rhythm, S1, S2 normal, no murmur, click, rub or  gallop Extremities: extremities normal, atraumatic, no cyanosis or edema Pulses: Absent pedal pulses Skin: Bandaged wound right shin Neurologic: Grossly normal  EKG EKG Interpretation Date/Time:  Wednesday Jul 25 2023 15:57:40 EDT Ventricular Rate:  71 PR Interval:  396 QRS Duration:  82 QT Interval:  392 QTC Calculation: 425 R Axis:   72  Text Interpretation: Atrial-paced rhythm with prolonged AV conduction Low voltage QRS When compared with ECG of 24-Oct-2020 11:55, Electronic atrial 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 Segrist was referred to me by Dr.Chandrasekhar for evaluation of PAD.  When I saw her a year ago her serum creatinine was in the  1.7 range.  Today its normal.  Her Doppler studies performed 09/18/2022 did show right ABI 0.58, left 0.82.  She had an occluded right SFA with an occluded anterior tibial and peroneal artery on the right and high-grade disease in the distal left SFA and popliteal artery with occluded AT and PT.  She really was not complaining of claudication at that time but more back pain and foot pain probably related to diabetes.  She now has a wound on her right shin that occurred after trauma to her leg over Christmas last year.  She is seeing a wound care doctor in [place] and the wound is slowly healing.  At this point, I do not think there is a reason for revascularization given the fact that her wound is healing.  Her serum creatinine has improved and if her wound stops healing she would be a candidate for revascularization.  She denies claudication symptoms.     Anna Leigh MD FACP,FACC,FAHA, [facility] 07/25/2023 4:23 PM

## 2023-07-25 NOTE — Assessment & Plan Note (Signed)
 Anna [name] was referred to me by Dr.Chandrasekhar for evaluation of PAD.  When I saw her a year ago her serum creatinine was in the 1.7 range.  Today its normal.  Her Doppler studies performed 09/18/2022 did show right ABI 0.58, left 0.82.  She had an occluded right SFA with an occluded anterior tibial and peroneal artery on the right and high-grade disease in the distal left SFA and popliteal artery with occluded AT and PT.  She really was not complaining of claudication at that time but more back pain and foot pain probably related to diabetes.  She now has a wound on her right shin that occurred after trauma to her leg over Christmas last year.  She is seeing a wound care doctor in [place] and the wound is slowly healing.  At this point, I do not think there is a reason for revascularization given the fact that her wound is healing.  Her serum creatinine has improved and if her wound stops healing she would be a candidate for revascularization.  She denies claudication symptoms.
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
## 2023-07-30 ENCOUNTER — Encounter: Payer: Self-pay | Admitting: Hematology

## 2023-08-06 DIAGNOSIS — L97919 Non-pressure chronic ulcer of unspecified part of right lower leg with unspecified severity: Secondary | ICD-10-CM | POA: Diagnosis not present

## 2023-08-06 DIAGNOSIS — I83018 Varicose veins of right lower extremity with ulcer other part of lower leg: Secondary | ICD-10-CM | POA: Diagnosis not present

## 2023-08-08 DIAGNOSIS — Z9689 Presence of other specified functional implants: Secondary | ICD-10-CM | POA: Diagnosis not present

## 2023-08-08 DIAGNOSIS — M5416 Radiculopathy, lumbar region: Secondary | ICD-10-CM | POA: Diagnosis not present

## 2023-08-08 DIAGNOSIS — F1120 Opioid dependence, uncomplicated: Secondary | ICD-10-CM | POA: Diagnosis not present

## 2023-08-20 DIAGNOSIS — L97919 Non-pressure chronic ulcer of unspecified part of right lower leg with unspecified severity: Secondary | ICD-10-CM | POA: Diagnosis not present

## 2023-08-20 DIAGNOSIS — I83018 Varicose veins of right lower extremity with ulcer other part of lower leg: Secondary | ICD-10-CM | POA: Diagnosis not present

## 2023-08-21 DIAGNOSIS — I83018 Varicose veins of right lower extremity with ulcer other part of lower leg: Secondary | ICD-10-CM | POA: Diagnosis not present

## 2023-08-23 ENCOUNTER — Other Ambulatory Visit (HOSPITAL_COMMUNITY): Payer: Self-pay

## 2023-09-03 DIAGNOSIS — I83018 Varicose veins of right lower extremity with ulcer other part of lower leg: Secondary | ICD-10-CM | POA: Diagnosis not present

## 2023-09-03 DIAGNOSIS — L97919 Non-pressure chronic ulcer of unspecified part of right lower leg with unspecified severity: Secondary | ICD-10-CM | POA: Diagnosis not present

## 2023-09-11 DIAGNOSIS — M5416 Radiculopathy, lumbar region: Secondary | ICD-10-CM | POA: Diagnosis not present

## 2023-09-17 DIAGNOSIS — L97919 Non-pressure chronic ulcer of unspecified part of right lower leg with unspecified severity: Secondary | ICD-10-CM | POA: Diagnosis not present

## 2023-09-17 DIAGNOSIS — I83018 Varicose veins of right lower extremity with ulcer other part of lower leg: Secondary | ICD-10-CM | POA: Diagnosis not present

## 2023-09-25 ENCOUNTER — Ambulatory Visit (HOSPITAL_COMMUNITY)

## 2023-09-25 ENCOUNTER — Ambulatory Visit (HOSPITAL_COMMUNITY): Admission: RE | Admit: 2023-09-25 | Source: Ambulatory Visit

## 2023-10-01 DIAGNOSIS — L97919 Non-pressure chronic ulcer of unspecified part of right lower leg with unspecified severity: Secondary | ICD-10-CM | POA: Diagnosis not present

## 2023-10-01 DIAGNOSIS — I83018 Varicose veins of right lower extremity with ulcer other part of lower leg: Secondary | ICD-10-CM | POA: Diagnosis not present

## 2023-10-15 DIAGNOSIS — L97919 Non-pressure chronic ulcer of unspecified part of right lower leg with unspecified severity: Secondary | ICD-10-CM | POA: Diagnosis not present

## 2023-10-15 DIAGNOSIS — I83018 Varicose veins of right lower extremity with ulcer other part of lower leg: Secondary | ICD-10-CM | POA: Diagnosis not present

## 2023-11-01 ENCOUNTER — Encounter: Payer: Self-pay | Admitting: Cardiovascular Disease

## 2023-11-13 DIAGNOSIS — Z9689 Presence of other specified functional implants: Secondary | ICD-10-CM | POA: Diagnosis not present

## 2023-11-13 DIAGNOSIS — F1120 Opioid dependence, uncomplicated: Secondary | ICD-10-CM | POA: Diagnosis not present

## 2023-11-13 DIAGNOSIS — M5416 Radiculopathy, lumbar region: Secondary | ICD-10-CM | POA: Diagnosis not present

## 2023-11-14 ENCOUNTER — Ambulatory Visit (HOSPITAL_COMMUNITY)
Admission: RE | Admit: 2023-11-14 | Discharge: 2023-11-14 | Disposition: A | Discharge status: Home / Self Care | Source: Ambulatory Visit | Attending: Cardiovascular Disease | Admitting: Cardiovascular Disease

## 2023-11-14 ENCOUNTER — Ambulatory Visit: Payer: Self-pay | Admitting: Cardiovascular Disease

## 2023-11-14 DIAGNOSIS — I739 Peripheral vascular disease, unspecified: Secondary | ICD-10-CM | POA: Insufficient documentation

## 2023-11-18 LAB — VAS US ABI WITH/WO TBI
Left ABI: 0.48
Right ABI: 0.54

## 2023-11-26 ENCOUNTER — Ambulatory Visit: Attending: Cardiovascular Disease | Admitting: Cardiovascular Disease

## 2023-11-26 ENCOUNTER — Encounter: Payer: Self-pay | Admitting: Cardiovascular Disease

## 2023-11-26 VITALS — BP 150/80 | HR 65 | Ht 64.0 in | Wt 114.0 lb

## 2023-11-26 DIAGNOSIS — R0989 Other specified symptoms and signs involving the circulatory and respiratory systems: Secondary | ICD-10-CM

## 2023-11-26 DIAGNOSIS — I739 Peripheral vascular disease, unspecified: Secondary | ICD-10-CM | POA: Diagnosis not present

## 2023-11-26 NOTE — Assessment & Plan Note (Addendum)
 Patient was initially referred for PAD.  She had a wound on her right shin which has since healed after seeing the wound care center and [facility].  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  She denies symptoms of claudication.  She does have back pain rating to her buttocks which sounds neurogenic.  I have asked her to discuss this with her PCP.  Her serum creatinine has normalized.  Currently, I do not feel she needs an peripheral angiography and endovascular therapy given her lack of claudication symptoms.  Her most recent Dopplers show an occluded right SFA which is chronic and high-grade left popliteal artery stenosis with a decline in her left ABI.

## 2023-11-26 NOTE — Progress Notes (Signed)
 11/26/2023 Anna [name]   [birth year]  [id number]  Primary Physician Yolande Toribio MATSU, MD Primary Cardiologist: Dorn JINNY Lesches MD [facility] [place], [place]  HPI:  Anna [name] is a 73 y.o.   mildly overweight married Caucasian female mother of 2, grandmother of 3 grandchildren who is accompanied by her husband Lael today.  She is a retired Interior and spatial designer.  She was referred by her cardiologist, Dr.Chandrasekhar for evaluation of PAD.  I last saw her in the office 07/25/2023.  She has a history of hypertension and diabetes.  She recently had LAD and circumflex obtuse marginal branch stenting by Dr. Anner 07/27/2020.  She has chronic renal insufficiency (CKD 3B) Doppler studies performed 08/11/2020 revealed a right ABI of 0.96 and a left of 0.99.  She had a high-frequency signal in her mid and distal right SFA and moderate disease in her left.  She does complain of right greater than left lower extremity lifestyle limiting claudication.  Unfortunately, because of her renal insufficiency, the decision to perform angiography especially diabetic becomes more complicated.   She is complaining of sciatica type pain when she is lying down, sitting down and standing up.  She also complains of pain in her feet which is probably related to diabetic peripheral neuropathy.  She really denies claudication symptoms.  She had trauma to her right shin on Christmas day of last year and now has a wound that is being treated as per wound care center.  It has since healed.  When I saw her a year ago her serum creatinine was 1.7 but today its normal  Since I saw her 4 months ago the wound on her right calf is healed.  She continues to complain of symptoms compatible with diabetic peripheral neuropathy and symptoms in her back and buttock which sounds to be neurogenic.  Her symptoms importantly do not sound like claudication.     Current Meds  Medication Sig   acetaminophen  (TYLENOL ) 325 MG tablet Take 325 mg by  mouth 3 (three) times daily as needed for moderate pain or mild pain.   amLODipine  (NORVASC ) 5 MG tablet Take 1 tablet (5 mg total) by mouth daily.   aspirin  EC 81 MG tablet Take 81 mg by mouth daily. Swallow whole.   atorvastatin  (LIPITOR) 40 MG tablet Take 1 tablet (40 mg total) by mouth daily.   BELBUCA 600 MCG FILM Take 1 Film by mouth 2 (two) times daily.   Glucosamine HCl 1000 MG TABS daily. Take one tab daily po   hydrALAZINE  (APRESOLINE ) 50 MG tablet Take 1 tablet (50 mg total) by mouth 2 (two) times daily.   hydroxyurea  (HYDREA ) 500 MG capsule TAKE 1 CAPSULE BY MOUTH ONCE DAILY WITH A MEAL   metoprolol  succinate (TOPROL -XL) 50 MG 24 hr tablet Take 1 tablet (50 mg total) by mouth daily. Take with or immediately following a meal.   Probiotic Product (PHILLIPS COLON HEALTH) CAPS daily.   tirzepatide  (MOUNJARO ) 12.5 MG/0.5ML Pen Inject 12.5 mg into the skin once a week.   trimethoprim (TRIMPEX) 100 MG tablet Take 100 mg by mouth at bedtime.     Allergies  Allergen Reactions   Antihistamines, Diphenhydramine-Type Other (See Comments)    unknown   Simvastatin Other (See Comments)    unknown   Benadryl [Diphenhydramine Hcl] Other (See Comments)    chills    Social History   Socioeconomic History   Marital status: Married    Spouse name: Kemper Hochman  Number of children: 2   Years of education: 12 +   Highest education level: Associate degree: occupational, Scientist, product/process development, or vocational program  Occupational History   Occupation: retired  Tobacco Use   Smoking status: Former    Current packs/day: 0.00    Average packs/day: 0.2 packs/day for 56.0 years (8.4 ttl pk-yrs)    Types: Cigarettes    Start date: 06/18/1964    Quit date: 06/18/2020    Years since quitting: 3.4   Smokeless tobacco: Never   Tobacco comments:    smokes 3/day.   Vaping Use   Vaping status: Never Used  Substance and Sexual Activity   Alcohol  use: Never   Drug use: No   Sexual activity: Not on file  Other  Topics Concern   Not on file  Social History Narrative   ** Merged History Encounter **       Lives with husband No caffeine   Social Drivers of Corporate investment banker Strain: Low Risk  (07/02/2020)   Overall Financial Resource Strain (CARDIA)    Difficulty of Paying Living Expenses: Not very hard  Food Insecurity: No Food Insecurity (07/02/2020)   Hunger Vital Sign    Worried About Running Out of Food in the Last Year: Never true    Ran Out of Food in the Last Year: Never true  Transportation Needs: No Transportation Needs (07/02/2020)   PRAPARE - Administrator, Civil Service (Medical): No    Lack of Transportation (Non-Medical): No  Physical Activity: Not on file  Stress: Not on file  Social Connections: Not on file  Intimate Partner Violence: Not on file     Review of Systems: General: negative for chills, fever, night sweats or weight changes.  Cardiovascular: negative for chest pain, dyspnea on exertion, edema, orthopnea, palpitations, paroxysmal nocturnal dyspnea or shortness of breath Dermatological: negative for rash Respiratory: negative for cough or wheezing Urologic: negative for hematuria Abdominal: negative for nausea, vomiting, diarrhea, bright red blood per rectum, melena, or hematemesis Neurologic: negative for visual changes, syncope, or dizziness All other systems reviewed and are otherwise negative except as noted above.    Blood pressure (!) 150/80, pulse 65, height 5' 4 (1.626 m), weight 114 lb (51.7 kg), SpO2 95%.  General appearance: alert and no distress Neck: no adenopathy, no JVD, supple, symmetrical, trachea midline, thyroid not enlarged, symmetric, no tenderness/mass/nodules, and bilateral carotid bruits Lungs: clear to auscultation bilaterally Heart: Soft outflow tract murmur consistent with aortic stenosis and/or sclerosis Extremities: extremities normal, atraumatic, no cyanosis or edema Pulses: Absent pedal pulses Skin: Skin  color, texture, turgor normal. No rashes or lesions Neurologic: Grossly normal  EKG not performed today      ASSESSMENT AND PLAN:   PAD (peripheral artery disease) (HCC) Patient was initially referred for PAD.  She had a wound on her right shin which has since healed after seeing the wound care center and [facility].  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  She denies symptoms of claudication.  She does have back pain rating to her buttocks which sounds neurogenic.  I have asked her to discuss this with her PCP.  Her serum creatinine has normalized.  Currently, I do not feel she needs an peripheral angiography and endovascular therapy given her lack of claudication symptoms.     Dorn DOROTHA Lesches MD FACP,FACC,FAHA, [facility] 11/26/2023 3:59 PM

## 2023-11-26 NOTE — Patient Instructions (Addendum)
 Medication Instructions:  Your physician recommends that you continue on your current medications as directed. Please refer to the Current Medication list given to you today.  *If you need a refill on your cardiac medications before your next appointment, please call your pharmacy*    Testing/Procedures: Your physician has requested that you have a carotid duplex. This test is an ultrasound of the carotid arteries in your neck. It looks at blood flow through these arteries that supply the brain with blood. Allow one hour for this exam. There are no restrictions or special instructions. This will take place at [address], 4th floor  Please note: We ask at that you not bring children with you during ultrasound (echo/ vascular) testing. Due to room size and safety concerns, children are not allowed in the ultrasound rooms during exams. Our front office staff cannot provide observation of children in our lobby area while testing is being conducted. An adult accompanying a patient to their appointment will only be allowed in the ultrasound room at the discretion of the ultrasound technician under special circumstances. We apologize for any inconvenience.   Follow-Up: At [facility], you and your health needs are our priority.  As part of our continuing mission to provide you with exceptional heart care, our providers are all part of one team.  This team includes your primary Cardiologist (physician) and Advanced Practice Providers or APPs (Physician Assistants and Nurse Practitioners) who all work together to provide you with the care you need, when you need it.  Your next appointment:   We will see you on an as needed basis  Provider:   Dorn Lesches, MD

## 2023-11-27 DIAGNOSIS — R350 Frequency of micturition: Secondary | ICD-10-CM | POA: Diagnosis not present

## 2023-11-27 DIAGNOSIS — N390 Urinary tract infection, site not specified: Secondary | ICD-10-CM | POA: Diagnosis not present

## 2023-11-27 DIAGNOSIS — E1151 Type 2 diabetes mellitus with diabetic peripheral angiopathy without gangrene: Secondary | ICD-10-CM | POA: Diagnosis not present

## 2023-11-27 DIAGNOSIS — E785 Hyperlipidemia, unspecified: Secondary | ICD-10-CM | POA: Diagnosis not present

## 2023-12-13 DIAGNOSIS — M5416 Radiculopathy, lumbar region: Secondary | ICD-10-CM | POA: Diagnosis not present

## 2023-12-17 NOTE — Progress Notes (Unsigned)
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 EF - NYHA class II, Stage B, hypervolemic, etiology from ischemia HTN with DM CKD Stage IIIb - Prescribe Lasix  20 mg every other day to manage fluid retention - Order basic natriuretic peptide test in two weeks to assess fluid status - Order echocardiogram in November to evaluate heart function - She does not know what medications she is taking- needs med rec - has not taken medications in two days, discussed at length with her and husband - discussed dietary changes and solutions  Dizziness and recurrent falls Recurrent dizziness and falls, particularly with positional changes, suggestive of vertigo. Ongoing for over a year. - Discuss vestibular rehabilitation as a potential treatment for dizziness  Longitudinal care: The evaluation and management services provided today reflect the complexity inherent in caring  for this patient, including the ongoing longitudinal relationship and management of multiple chronic conditions and/or the need for care coordination. The visit required a comprehensive assessment and management plan tailored to the patient's unique needs Time was spent addressing not only the acute concerns but also the broader context of the patient's health, including preventive care, chronic disease management, and care coordination as appropriate.  Complex longitudinal is necessary for conditions including: complex HTN, volume status and CKD while supporting her from her needed orthopedic interventions

## 2023-12-18 ENCOUNTER — Ambulatory Visit: Attending: Internal Medicine | Admitting: Internal Medicine

## 2023-12-18 VITALS — BP 190/73 | HR 54 | Ht 64.0 in | Wt 110.4 lb

## 2023-12-18 DIAGNOSIS — I5020 Unspecified systolic (congestive) heart failure: Secondary | ICD-10-CM

## 2023-12-18 DIAGNOSIS — R011 Cardiac murmur, unspecified: Secondary | ICD-10-CM

## 2023-12-18 DIAGNOSIS — M7989 Other specified soft tissue disorders: Secondary | ICD-10-CM

## 2023-12-18 DIAGNOSIS — I5032 Chronic diastolic (congestive) heart failure: Secondary | ICD-10-CM

## 2023-12-18 MED ORDER — FUROSEMIDE 20 MG PO TABS: 20.0000 mg | ORAL_TABLET | ORAL | 3 refills | Status: AC

## 2023-12-18 NOTE — Patient Instructions (Signed)
 Medication Instructions:  Your physician has recommended you make the following change in your medication:  START: Furosemide  20 mg by mouth once every other day  *If you need a refill on your cardiac medications before your next appointment, please call your pharmacy*  Lab Work: IN 2 WEEKS at [facility]: BMP, BNP, Fasting lipid panel  If you have labs (blood work) drawn today and your tests are completely normal, you will receive your results only by: MyChart Message (if you have MyChart) OR A paper copy in the mail If you have any lab test that is abnormal or we need to change your treatment, we will call you to review the results.  Testing/Procedures: NOV 2025 - - - Your physician has requested that you have an echocardiogram. Echocardiography is a painless test that uses sound waves to create images of your heart. It provides your doctor with information about the size and shape of your heart and how well your heart's chambers and valves are working. This procedure takes approximately one hour. There are no restrictions for this procedure. Please do NOT wear cologne, perfume, aftershave, or lotions (deodorant is allowed). Please arrive 15 minutes prior to your appointment time.  Please note: We ask at that you not bring children with you during ultrasound (echo/ vascular) testing. Due to room size and safety concerns, children are not allowed in the ultrasound rooms during exams. Our front office staff cannot provide observation of children in our lobby area while testing is being conducted. An adult accompanying a patient to their appointment will only be allowed in the ultrasound room at the discretion of the ultrasound technician under special circumstances. We apologize for any inconvenience.   Follow-Up: At [facility], you and your health needs are our priority.  As part of our continuing mission to provide you with exceptional heart care, our providers are all part of one  team.  This team includes your primary Cardiologist (physician) and Advanced Practice Providers or APPs (Physician Assistants and Nurse Practitioners) who all work together to provide you with the care you need, when you need it.  Your next appointment:   5 month(s)  Provider:   One of our Advanced Practice Providers (APPs): Morse Clause, PA-C  Lamarr Satterfield, NP Miriam Shams, NP  Olivia Pavy, PA-C Josefa Beauvais, NP  Leontine Salen, PA-C Orren Fabry, PA-C  [place], PA-C Ernest Dick, NP  Damien Braver, NP Jon Hails, PA-C  Waddell Donath, PA-C    Dayna Dunn, PA-C  Scott Weaver, PA-C Lum Louis, NP Katlyn West, NP Callie Goodrich, PA-C  Xika Zhao, NP Sheng Haley, PA-C    Kathleen Johnson, PA-C

## 2023-12-19 ENCOUNTER — Ambulatory Visit (HOSPITAL_COMMUNITY)
Admission: RE | Admit: 2023-12-19 | Discharge: 2023-12-19 | Disposition: A | Discharge status: Home / Self Care | Source: Ambulatory Visit | Attending: Cardiovascular Disease | Admitting: Cardiovascular Disease

## 2023-12-19 ENCOUNTER — Ambulatory Visit: Payer: Self-pay | Admitting: Cardiovascular Disease

## 2023-12-19 DIAGNOSIS — R0989 Other specified symptoms and signs involving the circulatory and respiratory systems: Secondary | ICD-10-CM

## 2023-12-19 DIAGNOSIS — I739 Peripheral vascular disease, unspecified: Secondary | ICD-10-CM | POA: Diagnosis not present

## 2023-12-31 DIAGNOSIS — I5032 Chronic diastolic (congestive) heart failure: Secondary | ICD-10-CM | POA: Diagnosis not present

## 2023-12-31 DIAGNOSIS — R011 Cardiac murmur, unspecified: Secondary | ICD-10-CM | POA: Diagnosis not present

## 2023-12-31 DIAGNOSIS — M7989 Other specified soft tissue disorders: Secondary | ICD-10-CM | POA: Diagnosis not present

## 2023-12-31 LAB — LIPID PANEL

## 2024-01-01 LAB — LIPID PANEL
Cholesterol, Total: 161 mg/dL (ref 100–199)
HDL: 72 mg/dL (ref >39)
LDL CALC COMMENT:: 2.2 ratio (ref 0.0–4.4)
LDL Chol Calc (NIH): 71 mg/dL (ref 0–99)
Triglycerides: 102 mg/dL (ref 0–149)
VLDL Cholesterol Cal: 18 mg/dL (ref 5–40)

## 2024-01-01 LAB — BASIC METABOLIC PANEL WITH GFR
BUN/Creatinine Ratio: 13 (ref 12–28)
BUN: 16 mg/dL (ref 8–27)
CO2: 25 mmol/L (ref 20–29)
Calcium: 10.4 mg/dL — ABNORMAL HIGH (ref 8.7–10.3)
Chloride: 101 mmol/L (ref 96–106)
Creatinine, Ser: 1.26 mg/dL — ABNORMAL HIGH (ref 0.57–1.00)
Glucose: 75 mg/dL (ref 70–99)
Potassium: 4.4 mmol/L (ref 3.5–5.2)
Sodium: 138 mmol/L (ref 134–144)
eGFR: 45 mL/min/1.73 — ABNORMAL LOW (ref >59)

## 2024-01-01 LAB — PRO B NATRIURETIC PEPTIDE: NT-Pro BNP: 550 pg/mL (ref 0–738)

## 2024-01-02 ENCOUNTER — Other Ambulatory Visit: Payer: Self-pay | Admitting: Hematology

## 2024-01-04 ENCOUNTER — Ambulatory Visit: Payer: Self-pay

## 2024-01-08 ENCOUNTER — Other Ambulatory Visit: Payer: Self-pay | Admitting: Internal Medicine

## 2024-01-08 DIAGNOSIS — I25118 Atherosclerotic heart disease of native coronary artery with other forms of angina pectoris: Secondary | ICD-10-CM

## 2024-01-20 ENCOUNTER — Telehealth: Payer: Self-pay | Admitting: Student in an Organized Health Care Education/Training Program

## 2024-01-20 NOTE — Telephone Encounter (Signed)
 Received two pages to the on-call cardiology pager from Mrs. Porras. She states that she has had more fluid retention in her legs these last few days. She estimates she has put on several pounds due to fluid. Her last ECHO was in 2023 (order placed for repeat echo as an outpatient) with revealed a normal LVEF with G1DD. She has known CKD with her last Cr in October of 1.2.    Recommendation: Increase lasix  to 40 mg twice daily the next 3 days and to follow up with her cardiologist.   Merlene Blood, MD MS  [facility]

## 2024-01-24 ENCOUNTER — Other Ambulatory Visit: Payer: Self-pay

## 2024-01-24 DIAGNOSIS — D473 Essential (hemorrhagic) thrombocythemia: Secondary | ICD-10-CM

## 2024-01-28 ENCOUNTER — Inpatient Hospital Stay: Attending: Hematology

## 2024-01-28 ENCOUNTER — Inpatient Hospital Stay: Admitting: Hematology

## 2024-01-28 VITALS — BP 155/49 | HR 63 | Temp 97.3°F | Resp 20 | Wt 111.0 lb

## 2024-01-28 DIAGNOSIS — D473 Essential (hemorrhagic) thrombocythemia: Secondary | ICD-10-CM | POA: Diagnosis not present

## 2024-01-28 DIAGNOSIS — D62 Acute posthemorrhagic anemia: Secondary | ICD-10-CM | POA: Insufficient documentation

## 2024-01-28 LAB — CBC WITH DIFFERENTIAL (CANCER CENTER ONLY)
Abs Immature Granulocytes: 0.01 K/uL (ref 0.00–0.07)
Basophils Absolute: 0.1 K/uL (ref 0.0–0.1)
Basophils Relative: 2 %
Eosinophils Absolute: 0.1 K/uL (ref 0.0–0.5)
Eosinophils Relative: 1 %
HCT: 33 % — ABNORMAL LOW (ref 36.0–46.0)
Hemoglobin: 11.1 g/dL — ABNORMAL LOW (ref 12.0–15.0)
Immature Granulocytes: 0 %
Lymphocytes Relative: 42 %
Lymphs Abs: 1.8 K/uL (ref 0.7–4.0)
MCH: 34.5 pg — ABNORMAL HIGH (ref 26.0–34.0)
MCHC: 33.6 g/dL (ref 30.0–36.0)
MCV: 102.5 fL — ABNORMAL HIGH (ref 80.0–100.0)
Monocytes Absolute: 0.5 K/uL (ref 0.1–1.0)
Monocytes Relative: 11 %
Neutro Abs: 1.9 K/uL (ref 1.7–7.7)
Neutrophils Relative %: 44 %
Platelet Count: 202 K/uL (ref 150–400)
RBC: 3.22 MIL/uL — ABNORMAL LOW (ref 3.87–5.11)
RDW: 15.8 % — ABNORMAL HIGH (ref 11.5–15.5)
WBC Count: 4.2 K/uL (ref 4.0–10.5)
nRBC: 0 % (ref 0.0–0.2)

## 2024-01-28 LAB — CMP (CANCER CENTER ONLY)
ALT: 21 U/L (ref 0–44)
AST: 32 U/L (ref 15–41)
Albumin: 4.1 g/dL (ref 3.5–5.0)
Alkaline Phosphatase: 71 U/L (ref 38–126)
Anion gap: 3 — ABNORMAL LOW (ref 5–15)
BUN: 24 mg/dL — ABNORMAL HIGH (ref 8–23)
CO2: 30 mmol/L (ref 22–32)
Calcium: 9.8 mg/dL (ref 8.9–10.3)
Chloride: 105 mmol/L (ref 98–111)
Creatinine: 1.26 mg/dL — ABNORMAL HIGH (ref 0.44–1.00)
GFR, Estimated: 45 mL/min — ABNORMAL LOW (ref >60)
Glucose, Bld: 76 mg/dL (ref 70–99)
Potassium: 4.7 mmol/L (ref 3.5–5.1)
Sodium: 138 mmol/L (ref 135–145)
Total Bilirubin: 0.5 mg/dL (ref 0.0–1.2)
Total Protein: 7 g/dL (ref 6.5–8.1)

## 2024-01-28 LAB — IRON AND IRON BINDING CAPACITY (CC-WL,HP ONLY)
Iron: 116 ug/dL (ref 28–170)
Saturation Ratios: 41 % — ABNORMAL HIGH (ref 10.4–31.8)
TIBC: 286 ug/dL (ref 250–450)
UIBC: 170 ug/dL (ref 148–442)

## 2024-01-28 LAB — FERRITIN: Ferritin: 384 ng/mL — ABNORMAL HIGH (ref 11–307)

## 2024-01-28 LAB — VITAMIN B12: Vitamin B-12: 615 pg/mL (ref 180–914)

## 2024-01-28 NOTE — Progress Notes (Signed)
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 extremity arterial disease. The left toe-brachial index is abnormal.  *See table(s) above for measurements and observations.  Electronically signed by Maude Emmer MD on 11/18/2023 at 8:44:29 PM.    Final        ASSESSMENT & PLAN:  73 y.o. 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 CMP stable.  Iron  Panel and Ferritin independently reviewed: Iron  %  41 and Ferritin 384.  Vitamin B12: 615  - Reviewed 12/19/2023 Carotid US  per patient request, but advised her to follow-up with her PCP regarding  this: Right Carotid: Velocities in the right ICA are consistent with a 1-39% stenosis.  Left Carotid: Velocities in the left ICA are consistent with a 40-59% stenosis.  Vertebrals:  Bilateral vertebral arteries demonstrate antegrade flow.  Subclavians: Normal flow hemodynamics were seen in bilateral subclavian arteries.   - May start reducing Hydroxyurea  as PLTs are improving.   Recommended taking Hydroxyurea  for 6 days and we will recheck labs in 6 months.   FOLLOW-UP in 6 months for labs and follow-up with Dr. Onesimo.  The total time spent  in the appointment was *** minutes* .  All of the patient's questions were answered and the patient knows to call the clinic with any problems, questions, or concerns.  Emaline Saran MD MS AAHIVMS [facility] Hematology/Oncology Physician [facility]  *Total Encounter Time as defined by the Centers for Medicare and Medicaid Services includes, in addition to the face-to-face time of a patient visit (documented in the note above) non-face-to-face time: obtaining and reviewing outside history, ordering and reviewing medications, tests or procedures, care coordination (communications with other health care professionals or caregivers) and documentation in the medical record.  I,Emily Lagle,acting as a neurosurgeon for Emaline Saran, MD.,have documented all relevant documentation on the behalf of Emaline Saran, MD,as directed by  Emaline Saran, MD while in the presence of Emaline Saran, MD.  I have reviewed the above documentation for accuracy and completeness, and I agree with the above.  Jakaiden Fill, MD

## 2024-02-04 ENCOUNTER — Encounter: Payer: Self-pay | Admitting: Hematology

## 2024-02-06 ENCOUNTER — Ambulatory Visit (HOSPITAL_COMMUNITY)
Admission: RE | Admit: 2024-02-06 | Discharge: 2024-02-06 | Disposition: A | Discharge status: Home / Self Care | Source: Ambulatory Visit | Attending: Cardiology | Admitting: Cardiology

## 2024-02-06 DIAGNOSIS — R011 Cardiac murmur, unspecified: Secondary | ICD-10-CM | POA: Diagnosis not present

## 2024-02-06 DIAGNOSIS — I5020 Unspecified systolic (congestive) heart failure: Secondary | ICD-10-CM | POA: Insufficient documentation

## 2024-02-06 DIAGNOSIS — M7989 Other specified soft tissue disorders: Secondary | ICD-10-CM | POA: Insufficient documentation

## 2024-02-06 LAB — ECHOCARDIOGRAM COMPLETE
Area-P 1/2: 3.25 cm2
S' Lateral: 1.8 cm

## 2024-02-20 ENCOUNTER — Encounter: Payer: Self-pay | Admitting: *Deleted

## 2024-02-20 NOTE — Progress Notes (Signed)
 Anna [name]                                          MRN: [id number]   02/20/2024   The [facility] Quality Team Specialist reviewed this patient medical record for the purposes of chart review for care gap closure. The following were reviewed: chart review for care gap closure-kidney health evaluation for diabetes:eGFR  and uACR.    [facility] Quality Team

## 2024-03-24 ENCOUNTER — Other Ambulatory Visit (HOSPITAL_COMMUNITY): Payer: Self-pay

## 2024-03-25 ENCOUNTER — Other Ambulatory Visit (HOSPITAL_COMMUNITY): Payer: Self-pay

## 2024-03-27 ENCOUNTER — Other Ambulatory Visit (HOSPITAL_COMMUNITY): Payer: Self-pay

## 2024-03-27 NOTE — Telephone Encounter (Signed)
 Left a message asking pt to contact our office.

## 2024-03-31 ENCOUNTER — Other Ambulatory Visit: Payer: Self-pay | Admitting: Hematology

## 2024-04-01 ENCOUNTER — Other Ambulatory Visit (HOSPITAL_COMMUNITY): Payer: Self-pay

## 2024-04-03 ENCOUNTER — Other Ambulatory Visit (HOSPITAL_COMMUNITY): Payer: Self-pay

## 2024-07-28 ENCOUNTER — Inpatient Hospital Stay

## 2024-07-28 ENCOUNTER — Inpatient Hospital Stay: Admitting: Hematology
